# Patient Record
Sex: Female | Born: 1935 | Race: White | Hispanic: No | Marital: Married | Smoking: Never smoker
Health system: Southern US, Community
[De-identification: ages and names within clinical notes are randomized; demographics above are authoritative.]

## PROBLEM LIST (undated history)

## (undated) DIAGNOSIS — H269 Unspecified cataract: Secondary | ICD-10-CM

## (undated) DIAGNOSIS — F32A Depression, unspecified: Secondary | ICD-10-CM

## (undated) DIAGNOSIS — F40240 Claustrophobia: Secondary | ICD-10-CM

## (undated) DIAGNOSIS — M1990 Unspecified osteoarthritis, unspecified site: Secondary | ICD-10-CM

## (undated) DIAGNOSIS — I10 Essential (primary) hypertension: Secondary | ICD-10-CM

## (undated) DIAGNOSIS — E785 Hyperlipidemia, unspecified: Secondary | ICD-10-CM

## (undated) DIAGNOSIS — R112 Nausea with vomiting, unspecified: Secondary | ICD-10-CM

## (undated) DIAGNOSIS — F329 Major depressive disorder, single episode, unspecified: Secondary | ICD-10-CM

## (undated) DIAGNOSIS — H409 Unspecified glaucoma: Secondary | ICD-10-CM

## (undated) DIAGNOSIS — S72009A Fracture of unspecified part of neck of unspecified femur, initial encounter for closed fracture: Secondary | ICD-10-CM

## (undated) DIAGNOSIS — Z9889 Other specified postprocedural states: Secondary | ICD-10-CM

## (undated) DIAGNOSIS — H9190 Unspecified hearing loss, unspecified ear: Secondary | ICD-10-CM

## (undated) DIAGNOSIS — C9000 Multiple myeloma not having achieved remission: Secondary | ICD-10-CM

## (undated) HISTORY — PX: HIP SURGERY: SHX245

## (undated) HISTORY — DX: Fracture of unspecified part of neck of unspecified femur, initial encounter for closed fracture: S72.009A

## (undated) HISTORY — DX: Unspecified cataract: H26.9

## (undated) HISTORY — DX: Multiple myeloma not having achieved remission: C90.00

---

## 1997-01-23 HISTORY — PX: ROTATOR CUFF REPAIR: SHX139

## 2004-04-28 ENCOUNTER — Ambulatory Visit: Payer: Self-pay | Admitting: Internal Medicine

## 2004-08-02 ENCOUNTER — Ambulatory Visit: Payer: Self-pay | Admitting: Unknown Physician Specialty

## 2005-05-01 ENCOUNTER — Ambulatory Visit: Payer: Self-pay | Admitting: Internal Medicine

## 2006-02-07 ENCOUNTER — Ambulatory Visit: Payer: Self-pay | Admitting: Internal Medicine

## 2006-05-15 ENCOUNTER — Ambulatory Visit: Payer: Self-pay | Admitting: Internal Medicine

## 2007-05-16 ENCOUNTER — Ambulatory Visit: Payer: Self-pay | Admitting: Internal Medicine

## 2008-05-18 ENCOUNTER — Ambulatory Visit: Payer: Self-pay | Admitting: Internal Medicine

## 2009-05-19 ENCOUNTER — Ambulatory Visit: Payer: Self-pay | Admitting: Internal Medicine

## 2010-05-25 ENCOUNTER — Ambulatory Visit: Payer: Self-pay | Admitting: Internal Medicine

## 2010-07-04 ENCOUNTER — Ambulatory Visit: Payer: Self-pay | Admitting: Unknown Physician Specialty

## 2012-07-23 DIAGNOSIS — Z8679 Personal history of other diseases of the circulatory system: Secondary | ICD-10-CM | POA: Insufficient documentation

## 2012-08-14 ENCOUNTER — Emergency Department (HOSPITAL_COMMUNITY): Payer: Medicare Other

## 2012-08-14 ENCOUNTER — Encounter (HOSPITAL_COMMUNITY): Payer: Self-pay | Admitting: Radiology

## 2012-08-14 ENCOUNTER — Inpatient Hospital Stay (HOSPITAL_COMMUNITY)
Admission: EM | Admit: 2012-08-14 | Discharge: 2012-08-20 | DRG: 987 | Disposition: A | Discharge status: Rehabilitation Facility | Payer: Medicare Other | Attending: General Surgery | Admitting: General Surgery

## 2012-08-14 DIAGNOSIS — S02109B Fracture of base of skull, unspecified side, initial encounter for open fracture: Secondary | ICD-10-CM

## 2012-08-14 DIAGNOSIS — S069XAA Unspecified intracranial injury with loss of consciousness status unknown, initial encounter: Secondary | ICD-10-CM

## 2012-08-14 DIAGNOSIS — I609 Nontraumatic subarachnoid hemorrhage, unspecified: Secondary | ICD-10-CM | POA: Diagnosis present

## 2012-08-14 DIAGNOSIS — S069X9A Unspecified intracranial injury with loss of consciousness of unspecified duration, initial encounter: Secondary | ICD-10-CM

## 2012-08-14 DIAGNOSIS — W19XXXA Unspecified fall, initial encounter: Secondary | ICD-10-CM

## 2012-08-14 DIAGNOSIS — H409 Unspecified glaucoma: Secondary | ICD-10-CM | POA: Diagnosis present

## 2012-08-14 DIAGNOSIS — IMO0002 Reserved for concepts with insufficient information to code with codable children: Secondary | ICD-10-CM | POA: Diagnosis present

## 2012-08-14 DIAGNOSIS — S0292XA Unspecified fracture of facial bones, initial encounter for closed fracture: Secondary | ICD-10-CM

## 2012-08-14 DIAGNOSIS — J95821 Acute postprocedural respiratory failure: Secondary | ICD-10-CM

## 2012-08-14 DIAGNOSIS — D72829 Elevated white blood cell count, unspecified: Secondary | ICD-10-CM | POA: Diagnosis present

## 2012-08-14 DIAGNOSIS — I619 Nontraumatic intracerebral hemorrhage, unspecified: Secondary | ICD-10-CM | POA: Diagnosis present

## 2012-08-14 DIAGNOSIS — S0180XA Unspecified open wound of other part of head, initial encounter: Secondary | ICD-10-CM

## 2012-08-14 DIAGNOSIS — S0010XA Contusion of unspecified eyelid and periocular area, initial encounter: Secondary | ICD-10-CM | POA: Diagnosis present

## 2012-08-14 DIAGNOSIS — S0280XA Fracture of other specified skull and facial bones, unspecified side, initial encounter for closed fracture: Principal | ICD-10-CM | POA: Diagnosis present

## 2012-08-14 DIAGNOSIS — D62 Acute posthemorrhagic anemia: Secondary | ICD-10-CM | POA: Diagnosis present

## 2012-08-14 DIAGNOSIS — S02109A Fracture of base of skull, unspecified side, initial encounter for closed fracture: Secondary | ICD-10-CM

## 2012-08-14 DIAGNOSIS — S0101XA Laceration without foreign body of scalp, initial encounter: Secondary | ICD-10-CM

## 2012-08-14 DIAGNOSIS — R259 Unspecified abnormal involuntary movements: Secondary | ICD-10-CM | POA: Diagnosis present

## 2012-08-14 DIAGNOSIS — E872 Acidosis: Secondary | ICD-10-CM

## 2012-08-14 DIAGNOSIS — R296 Repeated falls: Secondary | ICD-10-CM | POA: Diagnosis present

## 2012-08-14 DIAGNOSIS — S066X9A Traumatic subarachnoid hemorrhage with loss of consciousness of unspecified duration, initial encounter: Secondary | ICD-10-CM

## 2012-08-14 DIAGNOSIS — J9600 Acute respiratory failure, unspecified whether with hypoxia or hypercapnia: Secondary | ICD-10-CM | POA: Diagnosis not present

## 2012-08-14 DIAGNOSIS — S0292XB Unspecified fracture of facial bones, initial encounter for open fracture: Secondary | ICD-10-CM

## 2012-08-14 DIAGNOSIS — S066XAA Traumatic subarachnoid hemorrhage with loss of consciousness status unknown, initial encounter: Secondary | ICD-10-CM

## 2012-08-14 HISTORY — DX: Hyperlipidemia, unspecified: E78.5

## 2012-08-14 HISTORY — DX: Unspecified glaucoma: H40.9

## 2012-08-14 HISTORY — DX: Depression, unspecified: F32.A

## 2012-08-14 HISTORY — DX: Major depressive disorder, single episode, unspecified: F32.9

## 2012-08-14 HISTORY — DX: Other specified postprocedural states: Z98.890

## 2012-08-14 HISTORY — DX: Nausea with vomiting, unspecified: R11.2

## 2012-08-14 HISTORY — DX: Unspecified osteoarthritis, unspecified site: M19.90

## 2012-08-14 HISTORY — DX: Unspecified hearing loss, unspecified ear: H91.90

## 2012-08-14 HISTORY — DX: Claustrophobia: F40.240

## 2012-08-14 LAB — CBC WITH DIFFERENTIAL/PLATELET
Basophils Relative: 0 % (ref 0–1)
Eosinophils Absolute: 0.1 10*3/uL (ref 0.0–0.7)
Hemoglobin: 11.9 g/dL — ABNORMAL LOW (ref 12.0–15.0)
MCHC: 34.8 g/dL (ref 30.0–36.0)
Monocytes Relative: 7 % (ref 3–12)
Neutro Abs: 18.2 10*3/uL — ABNORMAL HIGH (ref 1.7–7.7)
Neutrophils Relative %: 83 % — ABNORMAL HIGH (ref 43–77)
Platelets: 281 10*3/uL (ref 150–400)
RBC: 3.72 MIL/uL — ABNORMAL LOW (ref 3.87–5.11)

## 2012-08-14 LAB — COMPREHENSIVE METABOLIC PANEL
ALT: 14 U/L (ref 0–35)
AST: 26 U/L (ref 0–37)
Albumin: 3.2 g/dL — ABNORMAL LOW (ref 3.5–5.2)
Alkaline Phosphatase: 45 U/L (ref 39–117)
BUN: 19 mg/dL (ref 6–23)
Chloride: 96 mEq/L (ref 96–112)
Potassium: 3.6 mEq/L (ref 3.5–5.1)
Sodium: 135 mEq/L (ref 135–145)
Total Bilirubin: 0.8 mg/dL (ref 0.3–1.2)
Total Protein: 7.9 g/dL (ref 6.0–8.3)

## 2012-08-14 LAB — POCT I-STAT 3, ART BLOOD GAS (G3+)
Acid-Base Excess: 3 mmol/L — ABNORMAL HIGH (ref 0.0–2.0)
Acid-base deficit: 1 mmol/L (ref 0.0–2.0)
Bicarbonate: 25.5 mEq/L — ABNORMAL HIGH (ref 20.0–24.0)
Bicarbonate: 23.9 mEq/L (ref 20.0–24.0)
TCO2: 26 mmol/L (ref 0–100)
TCO2: 25 mmol/L (ref 0–100)
pH, Arterial: 7.382 (ref 7.350–7.450)
pO2, Arterial: 461 mmHg — ABNORMAL HIGH (ref 80.0–100.0)
pO2, Arterial: 137 mmHg — ABNORMAL HIGH (ref 80.0–100.0)

## 2012-08-14 LAB — POCT I-STAT, CHEM 8
Calcium, Ion: 1.12 mmol/L — ABNORMAL LOW (ref 1.13–1.30)
Glucose, Bld: 145 mg/dL — ABNORMAL HIGH (ref 70–99)
HCT: 36 % (ref 36.0–46.0)
Hemoglobin: 12.2 g/dL (ref 12.0–15.0)

## 2012-08-14 LAB — PROTIME-INR: Prothrombin Time: 14.9 seconds (ref 11.6–15.2)

## 2012-08-14 MED ORDER — LIDOCAINE HCL (CARDIAC) 20 MG/ML IV SOLN
INTRAVENOUS | Status: AC
  Filled 2012-08-14: qty 5

## 2012-08-14 MED ORDER — PANTOPRAZOLE SODIUM 40 MG IV SOLR
40.0000 mg | Freq: Every day | INTRAVENOUS | Status: DC
  Administered 2012-08-14 – 2012-08-16 (×3): 40 mg via INTRAVENOUS
  Filled 2012-08-14 (×5): qty 40

## 2012-08-14 MED ORDER — CHLORHEXIDINE GLUCONATE 0.12 % MT SOLN
15.0000 mL | Freq: Two times a day (BID) | OROMUCOSAL | Status: DC
  Administered 2012-08-15 – 2012-08-16 (×4): 15 mL via OROMUCOSAL
  Filled 2012-08-14 (×3): qty 15

## 2012-08-14 MED ORDER — FENTANYL BOLUS VIA INFUSION
25.0000 ug | Freq: Four times a day (QID) | INTRAVENOUS | Status: DC | PRN
  Filled 2012-08-14: qty 100

## 2012-08-14 MED ORDER — PROPOFOL 10 MG/ML IV EMUL
5.0000 ug/kg/min | INTRAVENOUS | Status: DC
  Administered 2012-08-14: 10 ug/kg/min via INTRAVENOUS

## 2012-08-14 MED ORDER — ONDANSETRON HCL 4 MG PO TABS: 4.0000 mg | ORAL_TABLET | Freq: Four times a day (QID) | ORAL | Status: DC | PRN

## 2012-08-14 MED ORDER — SUCCINYLCHOLINE CHLORIDE 20 MG/ML IJ SOLN
100.0000 mg | Freq: Once | INTRAMUSCULAR | Status: AC
  Administered 2012-08-14: 100 mg via INTRAVENOUS

## 2012-08-14 MED ORDER — SUCCINYLCHOLINE CHLORIDE 20 MG/ML IJ SOLN
INTRAMUSCULAR | Status: AC
  Filled 2012-08-14: qty 1

## 2012-08-14 MED ORDER — LORAZEPAM 2 MG/ML IJ SOLN
0.5000 mg | Freq: Once | INTRAMUSCULAR | Status: AC
  Administered 2012-08-14: 0.5 mg via INTRAVENOUS

## 2012-08-14 MED ORDER — FENTANYL CITRATE 0.05 MG/ML IJ SOLN
50.0000 ug | Freq: Once | INTRAMUSCULAR | Status: AC
  Administered 2012-08-14: 50 ug via INTRAVENOUS

## 2012-08-14 MED ORDER — CEFAZOLIN SODIUM 1-5 GM-% IV SOLN
1.0000 g | Freq: Three times a day (TID) | INTRAVENOUS | Status: DC
  Administered 2012-08-15 – 2012-08-17 (×7): 1 g via INTRAVENOUS
  Filled 2012-08-14 (×9): qty 50

## 2012-08-14 MED ORDER — TETANUS-DIPHTH-ACELL PERTUSSIS 5-2.5-18.5 LF-MCG/0.5 IM SUSP
INTRAMUSCULAR | Status: AC
  Administered 2012-08-14: 0.5 mL via INTRAMUSCULAR
  Filled 2012-08-14: qty 0.5

## 2012-08-14 MED ORDER — PROPOFOL 10 MG/ML IV EMUL: 5.0000 ug/kg/min | INTRAVENOUS | Status: DC

## 2012-08-14 MED ORDER — CHLORHEXIDINE GLUCONATE 0.12 % MT SOLN: 15.0000 mL | Freq: Two times a day (BID) | OROMUCOSAL | Status: DC

## 2012-08-14 MED ORDER — TETANUS-DIPHTHERIA TOXOIDS TD 5-2 LFU IM INJ: 0.5000 mL | INJECTION | Freq: Once | INTRAMUSCULAR | Status: DC

## 2012-08-14 MED ORDER — TETANUS-DIPHTH-ACELL PERTUSSIS 5-2.5-18.5 LF-MCG/0.5 IM SUSP: 0.5000 mL | Freq: Once | INTRAMUSCULAR | Status: AC

## 2012-08-14 MED ORDER — POTASSIUM CHLORIDE IN NACL 20-0.9 MEQ/L-% IV SOLN
INTRAVENOUS | Status: DC
  Administered 2012-08-14 – 2012-08-18 (×8): via INTRAVENOUS
  Filled 2012-08-14 (×9): qty 1000

## 2012-08-14 MED ORDER — IOHEXOL 350 MG/ML SOLN
50.0000 mL | Freq: Once | INTRAVENOUS | Status: AC | PRN
  Administered 2012-08-14: 50 mL via INTRAVENOUS

## 2012-08-14 MED ORDER — ROCURONIUM BROMIDE 50 MG/5ML IV SOLN
INTRAVENOUS | Status: AC
  Filled 2012-08-14: qty 2

## 2012-08-14 MED ORDER — ETOMIDATE 2 MG/ML IV SOLN
INTRAVENOUS | Status: AC
  Filled 2012-08-14: qty 20

## 2012-08-14 MED ORDER — BIOTENE DRY MOUTH MT LIQD
15.0000 mL | Freq: Four times a day (QID) | OROMUCOSAL | Status: DC
  Administered 2012-08-15 – 2012-08-20 (×18): 15 mL via OROMUCOSAL

## 2012-08-14 MED ORDER — PROPOFOL 10 MG/ML IV EMUL
INTRAVENOUS | Status: AC
  Filled 2012-08-14: qty 100

## 2012-08-14 MED ORDER — PROPOFOL 10 MG/ML IV EMUL
5.0000 ug/kg/min | INTRAVENOUS | Status: DC
  Administered 2012-08-15: 10 ug/kg/min via INTRAVENOUS
  Administered 2012-08-15: 35 ug/kg/min via INTRAVENOUS
  Administered 2012-08-16: 20 ug/kg/min via INTRAVENOUS
  Filled 2012-08-14 (×3): qty 100

## 2012-08-14 MED ORDER — FENTANYL CITRATE 0.05 MG/ML IJ SOLN
INTRAMUSCULAR | Status: AC
  Filled 2012-08-14: qty 2

## 2012-08-14 MED ORDER — ONDANSETRON HCL 4 MG/2ML IJ SOLN: 4.0000 mg | Freq: Four times a day (QID) | INTRAMUSCULAR | Status: DC | PRN

## 2012-08-14 MED ORDER — PANTOPRAZOLE SODIUM 40 MG PO TBEC
40.0000 mg | DELAYED_RELEASE_TABLET | Freq: Every day | ORAL | Status: DC
  Administered 2012-08-17: 40 mg via ORAL
  Filled 2012-08-14: qty 1

## 2012-08-14 MED ORDER — CEFAZOLIN SODIUM 1 G IJ SOLR: 1.0000 g | Freq: Once | INTRAMUSCULAR | Status: DC

## 2012-08-14 MED ORDER — ETOMIDATE 2 MG/ML IV SOLN
20.0000 mg | Freq: Once | INTRAVENOUS | Status: AC
  Administered 2012-08-14: 20 mg via INTRAVENOUS

## 2012-08-14 MED ORDER — LORAZEPAM 2 MG/ML IJ SOLN: 0.5000 mg | Freq: Once | INTRAMUSCULAR | Status: DC

## 2012-08-14 MED ORDER — LORAZEPAM 2 MG/ML IJ SOLN
INTRAMUSCULAR | Status: AC
  Filled 2012-08-14: qty 1

## 2012-08-14 MED ORDER — SODIUM CHLORIDE 0.9 % IV SOLN
25.0000 ug/h | INTRAVENOUS | Status: DC
  Administered 2012-08-14: 100 ug/h via INTRAVENOUS
  Filled 2012-08-14 (×2): qty 50

## 2012-08-14 MED ORDER — FENTANYL CITRATE 0.05 MG/ML IJ SOLN
INTRAMUSCULAR | Status: AC
  Administered 2012-08-14: 50 ug
  Filled 2012-08-14: qty 2

## 2012-08-14 MED ORDER — CEFAZOLIN SODIUM 1-5 GM-% IV SOLN
1.0000 g | Freq: Once | INTRAVENOUS | Status: AC
  Administered 2012-08-14: 1 g via INTRAVENOUS
  Filled 2012-08-14: qty 50

## 2012-08-14 MED ORDER — SODIUM CHLORIDE 0.9 % IV SOLN
1000.0000 mg | Freq: Once | INTRAVENOUS | Status: AC
  Administered 2012-08-14: 1000 mg via INTRAVENOUS
  Filled 2012-08-14: qty 10

## 2012-08-14 NOTE — Progress Notes (Signed)
Vent setting changes made following ABG. Tidal volume 480, rate 12, per Lindie Spruce, MD.

## 2012-08-14 NOTE — ED Notes (Addendum)
Patient transported to CT 
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
## 2012-08-14 NOTE — ED Notes (Signed)
Dr Wynetta Emery here to eval pt

## 2012-08-14 NOTE — Progress Notes (Signed)
Patient transported on vent to CT and returned to [facility] without incident.
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
## 2012-08-14 NOTE — ED Notes (Signed)
Family updated on pt's status by Dr. Lindie Spruce
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
## 2012-08-14 NOTE — H&P (Addendum)
History   Theresa Lynch is an 79 y.o. female.   Chief Complaint:  Chief Complaint  Patient presents with  . Fall  . Laceration    Fall This is a new problem. The current episode started today. The problem occurs rarely. The problem has been unchanged.  Laceration Head Injury Location:  Frontal and R parietal Mechanism of injury: fall   Pain details:    Quality:  Unable to specify   Severity:  Unable to specify   Timing:  Unable to specify   Progression:  Unable to specify Chronicity:  New   Patient found down outside of her home, GCS 9-10,   Brought in as non-trauma activation, but should have been activated on arrival.  Patient eventually intubated after CT demonstrated intracerebral hemorrhage.  She was found down in a raven.  Unknown what may have happened.    No past medical history on file.  No past surgical history on file.  No family history on file. Social History:  has no tobacco, alcohol, and drug history on file.  Allergies  Not on File  Home Medications   (Not in a hospital admission)  Trauma Course  Patient seen by trauma surgeon after intubation with GCS of 6-7T.  Moves all fours.  No appropriate.  Bleeding from right frontoparietal laceration and nasal laceration.  Scheduled for CTA of her head because of concerns for aneurysmal bleed.  The trauma surgeon repaired the laceration around her right eye, complex and jagged.  Two layer repair.  Was able to palpate outer table skull fracture through the laceration.   Results for orders placed during the hospital encounter of 08/14/12 (from the past 48 hour(s))  CBC WITH DIFFERENTIAL     Status: Abnormal   Collection Time    08/14/12  7:09 PM      Result Value Range   WBC 22.0 (*) 4.0 - 10.5 K/uL   RBC 3.72 (*) 3.87 - 5.11 MIL/uL   Hemoglobin 11.9 (*) 12.0 - 15.0 g/dL   HCT 40.9 (*) 81.1 - 91.4 %   MCV 91.9  78.0 - 100.0 fL   MCH 32.0  26.0 - 34.0 pg   MCHC 34.8  30.0 - 36.0 g/dL   RDW 78.2  95.6 - 21.3 %    Platelets 281  150 - 400 K/uL   Neutrophils Relative % 83 (*) 43 - 77 %   Neutro Abs 18.2 (*) 1.7 - 7.7 K/uL   Lymphocytes Relative 10 (*) 12 - 46 %   Lymphs Abs 2.1  0.7 - 4.0 K/uL   Monocytes Relative 7  3 - 12 %   Monocytes Absolute 1.6 (*) 0.1 - 1.0 K/uL   Eosinophils Relative 0  0 - 5 %   Eosinophils Absolute 0.1  0.0 - 0.7 K/uL   Basophils Relative 0  0 - 1 %   Basophils Absolute 0.1  0.0 - 0.1 K/uL  COMPREHENSIVE METABOLIC PANEL     Status: Abnormal   Collection Time    08/14/12  7:09 PM      Result Value Range   Sodium 135  135 - 145 mEq/L   Potassium 3.6  3.5 - 5.1 mEq/L   Chloride 96  96 - 112 mEq/L   CO2 19  19 - 32 mEq/L   Glucose, Bld 140 (*) 70 - 99 mg/dL   BUN 19  6 - 23 mg/dL   Creatinine, Ser 0.86  0.50 - 1.10 mg/dL   Calcium 9.6  8.4 - 10.5 mg/dL   Total Protein 7.9  6.0 - 8.3 g/dL   Albumin 3.2 (*) 3.5 - 5.2 g/dL   AST 26  0 - 37 U/L   ALT 14  0 - 35 U/L   Alkaline Phosphatase 45  39 - 117 U/L   Total Bilirubin 0.8  0.3 - 1.2 mg/dL   GFR calc non Af Amer 58 (*) >90 mL/min   GFR calc Af Amer 67 (*) >90 mL/min   Comment:            The eGFR has been calculated     using the CKD EPI equation.     This calculation has not been     validated in all clinical     situations.     eGFR's persistently     <90 mL/min signify     possible Chronic Kidney Disease.  PROTIME-INR     Status: None   Collection Time    08/14/12  7:09 PM      Result Value Range   Prothrombin Time 14.9  11.6 - 15.2 seconds   INR 1.20  0.00 - 1.49   Ct Head Wo Contrast  08/14/2012   *RADIOLOGY REPORT*  Clinical Data:  Fall with laceration.  The patient has been unresponsive.  CT HEAD WITHOUT CONTRAST CT MAXILLOFACIAL WITHOUT CONTRAST CT CERVICAL SPINE WITHOUT CONTRAST  Technique:  Multidetector CT imaging of the head, cervical spine, and maxillofacial structures were performed using the standard protocol without intravenous contrast. Multiplanar CT image reconstructions of the cervical  spine and maxillofacial structures were also generated.  Comparison:   None  CT HEAD  Findings: Extensive subarachnoid hemorrhage is present within the suprasellar cistern and within the right sylvian fissure.  There is blood along the right side the falx anteriorly and along the left tentorium.  A focal area of parenchymal hemorrhage is evident in the left temporal parietal lobe with subarachnoid extension.  This area likely represents a counter coup injury associated with the front right periorbital hematoma and laceration.  A minimally-displaced fracture is suspected along the physis right skull base adjacent to the foraminal sac is.  Please see the report below for additional facial fractures.  IMPRESSION:  1.  Extensive subarachnoid hemorrhage, predominately within the suprasellar cistern and right sylvian fissure.  This may be post- traumatic as the patient's fractures are on the right.  Aneurysmal hemorrhage is not excluded. 2.  The skull base fracture along the right foramen also.  This crosses the right carotid artery. 3.  Right temporal parietal parenchymal hemorrhage represents a 2.5 cm counter coup injury.  There is adjacent subarachnoid hemorrhage.  CT MAXILLOFACIAL  Findings:  A right orbital floor fracture is displaced with extensive extraconal gas about the right orbit.  There is no entrapment of the musculature.  A nondisplaced fracture is present in the posterior wall of the right maxillary sinus.  Fluid levels in the sphenoid sinus measures somewhat high density and likely correspond to occult fractures.  There is a fracture along the right side the clivus which potentially crosses the foramen lacerum.  The mastoid air cells are clear.  Mild circumferential mucosal thickening is present in the left maxillary sinus.  The anterior ethmoid air cells and frontal sinuses demonstrate mucosal thickening.  There is a fluid level in the right frontal sinus.  The posterior wall fractures suspected with a  tiny locule of pneumocephalus.  A large right periorbital hematoma and laceration is present. Radiopaque densities  are evident within the laceration.  The globe is intact.  Extensive subarachnoid hemorrhage is again noted.  IMPRESSION:  1.  Skull base fractures on the right extending through the right side the clivus and foramen lacerum.  Occult fracture is suspected through the right sphenoid sinus. 2.  CTA of the head and neck would be useful for evaluation of carotid artery injury or aneurysm. 3.  Right orbital floor fracture without entrapment of the musculature. 4.  Posterior wall right maxillary sinus fracture. 5.  Nondisplaced fracture through the posterior right frontal sinus with a tiny locule of pneumocephalus. 6.  Large right periorbital hematoma and laceration.  Radiopaque debris is present within the laceration.  CT CERVICAL SPINE  Findings:   Despite imaging twice, the cervical spine images are significantly degraded by patient motion.  Slight degenerative anterolisthesis is present at C3-4.  There is significant loss of disc height at C4-5 and C5-6.  The cervical spine is imaged through T3-4.  No acute fracture or traumatic subluxation is evident. Multilevel facet degenerative changes are worse on the left.  The lung apices demonstrate mild biapical pleural parenchymal scarring.  IMPRESSION:  1.  Moderate spondylosis of the cervical spine without definite fracture. 2.  The images are degraded by patient motion.  Critical Value/emergent results were called by telephone at the time of interpretation on 08/14/2012 at 07:10 p.m. to Dr. Jeraldine Loots, who verbally acknowledged these results.   Original Report Authenticated By: Marin Roberts, M.D.   Ct Cervical Spine Wo Contrast  08/14/2012   *RADIOLOGY REPORT*  Clinical Data:  Fall with laceration.  The patient has been unresponsive.  CT HEAD WITHOUT CONTRAST CT MAXILLOFACIAL WITHOUT CONTRAST CT CERVICAL SPINE WITHOUT CONTRAST  Technique:   Multidetector CT imaging of the head, cervical spine, and maxillofacial structures were performed using the standard protocol without intravenous contrast. Multiplanar CT image reconstructions of the cervical spine and maxillofacial structures were also generated.  Comparison:   None  CT HEAD  Findings: Extensive subarachnoid hemorrhage is present within the suprasellar cistern and within the right sylvian fissure.  There is blood along the right side the falx anteriorly and along the left tentorium.  A focal area of parenchymal hemorrhage is evident in the left temporal parietal lobe with subarachnoid extension.  This area likely represents a counter coup injury associated with the front right periorbital hematoma and laceration.  A minimally-displaced fracture is suspected along the physis right skull base adjacent to the foraminal sac is.  Please see the report below for additional facial fractures.  IMPRESSION:  1.  Extensive subarachnoid hemorrhage, predominately within the suprasellar cistern and right sylvian fissure.  This may be post- traumatic as the patient's fractures are on the right.  Aneurysmal hemorrhage is not excluded. 2.  The skull base fracture along the right foramen also.  This crosses the right carotid artery. 3.  Right temporal parietal parenchymal hemorrhage represents a 2.5 cm counter coup injury.  There is adjacent subarachnoid hemorrhage.  CT MAXILLOFACIAL  Findings:  A right orbital floor fracture is displaced with extensive extraconal gas about the right orbit.  There is no entrapment of the musculature.  A nondisplaced fracture is present in the posterior wall of the right maxillary sinus.  Fluid levels in the sphenoid sinus measures somewhat high density and likely correspond to occult fractures.  There is a fracture along the right side the clivus which potentially crosses the foramen lacerum.  The mastoid air cells are clear.  Mild circumferential mucosal  thickening is present in  the left maxillary sinus.  The anterior ethmoid air cells and frontal sinuses demonstrate mucosal thickening.  There is a fluid level in the right frontal sinus.  The posterior wall fractures suspected with a tiny locule of pneumocephalus.  A large right periorbital hematoma and laceration is present. Radiopaque densities are evident within the laceration.  The globe is intact.  Extensive subarachnoid hemorrhage is again noted.  IMPRESSION:  1.  Skull base fractures on the right extending through the right side the clivus and foramen lacerum.  Occult fracture is suspected through the right sphenoid sinus. 2.  CTA of the head and neck would be useful for evaluation of carotid artery injury or aneurysm. 3.  Right orbital floor fracture without entrapment of the musculature. 4.  Posterior wall right maxillary sinus fracture. 5.  Nondisplaced fracture through the posterior right frontal sinus with a tiny locule of pneumocephalus. 6.  Large right periorbital hematoma and laceration.  Radiopaque debris is present within the laceration.  CT CERVICAL SPINE  Findings:   Despite imaging twice, the cervical spine images are significantly degraded by patient motion.  Slight degenerative anterolisthesis is present at C3-4.  There is significant loss of disc height at C4-5 and C5-6.  The cervical spine is imaged through T3-4.  No acute fracture or traumatic subluxation is evident. Multilevel facet degenerative changes are worse on the left.  The lung apices demonstrate mild biapical pleural parenchymal scarring.  IMPRESSION:  1.  Moderate spondylosis of the cervical spine without definite fracture. 2.  The images are degraded by patient motion.  Critical Value/emergent results were called by telephone at the time of interpretation on 08/14/2012 at 07:10 p.m. to Dr. Jeraldine Loots, who verbally acknowledged these results.   Original Report Authenticated By: Marin Roberts, M.D.   Dg Pelvis Portable  08/14/2012   *RADIOLOGY  REPORT*  Clinical Data: Status post fall  PORTABLE PELVIS  Comparison: None.  Findings: There is no acute fracture or dislocation.  The soft tissues are normal.  IMPRESSION: No acute fracture or dislocation.   Original Report Authenticated By: Sherian Rein, M.D.   Dg Chest Portable 1 View  08/14/2012   *RADIOLOGY REPORT*  Clinical Data: Status post fall with endotracheal tube placement  PORTABLE CHEST - 1 VIEW  Comparison: None.  Findings: Endotracheal tube is seen in good position.  There is no pneumothorax.  There is no focal infiltrate, pulmonary edema, or pleural effusion.  Mediastinal contour and cardiac silhouette are normal.  There is no pneumothorax.  No acute abnormality is identified the visualized bones.  IMPRESSION: Endotracheal tube in good position.  There is no pneumothorax.  The lungs are clear.   Original Report Authenticated By: Sherian Rein, M.D.   Ct Maxillofacial Wo Cm  08/14/2012   *RADIOLOGY REPORT*  Clinical Data:  Fall with laceration.  The patient has been unresponsive.  CT HEAD WITHOUT CONTRAST CT MAXILLOFACIAL WITHOUT CONTRAST CT CERVICAL SPINE WITHOUT CONTRAST  Technique:  Multidetector CT imaging of the head, cervical spine, and maxillofacial structures were performed using the standard protocol without intravenous contrast. Multiplanar CT image reconstructions of the cervical spine and maxillofacial structures were also generated.  Comparison:   None  CT HEAD  Findings: Extensive subarachnoid hemorrhage is present within the suprasellar cistern and within the right sylvian fissure.  There is blood along the right side the falx anteriorly and along the left tentorium.  A focal area of parenchymal hemorrhage is evident in the left temporal parietal  lobe with subarachnoid extension.  This area likely represents a counter coup injury associated with the front right periorbital hematoma and laceration.  A minimally-displaced fracture is suspected along the physis right skull base  adjacent to the foraminal sac is.  Please see the report below for additional facial fractures.  IMPRESSION:  1.  Extensive subarachnoid hemorrhage, predominately within the suprasellar cistern and right sylvian fissure.  This may be post- traumatic as the patient's fractures are on the right.  Aneurysmal hemorrhage is not excluded. 2.  The skull base fracture along the right foramen also.  This crosses the right carotid artery. 3.  Right temporal parietal parenchymal hemorrhage represents a 2.5 cm counter coup injury.  There is adjacent subarachnoid hemorrhage.  CT MAXILLOFACIAL  Findings:  A right orbital floor fracture is displaced with extensive extraconal gas about the right orbit.  There is no entrapment of the musculature.  A nondisplaced fracture is present in the posterior wall of the right maxillary sinus.  Fluid levels in the sphenoid sinus measures somewhat high density and likely correspond to occult fractures.  There is a fracture along the right side the clivus which potentially crosses the foramen lacerum.  The mastoid air cells are clear.  Mild circumferential mucosal thickening is present in the left maxillary sinus.  The anterior ethmoid air cells and frontal sinuses demonstrate mucosal thickening.  There is a fluid level in the right frontal sinus.  The posterior wall fractures suspected with a tiny locule of pneumocephalus.  A large right periorbital hematoma and laceration is present. Radiopaque densities are evident within the laceration.  The globe is intact.  Extensive subarachnoid hemorrhage is again noted.  IMPRESSION:  1.  Skull base fractures on the right extending through the right side the clivus and foramen lacerum.  Occult fracture is suspected through the right sphenoid sinus. 2.  CTA of the head and neck would be useful for evaluation of carotid artery injury or aneurysm. 3.  Right orbital floor fracture without entrapment of the musculature. 4.  Posterior wall right maxillary  sinus fracture. 5.  Nondisplaced fracture through the posterior right frontal sinus with a tiny locule of pneumocephalus. 6.  Large right periorbital hematoma and laceration.  Radiopaque debris is present within the laceration.  CT CERVICAL SPINE  Findings:   Despite imaging twice, the cervical spine images are significantly degraded by patient motion.  Slight degenerative anterolisthesis is present at C3-4.  There is significant loss of disc height at C4-5 and C5-6.  The cervical spine is imaged through T3-4.  No acute fracture or traumatic subluxation is evident. Multilevel facet degenerative changes are worse on the left.  The lung apices demonstrate mild biapical pleural parenchymal scarring.  IMPRESSION:  1.  Moderate spondylosis of the cervical spine without definite fracture. 2.  The images are degraded by patient motion.  Critical Value/emergent results were called by telephone at the time of interpretation on 08/14/2012 at 07:10 p.m. to Dr. Jeraldine Loots, who verbally acknowledged these results.   Original Report Authenticated By: Marin Roberts, M.D.    Review of Systems  Unable to perform ROS: intubated    Blood pressure 156/75, pulse 78, resp. rate 16, height 5\' 5"  (1.651 m), weight 70.001 kg (154 lb 5.2 oz), SpO2 100.00%. Physical Exam  Constitutional: She appears well-developed and well-nourished.  HENT:  Head: Head is with raccoon's eyes, with abrasion, with contusion and with laceration.    Eyes: Conjunctivae and EOM are normal. Pupils are equal, round, and  reactive to light.  Neck: Normal range of motion.  Cardiovascular: Normal rate, regular rhythm and normal heart sounds.   Respiratory: Effort normal and breath sounds normal.  GI: Soft. Bowel sounds are normal. She exhibits no distension. There is no tenderness (none apparent). There is no rebound and no guarding.  Musculoskeletal: Normal range of motion.  Neurological: GCS eye subscore is 1. GCS verbal subscore is 1. GCS motor  subscore is 4.  Reflex Scores:      Patellar reflexes are 4+ on the right side and 4+ on the left side.    Assessment/Plan Apparent ground level fall for unknown reasons. CTA being done to rule out aneurysmal bleeding. This appears to be all trauma related intracerebral bleeding, but this patient should have been Level I activation on arrival or after intubation.  She was never activated.  She is hemodynamically stable. Right maxillary sinus fracture Right orbital fracture.  Will admit to NICU Adjust ventilator according to her ABGs Repair her laceration (done).  If okay with neurosurgery, will wean to extubate in the AM if able to do so.  Does not appear as thought the patient has elevated intracerebral pressures.  By report the neurosurgeon has seen the patient.  Maxillo-facial trauma has been contacted and will see the patient in the AM  Cherylynn Ridges 08/14/2012, 8:25 PM  65 minutes of critical care time was spent with this patient managing her ventilator, sedation, and discussing with the family and the neurosurgeon.   Procedures

## 2012-08-14 NOTE — Progress Notes (Signed)
Patient transported on vent to room 3102 without incident.

## 2012-08-15 ENCOUNTER — Inpatient Hospital Stay (HOSPITAL_COMMUNITY): Payer: Medicare Other

## 2012-08-15 DIAGNOSIS — I619 Nontraumatic intracerebral hemorrhage, unspecified: Secondary | ICD-10-CM | POA: Diagnosis present

## 2012-08-15 DIAGNOSIS — S0292XA Unspecified fracture of facial bones, initial encounter for closed fracture: Secondary | ICD-10-CM

## 2012-08-15 DIAGNOSIS — S066X9A Traumatic subarachnoid hemorrhage with loss of consciousness of unspecified duration, initial encounter: Secondary | ICD-10-CM

## 2012-08-15 DIAGNOSIS — I609 Nontraumatic subarachnoid hemorrhage, unspecified: Secondary | ICD-10-CM | POA: Diagnosis present

## 2012-08-15 LAB — CBC WITH DIFFERENTIAL/PLATELET
Basophils Absolute: 0 10*3/uL (ref 0.0–0.1)
Eosinophils Relative: 1 % (ref 0–5)
HCT: 26.8 % — ABNORMAL LOW (ref 36.0–46.0)
Hemoglobin: 9 g/dL — ABNORMAL LOW (ref 12.0–15.0)
Lymphocytes Relative: 10 % — ABNORMAL LOW (ref 12–46)
MCV: 93.4 fL (ref 78.0–100.0)
Monocytes Absolute: 1.2 10*3/uL — ABNORMAL HIGH (ref 0.1–1.0)
Monocytes Relative: 9 % (ref 3–12)
Neutro Abs: 9.8 10*3/uL — ABNORMAL HIGH (ref 1.7–7.7)
RDW: 14.1 % (ref 11.5–15.5)
WBC: 12.4 10*3/uL — ABNORMAL HIGH (ref 4.0–10.5)

## 2012-08-15 LAB — MRSA PCR SCREENING: MRSA by PCR: NEGATIVE

## 2012-08-15 LAB — CBC
HCT: 28.8 % — ABNORMAL LOW (ref 36.0–46.0)
Hemoglobin: 9.6 g/dL — ABNORMAL LOW (ref 12.0–15.0)
MCH: 31.4 pg (ref 26.0–34.0)
MCHC: 33.3 g/dL (ref 30.0–36.0)
MCV: 94.1 fL (ref 78.0–100.0)
RBC: 3.06 MIL/uL — ABNORMAL LOW (ref 3.87–5.11)

## 2012-08-15 LAB — BASIC METABOLIC PANEL
BUN: 18 mg/dL (ref 6–23)
CO2: 28 mEq/L (ref 19–32)
Calcium: 9.1 mg/dL (ref 8.4–10.5)
GFR calc non Af Amer: 54 mL/min — ABNORMAL LOW (ref >90)
Glucose, Bld: 115 mg/dL — ABNORMAL HIGH (ref 70–99)

## 2012-08-15 NOTE — Progress Notes (Signed)
Subjective: Patient reports Intubated unable to communicate  Objective: Vital signs in last 24 hours: Temp:  [97.1 F (36.2 C)-99.5 F (37.5 C)] 98.8 F (37.1 C) (07/24 0700) Pulse Rate:  [61-125] 74 (07/24 0700) Resp:  [12-30] 18 (07/24 0700) BP: (75-182)/(33-136) 139/45 mmHg (07/24 0700) SpO2:  [83 %-100 %] 100 % (07/24 0700) FiO2 (%):  [40 %-100 %] 40 % (07/24 0700) Weight:  [64.9 kg (143 lb 1.3 oz)-75 kg (165 lb 5.5 oz)] 64.9 kg (143 lb 1.3 oz) (07/23 2245)  Intake/Output from previous day: 07/23 0701 - 07/24 0700 In: 961.7 [I.V.:911.7; IV Piggyback:50] Out: 485 [Urine:485] Intake/Output this shift:    Patient is awake opens her eyes to voice she moves all extremities well symmetrically and purposefully however she does not follow commands  Lab Results:  Recent Labs  08/14/12 1909 08/14/12 1918  WBC 22.0*  --   HGB 11.9* 12.2  HCT 34.2* 36.0  PLT 281  --    BMET  Recent Labs  08/14/12 1909 08/14/12 1918 08/15/12 0545  NA 135 139 138  K 3.6 3.5 3.6  CL 96 103 101  CO2 19  --  28  GLUCOSE 140* 145* 115*  BUN 19 19 18   CREATININE 0.91 1.10 0.96  CALCIUM 9.6  --  9.1    Studies/Results: Ct Angio Head W/cm &/or Wo Cm  08/14/2012   *RADIOLOGY REPORT*  Clinical Data:  Fall.  Subarachnoid hemorrhage.  Intraparenchymal hemorrhage.  Assess for aneurysmal cause.  CT ANGIOGRAPHY HEAD AND NECK  Technique:  Multidetector CT imaging of the head and neck was performed using the standard protocol during bolus administration of intravenous contrast.  Multiplanar CT image reconstructions including MIPs were obtained to evaluate the vascular anatomy. Carotid stenosis measurements (when applicable) are obtained utilizing NASCET criteria, using the distal internal carotid diameter as the denominator.  Contrast: 50mL OMNIPAQUE IOHEXOL 350 MG/ML SOLN  Comparison:  Head CT same day  CTA NECK  Findings:  There is mild dependent pulmonary atelectasis.  There is scarring at the lung  apices.  The branching pattern of the brachiocephalic vessels from the arch is normal.  No origin stenosis.  Both common carotid arteries are widely patent to the bifurcation regions.  On the right, there is calcific plaque at the bifurcation but there is no stenosis of the ICA.  On the left, there is calcific plaque at the bifurcation but again no measurable stenosis of the ICA compared to the more distal cervical ICA diameter.  Both vertebral artery origins are widely patent.  Both vertebral arteries are widely patent through the cervical region with the right being slightly larger than the left.   Review of the MIP images confirms the above findings.  IMPRESSION: Some atherosclerosis at the carotid bifurcations bilaterally but no measurable stenosis on either side.  CTA HEAD  Findings:  Both internal carotid arteries are widely patent through the siphon regions.  There is mild atherosclerotic irregularity but no stenosis.  The anterior and middle cerebral vessels are patent without proximal stenosis, aneurysm or vascular malformation.  Both vertebral arteries are patent to the basilar.  No basilar stenosis.  Posterior circulation branch vessels appear intact.  As seen at parenchymal imaging, there are skull base fractures, subarachnoid hemorrhage and indent parenchymal hematoma in the left temporo-parietal junction region.  The hematoma is no larger, again measuring 3 cm in maximal diameter.   Review of the MIP images confirms the above findings.  IMPRESSION: No evidence of intracranial aneurysm  or vascular malformation.  Mild atherosclerotic irregularity without significant stenosis or major vessel occlusion.   Original Report Authenticated By: Paulina Fusi, M.D.   Ct Head Wo Contrast  08/14/2012   *RADIOLOGY REPORT*  Clinical Data:  Fall with laceration.  The patient has been unresponsive.  CT HEAD WITHOUT CONTRAST CT MAXILLOFACIAL WITHOUT CONTRAST CT CERVICAL SPINE WITHOUT CONTRAST  Technique:  Multidetector  CT imaging of the head, cervical spine, and maxillofacial structures were performed using the standard protocol without intravenous contrast. Multiplanar CT image reconstructions of the cervical spine and maxillofacial structures were also generated.  Comparison:   None  CT HEAD  Findings: Extensive subarachnoid hemorrhage is present within the suprasellar cistern and within the right sylvian fissure.  There is blood along the right side the falx anteriorly and along the left tentorium.  A focal area of parenchymal hemorrhage is evident in the left temporal parietal lobe with subarachnoid extension.  This area likely represents a counter coup injury associated with the front right periorbital hematoma and laceration.  A minimally-displaced fracture is suspected along the physis right skull base adjacent to the foraminal sac is.  Please see the report below for additional facial fractures.  IMPRESSION:  1.  Extensive subarachnoid hemorrhage, predominately within the suprasellar cistern and right sylvian fissure.  This may be post- traumatic as the patient's fractures are on the right.  Aneurysmal hemorrhage is not excluded. 2.  The skull base fracture along the right foramen also.  This crosses the right carotid artery. 3.  Right temporal parietal parenchymal hemorrhage represents a 2.5 cm counter coup injury.  There is adjacent subarachnoid hemorrhage.  CT MAXILLOFACIAL  Findings:  A right orbital floor fracture is displaced with extensive extraconal gas about the right orbit.  There is no entrapment of the musculature.  A nondisplaced fracture is present in the posterior wall of the right maxillary sinus.  Fluid levels in the sphenoid sinus measures somewhat high density and likely correspond to occult fractures.  There is a fracture along the right side the clivus which potentially crosses the foramen lacerum.  The mastoid air cells are clear.  Mild circumferential mucosal thickening is present in the left  maxillary sinus.  The anterior ethmoid air cells and frontal sinuses demonstrate mucosal thickening.  There is a fluid level in the right frontal sinus.  The posterior wall fractures suspected with a tiny locule of pneumocephalus.  A large right periorbital hematoma and laceration is present. Radiopaque densities are evident within the laceration.  The globe is intact.  Extensive subarachnoid hemorrhage is again noted.  IMPRESSION:  1.  Skull base fractures on the right extending through the right side the clivus and foramen lacerum.  Occult fracture is suspected through the right sphenoid sinus. 2.  CTA of the head and neck would be useful for evaluation of carotid artery injury or aneurysm. 3.  Right orbital floor fracture without entrapment of the musculature. 4.  Posterior wall right maxillary sinus fracture. 5.  Nondisplaced fracture through the posterior right frontal sinus with a tiny locule of pneumocephalus. 6.  Large right periorbital hematoma and laceration.  Radiopaque debris is present within the laceration.  CT CERVICAL SPINE  Findings:   Despite imaging twice, the cervical spine images are significantly degraded by patient motion.  Slight degenerative anterolisthesis is present at C3-4.  There is significant loss of disc height at C4-5 and C5-6.  The cervical spine is imaged through T3-4.  No acute fracture or traumatic subluxation is  evident. Multilevel facet degenerative changes are worse on the left.  The lung apices demonstrate mild biapical pleural parenchymal scarring.  IMPRESSION:  1.  Moderate spondylosis of the cervical spine without definite fracture. 2.  The images are degraded by patient motion.  Critical Value/emergent results were called by telephone at the time of interpretation on 08/14/2012 at 07:10 p.m. to Dr. Jeraldine Loots, who verbally acknowledged these results.   Original Report Authenticated By: Marin Roberts, M.D.   Ct Angio Neck W/cm &/or Wo/cm  08/14/2012   *RADIOLOGY  REPORT*  Clinical Data:  Fall.  Subarachnoid hemorrhage.  Intraparenchymal hemorrhage.  Assess for aneurysmal cause.  CT ANGIOGRAPHY HEAD AND NECK  Technique:  Multidetector CT imaging of the head and neck was performed using the standard protocol during bolus administration of intravenous contrast.  Multiplanar CT image reconstructions including MIPs were obtained to evaluate the vascular anatomy. Carotid stenosis measurements (when applicable) are obtained utilizing NASCET criteria, using the distal internal carotid diameter as the denominator.  Contrast: 50mL OMNIPAQUE IOHEXOL 350 MG/ML SOLN  Comparison:  Head CT same day  CTA NECK  Findings:  There is mild dependent pulmonary atelectasis.  There is scarring at the lung apices.  The branching pattern of the brachiocephalic vessels from the arch is normal.  No origin stenosis.  Both common carotid arteries are widely patent to the bifurcation regions.  On the right, there is calcific plaque at the bifurcation but there is no stenosis of the ICA.  On the left, there is calcific plaque at the bifurcation but again no measurable stenosis of the ICA compared to the more distal cervical ICA diameter.  Both vertebral artery origins are widely patent.  Both vertebral arteries are widely patent through the cervical region with the right being slightly larger than the left.   Review of the MIP images confirms the above findings.  IMPRESSION: Some atherosclerosis at the carotid bifurcations bilaterally but no measurable stenosis on either side.  CTA HEAD  Findings:  Both internal carotid arteries are widely patent through the siphon regions.  There is mild atherosclerotic irregularity but no stenosis.  The anterior and middle cerebral vessels are patent without proximal stenosis, aneurysm or vascular malformation.  Both vertebral arteries are patent to the basilar.  No basilar stenosis.  Posterior circulation branch vessels appear intact.  As seen at parenchymal imaging,  there are skull base fractures, subarachnoid hemorrhage and indent parenchymal hematoma in the left temporo-parietal junction region.  The hematoma is no larger, again measuring 3 cm in maximal diameter.   Review of the MIP images confirms the above findings.  IMPRESSION: No evidence of intracranial aneurysm or vascular malformation.  Mild atherosclerotic irregularity without significant stenosis or major vessel occlusion.   Original Report Authenticated By: Paulina Fusi, M.D.   Ct Cervical Spine Wo Contrast  08/14/2012   *RADIOLOGY REPORT*  Clinical Data:  Fall with laceration.  The patient has been unresponsive.  CT HEAD WITHOUT CONTRAST CT MAXILLOFACIAL WITHOUT CONTRAST CT CERVICAL SPINE WITHOUT CONTRAST  Technique:  Multidetector CT imaging of the head, cervical spine, and maxillofacial structures were performed using the standard protocol without intravenous contrast. Multiplanar CT image reconstructions of the cervical spine and maxillofacial structures were also generated.  Comparison:   None  CT HEAD  Findings: Extensive subarachnoid hemorrhage is present within the suprasellar cistern and within the right sylvian fissure.  There is blood along the right side the falx anteriorly and along the left tentorium.  A focal area of parenchymal  hemorrhage is evident in the left temporal parietal lobe with subarachnoid extension.  This area likely represents a counter coup injury associated with the front right periorbital hematoma and laceration.  A minimally-displaced fracture is suspected along the physis right skull base adjacent to the foraminal sac is.  Please see the report below for additional facial fractures.  IMPRESSION:  1.  Extensive subarachnoid hemorrhage, predominately within the suprasellar cistern and right sylvian fissure.  This may be post- traumatic as the patient's fractures are on the right.  Aneurysmal hemorrhage is not excluded. 2.  The skull base fracture along the right foramen also.   This crosses the right carotid artery. 3.  Right temporal parietal parenchymal hemorrhage represents a 2.5 cm counter coup injury.  There is adjacent subarachnoid hemorrhage.  CT MAXILLOFACIAL  Findings:  A right orbital floor fracture is displaced with extensive extraconal gas about the right orbit.  There is no entrapment of the musculature.  A nondisplaced fracture is present in the posterior wall of the right maxillary sinus.  Fluid levels in the sphenoid sinus measures somewhat high density and likely correspond to occult fractures.  There is a fracture along the right side the clivus which potentially crosses the foramen lacerum.  The mastoid air cells are clear.  Mild circumferential mucosal thickening is present in the left maxillary sinus.  The anterior ethmoid air cells and frontal sinuses demonstrate mucosal thickening.  There is a fluid level in the right frontal sinus.  The posterior wall fractures suspected with a tiny locule of pneumocephalus.  A large right periorbital hematoma and laceration is present. Radiopaque densities are evident within the laceration.  The globe is intact.  Extensive subarachnoid hemorrhage is again noted.  IMPRESSION:  1.  Skull base fractures on the right extending through the right side the clivus and foramen lacerum.  Occult fracture is suspected through the right sphenoid sinus. 2.  CTA of the head and neck would be useful for evaluation of carotid artery injury or aneurysm. 3.  Right orbital floor fracture without entrapment of the musculature. 4.  Posterior wall right maxillary sinus fracture. 5.  Nondisplaced fracture through the posterior right frontal sinus with a tiny locule of pneumocephalus. 6.  Large right periorbital hematoma and laceration.  Radiopaque debris is present within the laceration.  CT CERVICAL SPINE  Findings:   Despite imaging twice, the cervical spine images are significantly degraded by patient motion.  Slight degenerative anterolisthesis is  present at C3-4.  There is significant loss of disc height at C4-5 and C5-6.  The cervical spine is imaged through T3-4.  No acute fracture or traumatic subluxation is evident. Multilevel facet degenerative changes are worse on the left.  The lung apices demonstrate mild biapical pleural parenchymal scarring.  IMPRESSION:  1.  Moderate spondylosis of the cervical spine without definite fracture. 2.  The images are degraded by patient motion.  Critical Value/emergent results were called by telephone at the time of interpretation on 08/14/2012 at 07:10 p.m. to Dr. Jeraldine Loots, who verbally acknowledged these results.   Original Report Authenticated By: Marin Roberts, M.D.   Dg Pelvis Portable  08/14/2012   *RADIOLOGY REPORT*  Clinical Data: Status post fall  PORTABLE PELVIS  Comparison: None.  Findings: There is no acute fracture or dislocation.  The soft tissues are normal.  IMPRESSION: No acute fracture or dislocation.   Original Report Authenticated By: Sherian Rein, M.D.   Dg Chest Portable 1 View  08/14/2012   *RADIOLOGY REPORT*  Clinical Data: Status post fall with endotracheal tube placement  PORTABLE CHEST - 1 VIEW  Comparison: None.  Findings: Endotracheal tube is seen in good position.  There is no pneumothorax.  There is no focal infiltrate, pulmonary edema, or pleural effusion.  Mediastinal contour and cardiac silhouette are normal.  There is no pneumothorax.  No acute abnormality is identified the visualized bones.  IMPRESSION: Endotracheal tube in good position.  There is no pneumothorax.  The lungs are clear.   Original Report Authenticated By: Sherian Rein, M.D.   Ct Maxillofacial Wo Cm  08/14/2012   *RADIOLOGY REPORT*  Clinical Data:  Fall with laceration.  The patient has been unresponsive.  CT HEAD WITHOUT CONTRAST CT MAXILLOFACIAL WITHOUT CONTRAST CT CERVICAL SPINE WITHOUT CONTRAST  Technique:  Multidetector CT imaging of the head, cervical spine, and maxillofacial structures were  performed using the standard protocol without intravenous contrast. Multiplanar CT image reconstructions of the cervical spine and maxillofacial structures were also generated.  Comparison:   None  CT HEAD  Findings: Extensive subarachnoid hemorrhage is present within the suprasellar cistern and within the right sylvian fissure.  There is blood along the right side the falx anteriorly and along the left tentorium.  A focal area of parenchymal hemorrhage is evident in the left temporal parietal lobe with subarachnoid extension.  This area likely represents a counter coup injury associated with the front right periorbital hematoma and laceration.  A minimally-displaced fracture is suspected along the physis right skull base adjacent to the foraminal sac is.  Please see the report below for additional facial fractures.  IMPRESSION:  1.  Extensive subarachnoid hemorrhage, predominately within the suprasellar cistern and right sylvian fissure.  This may be post- traumatic as the patient's fractures are on the right.  Aneurysmal hemorrhage is not excluded. 2.  The skull base fracture along the right foramen also.  This crosses the right carotid artery. 3.  Right temporal parietal parenchymal hemorrhage represents a 2.5 cm counter coup injury.  There is adjacent subarachnoid hemorrhage.  CT MAXILLOFACIAL  Findings:  A right orbital floor fracture is displaced with extensive extraconal gas about the right orbit.  There is no entrapment of the musculature.  A nondisplaced fracture is present in the posterior wall of the right maxillary sinus.  Fluid levels in the sphenoid sinus measures somewhat high density and likely correspond to occult fractures.  There is a fracture along the right side the clivus which potentially crosses the foramen lacerum.  The mastoid air cells are clear.  Mild circumferential mucosal thickening is present in the left maxillary sinus.  The anterior ethmoid air cells and frontal sinuses demonstrate  mucosal thickening.  There is a fluid level in the right frontal sinus.  The posterior wall fractures suspected with a tiny locule of pneumocephalus.  A large right periorbital hematoma and laceration is present. Radiopaque densities are evident within the laceration.  The globe is intact.  Extensive subarachnoid hemorrhage is again noted.  IMPRESSION:  1.  Skull base fractures on the right extending through the right side the clivus and foramen lacerum.  Occult fracture is suspected through the right sphenoid sinus. 2.  CTA of the head and neck would be useful for evaluation of carotid artery injury or aneurysm. 3.  Right orbital floor fracture without entrapment of the musculature. 4.  Posterior wall right maxillary sinus fracture. 5.  Nondisplaced fracture through the posterior right frontal sinus with a tiny locule of pneumocephalus. 6.  Large right periorbital  hematoma and laceration.  Radiopaque debris is present within the laceration.  CT CERVICAL SPINE  Findings:   Despite imaging twice, the cervical spine images are significantly degraded by patient motion.  Slight degenerative anterolisthesis is present at C3-4.  There is significant loss of disc height at C4-5 and C5-6.  The cervical spine is imaged through T3-4.  No acute fracture or traumatic subluxation is evident. Multilevel facet degenerative changes are worse on the left.  The lung apices demonstrate mild biapical pleural parenchymal scarring.  IMPRESSION:  1.  Moderate spondylosis of the cervical spine without definite fracture. 2.  The images are degraded by patient motion.  Critical Value/emergent results were called by telephone at the time of interpretation on 08/14/2012 at 07:10 p.m. to Dr. Jeraldine Loots, who verbally acknowledged these results.   Original Report Authenticated By: Marin Roberts, M.D.    Assessment/Plan: Post injury day 1 traumatic subarachnoid hemorrhage appears to be becoming more responsive recommend continued wean off  the ventilator continue to watch neurologic exam. CTA was negative this appears to be primarily traumatic subarachnoid hemorrhage with contrecoup contusion recommend followup CT tomorrow  LOS: 1 day     Cecelia Graciano P 08/15/2012, 7:18 AM

## 2012-08-15 NOTE — Progress Notes (Signed)
UR completed 
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
## 2012-08-15 NOTE — Progress Notes (Signed)
Casimiro Needle PA notified that pt has maintained urine output of 25ml/hr averaged today. No new orders received. Will continue to monitor.

## 2012-08-16 ENCOUNTER — Inpatient Hospital Stay (HOSPITAL_COMMUNITY): Payer: Medicare Other

## 2012-08-16 ENCOUNTER — Encounter: Payer: Self-pay | Admitting: Oral and Maxillofacial Surgery

## 2012-08-16 DIAGNOSIS — E876 Hypokalemia: Secondary | ICD-10-CM

## 2012-08-16 DIAGNOSIS — D62 Acute posthemorrhagic anemia: Secondary | ICD-10-CM

## 2012-08-16 LAB — BASIC METABOLIC PANEL
BUN: 16 mg/dL (ref 6–23)
Calcium: 8.6 mg/dL (ref 8.4–10.5)
Creatinine, Ser: 0.78 mg/dL (ref 0.50–1.10)
GFR calc Af Amer: 88 mL/min — ABNORMAL LOW (ref >90)
GFR calc non Af Amer: 76 mL/min — ABNORMAL LOW (ref >90)
Glucose, Bld: 107 mg/dL — ABNORMAL HIGH (ref 70–99)

## 2012-08-16 LAB — POCT I-STAT 3, ART BLOOD GAS (G3+)
Bicarbonate: 21.7 meq/L (ref 20.0–24.0)
TCO2: 23 mmol/L (ref 0–100)
pH, Arterial: 7.29 — ABNORMAL LOW (ref 7.350–7.450)
pO2, Arterial: 133 mmHg — ABNORMAL HIGH (ref 80.0–100.0)

## 2012-08-16 LAB — CBC
HCT: 25 % — ABNORMAL LOW (ref 36.0–46.0)
Hemoglobin: 8.3 g/dL — ABNORMAL LOW (ref 12.0–15.0)
MCH: 31.3 pg (ref 26.0–34.0)
MCHC: 33.2 g/dL (ref 30.0–36.0)
MCV: 94.3 fL (ref 78.0–100.0)
RDW: 14.3 % (ref 11.5–15.5)

## 2012-08-16 LAB — GLUCOSE, CAPILLARY

## 2012-08-16 MED ORDER — SODIUM CHLORIDE 0.9 % IV SOLN
500.0000 mL | Freq: Once | INTRAVENOUS | Status: AC
  Administered 2012-08-16: 500 mL via INTRAVENOUS

## 2012-08-16 MED ORDER — MIDAZOLAM HCL 2 MG/2ML IJ SOLN: 2.0000 mg | INTRAMUSCULAR | Status: DC | PRN

## 2012-08-16 MED ORDER — FENTANYL CITRATE 0.05 MG/ML IJ SOLN: 25.0000 ug | INTRAMUSCULAR | Status: DC | PRN

## 2012-08-16 MED ORDER — POTASSIUM CHLORIDE 20 MEQ/15ML (10%) PO LIQD
20.0000 meq | Freq: Two times a day (BID) | ORAL | Status: DC
  Administered 2012-08-16 – 2012-08-17 (×3): 20 meq
  Filled 2012-08-16: qty 30
  Filled 2012-08-16 (×4): qty 15

## 2012-08-16 MED ORDER — DEXMEDETOMIDINE HCL IN NACL 200 MCG/50ML IV SOLN
0.2000 ug/kg/h | INTRAVENOUS | Status: DC
  Administered 2012-08-16: 0.2 ug/kg/h via INTRAVENOUS
  Administered 2012-08-16 (×3): 0.8 ug/kg/h via INTRAVENOUS
  Administered 2012-08-17 (×2): 1.2 ug/kg/h via INTRAVENOUS
  Filled 2012-08-16 (×7): qty 50

## 2012-08-16 NOTE — Progress Notes (Signed)
Subjective: Patient reports Remains intubated  Objective: Vital signs in last 24 hours: Temp:  [98.1 F (36.7 C)-99.5 F (37.5 C)] 99.5 F (37.5 C) (07/25 0600) Pulse Rate:  [64-91] 64 (07/25 0600) Resp:  [12-21] 12 (07/25 0600) BP: (91-151)/(40-58) 107/46 mmHg (07/25 0600) SpO2:  [99 %-100 %] 99 % (07/25 0600) FiO2 (%):  [40 %] 40 % (07/25 0600)  Intake/Output from previous day: 07/24 0701 - 07/25 0700 In: 2022.1 [I.V.:1922.1; IV Piggyback:100] Out: 655 [Urine:505; Emesis/NG output:150] Intake/Output this shift:    Pupils are equal and reactive moves while he purposefully with stimulation remains on sedation  Lab Results:  Recent Labs  08/15/12 1205 08/16/12 0335  WBC 12.4* 9.3  HGB 9.0* 8.3*  HCT 26.8* 25.0*  PLT 200 168   BMET  Recent Labs  08/15/12 0545 08/16/12 0335  NA 138 138  K 3.6 3.1*  CL 101 105  CO2 28 26  GLUCOSE 115* 107*  BUN 18 16  CREATININE 0.96 0.78  CALCIUM 9.1 8.6    Studies/Results: Ct Angio Head W/cm &/or Wo Cm  08/14/2012   *RADIOLOGY REPORT*  Clinical Data:  Fall.  Subarachnoid hemorrhage.  Intraparenchymal hemorrhage.  Assess for aneurysmal cause.  CT ANGIOGRAPHY HEAD AND NECK  Technique:  Multidetector CT imaging of the head and neck was performed using the standard protocol during bolus administration of intravenous contrast.  Multiplanar CT image reconstructions including MIPs were obtained to evaluate the vascular anatomy. Carotid stenosis measurements (when applicable) are obtained utilizing NASCET criteria, using the distal internal carotid diameter as the denominator.  Contrast: 50mL OMNIPAQUE IOHEXOL 350 MG/ML SOLN  Comparison:  Head CT same day  CTA NECK  Findings:  There is mild dependent pulmonary atelectasis.  There is scarring at the lung apices.  The branching pattern of the brachiocephalic vessels from the arch is normal.  No origin stenosis.  Both common carotid arteries are widely patent to the bifurcation regions.  On  the right, there is calcific plaque at the bifurcation but there is no stenosis of the ICA.  On the left, there is calcific plaque at the bifurcation but again no measurable stenosis of the ICA compared to the more distal cervical ICA diameter.  Both vertebral artery origins are widely patent.  Both vertebral arteries are widely patent through the cervical region with the right being slightly larger than the left.   Review of the MIP images confirms the above findings.  IMPRESSION: Some atherosclerosis at the carotid bifurcations bilaterally but no measurable stenosis on either side.  CTA HEAD  Findings:  Both internal carotid arteries are widely patent through the siphon regions.  There is mild atherosclerotic irregularity but no stenosis.  The anterior and middle cerebral vessels are patent without proximal stenosis, aneurysm or vascular malformation.  Both vertebral arteries are patent to the basilar.  No basilar stenosis.  Posterior circulation branch vessels appear intact.  As seen at parenchymal imaging, there are skull base fractures, subarachnoid hemorrhage and indent parenchymal hematoma in the left temporo-parietal junction region.  The hematoma is no larger, again measuring 3 cm in maximal diameter.   Review of the MIP images confirms the above findings.  IMPRESSION: No evidence of intracranial aneurysm or vascular malformation.  Mild atherosclerotic irregularity without significant stenosis or major vessel occlusion.   Original Report Authenticated By: Paulina Fusi, M.D.   Ct Head Wo Contrast  08/15/2012   *RADIOLOGY REPORT*  Clinical Data: Fall.  Followup intracranial hemorrhage.  No new neurological changes.  CT HEAD WITHOUT CONTRAST  Technique:  Contiguous axial images were obtained from the base of the skull through the vertex without contrast.  Comparison: 08/14/2012.  Findings: Left posterior temporal/periatrial hematoma has increased in size now measuring 3 x 2.3 cm versus prior 2.6 x 2 cm.   Mild amount of surrounding vasogenic edema and local mass effect upon the atrium or left lateral ventricle.  Increase in size of medial left occipital lobe hematoma now measuring 1.3 x 0.9 cm versus prior 0.4 x 0.5 cm.  Increase in amount of subarachnoid blood versus shifting of subarachnoid blood now seen more superiorly in the posterior frontal - parietal region.  Prominent cast like subarachnoid blood/subdural blood anterior interhemispheric fissure with subarachnoid blood extending along the course of the right middle cerebral artery similar to the prior exam.  Tiny amount of dependent interventricular blood now noted.  Skull fractures.  Paranasal sinus opacification.  Hematoma subcutaneous region and right periorbital region and contains radiopaque material.  Small amount of gas within the right orbit has shifted position. Orbital wall injury suspected.  IMPRESSION:  Left posterior temporal/periatrial hematoma has increased in size now measuring 3 x 2.3 cm versus prior 2.6 x 2 cm.  Mild amount of surrounding vasogenic edema and local mass effect upon the atrium or left lateral ventricle.  Increase in size of medial left occipital lobe hematoma now measuring 1.3 x 0.9 cm versus prior 0.4 x 0.5 cm.  Increase in amount of subarachnoid blood versus shifting of subarachnoid blood now seen more superiorly in the posterior frontal - parietal region.  Prominent cast like subarachnoid blood/subdural blood anterior interhemispheric fissure with subarachnoid blood extending along the course of the right middle cerebral artery similar to the prior exam.  Tiny amount of dependent interventricular blood now noted.  This has been made a PRA call report utilizing dashboard call feature.   Original Report Authenticated By: Lacy Duverney, M.D.   Ct Head Wo Contrast  08/14/2012   *RADIOLOGY REPORT*  Clinical Data:  Fall with laceration.  The patient has been unresponsive.  CT HEAD WITHOUT CONTRAST CT MAXILLOFACIAL WITHOUT  CONTRAST CT CERVICAL SPINE WITHOUT CONTRAST  Technique:  Multidetector CT imaging of the head, cervical spine, and maxillofacial structures were performed using the standard protocol without intravenous contrast. Multiplanar CT image reconstructions of the cervical spine and maxillofacial structures were also generated.  Comparison:   None  CT HEAD  Findings: Extensive subarachnoid hemorrhage is present within the suprasellar cistern and within the right sylvian fissure.  There is blood along the right side the falx anteriorly and along the left tentorium.  A focal area of parenchymal hemorrhage is evident in the left temporal parietal lobe with subarachnoid extension.  This area likely represents a counter coup injury associated with the front right periorbital hematoma and laceration.  A minimally-displaced fracture is suspected along the physis right skull base adjacent to the foraminal sac is.  Please see the report below for additional facial fractures.  IMPRESSION:  1.  Extensive subarachnoid hemorrhage, predominately within the suprasellar cistern and right sylvian fissure.  This may be post- traumatic as the patient's fractures are on the right.  Aneurysmal hemorrhage is not excluded. 2.  The skull base fracture along the right foramen also.  This crosses the right carotid artery. 3.  Right temporal parietal parenchymal hemorrhage represents a 2.5 cm counter coup injury.  There is adjacent subarachnoid hemorrhage.  CT MAXILLOFACIAL  Findings:  A right orbital floor fracture is displaced with  extensive extraconal gas about the right orbit.  There is no entrapment of the musculature.  A nondisplaced fracture is present in the posterior wall of the right maxillary sinus.  Fluid levels in the sphenoid sinus measures somewhat high density and likely correspond to occult fractures.  There is a fracture along the right side the clivus which potentially crosses the foramen lacerum.  The mastoid air cells are clear.   Mild circumferential mucosal thickening is present in the left maxillary sinus.  The anterior ethmoid air cells and frontal sinuses demonstrate mucosal thickening.  There is a fluid level in the right frontal sinus.  The posterior wall fractures suspected with a tiny locule of pneumocephalus.  A large right periorbital hematoma and laceration is present. Radiopaque densities are evident within the laceration.  The globe is intact.  Extensive subarachnoid hemorrhage is again noted.  IMPRESSION:  1.  Skull base fractures on the right extending through the right side the clivus and foramen lacerum.  Occult fracture is suspected through the right sphenoid sinus. 2.  CTA of the head and neck would be useful for evaluation of carotid artery injury or aneurysm. 3.  Right orbital floor fracture without entrapment of the musculature. 4.  Posterior wall right maxillary sinus fracture. 5.  Nondisplaced fracture through the posterior right frontal sinus with a tiny locule of pneumocephalus. 6.  Large right periorbital hematoma and laceration.  Radiopaque debris is present within the laceration.  CT CERVICAL SPINE  Findings:   Despite imaging twice, the cervical spine images are significantly degraded by patient motion.  Slight degenerative anterolisthesis is present at C3-4.  There is significant loss of disc height at C4-5 and C5-6.  The cervical spine is imaged through T3-4.  No acute fracture or traumatic subluxation is evident. Multilevel facet degenerative changes are worse on the left.  The lung apices demonstrate mild biapical pleural parenchymal scarring.  IMPRESSION:  1.  Moderate spondylosis of the cervical spine without definite fracture. 2.  The images are degraded by patient motion.  Critical Value/emergent results were called by telephone at the time of interpretation on 08/14/2012 at 07:10 p.m. to Dr. Jeraldine Loots, who verbally acknowledged these results.   Original Report Authenticated By: Marin Roberts, M.D.    Ct Angio Neck W/cm &/or Wo/cm  08/14/2012   *RADIOLOGY REPORT*  Clinical Data:  Fall.  Subarachnoid hemorrhage.  Intraparenchymal hemorrhage.  Assess for aneurysmal cause.  CT ANGIOGRAPHY HEAD AND NECK  Technique:  Multidetector CT imaging of the head and neck was performed using the standard protocol during bolus administration of intravenous contrast.  Multiplanar CT image reconstructions including MIPs were obtained to evaluate the vascular anatomy. Carotid stenosis measurements (when applicable) are obtained utilizing NASCET criteria, using the distal internal carotid diameter as the denominator.  Contrast: 50mL OMNIPAQUE IOHEXOL 350 MG/ML SOLN  Comparison:  Head CT same day  CTA NECK  Findings:  There is mild dependent pulmonary atelectasis.  There is scarring at the lung apices.  The branching pattern of the brachiocephalic vessels from the arch is normal.  No origin stenosis.  Both common carotid arteries are widely patent to the bifurcation regions.  On the right, there is calcific plaque at the bifurcation but there is no stenosis of the ICA.  On the left, there is calcific plaque at the bifurcation but again no measurable stenosis of the ICA compared to the more distal cervical ICA diameter.  Both vertebral artery origins are widely patent.  Both vertebral arteries are  widely patent through the cervical region with the right being slightly larger than the left.   Review of the MIP images confirms the above findings.  IMPRESSION: Some atherosclerosis at the carotid bifurcations bilaterally but no measurable stenosis on either side.  CTA HEAD  Findings:  Both internal carotid arteries are widely patent through the siphon regions.  There is mild atherosclerotic irregularity but no stenosis.  The anterior and middle cerebral vessels are patent without proximal stenosis, aneurysm or vascular malformation.  Both vertebral arteries are patent to the basilar.  No basilar stenosis.  Posterior circulation  branch vessels appear intact.  As seen at parenchymal imaging, there are skull base fractures, subarachnoid hemorrhage and indent parenchymal hematoma in the left temporo-parietal junction region.  The hematoma is no larger, again measuring 3 cm in maximal diameter.   Review of the MIP images confirms the above findings.  IMPRESSION: No evidence of intracranial aneurysm or vascular malformation.  Mild atherosclerotic irregularity without significant stenosis or major vessel occlusion.   Original Report Authenticated By: Paulina Fusi, M.D.   Ct Cervical Spine Wo Contrast  08/14/2012   *RADIOLOGY REPORT*  Clinical Data:  Fall with laceration.  The patient has been unresponsive.  CT HEAD WITHOUT CONTRAST CT MAXILLOFACIAL WITHOUT CONTRAST CT CERVICAL SPINE WITHOUT CONTRAST  Technique:  Multidetector CT imaging of the head, cervical spine, and maxillofacial structures were performed using the standard protocol without intravenous contrast. Multiplanar CT image reconstructions of the cervical spine and maxillofacial structures were also generated.  Comparison:   None  CT HEAD  Findings: Extensive subarachnoid hemorrhage is present within the suprasellar cistern and within the right sylvian fissure.  There is blood along the right side the falx anteriorly and along the left tentorium.  A focal area of parenchymal hemorrhage is evident in the left temporal parietal lobe with subarachnoid extension.  This area likely represents a counter coup injury associated with the front right periorbital hematoma and laceration.  A minimally-displaced fracture is suspected along the physis right skull base adjacent to the foraminal sac is.  Please see the report below for additional facial fractures.  IMPRESSION:  1.  Extensive subarachnoid hemorrhage, predominately within the suprasellar cistern and right sylvian fissure.  This may be post- traumatic as the patient's fractures are on the right.  Aneurysmal hemorrhage is not excluded.  2.  The skull base fracture along the right foramen also.  This crosses the right carotid artery. 3.  Right temporal parietal parenchymal hemorrhage represents a 2.5 cm counter coup injury.  There is adjacent subarachnoid hemorrhage.  CT MAXILLOFACIAL  Findings:  A right orbital floor fracture is displaced with extensive extraconal gas about the right orbit.  There is no entrapment of the musculature.  A nondisplaced fracture is present in the posterior wall of the right maxillary sinus.  Fluid levels in the sphenoid sinus measures somewhat high density and likely correspond to occult fractures.  There is a fracture along the right side the clivus which potentially crosses the foramen lacerum.  The mastoid air cells are clear.  Mild circumferential mucosal thickening is present in the left maxillary sinus.  The anterior ethmoid air cells and frontal sinuses demonstrate mucosal thickening.  There is a fluid level in the right frontal sinus.  The posterior wall fractures suspected with a tiny locule of pneumocephalus.  A large right periorbital hematoma and laceration is present. Radiopaque densities are evident within the laceration.  The globe is intact.  Extensive subarachnoid hemorrhage is again  noted.  IMPRESSION:  1.  Skull base fractures on the right extending through the right side the clivus and foramen lacerum.  Occult fracture is suspected through the right sphenoid sinus. 2.  CTA of the head and neck would be useful for evaluation of carotid artery injury or aneurysm. 3.  Right orbital floor fracture without entrapment of the musculature. 4.  Posterior wall right maxillary sinus fracture. 5.  Nondisplaced fracture through the posterior right frontal sinus with a tiny locule of pneumocephalus. 6.  Large right periorbital hematoma and laceration.  Radiopaque debris is present within the laceration.  CT CERVICAL SPINE  Findings:   Despite imaging twice, the cervical spine images are significantly degraded by  patient motion.  Slight degenerative anterolisthesis is present at C3-4.  There is significant loss of disc height at C4-5 and C5-6.  The cervical spine is imaged through T3-4.  No acute fracture or traumatic subluxation is evident. Multilevel facet degenerative changes are worse on the left.  The lung apices demonstrate mild biapical pleural parenchymal scarring.  IMPRESSION:  1.  Moderate spondylosis of the cervical spine without definite fracture. 2.  The images are degraded by patient motion.  Critical Value/emergent results were called by telephone at the time of interpretation on 08/14/2012 at 07:10 p.m. to Dr. Jeraldine Loots, who verbally acknowledged these results.   Original Report Authenticated By: Marin Roberts, M.D.   Dg Pelvis Portable  08/14/2012   *RADIOLOGY REPORT*  Clinical Data: Status post fall  PORTABLE PELVIS  Comparison: None.  Findings: There is no acute fracture or dislocation.  The soft tissues are normal.  IMPRESSION: No acute fracture or dislocation.   Original Report Authenticated By: Sherian Rein, M.D.   Dg Chest Port 1 View  08/16/2012   *RADIOLOGY REPORT*  Clinical Data: Check endotracheal tube.  PORTABLE CHEST - 1 VIEW  Comparison: 08/15/2012  Findings: Endotracheal and enteric tubes appear unchanged in position.  Normal heart size and pulmonary vascularity.  Shallow inspiration.  There is developing infiltration or atelectasis in the left lung base.  Possible small left pleural effusion.  IMPRESSION: Appliances appear stable in location.  Developing infiltration or atelectasis in the left lung base.   Original Report Authenticated By: Burman Nieves, M.D.   Dg Chest Port 1 View  08/15/2012   *RADIOLOGY REPORT*  Clinical Data: Respiratory difficulty  PORTABLE CHEST - 1 VIEW  Comparison: 08/14/2012  Findings: Cardiac shadow is stable.  Endotracheal tube and nasogastric catheter are now seen. The proximal side hole of the nasogastric catheter lies within the distal stomach.   The lungs are clear bilaterally.  IMPRESSION: No acute abnormality noted.   Original Report Authenticated By: Alcide Clever, M.D.   Dg Chest Portable 1 View  08/14/2012   *RADIOLOGY REPORT*  Clinical Data: Status post fall with endotracheal tube placement  PORTABLE CHEST - 1 VIEW  Comparison: None.  Findings: Endotracheal tube is seen in good position.  There is no pneumothorax.  There is no focal infiltrate, pulmonary edema, or pleural effusion.  Mediastinal contour and cardiac silhouette are normal.  There is no pneumothorax.  No acute abnormality is identified the visualized bones.  IMPRESSION: Endotracheal tube in good position.  There is no pneumothorax.  The lungs are clear.   Original Report Authenticated By: Sherian Rein, M.D.   Ct Maxillofacial Wo Cm  08/14/2012   *RADIOLOGY REPORT*  Clinical Data:  Fall with laceration.  The patient has been unresponsive.  CT HEAD WITHOUT CONTRAST CT MAXILLOFACIAL WITHOUT CONTRAST  CT CERVICAL SPINE WITHOUT CONTRAST  Technique:  Multidetector CT imaging of the head, cervical spine, and maxillofacial structures were performed using the standard protocol without intravenous contrast. Multiplanar CT image reconstructions of the cervical spine and maxillofacial structures were also generated.  Comparison:   None  CT HEAD  Findings: Extensive subarachnoid hemorrhage is present within the suprasellar cistern and within the right sylvian fissure.  There is blood along the right side the falx anteriorly and along the left tentorium.  A focal area of parenchymal hemorrhage is evident in the left temporal parietal lobe with subarachnoid extension.  This area likely represents a counter coup injury associated with the front right periorbital hematoma and laceration.  A minimally-displaced fracture is suspected along the physis right skull base adjacent to the foraminal sac is.  Please see the report below for additional facial fractures.  IMPRESSION:  1.  Extensive subarachnoid  hemorrhage, predominately within the suprasellar cistern and right sylvian fissure.  This may be post- traumatic as the patient's fractures are on the right.  Aneurysmal hemorrhage is not excluded. 2.  The skull base fracture along the right foramen also.  This crosses the right carotid artery. 3.  Right temporal parietal parenchymal hemorrhage represents a 2.5 cm counter coup injury.  There is adjacent subarachnoid hemorrhage.  CT MAXILLOFACIAL  Findings:  A right orbital floor fracture is displaced with extensive extraconal gas about the right orbit.  There is no entrapment of the musculature.  A nondisplaced fracture is present in the posterior wall of the right maxillary sinus.  Fluid levels in the sphenoid sinus measures somewhat high density and likely correspond to occult fractures.  There is a fracture along the right side the clivus which potentially crosses the foramen lacerum.  The mastoid air cells are clear.  Mild circumferential mucosal thickening is present in the left maxillary sinus.  The anterior ethmoid air cells and frontal sinuses demonstrate mucosal thickening.  There is a fluid level in the right frontal sinus.  The posterior wall fractures suspected with a tiny locule of pneumocephalus.  A large right periorbital hematoma and laceration is present. Radiopaque densities are evident within the laceration.  The globe is intact.  Extensive subarachnoid hemorrhage is again noted.  IMPRESSION:  1.  Skull base fractures on the right extending through the right side the clivus and foramen lacerum.  Occult fracture is suspected through the right sphenoid sinus. 2.  CTA of the head and neck would be useful for evaluation of carotid artery injury or aneurysm. 3.  Right orbital floor fracture without entrapment of the musculature. 4.  Posterior wall right maxillary sinus fracture. 5.  Nondisplaced fracture through the posterior right frontal sinus with a tiny locule of pneumocephalus. 6.  Large right  periorbital hematoma and laceration.  Radiopaque debris is present within the laceration.  CT CERVICAL SPINE  Findings:   Despite imaging twice, the cervical spine images are significantly degraded by patient motion.  Slight degenerative anterolisthesis is present at C3-4.  There is significant loss of disc height at C4-5 and C5-6.  The cervical spine is imaged through T3-4.  No acute fracture or traumatic subluxation is evident. Multilevel facet degenerative changes are worse on the left.  The lung apices demonstrate mild biapical pleural parenchymal scarring.  IMPRESSION:  1.  Moderate spondylosis of the cervical spine without definite fracture. 2.  The images are degraded by patient motion.  Critical Value/emergent results were called by telephone at the time of interpretation on  08/14/2012 at 07:10 p.m. to Dr. Jeraldine Loots, who verbally acknowledged these results.   Original Report Authenticated By: Marin Roberts, M.D.    Assessment/Plan: Neurologically stable will not follow commands however has wildly purposeful movements. Continue weaning the sedation with the idea towards extubation per trauma repeat head CT in the morning.  LOS: 2 days     Stephanieann Popescu P 08/16/2012, 7:08 AM

## 2012-08-16 NOTE — Progress Notes (Signed)
Follow up - Trauma and Critical Care  Patient Details:    Theresa Lynch is an 79 y.o. female.  Lines/tubes : Airway 7.5 mm (Active)  Secured at (cm) 23 cm 08/16/2012  3:13 AM  Measured From Lips 08/16/2012  3:13 AM  Secured Location Left 08/16/2012  3:13 AM  Secured By [employer] 08/16/2012  3:13 AM  Tube Holder Repositioned Yes 08/16/2012  3:13 AM  Cuff Pressure (cm H2O) 26 cm H2O 08/16/2012  3:13 AM  Site Condition Dry 08/16/2012  3:13 AM     NG/OG Tube Orogastric 16 Fr. Center mouth (Active)  Placement Verification Auscultation 08/15/2012  8:00 PM  Site Assessment Clean;Dry;Intact 08/15/2012  8:00 PM  Status Suction-low intermittent 08/15/2012  8:00 PM  Drainage Appearance None 08/15/2012  8:00 AM  Output (mL) 150 mL 08/15/2012  6:00 PM     Urethral Catheter Temperature probe 14 Fr. (Active)  Indication for Insertion or Continuance of Catheter Unstable critical patients (first 24-48 hours) 08/15/2012  8:00 PM  Site Assessment Clean;Intact 08/15/2012  8:00 PM  Collection Container Standard drainage bag 08/15/2012  8:00 PM  Securement Method Leg strap 08/15/2012  8:00 PM  Urinary Catheter Interventions Unclamped 08/15/2012  8:00 PM  Output (mL) 30 mL 08/15/2012  6:00 AM    Microbiology/Sepsis markers: Results for orders placed during the hospital encounter of 08/14/12  MRSA PCR SCREENING     Status: None   Collection Time    08/15/12  4:31 AM      Result Value Range Status   MRSA by PCR NEGATIVE  NEGATIVE Final   Comment:            The GeneXpert MRSA Assay (FDA     approved for NASAL specimens     only), is one component of a     comprehensive MRSA colonization     surveillance program. It is not     intended to diagnose MRSA     infection nor to guide or     monitor treatment for     MRSA infections.    Anti-infectives:  Anti-infectives   Start     Dose/Rate Route Frequency Ordered Stop   08/15/12 0600  ceFAZolin (ANCEF) IVPB 1 g/50 mL premix     1 g 100 mL/hr  over 30 Minutes Intravenous 3 times per day 08/14/12 2054     08/14/12 2015  ceFAZolin (ANCEF) IVPB 1 g/50 mL premix     1 g 100 mL/hr over 30 Minutes Intravenous  Once 08/14/12 2008 08/14/12 2122   08/14/12 2000  ceFAZolin (ANCEF) injection 1 g  Status:  Discontinued     1 g Intramuscular  Once 08/14/12 1955 08/14/12 2007      Best Practice/Protocols:  VTE Prophylaxis: Mechanical GI Prophylaxis: Proton Pump Inhibitor Continous Sedation  Consults:      Events:  Subjective:    Overnight Issues: Patient does not follow commands.  Agitated on the ventilator.  Not able to wean and extubate yesterday.  Objective:  Vital signs for last 24 hours: Temp:  [98.1 F (36.7 C)-99.5 F (37.5 C)] 99.1 F (37.3 C) (07/25 0700) Pulse Rate:  [64-91] 66 (07/25 0700) Resp:  [12-21] 15 (07/25 0700) BP: (91-151)/(40-58) 107/44 mmHg (07/25 0700) SpO2:  [99 %-100 %] 100 % (07/25 0700) FiO2 (%):  [40 %] 40 % (07/25 0700)  Hemodynamic parameters for last 24 hours:    Intake/Output from previous day: 07/24 0701 - 07/25 0700 In: 2022.1 [I.V.:1922.1; IV  Piggyback:100] Out: 655 [Urine:505; Emesis/NG output:150]  Intake/Output this shift:    Vent settings for last 24 hours: Vent Mode:  [-] PRVC FiO2 (%):  [40 %] 40 % Set Rate:  [12 bmp] 12 bmp Vt Set:  [480 mL] 480 mL PEEP:  [5 cmH20] 5 cmH20 Pressure Support:  [10 cmH20] 10 cmH20 Plateau Pressure:  [12 cmH20-23 cmH20] 15 cmH20  Physical Exam:  General: no respiratory distress and facial swelling has improved. Neuro: nonfocal exam and RASS -1 Resp: clear to auscultation bilaterally CVS: regular rate and rhythm, S1, S2 normal, no murmur, click, rub or gallop GI: soft, nontender, BS WNL, no r/g and Not getting tube feedings yet. Extremities: no edema, no erythema, pulses WNL and SCDs in place.  Results for orders placed during the hospital encounter of 08/14/12 (from the past 24 hour(s))  CBC WITH DIFFERENTIAL     Status: Abnormal    Collection Time    08/15/12 12:05 PM      Result Value Range   WBC 12.4 (*) 4.0 - 10.5 K/uL   RBC 2.87 (*) 3.87 - 5.11 MIL/uL   Hemoglobin 9.0 (*) 12.0 - 15.0 g/dL   HCT 16.1 (*) 09.6 - 04.5 %   MCV 93.4  78.0 - 100.0 fL   MCH 31.4  26.0 - 34.0 pg   MCHC 33.6  30.0 - 36.0 g/dL   RDW 40.9  81.1 - 91.4 %   Platelets 200  150 - 400 K/uL   Neutrophils Relative % 79 (*) 43 - 77 %   Neutro Abs 9.8 (*) 1.7 - 7.7 K/uL   Lymphocytes Relative 10 (*) 12 - 46 %   Lymphs Abs 1.2  0.7 - 4.0 K/uL   Monocytes Relative 9  3 - 12 %   Monocytes Absolute 1.2 (*) 0.1 - 1.0 K/uL   Eosinophils Relative 1  0 - 5 %   Eosinophils Absolute 0.2  0.0 - 0.7 K/uL   Basophils Relative 0  0 - 1 %   Basophils Absolute 0.0  0.0 - 0.1 K/uL  CBC     Status: Abnormal   Collection Time    08/16/12  3:35 AM      Result Value Range   WBC 9.3  4.0 - 10.5 K/uL   RBC 2.65 (*) 3.87 - 5.11 MIL/uL   Hemoglobin 8.3 (*) 12.0 - 15.0 g/dL   HCT 78.2 (*) 95.6 - 21.3 %   MCV 94.3  78.0 - 100.0 fL   MCH 31.3  26.0 - 34.0 pg   MCHC 33.2  30.0 - 36.0 g/dL   RDW 08.6  57.8 - 46.9 %   Platelets 168  150 - 400 K/uL  BASIC METABOLIC PANEL     Status: Abnormal   Collection Time    08/16/12  3:35 AM      Result Value Range   Sodium 138  135 - 145 mEq/L   Potassium 3.1 (*) 3.5 - 5.1 mEq/L   Chloride 105  96 - 112 mEq/L   CO2 26  19 - 32 mEq/L   Glucose, Bld 107 (*) 70 - 99 mg/dL   BUN 16  6 - 23 mg/dL   Creatinine, Ser 6.29  0.50 - 1.10 mg/dL   Calcium 8.6  8.4 - 52.8 mg/dL   GFR calc non Af Amer 76 (*) >90 mL/min   GFR calc Af Amer 88 (*) >90 mL/min     Assessment/Plan:   NEURO  Altered Mental Status:  agitation, delirium, sedation and goes between oversedation and agitation, hard to wean   Plan: Attempt to wean again today, change some of her sedation orders.  PULM  No specific issues   Plan: CPM  CARDIO  No specific issues   Plan: CPM  RENAL  Oliguria (probably hypovolemia)   Plan: Bolus as needed.  GI  No  specific issues   Plan: Consider tube feedings if not extubated soon.  ID  No known infectious problems currently   Plan: CPM  HEME  Anemia acute blood loss anemia)   Plan: No blood needed currently  ENDO Hypokalemia, moderate   Plan: Replace KCl  Global Issues  Patient is difficult to wean from the sedation and the ventilator because of her head injury.  Will try to switch to Precedex and stop propofol since she had a moment of apnea with propofol yesterday.  Try to extubate today.    LOS: 2 days   Additional comments:I reviewed the patient's new clinical lab test results. cbc/bmet and I reviewed the patients new imaging test results. cxr  Critical Care Total Time*: 30 Minutes  Gaynell Eggleton O 08/16/2012  *Care during the described time interval was provided by me and/or other providers on the critical care team.  I have reviewed this patient's available data, including medical history, events of note, physical examination and test results as part of my evaluation.

## 2012-08-16 NOTE — Progress Notes (Signed)
At 11:30, pt became very agitated, exiting bed. Took 3 RNs to get her back in. Precedex gtt restarted, will need to continue restraints for her safety.Discussed with family.

## 2012-08-16 NOTE — Progress Notes (Signed)
Maxillofacial Consult Note  Requested by Trauma Team to eval this 79 yo female involved in a fall 08-14-12.  She reportedly struck the right side of her face/head and suffered a right orbital floor fracture and right maxillary wall fracture along with a basilar skull fracture and assoc subarachnoid hemorrhage.  She is currently intubated with significant periorbital swelling and ecchymosis.  A right facial laceration was repaired by Dr. Lindie Spruce.  A maxillofacial CT scan was reviewed and reveals a minimally displaced right orbital floor fracture with no EOM entrapment or herniation of fat into the right max sinus.  There is signif air within the orbit c/w the fracture.  There is a minimally displaced right post max sinus wall fracture as well and the right max sinus is opacified (blood-filled) again c/w the trauma.  A/P:  With minimally displaced orbital floor fracture and max wall fracture no surgical intervention is necessary.

## 2012-08-17 ENCOUNTER — Inpatient Hospital Stay (HOSPITAL_COMMUNITY): Payer: Medicare Other

## 2012-08-17 DIAGNOSIS — IMO0002 Reserved for concepts with insufficient information to code with codable children: Secondary | ICD-10-CM

## 2012-08-17 LAB — CBC WITH DIFFERENTIAL/PLATELET
Eosinophils Relative: 0 % (ref 0–5)
HCT: 27.2 % — ABNORMAL LOW (ref 36.0–46.0)
Hemoglobin: 9.2 g/dL — ABNORMAL LOW (ref 12.0–15.0)
Lymphocytes Relative: 5 % — ABNORMAL LOW (ref 12–46)
Lymphs Abs: 0.7 10*3/uL (ref 0.7–4.0)
MCV: 93.8 fL (ref 78.0–100.0)
Monocytes Absolute: 0.6 10*3/uL (ref 0.1–1.0)
Monocytes Relative: 5 % (ref 3–12)
Neutro Abs: 11.9 10*3/uL — ABNORMAL HIGH (ref 1.7–7.7)
WBC: 13.2 10*3/uL — ABNORMAL HIGH (ref 4.0–10.5)

## 2012-08-17 LAB — BASIC METABOLIC PANEL
BUN: 18 mg/dL (ref 6–23)
CO2: 22 mEq/L (ref 19–32)
Chloride: 107 mEq/L (ref 96–112)
Creatinine, Ser: 0.69 mg/dL (ref 0.50–1.10)
Glucose, Bld: 127 mg/dL — ABNORMAL HIGH (ref 70–99)

## 2012-08-17 LAB — TRIGLYCERIDES: Triglycerides: 138 mg/dL (ref <150)

## 2012-08-17 MED ORDER — BACITRACIN ZINC 500 UNIT/GM EX OINT
TOPICAL_OINTMENT | Freq: Two times a day (BID) | CUTANEOUS | Status: DC
  Administered 2012-08-17 – 2012-08-18 (×3): 31.5556 via TOPICAL
  Administered 2012-08-18 – 2012-08-19 (×2): via TOPICAL
  Administered 2012-08-19 – 2012-08-20 (×2): 31.5556 via TOPICAL
  Filled 2012-08-17 (×2): qty 28.35

## 2012-08-17 NOTE — Progress Notes (Signed)
I have seen and examined the patient and agree with the assessment and plans. Follow cbc and xray  Abdifatah Colquhoun A. Magnus Ivan  MD, FACS

## 2012-08-17 NOTE — Progress Notes (Signed)
Patient ID: Theresa Lynch, female   DOB: 1930/01/05, 79 y.o.   MRN: [id number]   LOS: 3 days   Subjective: No c/o. Disoriented, possibly partially aphasic.   Objective: Vital signs in last 24 hours: Temp:  [97.4 F (36.3 C)-99.1 F (37.3 C)] 98.1 F (36.7 C) (07/26 0414) Pulse Rate:  [64-102] 84 (07/26 0600) Resp:  [12-24] 24 (07/26 0500) BP: (100-184)/(44-152) 179/88 mmHg (07/26 0600) SpO2:  [94 %-100 %] 100 % (07/26 0600) FiO2 (%):  [40 %] 40 % (07/25 0925)    Laboratory  CBC  Recent Labs  08/16/12 0335 08/17/12 0530  WBC 9.3 13.2*  HGB 8.3* 9.2*  HCT 25.0* 27.2*  PLT 168 226   BMET  Recent Labs  08/16/12 0335 08/17/12 0530  NA 138 141  K 3.1* 4.1  CL 105 107  CO2 26 22  GLUCOSE 107* 127*  BUN 16 18  CREATININE 0.78 0.69  CALCIUM 8.6 9.2    Radiology Results HCT: Pending (Could not obtain secondary to agitation this morning)   Physical Exam General appearance: alert and no distress Resp: clear to auscultation bilaterally and congested cough Cardio: regular rate and rhythm GI: normal findings: bowel sounds normal and soft, non-tender Pulses: 2+ and symmetric Neuro: E4V4M6=14, PERRL, Ox1 (got first and middle, couldn't get last name)   Assessment/Plan: Fall and found down  TBI/SAH/R temporal ICC/basilar skull FX - TBI team R orbit FX, R max sinus FX, frontal sinus FX - Non-operative Facial lacs -- Local care ABL anemia - Stable, improved ID -- Mild leukocytosis but afebrile, watch for PNA with cough, obtain CXR FEN - Swallow eval. Will d/c prophylactic abx, obtain flex/ex to clear c-spine VTE -- SCD's Dispo - Continue ICU with pending HCT but can possibly go to SDU if HCT stable and NS agrees.    Freeman Caldron, PA-C Pager: [phone] General Trauma PA Pager: [phone]   08/17/2012

## 2012-08-17 NOTE — Progress Notes (Signed)
Subjective: Patient reports Patient is extubated alert follows some commands but is rambunctious. Does not like cervical collar  Objective: Vital signs in last 24 hours: Temp:  [97.4 F (36.3 C)-99 F (37.2 C)] 98.4 F (36.9 C) (07/26 0800) Pulse Rate:  [65-102] 82 (07/26 0800) Resp:  [12-24] 19 (07/26 0800) BP: (100-179)/(44-108) 167/80 mmHg (07/26 0800) SpO2:  [94 %-100 %] 99 % (07/26 0800)  Intake/Output from previous day: 07/25 0701 - 07/26 0700 In: 2500 [I.V.:2450; IV Piggyback:50] Out: 150 [Urine:150] Intake/Output this shift: Total I/O In: 100 [I.V.:100] Out: 100 [Urine:100]  Moves all 4 extremities well. Oriented x1  Lab Results:  Recent Labs  08/16/12 0335 08/17/12 0530  WBC 9.3 13.2*  HGB 8.3* 9.2*  HCT 25.0* 27.2*  PLT 168 226   BMET  Recent Labs  08/16/12 0335 08/17/12 0530  NA 138 141  K 3.1* 4.1  CL 105 107  CO2 26 22  GLUCOSE 107* 127*  BUN 16 18  CREATININE 0.78 0.69  CALCIUM 8.6 9.2    Studies/Results: Ct Head Wo Contrast  08/15/2012   *RADIOLOGY REPORT*  Clinical Data: Fall.  Followup intracranial hemorrhage.  No new neurological changes.  CT HEAD WITHOUT CONTRAST  Technique:  Contiguous axial images were obtained from the base of the skull through the vertex without contrast.  Comparison: 08/14/2012.  Findings: Left posterior temporal/periatrial hematoma has increased in size now measuring 3 x 2.3 cm versus prior 2.6 x 2 cm.  Mild amount of surrounding vasogenic edema and local mass effect upon the atrium or left lateral ventricle.  Increase in size of medial left occipital lobe hematoma now measuring 1.3 x 0.9 cm versus prior 0.4 x 0.5 cm.  Increase in amount of subarachnoid blood versus shifting of subarachnoid blood now seen more superiorly in the posterior frontal - parietal region.  Prominent cast like subarachnoid blood/subdural blood anterior interhemispheric fissure with subarachnoid blood extending along the course of the right middle  cerebral artery similar to the prior exam.  Tiny amount of dependent interventricular blood now noted.  Skull fractures.  Paranasal sinus opacification.  Hematoma subcutaneous region and right periorbital region and contains radiopaque material.  Small amount of gas within the right orbit has shifted position. Orbital wall injury suspected.  IMPRESSION:  Left posterior temporal/periatrial hematoma has increased in size now measuring 3 x 2.3 cm versus prior 2.6 x 2 cm.  Mild amount of surrounding vasogenic edema and local mass effect upon the atrium or left lateral ventricle.  Increase in size of medial left occipital lobe hematoma now measuring 1.3 x 0.9 cm versus prior 0.4 x 0.5 cm.  Increase in amount of subarachnoid blood versus shifting of subarachnoid blood now seen more superiorly in the posterior frontal - parietal region.  Prominent cast like subarachnoid blood/subdural blood anterior interhemispheric fissure with subarachnoid blood extending along the course of the right middle cerebral artery similar to the prior exam.  Tiny amount of dependent interventricular blood now noted.  This has been made a PRA call report utilizing dashboard call feature.   Original Report Authenticated By: Lacy Duverney, M.D.   Dg Chest Port 1 View  08/16/2012   *RADIOLOGY REPORT*  Clinical Data: Check endotracheal tube.  PORTABLE CHEST - 1 VIEW  Comparison: 08/15/2012  Findings: Endotracheal and enteric tubes appear unchanged in position.  Normal heart size and pulmonary vascularity.  Shallow inspiration.  There is developing infiltration or atelectasis in the left lung base.  Possible small left pleural effusion.  IMPRESSION: Appliances appear stable in location.  Developing infiltration or atelectasis in the left lung base.   Original Report Authenticated By: Burman Nieves, M.D.    Assessment/Plan: Repeat CT to be done a bit later today after we obtain flexion-extension films of neck. His neck is stable she can come  out of the collar and perhaps tolerate the CT scan better.  LOS: 3 days  Clinically improving now that she is extubated.   Case Vassell J 08/17/2012, 10:07 AM

## 2012-08-17 NOTE — Progress Notes (Signed)
SLP Note  Patient Details Name: RUCHAMA KUBICEK MRN: [id number] DOB: [birth year]     BSE/SLE completed. Recommend puree diet with thin liquids, small pills in puree, large pills crushed if able. Pt with receptive and expressive language deficits.  ST to follow for diet tolerance and language tx.  Full report to follow.  Celia B. Murvin Natal [facility], CCC-SLP [phone]   Leigh Aurora 08/17/2012, 10:15 AM

## 2012-08-17 NOTE — Progress Notes (Signed)
Weekend Clinical Social Worker (CSW) unable to complete SBIRT or assess for SNF placement as pt remains disoriented. Weekday CSW to reasses.  Theresia Bough, MSW, Theresia Majors [phone]

## 2012-08-17 NOTE — Evaluation (Signed)
Clinical/Bedside Swallow Evaluation Patient Details  Name: Theresa Lynch MRN: [id number] Date of Birth: May 14, 1929  Today's Date: 08/17/2012 Time: 0930-1000 SLP Time Calculation (min): 30 min  Past Medical History:  Past Medical History  Diagnosis Date  . Glaucoma   . Depression   . Hyperlipidemia   . Arthritis   . PONV (postoperative nausea and vomiting)    Past Surgical History:  Past Surgical History  Procedure Laterality Date  . Rotator cuff repair  19929   HPI:  79 year old female admitted 08/16/12 following a fall/ facial lac. CT revealed extensive SAH in the suprasellar cistern and within the right sylvian fissure, as well as Left temporoparietal hemotoma.   Assessment / Plan / Recommendation Clinical Impression  Pt oral motor strength and function appear within normal limits. No overt s/s aspiration observed during po trials.  Pt does tend to be impulsive with liquids, so caution should be used with liquids.  Solids not tested due to impulsivity and decreased ability to follow directions consistently.  Will begin puree diet with thin liquids, crush meds in puree.  ST to follow for diet tolerance and appropriateness to advance diet.    Aspiration Risk  Mild    Diet Recommendation Dysphagia 1 (Puree);Thin liquid   Liquid Administration via: Straw Medication Administration: Crushed with puree Supervision: Staff feed patient Compensations: Slow rate;Small sips/bites Postural Changes and/or Swallow Maneuvers: Upright 30-60 min after meal;Seated upright 90 degrees    Other  Recommendations Oral Care Recommendations: Oral care before and after PO;Staff/trained caregiver to provide oral care   Follow Up Recommendations  Inpatient Rehab    Frequency and Duration min 2x/week  2 weeks   Pertinent Vitals/Pain No pain reported.    SLP Swallow Goals Patient will consume recommended diet without observed clinical signs of aspiration with: Supervision/safety;Moderate  assistance Swallow Study Goal #1 - Progress: Progressing toward goal Patient will utilize recommended strategies during swallow to increase swallowing safety with: Supervision/safety;Moderate assistance Swallow Study Goal #2 - Progress: Progressing toward goal   Swallow Study Prior Functional Status  Cognitive/Linguistic Baseline: Within functional limits Type of Home: House  Lives With: Spouse Available Help at Discharge: Family;Friend(s) Education: college graduate - [employer] Vocation: Retired    General Date of Onset: 08/16/12 HPI: 79 year old female admitted 08/16/12 following a fall/ facial lac. CT revealed extensive SAH in the suprasellar cistern and within the right sylvian fissure, as well as Left temporoparietal hemotoma. Type of Study: Bedside swallow evaluation Diet Prior to this Study: NPO Temperature Spikes Noted: No Respiratory Status: Room air History of Recent Intubation: Yes Length of Intubations (days): 3 days Date extubated: 08/16/12 Behavior/Cognition: Alert;Cooperative;Pleasant mood;Doesn't follow directions;Hard of hearing;Distractible;Impulsive;Confused Oral Cavity - Dentition: Adequate natural dentition Self-Feeding Abilities: Total assist Patient Positioning: Upright in bed Baseline Vocal Quality: Clear Volitional Cough: Weak Volitional Swallow: Able to elicit    Oral/Motor/Sensory Function Overall Oral Motor/Sensory Function: Appears within functional limits for tasks assessed   Ice Chips Ice chips: Within functional limits Presentation: Spoon   Thin Liquid Thin Liquid: Within functional limits Presentation: Straw    Nectar Thick Nectar Thick Liquid: Not tested   Honey Thick Honey Thick Liquid: Not tested   Puree Puree: Within functional limits Presentation: Spoon   Solid   GO    Solid: Not tested      Deangela Randleman B. Murvin Natal [facility], CCC-SLP [phone]  Leigh Aurora 08/17/2012,1:27 PM

## 2012-08-17 NOTE — Evaluation (Signed)
Speech Language Pathology Evaluation Patient Details Name: Theresa Lynch MRN: [id number] DOB: 1930-01-10 Today's Date: 08/17/2012 Time: 0900-1000 SLP Time Calculation (min): 60 min  Problem List:  Patient Active Problem List   Diagnosis Date Noted  . Intracerebral hemorrhage 08/15/2012  . Subarachnoid hemorrhage 08/15/2012  . Closed basilar skull fracture with subarachnoid hemorrhage 08/15/2012  . Closed fracture of facial bones 08/15/2012   Past Medical History:  Past Medical History  Diagnosis Date  . Glaucoma   . Depression   . Hyperlipidemia   . Arthritis   . PONV (postoperative nausea and vomiting)    Past Surgical History:  Past Surgical History  Procedure Laterality Date  . Rotator cuff repair  19971   HPI:  79 year old female admitted 08/16/12 following a fall/ facial lac. CT revealed extensive SAH in the suprasellar cistern and within the right sylvian fissure, as well as Left temporoparietal hemotoma.   Assessment / Plan / Recommendation Clinical Impression  Pt exhibits intelligible speech (no dysarthria or apparent apraxia).  Pt demonstrates receptive and expressive language deficits,   She follows simple commands most of the time, and benefits from visual cues.  Yes/no responses are not consistently accurate, and are therefore unreliable.  Current level of verbalization is limited to single words. She is unable to name objects consistently. Pt is able to repeat multisyllabic words given repetition and additional time.  Unable to assess cognition at this point, given impairments in receptive and expressive language.  ST to gollow.    SLP Assessment  Patient needs continued Speech Lanaguage Pathology Services    Follow Up Recommendations  Inpatient Rehab    Frequency and Duration min 2x/week  2 weeks   Pertinent Vitals/Pain No pain indicated   SLP Goals  SLP Goals Potential to Achieve Goals: Good Potential Considerations: Ability to learn/carryover  information;Cooperation/participation level;Previous level of function;Family/community support Progress/Goals/Alternative treatment plan discussed with pt/caregiver and they: Agree SLP Goal #1: Pt will answer simple yes/no questions with 100% accuracy, given mod cues. SLP Goal #1 - Progress: Progressing toward goal SLP Goal #2: Pt will follow 2 step verbal directions with 80% accuracy given mod cues SLP Goal #2 - Progress: Progressing toward goal SLP Goal #3: Pt will complete automatic sequences with 80% accuracy, given mod cues SLP Goal #3 - Progress: Progressing toward goal SLP Goal #4: Pt will complete simple naming tasks with 80% accuracy, given mod cues. SLP Goal #4 - Progress: Progressing toward goal  SLP Evaluation Prior Functioning  Cognitive/Linguistic Baseline: Within functional limits Type of Home: House  Lives With: Spouse Available Help at Discharge: Family;Friend(s) Education: college graduate - [employer] Vocation: Retired   IT consultant  Overall Cognitive Status: Difficult to assess (due to language impairment.) Orientation Level: Oriented to person    Comprehension  Auditory Comprehension Overall Auditory Comprehension: Impaired Yes/No Questions: Impaired Basic Biographical Questions: 76-100% accurate Basic Immediate Environment Questions: 25-49% accurate Commands: Impaired One Step Basic Commands: 50-74% accurate Two Step Basic Commands: 25-49% accurate Interfering Components: Anxiety EffectiveTechniques: Repetition;Visual/Gestural cues;Extra processing time Visual Recognition/Discrimination Discrimination: Not tested Reading Comprehension Reading Status: Not tested    Expression Expression Primary Mode of Expression: Verbal Verbal Expression Overall Verbal Expression: Impaired Initiation: Impaired Automatic Speech: Counting Level of Generative/Spontaneous Verbalization: Word;Phrase Repetition: Impaired Level of Impairment: Word level Naming:  Impairment Responsive: 0-25% accurate Confrontation: Impaired Convergent: 0-24% accurate Divergent: 0-24% accurate Verbal Errors: Not aware of errors Pragmatics: Unable to assess Non-Verbal Means of Communication: Not applicable Written Expression Dominant Hand: Right Written  Expression: Not tested   Oral / Motor Oral Motor/Sensory Function Overall Oral Motor/Sensory Function: Appears within functional limits for tasks assessed Motor Speech Overall Motor Speech: Appears within functional limits for tasks assessed   GO    Marshun Duva B. Murvin Natal [facility], CCC-SLP [phone]  Leigh Aurora 08/17/2012, 12:20 PM

## 2012-08-18 DIAGNOSIS — D62 Acute posthemorrhagic anemia: Secondary | ICD-10-CM | POA: Diagnosis not present

## 2012-08-18 DIAGNOSIS — J9600 Acute respiratory failure, unspecified whether with hypoxia or hypercapnia: Secondary | ICD-10-CM | POA: Diagnosis present

## 2012-08-18 DIAGNOSIS — W19XXXA Unspecified fall, initial encounter: Secondary | ICD-10-CM

## 2012-08-18 LAB — CBC
Hemoglobin: 10.3 g/dL — ABNORMAL LOW (ref 12.0–15.0)
Platelets: 333 10*3/uL (ref 150–400)
RBC: 3.23 MIL/uL — ABNORMAL LOW (ref 3.87–5.11)
WBC: 15.4 10*3/uL — ABNORMAL HIGH (ref 4.0–10.5)

## 2012-08-18 LAB — URINE MICROSCOPIC-ADD ON

## 2012-08-18 LAB — URINALYSIS, ROUTINE W REFLEX MICROSCOPIC
Leukocytes, UA: NEGATIVE
Nitrite: NEGATIVE
Specific Gravity, Urine: 1.012 (ref 1.005–1.030)
pH: 6 (ref 5.0–8.0)

## 2012-08-18 MED ORDER — CYANOCOBALAMIN 250 MCG PO TABS
500.0000 ug | ORAL_TABLET | Freq: Every day | ORAL | Status: DC
  Administered 2012-08-18 – 2012-08-20 (×3): 500 ug via ORAL
  Filled 2012-08-18 (×3): qty 2

## 2012-08-18 MED ORDER — HYDROCHLOROTHIAZIDE 25 MG PO TABS
25.0000 mg | ORAL_TABLET | Freq: Every day | ORAL | Status: DC
  Administered 2012-08-18 – 2012-08-20 (×3): 25 mg via ORAL
  Filled 2012-08-18 (×3): qty 1

## 2012-08-18 MED ORDER — PAROXETINE HCL 20 MG PO TABS
40.0000 mg | ORAL_TABLET | Freq: Every day | ORAL | Status: DC
  Administered 2012-08-18 – 2012-08-19 (×2): 40 mg via ORAL
  Filled 2012-08-18 (×3): qty 2

## 2012-08-18 MED ORDER — ATORVASTATIN CALCIUM 10 MG PO TABS
10.0000 mg | ORAL_TABLET | Freq: Every day | ORAL | Status: DC
  Administered 2012-08-18 – 2012-08-20 (×3): 10 mg via ORAL
  Filled 2012-08-18 (×3): qty 1

## 2012-08-18 MED ORDER — ENSURE PUDDING PO PUDG
1.0000 | Freq: Three times a day (TID) | ORAL | Status: DC
  Administered 2012-08-18 – 2012-08-20 (×5): 1 via ORAL

## 2012-08-18 MED ORDER — TRAZODONE HCL 50 MG PO TABS
50.0000 mg | ORAL_TABLET | Freq: Every day | ORAL | Status: DC
  Administered 2012-08-18 – 2012-08-19 (×2): 50 mg via ORAL
  Filled 2012-08-18 (×3): qty 1

## 2012-08-18 MED ORDER — PAROXETINE HCL 20 MG PO TABS
40.0000 mg | ORAL_TABLET | Freq: Once | ORAL | Status: AC
  Administered 2012-08-18: 40 mg via ORAL
  Filled 2012-08-18: qty 2

## 2012-08-18 MED ORDER — TIMOLOL MALEATE 0.5 % OP SOLN
1.0000 [drp] | Freq: Every day | OPHTHALMIC | Status: DC
  Administered 2012-08-18 – 2012-08-19 (×2): 1 [drp] via OPHTHALMIC
  Filled 2012-08-18: qty 5

## 2012-08-18 NOTE — Progress Notes (Signed)
Doing better Step-down  Wilmon Arms. Corliss Skains, MD, [facility]  General/ Trauma Surgery  08/18/2012 12:48 PM

## 2012-08-18 NOTE — Evaluation (Signed)
Physical Therapy Evaluation Patient Details Name: Theresa Lynch MRN: [id number] DOB: 20-Aug-1929 Today's Date: 08/18/2012 Time: 1050-1120 PT Time Calculation (min): 30 min  PT Assessment / Plan / Recommendation History of Present Illness  Pt adm s/p unwitnessed fall (went to get her mail; found by passerby in the ditch beside road; apparently hit her head on concrete pipe in ditch--all per daughter). Pt with Lt temporal-parietal hematoma, medial Lt occipital hematoma, basilar skull fx, Rt orbital and maxillary fxs. Pt was intubated with extubation 7/26. GCS 9-10 on admission.  Clinical Impression  Pt s/p head trauma due to a fall with above injuries as noted. Pt currently a [facility] level V (confused, inappropriate, non-agitated). Pt following basic commands >50% of time (with verbal and visual cues). Per family, she was in good health PTA and did not have balance issues PTA. Feel pt will make an excellent inpt rehab candidate. Family in agreement to pursue post-acute therapy prior to home.    PT Assessment  Patient needs continued PT services    Follow Up Recommendations  CIR;Supervision/Assistance - 24 hour    Does the patient have the potential to tolerate intense rehabilitation      Barriers to Discharge        Equipment Recommendations  Other (comment) (TBA)    Recommendations for Other Services Rehab consult;OT consult   Frequency Min 4X/week    Precautions / Restrictions Precautions Precautions: Fall Precaution Comments: daughter reports no h/o imbalance PTA--unclear why she fell   Pertinent Vitals/Pain Denied pain throughout session      Mobility  Bed Mobility Bed Mobility: Not assessed Transfers Transfers: Sit to Stand;Stand to Sit Sit to Stand: 4: Min assist;With upper extremity assist Stand to Sit: 4: Min guard;With upper extremity assist Details for Transfer Assistance: x 2; pt scooted to front edge of seat without cues; slow rise to stand--no LOB;  uses bil UE support without cues with controllled descent Ambulation/Gait Ambulation/Gait Assistance: 4: Min assist Ambulation Distance (Feet): 100 Feet Assistive device: 1 person hand held assist Ambulation/Gait Assistance Details: initially very slow gait; able to incr speed as instructed; slightly unsteady Gait Pattern: Step-through pattern;Decreased stride length Gait velocity: decr    Exercises     PT Diagnosis: Difficulty walking  PT Problem List: Decreased balance;Decreased mobility;Decreased cognition;Decreased knowledge of use of DME;Pain PT Treatment Interventions: DME instruction;Gait training;Stair training;Functional mobility training;Therapeutic activities;Balance training;Neuromuscular re-education;Cognitive remediation;Patient/family education     PT Goals(Current goals can be found in the care plan section) Acute Rehab PT Goals Patient Stated Goal: uanble  PT Goal Formulation: Patient unable to participate in goal setting Time For Goal Achievement: 08/25/12 Potential to Achieve Goals: Good  Visit Information  Last PT Received On: 08/18/12 Assistance Needed: +1 (if take RW) History of Present Illness: Pt adm s/p unwitnessed fall (went to get her mail; found by passerby in the ditch beside road; apparently hit her head on concrete pipe in ditch--all per daughter). Pt with Lt temporal-parietal hematoma, medial Lt occipital hematoma, basilar skull fx, Rt orbital and maxillary fxs. Pt was intubated with extubation 7/26. GCS 9-10 on admission.       Prior Functioning  Home Living Family/patient expects to be discharged to:: Private residence Living Arrangements: Spouse/significant other;Children Available Help at Discharge: Family;Friend(s);Available 24 hours/day (daughter recently retired--reports they can get help as need) Type of Home: House Home Access: Stairs to enter [employer] of Steps: 1 Entrance Stairs-Rails: None Home Layout: One level Home  Equipment: Walker - 2 wheels  Additional Comments: RW can be borrowed from a family member Prior Function Level of Independence: Independent Communication Communication: Expressive difficulties;Receptive difficulties Dominant Hand: Right    Cognition  Cognition Arousal/Alertness: Awake/alert Behavior During Therapy: WFL for tasks assessed/performed Overall Cognitive Status: Difficult to assess Area of Impairment: Following commands;Orientation Orientation Level: Place;Time Following Commands: Follows one step commands inconsistently General Comments: Knew she'd been in an "accident" and with cues could state she was walking; knew she was in the hospital, yet thought it was [name]; demonstrated good safety awareness with cautious rise to standing, slow gait velocity (able to incr with cues) Difficult to assess due to: Impaired communication    Extremity/Trunk Assessment Upper Extremity Assessment Upper Extremity Assessment: Defer to OT evaluation Lower Extremity Assessment Lower Extremity Assessment: Overall WFL for tasks assessed (some difficulty with MMTdue to aphasia) Cervical / Trunk Assessment Cervical / Trunk Assessment: Normal   Balance Balance Balance Assessed: Yes Static Sitting Balance Static Sitting - Balance Support: No upper extremity supported;Feet supported Static Sitting - Level of Assistance: 5: Stand by assistance Static Standing Balance Static Standing - Balance Support: Right upper extremity supported Static Standing - Level of Assistance: 4: Min assist Tandem Stance - Right Leg: 15 (very light HHA on Rt) [place]berg - Eyes Opened: 30 [place] - Eyes Closed: 15 (slight > normal ant-post sway) High Level Balance High Level Balance Activites: Direction changes;Turns;Sudden stops High Level Balance Comments: all completed on command with no LOB  End of Session PT - End of Session Equipment Utilized During Treatment: Gait belt Activity Tolerance: Patient tolerated  treatment well Patient left: in chair;with call bell/phone within reach;with family/visitor present Nurse Communication: Mobility status  GP     Theresa Lynch 08/18/2012, 12:03 PM Pager [phone]

## 2012-08-18 NOTE — Progress Notes (Signed)
Subjective: Patient reports more alert still with word finding difficulties  Objective: Vital signs in last 24 hours: Temp:  [97.5 F (36.4 C)-99.1 F (37.3 C)] 98.9 F (37.2 C) (07/27 0800) Pulse Rate:  [78-111] 88 (07/27 0600) Resp:  [16-27] 16 (07/27 0600) BP: (128-188)/(56-139) 157/77 mmHg (07/27 0600) SpO2:  [97 %-100 %] 100 % (07/27 0600)  Intake/Output from previous day: 07/26 0701 - 07/27 0700 In: 1405.8 [I.V.:1405.8] Out: 2600 [Urine:2600] Intake/Output this shift: Total I/O In: 50 [I.V.:50] Out: 125 [Urine:125]  Pupils 3 mm equal and reactive extraocular movements full motor function good in both upper extremities and lower extremity  Lab Results:  Recent Labs  08/17/12 0530 08/18/12 0700  WBC 13.2* 15.4*  HGB 9.2* 10.3*  HCT 27.2* 29.5*  PLT 226 333   BMET  Recent Labs  08/16/12 0335 08/17/12 0530  NA 138 141  K 3.1* 4.1  CL 105 107  CO2 26 22  GLUCOSE 107* 127*  BUN 16 18  CREATININE 0.78 0.69  CALCIUM 8.6 9.2    Studies/Results: Dg Chest 2 View  08/17/2012   *RADIOLOGY REPORT*  Clinical Data: Cough status post fall.  CHEST - 2 VIEW  Comparison: 08/16/2012 and 08/15/2012.  Findings: Interval extubation with removal of the nasogastric tube. There are worsening left greater than right basilar air space opacities with associated small bilateral pleural effusions.  The pulmonary vasculature appears somewhat indistinct.  Heart size and mediastinal contours are stable.  There is no evidence of pneumothorax.  The subacromial space of both shoulders is narrowed consistent with chronic rotator cuff tears.  Postsurgical changes are present within the right humeral head. There are degenerative changes in the upper lumbar spine.  No acute osseous findings are seen.  IMPRESSION: Worsening left greater than right lower lobe air space disease and pleural effusions worrisome for pneumonia.  Possible mild superimposed edema.   Original Report Authenticated By: Carey Bullocks, M.D.   Ct Head Wo Contrast  08/17/2012   **ADDENDUM** CREATED: 08/17/2012 14:56:31  Additional Conclusions: 3. Extensive paranasal sinus disease redemonstrated with multiple air fluid levels, compatible with sinusitis. 4.  New left mastoid effusion.  **END ADDENDUM** SIGNED BY: Florencia Reasons, M.D.  08/17/2012   *RADIOLOGY REPORT*  Clinical Data: Follow-up evaluation for subarachnoid hemorrhage.  CT HEAD WITHOUT CONTRAST  Technique:  Contiguous axial images were obtained from the base of the skull through the vertex without contrast.  Comparison: 08/15/2012.  Findings: In the left temporoparietal region there is again a 3.0 x 2.2 cm high attenuation region compatible with intraparenchymal hemorrhage.  A small focus of hemorrhage in the medial aspect of the left occipital lobe is similar in size measuring 8 x 13 mm. Around both of these areas there is a some low attenuation, compatible with adjacent edema.  This results in some mild local mass effect, however, there is no frank midline shift at this time. Basal cisterns remain patent.  There is a small amount of extra- axial blood adjacent this overlying the posterior left cerebral cortex, however, this is less apparent in the prior study, most compatible with resolving subarachnoid hemorrhage.  The amount of subarachnoid hemorrhage overlying the right frontotemporal region also appears decreased compared to the prior study.  No new sites of intraparenchymal or extra-axial blood are noted.  No hydrocephalous. A left mastoid effusion is new compared to the prior study.  Right mastoid is well pneumatized.  There is multi focal mucosal thickening throughout the paranasal sinuses bilaterally, there is  a large air-fluid level within the right maxillary sinus.  IMPRESSION: 1.  Slight decrease in volume of subarachnoid hemorrhage, as above. 2.  Intraparenchymal hemorrhages in the left temporoparietal region and medial left occipital lobe appear similar to the  prior study, surrounded by some mild edema in both locations.   Original Report Authenticated By: Trudie Reed, M.D.   Dg Cerv Spine Flex&ext Only  08/17/2012   *RADIOLOGY REPORT*  Clinical Data: History of fall.  CERVICAL SPINE - FLEXION AND EXTENSION VIEWS ONLY  Comparison: No priors.  Findings: Lateral, flexion lateral and extension lateral views of the cervical spine demonstrate no definite acute displaced fractures.  No pathologic motion on the flexion or extension lateral views.  Mild reversal of normal cervical lordosis centered at the level of C4-C5, favored to be chronic and related to multilevel degenerative disc disease and cervical spondylosis. Alignment is otherwise anatomic.  Multilevel degenerative disc disease is noted, most severe at C3-C4, C4-C5, C5-C6 and C6-C7. Multilevel facet arthropathy is also noted.  IMPRESSION: 1.  No acute abnormality of the cervical spine. 2.  Multilevel degenerative disc disease and cervical spondylosis, as above.   Original Report Authenticated By: Trudie Reed, M.D.    Assessment/Plan: CT scan of head shows stable intracerebral contusion and left posterior parietal region. X-rays of the neck showed diffuse spondylosis otherwise stable  LOS: 4 days  patient's condition continues to improve slowly no need for neck collar. Patient may be mobilized. Only rehabilitation or SNF   Orion Mole J 08/18/2012, 10:05 AM

## 2012-08-18 NOTE — Progress Notes (Signed)
Patient ID: Theresa Lynch, female   DOB: [birth year], 79 y.o.   MRN: [id number]   LOS: 4 days   Subjective: Doing much better. Denies pain. Wants home meds for her tremors.   Objective: Vital signs in last 24 hours: Temp:  [97.5 F (36.4 C)-99.1 F (37.3 C)] 99 F (37.2 C) (07/27 0444) Pulse Rate:  [78-111] 88 (07/27 0600) Resp:  [16-27] 16 (07/27 0600) BP: (128-188)/(56-139) 157/77 mmHg (07/27 0600) SpO2:  [96 %-100 %] 100 % (07/27 0600)    Laboratory  CBC -- Pending   Physical Exam General appearance: alert and no distress Resp: clear to auscultation bilaterally Cardio: regular rate and rhythm GI: normal findings: bowel sounds normal and soft, non-tender Neuro: Ox3, A&A, aphasia much improved   Assessment/Plan: Fall and found down  TBI/SAH/R temporal ICC/basilar skull FX - TBI team  R orbit FX, R max sinus FX, frontal sinus FX - Non-operative  Facial lacs -- Local care  ABL anemia - Stable, improved  ID -- Cough improved, WBC elevated. Check UA. FEN - Home meds VTE -- SCD's  Dispo - Transfer to SDU, TBI team, likely CIR next week.    Freeman Caldron, PA-C Pager: [phone] General Trauma PA Pager: [phone]   08/18/2012

## 2012-08-18 NOTE — Progress Notes (Addendum)
Speech Language Pathology Dysphagia Treatment Patient Details Name: SHAKEYLA GIEBLER MRN: [id number] DOB: 07/10/1929 Today's Date: 08/18/2012 Time: 8469-6295 SLP Time Calculation (min): 13 min  Assessment / Plan / Recommendation Clinical Impression  Pt seen for skilled dysphagia treatment to determine tolerance of po intake and readiness for dietary advancement.  Pt with approx 25% intake of breakfast per RN and she consumed her mashed potatoes for lung.  Daughter present reports pt was able to self feed, which slp encouraged for neural input.    Observed pt self feeding graham cracker with good tolerance (minimally slow mastication) and no oral stasis.  Per family, pt eats slowly at home.  She continues with impulsivity with movement *pt sat upright in chair abruptly and would benefit from full supervision to maximize airway protection with po intake.    Pt's daughter reports improved language/communication, pt admits to ongoing difficulties.  Pt is also HOH.     Diet Recommendation  Initiate / Change Diet: Dysphagia 3 (mechanical soft);Thin liquid (ground meats)    SLP Plan Continue with current plan of care   Pertinent Vitals/Pain Afebrile, decreased   Swallowing Goals  SLP Swallowing Goals Patient will consume recommended diet without observed clinical signs of aspiration with: Supervision/safety;Moderate assistance Swallow Study Goal #1 - Progress: Progressing toward goal Patient will utilize recommended strategies during swallow to increase swallowing safety with: Supervision/safety;Moderate assistance Swallow Study Goal #2 - Progress: Progressing toward goal  General Temperature Spikes Noted: No Respiratory Status: Room air Behavior/Cognition: Alert;Cooperative;Pleasant mood;Hard of hearing;Requires cueing Oral Cavity - Dentition: Adequate natural dentition Patient Positioning: Upright in bed  Oral Cavity - Oral Hygiene Does patient have any of the following "at risk" factors?:  None of the above   Dysphagia Treatment Treatment focused on: Skilled observation of diet tolerance;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

## 2012-08-19 DIAGNOSIS — W19XXXA Unspecified fall, initial encounter: Secondary | ICD-10-CM

## 2012-08-19 DIAGNOSIS — S069X9A Unspecified intracranial injury with loss of consciousness of unspecified duration, initial encounter: Secondary | ICD-10-CM

## 2012-08-19 NOTE — Progress Notes (Signed)
Pt txing to 4N 31, report called to RN, VSS, family aware of new room number

## 2012-08-19 NOTE — Progress Notes (Signed)
Rehab Admissions Coordinator Note:  Patient was screened by Clois Dupes for appropriateness for an Inpatient Acute Rehab Consult.  At this time, we are recommending Inpatient Rehab consult. I will order.  Clois Dupes 08/19/2012, 8:13 AM  I can be reached at [phone].

## 2012-08-19 NOTE — Progress Notes (Addendum)
Physical Therapy Treatment Patient Details Name: Theresa Lynch MRN: [id number] DOB: February 21, 1929 Today's Date: 08/19/2012 Time: 1531-1600 PT Time Calculation (min): 29 min  PT Assessment / Plan / Recommendation  History of Present Illness Pt adm s/p unwitnessed fall (went to get her mail; found by passerby in the ditch beside road; apparently hit her head on concrete pipe in ditch--all per daughter). Pt with Lt temporal-parietal hematoma, medial Lt occipital hematoma, basilar skull fx, Rt orbital and maxillary fxs. Pt was intubated with extubation 7/26. GCS 9-10 on admission.   Clinical Impression Pt more restless, easily frustrated, and nearly agitated during session this p.m. Per family and RN, pt had a lot more stimulation today and likely contributed to her decline in performance today compared to 7/27. Pt currently a [place] Level IV-V (confused, agitated to confused, inappropriate). Will benefit from inpt rehab stay.   PT Comments     Follow Up Recommendations  CIR;Supervision/Assistance - 24 hour     Does the patient have the potential to tolerate intense rehabilitation     Barriers to Discharge        Equipment Recommendations  Other (comment) (TBA)    Recommendations for Other Services    Frequency Min 4X/week   Progress towards PT Goals Progress towards PT goals: Not progressing toward goals - comment (pt more fatigued; less focused; easily frustrated)  Plan Current plan remains appropriate    Precautions / Restrictions Precautions Precautions: Fall Precaution Comments: daughter reports no h/o imbalance PTA--unclear why she fell   Pertinent Vitals/Pain HR 90-100s; no signs of pain    Mobility  Bed Mobility Bed Mobility: Supine to Sit;Sitting - Scoot to [place] of Bed;Sit to Supine Supine to Sit: 5: Supervision;4: Min guard;HOB flat;HOB elevated Sitting - Scoot to [place] of Bed: 4: Min guard Sit to Supine: 5: Supervision;HOB flat Details for Bed Mobility Assistance: pt  coming to EOB on arrival (daughter present and trying to stop her--bed alarming) HOB elevated with rail; on return to bed, pt not attending to PT and need to sit and remove belt. Once lying down, asked her to sit up so I could remove it and she required incr time and effort. Transfers Transfers: Sit to Stand;Stand to Sit Sit to Stand: 4: Min guard;With upper extremity assist;From bed;From toilet Stand to Sit: 4: Min assist;With upper extremity assist;To bed;To toilet Details for Transfer Assistance: x2 with poor safety Ambulation/Gait Ambulation/Gait Assistance: 3: Mod assist Ambulation Distance (Feet): 150 Feet Assistive device: Rolling walker Ambulation/Gait Assistance Details: Pt not attending to position with RW (herself far too far behind walker despite instructions; also running into doorframe on Rt and Lt); Unsteady with turns Gait Pattern: Step-through pattern;Decreased stride length Gait velocity: decr    Exercises     PT Diagnosis:    PT Problem List:   PT Treatment Interventions:     PT Goals (current goals can now be found in the care plan section)    Visit Information  Last PT Received On: 08/19/12 Assistance Needed: +1 History of Present Illness: Pt adm s/p unwitnessed fall (went to get her mail; found by passerby in the ditch beside road; apparently hit her head on concrete pipe in ditch--all per daughter). Pt with Lt temporal-parietal hematoma, medial Lt occipital hematoma, basilar skull fx, Rt orbital and maxillary fxs. Pt was intubated with extubation 7/26. GCS 9-10 on admission.    Subjective Data  Subjective: [name]...hmhm (shaking head)   Cognition  Cognition Arousal/Alertness: Awake/alert Behavior During Therapy: Restless;Impulsive (climbing  out of bed despite alarm; wants bathroom) Overall Cognitive Status: Impaired/Different from baseline Area of Impairment: Following commands;Orientation;Attention;Memory;Safety/judgement;Awareness;Problem  solving Orientation Level: Place;Time;Situation (see Subjective--saying [name], yet ?word-finding) Current Attention Level: Sustained (up to 20 seconds with pericare) Memory: Decreased short-term memory Following Commands: Follows one step commands inconsistently Safety/Judgement: Decreased awareness of safety;Decreased awareness of deficits Awareness: Intellectual Problem Solving: Requires verbal cues;Requires tactile cues General Comments: RW got stuck on door frame on Lt x1 and Rt x1, each time pt unable to see why she could not move forward and became easily frustrated (towards agitation). ?visually could not see (despite appearing to look downward as instructed) vs decr attention. Pt was able to problem-solve which way she should walk down the hall to find room 10 (passed room 8, then 7 and she was able to reason she should turn around to find 10).  Difficult to assess due to: Impaired Engineer, structural Standing - Balance Support: Bilateral upper extremity supported Static Standing - Level of Assistance: 4: Min assist  End of Session PT - End of Session Equipment Utilized During Treatment: Gait belt Activity Tolerance: Patient limited by fatigue;Treatment limited secondary to agitation Patient left: in bed;with call bell/phone within reach;with bed alarm set;with family/visitor present Nurse Communication: Mobility status;Other (comment) (slight agitation; likely needs to rest)   GP     Tocara Mennen 08/19/2012, 4:22 PM Pager [phone]

## 2012-08-19 NOTE — Progress Notes (Signed)
Occupational Therapy Evaluation Patient Details Name: Theresa Lynch MRN: [id number] DOB: 1929-12-30 Today's Date: 08/19/2012 Time: 6295-2841 OT Time Calculation (min): 39 min  OT Assessment / Plan / Recommendation History of present illness Pt adm s/p unwitnessed fall (went to get her mail; found by passerby in the ditch beside road; apparently hit her head on concrete pipe in ditch--all per daughter). Pt with Lt temporal-parietal hematoma, medial Lt occipital hematoma, basilar skull fx, Rt orbital and maxillary fxs. Pt was intubated with extubation 7/26. GCS 9-10 on admission.   Clinical Impression   PTA, pt independent with all ADL and mobility. Pt presents with significant cognitive, visual and balance deficits, significantly impacting her ADL and mobility status. Pt is excellent CIR candidate. Very supportive family who can provide 24/7 care after D/C.Pt will benefit from skilled OT services to facilitate D/C to next venue due to below deficits.    OT Assessment  Patient needs continued OT Services    Follow Up Recommendations  CIR    Barriers to Discharge      Equipment Recommendations  Tub/shower bench;3 in 1 bedside comode    Recommendations for Other Services Rehab consult  Frequency  Min 3X/week    Precautions / Restrictions Precautions Precautions: Fall Precaution Comments: daughter reports no h/o imbalance PTA--unclear why she fell Restrictions Weight Bearing Restrictions: No   Pertinent Vitals/Pain no apparent distress     ADL  Eating/Feeding: Supervision/safety;Set up Where Assessed - Eating/Feeding: Edge of bed Grooming: Moderate assistance Where Assessed - Grooming: Supported standing Upper Body Bathing: Moderate assistance Where Assessed - Upper Body Bathing: Unsupported sitting Lower Body Bathing: Moderate assistance Where Assessed - Lower Body Bathing: Supported sit to stand Upper Body Dressing: Moderate assistance Where Assessed - Upper Body Dressing:  Unsupported sitting Lower Body Dressing: Moderate assistance Where Assessed - Lower Body Dressing: Supported sit to Pharmacist, hospital: Moderate assistance Toilet Transfer Method: Sit to stand;Other (comment) (ambulating) Toilet Transfer Equipment: Bedside commode Toileting - Clothing Manipulation and Hygiene: Moderate assistance Where Assessed - Toileting Clothing Manipulation and Hygiene: Sit to stand from 3-in-1 or toilet Equipment Used: Rolling walker;Gait belt Transfers/Ambulation Related to ADLs: MOd A at times due to balance deficits ADL Comments: functional decline.    OT Diagnosis: Generalized weakness;Cognitive deficits;Disturbance of vision;Acute pain;Altered mental status  OT Problem List: Decreased strength;Decreased activity tolerance;Impaired balance (sitting and/or standing);Impaired vision/perception;Decreased coordination;Decreased cognition;Decreased safety awareness;Decreased knowledge of use of DME or AE;Decreased knowledge of precautions;Cardiopulmonary status limiting activity OT Treatment Interventions: Self-care/ADL training;Therapeutic exercise;DME and/or AE instruction;Therapeutic activities;Cognitive remediation/compensation;Visual/perceptual remediation/compensation;Patient/family education;Balance training   OT Goals(Current goals can be found in the care plan section) Acute Rehab OT Goals Patient Stated Goal: to go home OT Goal Formulation: With patient/family Time For Goal Achievement: 09/02/12 Potential to Achieve Goals: Good  Visit Information  Last OT Received On: 08/19/12 Assistance Needed: +1 (if take RW) History of Present Illness: Pt adm s/p unwitnessed fall (went to get her mail; found by passerby in the ditch beside road; apparently hit her head on concrete pipe in ditch--all per daughter). Pt with Lt temporal-parietal hematoma, medial Lt occipital hematoma, basilar skull fx, Rt orbital and maxillary fxs. Pt was intubated with extubation 7/26. GCS  9-10 on admission.       Prior Functioning     Home Living Family/patient expects to be discharged to:: Private residence Living Arrangements: Spouse/significant other;Children Available Help at Discharge: Family;Friend(s);Available 24 hours/day (daughter recently retired--reports they can get help as need) Type of Home: House Home Access: Stairs  to enter Entrance Stairs-Number of Steps: 1 Entrance Stairs-Rails: None Home Layout: One level Home Equipment: Walker - 2 wheels Additional Comments: RW can be borrowed from a family member  Lives With: Spouse Prior Function Level of Independence: Independent Communication Communication: Expressive difficulties;Receptive difficulties Dominant Hand: Right         Vision/Perception Vision - History Baseline Vision: Wears glasses only for reading Visual History: Glaucoma Patient Visual Report: Blurring of vision Vision - Assessment Eye Alignment: Impaired (comment) Vision Assessment: Vision tested Ocular Range of Motion: Within Functional Limits Alignment/Gaze Preference: Within Defined Limits Tracking/Visual Pursuits: Decreased smoothness of horizontal tracking;Requires cues, head turns, or add eye shifts to track;Impaired - to be further tested in functional context Saccades: Decreased speed of saccadic movement;Impaired - to be further tested in functional context;Additional head turns occurred during testing;Additional eye shifts occurred during testing Convergence: Impaired - to be further tested in functional context.  Visual Fields: Impaired - to be further tested in functional context Diplopia Assessment: Other (comment) (will further assess. no c/o diplopia) Depth Perception: decreased accuracy when reaching Additional Comments: difficulty with abduction of L eye. will further assess Perception Perception: Impaired Praxis Praxis: Impaired Praxis Impairment Details: Initiation;Perseveration;Motor planning Praxis-Other  Comments: delay in processing. perseveration noted   Cognition  Cognition Arousal/Alertness: Awake/alert Behavior During Therapy: Flat affect Overall Cognitive Status: Impaired/Different from baseline Area of Impairment: Following commands;Orientation;Attention;Memory;Safety/judgement;Awareness;Problem solving Orientation Level: Place;Time;Situation Current Attention Level: Sustained Memory: Decreased recall of precautions;Decreased short-term memory Following Commands: Follows one step commands with increased time Safety/Judgement: Decreased awareness of safety;Decreased awareness of deficits Awareness: Intellectual Problem Solving: Slow processing;Decreased initiation;Difficulty sequencing;Requires verbal cues General Comments: general confusion.    Extremity/Trunk Assessment Upper Extremity Assessment Upper Extremity Assessment: Generalized weakness Lower Extremity Assessment Lower Extremity Assessment: Defer to PT evaluation Cervical / Trunk Assessment Cervical / Trunk Assessment: Normal     Mobility Bed Mobility Bed Mobility: Not assessed Transfers Sit to Stand: 4: Min assist;With upper extremity assist Stand to Sit: With upper extremity assist;4: Min assist Details for Transfer Assistance: unsafe mobility     Exercise     Balance Balance Balance Assessed: Yes Static Sitting Balance Static Sitting - Balance Support: No upper extremity supported;Feet supported Static Sitting - Level of Assistance: 5: Stand by assistance Static Standing Balance Static Standing - Balance Support: Right upper extremity supported;During functional activity Static Standing - Level of Assistance: 4: Min assist Tandem Stance - Right Leg:  (very light HHA on Rt) [place] - Eyes Closed:  (slight > normal ant-post sway) Dynamic Standing Balance Dynamic Standing - Balance Support: No upper extremity supported;During functional activity Dynamic Standing - Level of Assistance: 3: Mod  assist Dynamic Standing - Balance Activities: Other (comment) (washing hands) Dynamic Standing - Comments: posterior lean. Pt unaware of deficit   End of Session OT - End of Session Equipment Utilized During Treatment: Gait belt;Rolling walker Activity Tolerance: Patient tolerated treatment well Patient left: in bed;with call bell/phone within reach;with bed alarm set;with family/visitor present Nurse Communication: Mobility status  GO     Manal Kreutzer,HILLARY 08/19/2012, 1:27 PM [facility], OTR/L  [phone] 08/19/2012

## 2012-08-19 NOTE — Progress Notes (Signed)
Patient ID: Theresa Lynch, female   DOB: 08-Nov-1929, 79 y.o.   MRN: [id number]   LOS: 5 days   Subjective: No c/o, denies pain. RN reports pt still having some confusion. Still with urinary frequency but getting up to the BR with RN ~q2h.   Objective: Vital signs in last 24 hours: Temp:  [98.9 F (37.2 C)-100.4 F (38 C)] 99.2 F (37.3 C) (07/28 0710) Pulse Rate:  [75-90] 75 (07/28 0300) Resp:  [12-21] 12 (07/28 0710) BP: (135-190)/(68-122) 170/83 mmHg (07/28 0710) SpO2:  [98 %-100 %] 98 % (07/28 0300)    Physical Exam General appearance: alert and no distress Resp: clear to auscultation bilaterally Cardio: regular rate and rhythm GI: normal findings: bowel sounds normal and soft, non-tender   Assessment/Plan: Fall and found down  TBI/SAH/R temporal ICC/basilar skull FX - TBI team  R orbit FX, R max sinus FX, frontal sinus FX - Non-operative  Facial lacs -- Local care  ABL anemia - Stable FEN - Home meds  VTE -- SCD's  Dispo - Transfer to floor, CIR when bed available    Freeman Caldron, PA-C Pager: [phone] General Trauma PA Pager: [phone]   08/19/2012

## 2012-08-19 NOTE — Consult Note (Signed)
Physical Medicine and Rehabilitation Consult  Reason for Consult:  TBI with SAH, IPH, skull/facial fractures, aphasia, balance deficits.  Referring Physician:  Trauma MD.    HPI: Theresa Lynch is a 79 y.o. female with history of glaucoma, OA, who was found down in a ditch by her mailbox by a passerby on 08/15/12. She had apparently hit her head on concrete pipe in ditch and nonverbal with large hematoma and lacerations to left side of head. GCS 9-10 at admission. Work up with multiple right facial fractures and CT head revealed  extensive subarachnoid hemorrhage, predominately within the suprasellar cistern and right sylvian fissure, skull base fracture along right foramen and left 2.5 cm temporo-parietal IPH   (counter coup). CTA head/neck negative for dissection or aneurysms. Patient with decrease in LOC and intubated past admission. She was evaluated by Dr. Wynetta Emery and serial CCT recommended. Dr Sheral Apley felt that facial fractures minimally displaced and no surgical intervention needed. Patient extubated without difficulty but has had agitation and confusion. ST evaluation done and revealed patient to have expressive and receptive language deficits and D1 diet with thin liquids recommended due to impulsivity. PT evaluation done ane patient with unsteady gait. MD, PT, ST recommending CIR.    Review of Systems  HENT: Negative for hearing loss and neck pain.   Eyes: Negative for blurred vision and double vision.  Respiratory: Negative for cough.   Cardiovascular: Negative for chest pain and palpitations.  Gastrointestinal: Negative for heartburn, nausea and vomiting.  Genitourinary: Negative for urgency and frequency.  Musculoskeletal: Negative for myalgias.  Neurological: Negative for headaches.   Past Medical History  Diagnosis Date  . Glaucoma   . Depression   . Hyperlipidemia   . Arthritis   . PONV (postoperative nausea and vomiting)    Past Surgical History  Procedure Laterality Date   . Rotator cuff repair  1999   History reviewed. No pertinent family history.  Social History:  Married. 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 husband in good health. She reports that she has never smoked. She does not have any smokeless tobacco history on file. She reports that she drinks about 4.2 ounces of alcohol per week. She reports that she does not use illicit drugs.   Allergies: No Known Allergies  Medications Prior to Admission  Medication Sig Dispense Refill  . atorvastatin (LIPITOR) 10 MG tablet Take 10 mg by mouth daily.      . hydrochlorothiazide (HYDRODIURIL) 25 MG tablet Take 25 mg by mouth daily.      Marland Kitchen PARoxetine (PAXIL) 20 MG tablet Take 40 mg by mouth at bedtime.      . timolol (TIMOPTIC) 0.5 % ophthalmic solution Place 1 drop into both eyes at bedtime.      . traZODone (DESYREL) 50 MG tablet Take 50 mg by mouth at bedtime.      . vitamin B-12 (CYANOCOBALAMIN) 500 MCG tablet Take 500 mcg by mouth daily.        Home: Home Living Family/patient expects to be discharged to:: Private residence Living Arrangements: Spouse/significant other;Children Available Help at Discharge: Family;Friend(s);Available 24 hours/day (daughter recently retired--reports they can get help as need) Type of Home: House Home Access: Stairs to enter [employer] of Steps: 1 Entrance Stairs-Rails: None Home Layout: One level Home Equipment: Walker - 2 wheels Additional Comments: RW can be borrowed from a family member  Lives With: Spouse  Functional History: Prior Function Vocation: Retired Functional Status:  Mobility:  Bed Mobility Bed Mobility: Not assessed Transfers Transfers: Sit to Stand;Stand to Sit Sit to Stand: 4: Min assist;With upper extremity assist Stand to Sit: 4: Min guard;With upper extremity assist Ambulation/Gait Ambulation/Gait Assistance: 4: Min assist Ambulation Distance (Feet): 100 Feet Assistive device: 1 person  hand held assist Ambulation/Gait Assistance Details: initially very slow gait; able to incr speed as instructed; slightly unsteady Gait Pattern: Step-through pattern;Decreased stride length Gait velocity: decr    ADL:    Cognition: Cognition Overall Cognitive Status: Difficult to assess Orientation Level: Oriented to person Cognition Arousal/Alertness: Awake/alert Behavior During Therapy: WFL for tasks assessed/performed Overall Cognitive Status: Difficult to assess Area of Impairment: Following commands;Orientation Orientation Level: Place;Time Following Commands: Follows one step commands inconsistently General Comments: Knew she'd been in an "accident" and with cues could state she was walking; knew she was in the hospital, yet thought it was [name]; demonstrated good safety awareness with cautious rise to standing, slow gait velocity (able to incr with cues) Difficult to assess due to: Impaired communication  Blood pressure 170/83, pulse 75, temperature 99.2 F (37.3 C), temperature source Oral, resp. rate 12, height 5\' 1"  (1.549 m), weight 64.9 kg (143 lb 1.3 oz), SpO2 98.00%.   Physical Exam  Nursing note and vitals reviewed. Constitutional: She appears well-developed and well-nourished.  Right facial ecchymosis with racoon eyes and battle sign. Nasal bridge with abrasions with ecchymosis. Right forehead laceration with sutures in place.   HENT:  Head: Normocephalic.  Eyes: Pupils are equal, round, and reactive to light. Right eye exhibits discharge.  Neck: Normal range of motion. Neck supple.  Cardiovascular: Normal rate.   Pulmonary/Chest: Effort normal and breath sounds normal.  Abdominal: Soft. Bowel sounds are normal. She exhibits no distension. There is no tenderness.  Neurological: She is alert.  Very alert. Speech clear but slow with delayed processing.  Able to answer basic questions--mild expressive deficits. "--[place]"  Amnesia of leading to  fall and hospitalization. Has receptive language and processing deficits. Delayed and often  Unable to follow 2 step commands.  perseveration with motor and verbal apraxia noted.  BLE with ataxic movements.   Skin: Skin is warm and dry.    No results found for this or any previous visit (from the past 24 hour(s)). Dg Chest 2 View  08/17/2012   *RADIOLOGY REPORT*  Clinical Data: Cough status post fall.  CHEST - 2 VIEW  Comparison: 08/16/2012 and 08/15/2012.  Findings: Interval extubation with removal of the nasogastric tube. There are worsening left greater than right basilar air space opacities with associated small bilateral pleural effusions.  The pulmonary vasculature appears somewhat indistinct.  Heart size and mediastinal contours are stable.  There is no evidence of pneumothorax.  The subacromial space of both shoulders is narrowed consistent with chronic rotator cuff tears.  Postsurgical changes are present within the right humeral head. There are degenerative changes in the upper lumbar spine.  No acute osseous findings are seen.  IMPRESSION: Worsening left greater than right lower lobe air space disease and pleural effusions worrisome for pneumonia.  Possible mild superimposed edema.   Original Report Authenticated By: Carey Bullocks, M.D.   Ct Head Wo Contrast  08/17/2012   **ADDENDUM** CREATED: 08/17/2012 14:56:31  Additional Conclusions: 3. Extensive paranasal sinus disease redemonstrated with multiple air fluid levels, compatible with sinusitis. 4.  New left mastoid effusion.  **END ADDENDUM** SIGNED BY: Florencia Reasons, M.D.  08/17/2012   *RADIOLOGY REPORT*  Clinical Data: Follow-up evaluation for subarachnoid hemorrhage.  CT HEAD WITHOUT CONTRAST  Technique:  Contiguous axial images were obtained from the base of the skull through the vertex without contrast.  Comparison: 08/15/2012.  Findings: In the left temporoparietal region there is again a 3.0 x 2.2 cm high attenuation region  compatible with intraparenchymal hemorrhage.  A small focus of hemorrhage in the medial aspect of the left occipital lobe is similar in size measuring 8 x 13 mm. Around both of these areas there is a some low attenuation, compatible with adjacent edema.  This results in some mild local mass effect, however, there is no frank midline shift at this time. Basal cisterns remain patent.  There is a small amount of extra- axial blood adjacent this overlying the posterior left cerebral cortex, however, this is less apparent in the prior study, most compatible with resolving subarachnoid hemorrhage.  The amount of subarachnoid hemorrhage overlying the right frontotemporal region also appears decreased compared to the prior study.  No new sites of intraparenchymal or extra-axial blood are noted.  No hydrocephalous. A left mastoid effusion is new compared to the prior study.  Right mastoid is well pneumatized.  There is multi focal mucosal thickening throughout the paranasal sinuses bilaterally, there is a large air-fluid level within the right maxillary sinus.  IMPRESSION: 1.  Slight decrease in volume of subarachnoid hemorrhage, as above. 2.  Intraparenchymal hemorrhages in the left temporoparietal region and medial left occipital lobe appear similar to the prior study, surrounded by some mild edema in both locations.   Original Report Authenticated By: Trudie Reed, M.D.   Dg Cerv Spine Flex&ext Only  08/17/2012   *RADIOLOGY REPORT*  Clinical Data: History of fall.  CERVICAL SPINE - FLEXION AND EXTENSION VIEWS ONLY  Comparison: No priors.  Findings: Lateral, flexion lateral and extension lateral views of the cervical spine demonstrate no definite acute displaced fractures.  No pathologic motion on the flexion or extension lateral views.  Mild reversal of normal cervical lordosis centered at the level of C4-C5, favored to be chronic and related to multilevel degenerative disc disease and cervical spondylosis.  Alignment is otherwise anatomic.  Multilevel degenerative disc disease is noted, most severe at C3-C4, C4-C5, C5-C6 and C6-C7. Multilevel facet arthropathy is also noted.  IMPRESSION: 1.  No acute abnormality of the cervical spine. 2.  Multilevel degenerative disc disease and cervical spondylosis, as above.   Original Report Authenticated By: Trudie Reed, M.D.    Assessment/Plan: Diagnosis: TBI after fall 1. Does the need for close, 24 hr/day medical supervision in concert with the patient's rehab needs make it unreasonable for this patient to be served in a less intensive setting? Yes 2. Co-Morbidities requiring supervision/potential complications: facial/skull fx'es 3. Due to bladder management, bowel management, safety, skin/wound care, disease management, medication administration, pain management and patient education, does the patient require 24 hr/day rehab nursing? Yes 4. Does the patient require coordinated care of a physician, rehab nurse, PT (1-2 hrs/day, 5 days/week), OT (1-2 hrs/day, 5 days/week) and SLP (1-2 hrs/day, 5 days/week) to address physical and functional deficits in the context of the above medical diagnosis(es)? Yes Addressing deficits in the following areas: balance, endurance, locomotion, strength, transferring, bowel/bladder control, bathing, dressing, feeding, grooming, toileting, cognition, speech, language and psychosocial support 5. Can the patient actively participate in an intensive therapy program of at least 3 hrs of therapy per day at least 5 days per week? Yes 6. The potential for patient to make measurable gains while on inpatient rehab is excellent 7. Anticipated functional  outcomes upon discharge from inpatient rehab are supervision to mod I with PT, supervision to mod I with OT, supervision with SLP. 8. Estimated rehab length of stay to reach the above functional goals is: 7-10 days 9. Does the patient have adequate social supports to accommodate these  discharge functional goals? Yes 10. Anticipated D/C setting: Home 11. Anticipated post D/C treatments: Outpt therapy 12. Overall Rehab/Functional Prognosis: excellent  RECOMMENDATIONS: This patient's condition is appropriate for continued rehabilitative care in the following setting: CIR Patient has agreed to participate in recommended program. Yes Note that insurance prior authorization may be required for reimbursement for recommended care.  Comment: Rehab RN to follow up.   Ranelle Oyster, MD, [place]     08/19/2012

## 2012-08-19 NOTE — Progress Notes (Signed)
I have begun insurance approval to admit pt to inpt rehab. I will follow up with pt and family in the morning. [phone]

## 2012-08-19 NOTE — Progress Notes (Signed)
Patient so much better.  Oriented and appropriate.  Hopefully she will be able to go to Rehab.  Moderately hypertensive today.  Only BP pill at home was HCTZ.  Would be okay for the patient to go to the floor/4N.  This patient has been seen and I agree with the findings and treatment plan.  Marta Lamas. Gae Bon, MD, FACS [phone] (pager) [phone] (direct pager) Trauma Surgeon

## 2012-08-19 NOTE — Progress Notes (Signed)
Speech Language Pathology Dysphagia Treatment Patient Details Name: BLU MCGLAUN MRN: [id number] DOB: 05/20/1929 Today's Date: 08/19/2012 Time: 2130-8657 SLP Time Calculation (min): 16 min  Assessment / Plan / Recommendation Clinical Impression  Treatment included observation and reiteration of swallow strategies and clinical rational with spouse and daughter present.  Prolonged mastication and transit with cracker.  She required moderate verbal cues for smaller sips with straw.  Temp has been elevation past several days and  unchanged lung sounds per RN documentation.  Cough exhibited 3-5 minutes after eating.  Not ready to advance to regular texture, continue thin liquids.  Will continue to follow pt. for dysphagia and cognitive therapy.     Diet Recommendation  Continue with Current Diet: Dysphagia 3 (mechanical soft);Thin liquid    SLP Plan Continue with current plan of care   Pertinent Vitals/Pain dysphagia   Swallowing Goals  SLP Swallowing Goals Patient will consume recommended diet without observed clinical signs of aspiration with: Supervision/safety;Moderate assistance Swallow Study Goal #1 - Progress: Progressing toward goal Patient will utilize recommended strategies during swallow to increase swallowing safety with: Supervision/safety;Moderate assistance Swallow Study Goal #2 - Progress: Progressing toward goal  General Temperature Spikes Noted: Yes Respiratory Status: Room air Behavior/Cognition: Alert;Cooperative;Pleasant mood;Hard of hearing;Requires cueing Oral Cavity - Dentition: Adequate natural dentition Patient Positioning: Upright in bed  Oral Cavity - Oral Hygiene Does patient have any of the following "at risk" factors?: Other - dysphagia Brush patient's teeth BID with toothbrush (using toothpaste with fluoride): Yes Patient is AT RISK - Oral Care Protocol followed (see row info): Yes   Dysphagia Treatment Treatment focused on: Skilled observation of diet  tolerance Treatment Methods/Modalities: Skilled observation Patient observed directly with PO's: Yes Type of PO's observed: Regular;Thin liquids Feeding: Able to feed self Liquids provided via: Straw Oral Phase Signs & Symptoms: Prolonged mastication;Prolonged bolus formation;Prolonged oral phase Pharyngeal Phase Signs & Symptoms: Delayed cough Type of cueing: Verbal Amount of cueing: Moderate        Breck Coons [employer] [phone]  08/19/2012

## 2012-08-20 ENCOUNTER — Inpatient Hospital Stay (HOSPITAL_COMMUNITY)
Admission: RE | Admit: 2012-08-20 | Discharge: 2012-08-21 | DRG: 945 | Disposition: A | Discharge status: Other Acute Inpatient Hospital | Payer: Medicare Other | Source: Intra-hospital | Attending: Physical Medicine & Rehabilitation | Admitting: Physical Medicine & Rehabilitation

## 2012-08-20 ENCOUNTER — Encounter (HOSPITAL_COMMUNITY): Payer: Self-pay | Admitting: Physical Medicine and Rehabilitation

## 2012-08-20 DIAGNOSIS — H9190 Unspecified hearing loss, unspecified ear: Secondary | ICD-10-CM | POA: Diagnosis present

## 2012-08-20 DIAGNOSIS — S02109A Fracture of base of skull, unspecified side, initial encounter for closed fracture: Secondary | ICD-10-CM | POA: Diagnosis present

## 2012-08-20 DIAGNOSIS — E876 Hypokalemia: Secondary | ICD-10-CM | POA: Diagnosis present

## 2012-08-20 DIAGNOSIS — R55 Syncope and collapse: Secondary | ICD-10-CM | POA: Diagnosis present

## 2012-08-20 DIAGNOSIS — Z5189 Encounter for other specified aftercare: Secondary | ICD-10-CM | POA: Diagnosis present

## 2012-08-20 DIAGNOSIS — W19XXXA Unspecified fall, initial encounter: Secondary | ICD-10-CM

## 2012-08-20 DIAGNOSIS — I609 Nontraumatic subarachnoid hemorrhage, unspecified: Secondary | ICD-10-CM | POA: Diagnosis present

## 2012-08-20 DIAGNOSIS — S066XAA Fracture of base of skull, unspecified side, initial encounter for closed fracture: Secondary | ICD-10-CM

## 2012-08-20 DIAGNOSIS — H409 Unspecified glaucoma: Secondary | ICD-10-CM | POA: Diagnosis present

## 2012-08-20 DIAGNOSIS — E871 Hypo-osmolality and hyponatremia: Secondary | ICD-10-CM | POA: Diagnosis present

## 2012-08-20 DIAGNOSIS — R269 Unspecified abnormalities of gait and mobility: Secondary | ICD-10-CM | POA: Diagnosis present

## 2012-08-20 DIAGNOSIS — I10 Essential (primary) hypertension: Secondary | ICD-10-CM | POA: Diagnosis present

## 2012-08-20 DIAGNOSIS — S0280XA Fracture of other specified skull and facial bones, unspecified side, initial encounter for closed fracture: Secondary | ICD-10-CM | POA: Diagnosis present

## 2012-08-20 DIAGNOSIS — S069XAA Unspecified intracranial injury with loss of consciousness status unknown, initial encounter: Secondary | ICD-10-CM

## 2012-08-20 DIAGNOSIS — F411 Generalized anxiety disorder: Secondary | ICD-10-CM | POA: Diagnosis present

## 2012-08-20 DIAGNOSIS — S069X9A Unspecified intracranial injury with loss of consciousness of unspecified duration, initial encounter: Secondary | ICD-10-CM

## 2012-08-20 DIAGNOSIS — D62 Acute posthemorrhagic anemia: Secondary | ICD-10-CM | POA: Diagnosis present

## 2012-08-20 DIAGNOSIS — S0292XA Unspecified fracture of facial bones, initial encounter for closed fracture: Secondary | ICD-10-CM

## 2012-08-20 DIAGNOSIS — F329 Major depressive disorder, single episode, unspecified: Secondary | ICD-10-CM | POA: Diagnosis present

## 2012-08-20 DIAGNOSIS — M129 Arthropathy, unspecified: Secondary | ICD-10-CM | POA: Diagnosis present

## 2012-08-20 DIAGNOSIS — E785 Hyperlipidemia, unspecified: Secondary | ICD-10-CM | POA: Diagnosis present

## 2012-08-20 DIAGNOSIS — R079 Chest pain, unspecified: Secondary | ICD-10-CM | POA: Diagnosis present

## 2012-08-20 DIAGNOSIS — F3289 Other specified depressive episodes: Secondary | ICD-10-CM | POA: Diagnosis present

## 2012-08-20 DIAGNOSIS — W010XXA Fall on same level from slipping, tripping and stumbling without subsequent striking against object, initial encounter: Secondary | ICD-10-CM | POA: Diagnosis present

## 2012-08-20 LAB — URINALYSIS, ROUTINE W REFLEX MICROSCOPIC
Ketones, ur: NEGATIVE mg/dL
Leukocytes, UA: NEGATIVE
Protein, ur: NEGATIVE mg/dL
Urobilinogen, UA: 1 mg/dL (ref 0.0–1.0)

## 2012-08-20 MED ORDER — PAROXETINE HCL 20 MG PO TABS
40.0000 mg | ORAL_TABLET | Freq: Every day | ORAL | Status: DC
  Administered 2012-08-20: 40 mg via ORAL
  Filled 2012-08-20 (×2): qty 2

## 2012-08-20 MED ORDER — TIMOLOL MALEATE 0.5 % OP SOLN
1.0000 [drp] | Freq: Every day | OPHTHALMIC | Status: DC
  Administered 2012-08-20: 1 [drp] via OPHTHALMIC
  Filled 2012-08-20: qty 5

## 2012-08-20 MED ORDER — PROCHLORPERAZINE 25 MG RE SUPP
12.5000 mg | Freq: Four times a day (QID) | RECTAL | Status: DC | PRN
  Filled 2012-08-20: qty 1

## 2012-08-20 MED ORDER — POLYETHYLENE GLYCOL 3350 17 G PO PACK
17.0000 g | PACK | Freq: Every day | ORAL | Status: DC | PRN
  Filled 2012-08-20: qty 1

## 2012-08-20 MED ORDER — ONDANSETRON HCL 4 MG/2ML IJ SOLN: 4.0000 mg | Freq: Four times a day (QID) | INTRAMUSCULAR | Status: DC | PRN

## 2012-08-20 MED ORDER — GUAIFENESIN-DM 100-10 MG/5ML PO SYRP: 5.0000 mL | ORAL_SOLUTION | Freq: Four times a day (QID) | ORAL | Status: DC | PRN

## 2012-08-20 MED ORDER — PROCHLORPERAZINE EDISYLATE 5 MG/ML IJ SOLN
5.0000 mg | Freq: Four times a day (QID) | INTRAMUSCULAR | Status: DC | PRN
  Filled 2012-08-20: qty 2

## 2012-08-20 MED ORDER — HYDROCODONE-ACETAMINOPHEN 5-325 MG PO TABS: 1.0000 | ORAL_TABLET | ORAL | Status: DC | PRN

## 2012-08-20 MED ORDER — BIOTENE DRY MOUTH MT LIQD: 15.0000 mL | Freq: Four times a day (QID) | OROMUCOSAL | Status: DC

## 2012-08-20 MED ORDER — ENSURE PUDDING PO PUDG
1.0000 | Freq: Three times a day (TID) | ORAL | Status: DC
  Administered 2012-08-20: 1 via ORAL

## 2012-08-20 MED ORDER — ATORVASTATIN CALCIUM 10 MG PO TABS
10.0000 mg | ORAL_TABLET | Freq: Every day | ORAL | Status: DC
  Administered 2012-08-21: 10 mg via ORAL
  Filled 2012-08-20 (×2): qty 1

## 2012-08-20 MED ORDER — ACETAMINOPHEN 325 MG PO TABS
325.0000 mg | ORAL_TABLET | ORAL | Status: DC | PRN
  Administered 2012-08-21: 650 mg via ORAL
  Filled 2012-08-20: qty 2

## 2012-08-20 MED ORDER — FLEET ENEMA 7-19 GM/118ML RE ENEM: 1.0000 | ENEMA | Freq: Once | RECTAL | Status: AC | PRN

## 2012-08-20 MED ORDER — TRAZODONE HCL 50 MG PO TABS
50.0000 mg | ORAL_TABLET | Freq: Every day | ORAL | Status: DC
  Administered 2012-08-20: 50 mg via ORAL
  Filled 2012-08-20: qty 1

## 2012-08-20 MED ORDER — HYDROCHLOROTHIAZIDE 25 MG PO TABS
25.0000 mg | ORAL_TABLET | Freq: Every day | ORAL | Status: DC
  Administered 2012-08-21: 25 mg via ORAL
  Filled 2012-08-20 (×2): qty 1

## 2012-08-20 MED ORDER — ALUM & MAG HYDROXIDE-SIMETH 200-200-20 MG/5ML PO SUSP: 30.0000 mL | ORAL | Status: DC | PRN

## 2012-08-20 MED ORDER — PROCHLORPERAZINE MALEATE 5 MG PO TABS
5.0000 mg | ORAL_TABLET | Freq: Four times a day (QID) | ORAL | Status: DC | PRN
  Filled 2012-08-20: qty 2

## 2012-08-20 MED ORDER — BACITRACIN ZINC 500 UNIT/GM EX OINT
TOPICAL_OINTMENT | Freq: Two times a day (BID) | CUTANEOUS | Status: DC
  Administered 2012-08-20: 31.5556 via TOPICAL
  Filled 2012-08-20 (×2): qty 28.35

## 2012-08-20 MED ORDER — ONDANSETRON HCL 4 MG PO TABS: 4.0000 mg | ORAL_TABLET | Freq: Four times a day (QID) | ORAL | Status: DC | PRN

## 2012-08-20 MED ORDER — BISACODYL 10 MG RE SUPP: 10.0000 mg | Freq: Every day | RECTAL | Status: DC | PRN

## 2012-08-20 MED ORDER — CYANOCOBALAMIN 500 MCG PO TABS
500.0000 ug | ORAL_TABLET | Freq: Every day | ORAL | Status: DC
  Administered 2012-08-21: 500 ug via ORAL
  Filled 2012-08-20 (×2): qty 1

## 2012-08-20 NOTE — Progress Notes (Signed)
Physical Therapy Treatment Patient Details Name: BRENDALEE MATTHIES MRN: [id number] DOB: Jun 07, 1929 Today's Date: 08/20/2012 Time: 4098-1191 PT Time Calculation (min): 32 min  PT Assessment / Plan / Recommendation  History of Present Illness Pt adm s/p unwitnessed fall (went to get her mail; found by passerby in the ditch beside road; apparently hit her head on concrete pipe in ditch--all per daughter). Pt with Lt temporal-parietal hematoma, medial Lt occipital hematoma, basilar skull fx, Rt orbital and maxillary fxs. Pt was intubated with extubation 7/26. GCS 9-10 on admission.   PT Comments   Progressing nicely today. Patient calm during our session and inconsistently following commands needing very simple and direct cues for attention to task. Behavior most consistent with [place] level VI. Patient remains confused however very appropriate during our session. Able to admit that she was confused today.   Follow Up Recommendations  CIR;Supervision/Assistance - 24 hour                 Frequency Min 4X/week   Progress towards PT Goals Progress towards PT goals: Progressing toward goals  Plan Current plan remains appropriate    Precautions / Restrictions Precautions Precautions: Fall Precaution Comments: daughter reports no h/o imbalance PTA--unclear why she fell Restrictions Weight Bearing Restrictions: No   Pertinent Vitals/Pain Denies pain    Mobility  Bed Mobility Bed Mobility: Supine to Sit;Sit to Supine Supine to Sit: 5: Supervision Sit to Supine: 5: Supervision Details for Bed Mobility Assistance: cues for efficiency Transfers Transfers: Sit to Stand;Stand to Sit Sit to Stand: 4: Min guard;With upper extremity assist;From bed Stand to Sit: 4: Min guard;With upper extremity assist;To bed Details for Transfer Assistance: hand over hand cueing for safe technique and sequencing, attentional cues for safety; initially with moderate posterior bias needing assist to steady and  cueing for anterior weight shift Ambulation/Gait Ambulation/Gait Assistance: 4: Min assist Ambulation Distance (Feet): 150 Feet Assistive device: Rolling walker Ambulation/Gait Assistance Details: cues for tall posture and safety with RW, increased lateral sway when distracted requiring minA for stability, following very simple 1 step command directional cues to navigate hallway Gait Pattern: Step-through pattern;Trunk flexed General Gait Details: downward gaze      PT Goals (current goals can now be found in the care plan section)    Visit Information  Last PT Received On: 08/20/12 Assistance Needed: +1 History of Present Illness: Pt adm s/p unwitnessed fall (went to get her mail; found by passerby in the ditch beside road; apparently hit her head on concrete pipe in ditch--all per daughter). Pt with Lt temporal-parietal hematoma, medial Lt occipital hematoma, basilar skull fx, Rt orbital and maxillary fxs. Pt was intubated with extubation 7/26. GCS 9-10 on admission.    Subjective Data      Cognition  Cognition Arousal/Alertness: Awake/alert Behavior During Therapy: WFL for tasks assessed/performed Overall Cognitive Status: Impaired/Different from baseline Area of Impairment: Following commands;Orientation;Attention;Memory;Safety/judgement;Awareness;Problem solving Orientation Level: Time;Situation (difficulty describing situation) Current Attention Level: Sustained;Selective Memory: Decreased short-term memory Following Commands: Follows one step commands inconsistently Safety/Judgement: Decreased awareness of safety;Decreased awareness of deficits Awareness: Intellectual Problem Solving: Requires verbal cues;Requires tactile cues;Difficulty sequencing General Comments: follows very simple direct one step commands, needs cueing for attention prior to giving command, at times needs hand over hand cueing    Balance  Dynamic Standing Balance Dynamic Standing - Balance Support:  No upper extremity supported Dynamic Standing - Level of Assistance: 4: Min assist Dynamic Standing - Comments: stood at sink for functional activities for approx 5  minutes with increased postural sway however able to reach over head and shift weight laterally and anterior/posteriorly, mingaurd-minA for stability, cues for attention to task and task completion; mod tactile assist for anterior weight shifting with patients hands on my shoulders, pt very resistant to anterior weight shift pushing posteriorly so engaged her in functional activity with a support surface in front of her and her balance improved  End of Session PT - End of Session Equipment Utilized During Treatment: Gait belt Activity Tolerance: Patient tolerated treatment well Patient left: in bed;with call bell/phone within reach;with family/visitor present;with bed alarm set Nurse Communication: Mobility status   GP     Ludger Nutting 08/20/2012, 3:36 PM

## 2012-08-20 NOTE — PMR Pre-admission (Signed)
PMR Admission Coordinator Pre-Admission Assessment  Patient: Theresa Lynch is an 79 y.o., female MRN: [id number] DOB: Apr 26, 1929 Height: 5\' 1"  (154.9 cm) Weight: 64.9 kg (143 lb 1.3 oz)              Insurance Information HMO: yes    PPO:      PCP:      IPA:      80/20:      OTHER: medicare replacement PRIMARY: [employer] Medicare      Policy#: [id number]      Subscriber: pt CM Name: Oretha Milch      Phone#: [phone]     Fax#: [phone] on site CM  Bertram [place] [phone] Pre-Cert#: [id number]      Employer: retired Benefits:  Phone #: [phone]     Name: 7/29 Eff. 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 female with history of glaucoma, OA, who was found down in a ditch by her mailbox by a passerby on 08/15/12. She had apparently hit her head on concrete pipe in ditch and nonverbal with large hematoma and lacerations to left side of head. GCS 9-10 at admission. Work up with multiple right facial fractures and CT head revealed extensive subarachnoid hemorrhage, predominately within the suprasellar cistern and right sylvian fissure, skull base fracture along right foramen and left 2.5 cm temporo-parietal IPH (counter coup). CTA head/neck negative for dissection or aneurysms. Patient with decrease in LOC and intubated past admission. She was evaluated by Dr. Wynetta Emery and  serial CCT recommended. Dr Sheral Apley felt that facial fractures minimally displaced and no surgical intervention needed. Patient extubated without difficulty but has had agitation and confusion which is now resolved.  Past Medical History  Past Medical History  Diagnosis Date  . Glaucoma   . Depression   . Hyperlipidemia   . Arthritis   . PONV (postoperative nausea and vomiting)     Family History  family history is not on file.  Prior Rehab/Hospitalizations: none  Current Medications  Current facility-administered medications:antiseptic oral rinse (BIOTENE) solution 15 mL, 15 mL, Mouth Rinse, QID, Cherylynn Ridges, MD, 15 mL at 08/20/12 0000;  atorvastatin (LIPITOR) tablet 10 mg, 10 mg, Oral, Daily, Freeman Caldron, PA-C, 10 mg at 08/20/12 1048;  bacitracin ointment, , Topical, BID, Freeman Caldron, PA-C, [phone] application at 08/20/12 1049 feeding supplement (ENSURE) pudding 1 Container, 1 Container, Oral, TID BM, Freeman Caldron, PA-C, 1 Container at 08/20/12 1049;  fentaNYL (SUBLIMAZE) injection 25-50 mcg, 25-50 mcg, Intravenous, Q2H PRN, Cherylynn Ridges, MD;  hydrochlorothiazide (HYDRODIURIL) tablet 25 mg, 25 mg, Oral, Daily, Freeman Caldron, PA-C, 25 mg  at 08/20/12 1048;  ondansetron (ZOFRAN) injection 4 mg, 4 mg, Intravenous, Q6H PRN, Cherylynn Ridges, MD ondansetron [facility]) tablet 4 mg, 4 mg, Oral, Q6H PRN, Cherylynn Ridges, MD;  PARoxetine (PAXIL) tablet 40 mg, 40 mg, Oral, QHS, Freeman Caldron, PA-C, 40 mg at 08/19/12 2149;  timolol (TIMOPTIC) 0.5 % ophthalmic solution 1 drop, 1 drop, Both Eyes, QHS, Freeman Caldron, PA-C, 1 drop at 08/19/12 2149;  traZODone (DESYREL) tablet 50 mg, 50 mg, Oral, QHS, Freeman Caldron, PA-C, 50 mg at 08/19/12 2149 vitamin B-12 (CYANOCOBALAMIN) tablet 500 mcg, 500 mcg, Oral, Daily, Freeman Caldron, PA-C, 500 mcg at 08/20/12 1048  Patients Current Diet: Dysphagia 3 diet with thin liquids  Precautions /  Restrictions Precautions Precautions: Fall Precaution Comments: daughter reports no h/o imbalance PTA--unclear why she fell Restrictions Weight Bearing Restrictions: No   Prior Activity Level Community (5-7x/wk): independent and active  Journalist, newspaper / Equipment Home Assistive Devices/Equipment: Eyeglasses Home Equipment: Environmental consultant - 2 wheels  Prior Functional Level Prior Function Level of Independence: Independent  Current Functional Level Cognition  Overall Cognitive Status: Impaired/Different from baseline Difficult to assess due to: Impaired communication Current Attention Level: Sustained (up to 20 seconds with pericare) Orientation Level: Oriented to person Following Commands: Follows one step commands inconsistently Safety/Judgement: Decreased awareness of safety;Decreased awareness of deficits General Comments: RW got stuck on door frame on Lt x1 and Rt x1, each time pt unable to see why she could not move forward and became easily frustrated (towards agitation). ?visually could not see (despite appearing to look downward as instructed) vs decr attention     Extremity Assessment (includes Sensation/Coordination)  Upper Extremity Assessment: Generalized weakness  Lower Extremity Assessment: Defer to PT evaluation    ADLs  Eating/Feeding: Supervision/safety;Set up Where Assessed - Eating/Feeding: Edge of bed Grooming: Moderate assistance Where Assessed - Grooming: Supported standing Upper Body Bathing: Moderate assistance Where Assessed - Upper Body Bathing: Unsupported sitting Lower Body Bathing: Moderate assistance Where Assessed - Lower Body Bathing: Supported sit to stand Upper Body Dressing: Moderate assistance Where Assessed - Upper Body Dressing: Unsupported sitting Lower Body Dressing: Moderate assistance Where Assessed - Lower Body Dressing: Supported sit to Pharmacist, hospital: Moderate assistance Toilet Transfer Method: Sit to stand;Other  (comment) (ambulating) Toilet Transfer Equipment: Bedside commode Toileting - Clothing Manipulation and Hygiene: Moderate assistance Where Assessed - Toileting Clothing Manipulation and Hygiene: Sit to stand from 3-in-1 or toilet Equipment Used: Rolling walker;Gait belt Transfers/Ambulation Related to ADLs: MOd A at times due to balance deficits ADL Comments: functional decline.    Mobility  Bed Mobility: Supine to Sit;Sitting - Scoot to Edge of Bed;Sit to Supine Supine to Sit: 5: Supervision;4: Min guard;HOB flat;HOB elevated Sitting - Scoot to Edge of Bed: 4: Min guard Sit to Supine: 5: Supervision;HOB flat    Transfers  Transfers: Sit to Stand;Stand to Sit Sit to Stand: 4: Min guard;With upper extremity assist;From bed;From toilet Stand to Sit: 4: Min assist;With upper extremity assist;To bed;To toilet    Ambulation / Gait / Stairs / Wheelchair Mobility  Ambulation/Gait Ambulation/Gait Assistance: 3: Mod assist Ambulation Distance (Feet): 150 Feet Assistive device: Rolling walker Ambulation/Gait Assistance Details: Pt not attending to position with RW (herself far too far behind walker despite instructions; also running into doorframe on Rt and Lt); Unsteady with turns Gait Pattern: Step-through pattern;Decreased stride length Gait velocity: decr    Posture / Balance Static Sitting Balance Static Sitting - Balance Support: No upper extremity supported;Feet  supported Static Sitting - Level of Assistance: 5: Stand by assistance Static Standing Balance Static Standing - Balance Support: Bilateral upper extremity supported Static Standing - Level of Assistance: 4: Min assist Tandem Stance - Right Leg:  (very light HHA on Rt) [place] - Eyes Opened: 30 [place] - Eyes Closed:  (slight > normal ant-post sway) Dynamic Standing Balance Dynamic Standing - Balance Support: No upper extremity supported;During functional activity Dynamic Standing - Level of Assistance: 3: Mod  assist Dynamic Standing - Balance Activities: Other (comment) (washing hands) Dynamic Standing - Comments: posterior lean. Pt unaware of deficit High Level Balance High Level Balance Activites: Direction changes;Turns;Sudden stops High Level Balance Comments: all completed on command with no LOB    Special needs/care consideration Skin facial bruising and lacerations Bowel mgmt: incontinence Bladder mgmt: continent    Previous Home Environment Living Arrangements: Spouse/significant other;Children  Lives With: Spouse Available Help at Discharge: Family;Friend(s);Available 24 hours/day (daughter recently retired--reports they can get help as need) Type of Home: House Home Layout: One level Home Access: Stairs to enter Entrance Stairs-Rails: None Entrance Stairs-Number of Steps: 1 Bathroom Shower/Tub: Engineer, manufacturing systems: Handicapped height Bathroom Accessibility: Yes How Accessible: Accessible via walker Home Care Services: No Additional Comments: RW can be borrowed from a family member  Discharge Living Setting Plans for Discharge Living Setting: Patient's home;Lives with (comment) (spouse) Type of Home at Discharge: House Discharge Home Layout: One level Discharge Home Access: Stairs to enter Entrance Stairs-Rails: None Entrance Stairs-Number of Steps: 1 step Discharge Bathroom Shower/Tub: Tub/shower unit Discharge Bathroom Toilet: Handicapped height Discharge Bathroom Accessibility: Yes How Accessible: Accessible via walker Does the patient have any problems obtaining your medications?: No  Social/Family/Support Systems Patient Roles: Spouse;Parent Contact Information: Ethelle Lyon, spouse Anticipated Caregiver: spouse and daughter Anticipated Caregiver's Contact Information: see above Ability/Limitations of Caregiver: no limitations Caregiver Availability: 24/7 Discharge Plan Discussed with Primary Caregiver: Yes Is Caregiver In Agreement with Plan?:  Yes Does Caregiver/Family have Issues with Lodging/Transportation while Pt is in Rehab?: No Pt's spouse has early memory issues. Pt provided for him pta. There is a trip planned to [place] in October.   Goals/Additional Needs Patient/Family Goal for Rehab: Supervision to Mod I with PT, OT, and SLP Expected length of stay: ELOS 7 to 10 days Pt/Family Agrees to Admission and willing to participate: Yes Program Orientation Provided & Reviewed with Pt/Caregiver Including Roles  & Responsibilities: Yes   Decrease burden of Care through IP rehab admission: n/a  Possible need for SNF placement upon discharge:not anticipated   Patient Condition: This patient's condition remains as documented in the consult dated 08/19/12, in which the Rehabilitation Physician determined and documented that the patient's condition is appropriate for intensive rehabilitative care in an inpatient rehabilitation facility. Will admit to inpatient rehab today.  Preadmission Screen Completed By:  Clois Dupes, 08/20/2012 12:33 PM ______________________________________________________________________   Discussed status with Dr. Riley Kill on 08/20/12 at  1449 and received telephone approval for admission today.  Admission Coordinator:  Clois Dupes, time 1610 Date 08/20/12.
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
## 2012-08-20 NOTE — Procedures (Addendum)
Procedure Note  Preop Dx:  Complex deep right periorbital laceration into the periosteum  Postoperative diagnosis: The same  Procedure: Multiple layer repair of complex right periorbital laceration 4 cm long the  Description of procedure: The patient had a closed head injury and a complex deep right periorbital laceration down to the scar where she had a small heart table skull fracture. The patient already been intubated and sedated because of a closed head injury. A clean the area with saline and Betadine. It was noted that the patient had a small fracture of the outer table of the frontal skull in that area. Once it was adequately cleaned a 2 layer complex repair was performed after debriding the macerated edges of the wound. The the deep layer down to the periosteum was reapproximated using interrupted 5-0 Vicryl sutures. We subsequently placed interrupted 5-0 Prolene sutures on the skin to reapproximate it. Bleeding was controlled using electrocautery and also using suture. A sterile dressing was applied.  Theresa Lynch. Gae Bon, MD, FACS [phone] Trauma Surgeon

## 2012-08-20 NOTE — Progress Notes (Signed)
I await insurance approval to admit pt to inpt rehab today. [phone] 

## 2012-08-20 NOTE — Progress Notes (Signed)
Insurance has approved inpt rehab admit today. I have notified Trauma service. [phone]
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
## 2012-08-20 NOTE — Progress Notes (Signed)
LOS: 6 days   Subjective: Pt feels pretty good, no complaints.  Wants to know if she's been getting her Paxil medication.  Up ambulating with PT/OT, going to rehab hopefully once insurance approves her.  Only eating approximately 1/3 of all her food thus far.  Her family is helping by bringing her some outside food, but her appetite is low.  Having BM's and urinating normally.  Objective: Vital signs in last 24 hours: Temp:  [97.8 F (36.6 C)-99.6 F (37.6 C)] 99.6 F (37.6 C) (07/29 0551) Pulse Rate:  [65-77] 65 (07/29 0551) Resp:  [15-20] 15 (07/29 0551) BP: (142-178)/(64-89) 173/64 mmHg (07/29 0551) SpO2:  [94 %-100 %] 94 % (07/29 0551) Last BM Date: 08/19/12  Lab Results:  CBC  Recent Labs  08/18/12 0700  WBC 15.4*  HGB 10.3*  HCT 29.5*  PLT 333   BMET No results found for this basename: NA, K, CL, CO2, GLUCOSE, BUN, CREATININE, CALCIUM,  in the last 72 hours  Imaging: No results found.   PE: General appearance: alert, cooperative and no distress Head: Normocephalic, without obvious abnormality, ecchymosis right>left side of face Eyes: EOM's intact Resp: clear to auscultation bilaterally Cardio: regular rate and rhythm, S1, S2 normal, no murmur, click, rub or gallop GI: soft, non-tender; bowel sounds normal; no masses,  no organomegaly Extremities: extremities normal, atraumatic, no cyanosis or edema Pulses: 2+ and symmetric   Assessment/Plan: Fall and found down  TBI/SAH/R temporal ICC/basilar skull FX - TBI team  R orbit FX, R max sinus FX, frontal sinus FX - Non-operative  Facial lacs -- Local care, d/c sutures today ABL anemia - Stable  FEN - Home meds  VTE -- SCD's  Dispo - Transfer to floor, CIR when bed available and insurance approval    Candiss Norse Pager: [phone] General Trauma PA Pager: [phone]   08/20/2012

## 2012-08-21 ENCOUNTER — Inpatient Hospital Stay (HOSPITAL_COMMUNITY): Payer: Medicare Other | Admitting: Occupational Therapy

## 2012-08-21 ENCOUNTER — Inpatient Hospital Stay (HOSPITAL_COMMUNITY): Payer: Medicare Other | Admitting: Physical Therapy

## 2012-08-21 ENCOUNTER — Inpatient Hospital Stay (HOSPITAL_COMMUNITY): Payer: Medicare Other

## 2012-08-21 ENCOUNTER — Observation Stay (HOSPITAL_BASED_OUTPATIENT_CLINIC_OR_DEPARTMENT_OTHER)
Admission: AD | Admit: 2012-08-21 | Discharge: 2012-08-23 | Disposition: A | Discharge status: Rehabilitation Facility | Payer: Medicare Other | Source: Ambulatory Visit | Attending: Internal Medicine | Admitting: Internal Medicine

## 2012-08-21 ENCOUNTER — Encounter (HOSPITAL_COMMUNITY): Payer: Self-pay | Admitting: *Deleted

## 2012-08-21 ENCOUNTER — Inpatient Hospital Stay (HOSPITAL_COMMUNITY): Payer: Medicare Other | Admitting: Speech Pathology

## 2012-08-21 DIAGNOSIS — E871 Hypo-osmolality and hyponatremia: Secondary | ICD-10-CM | POA: Diagnosis present

## 2012-08-21 DIAGNOSIS — R079 Chest pain, unspecified: Secondary | ICD-10-CM | POA: Diagnosis present

## 2012-08-21 DIAGNOSIS — I10 Essential (primary) hypertension: Secondary | ICD-10-CM | POA: Insufficient documentation

## 2012-08-21 DIAGNOSIS — Z79899 Other long term (current) drug therapy: Secondary | ICD-10-CM | POA: Insufficient documentation

## 2012-08-21 DIAGNOSIS — F411 Generalized anxiety disorder: Secondary | ICD-10-CM | POA: Insufficient documentation

## 2012-08-21 DIAGNOSIS — Z87820 Personal history of traumatic brain injury: Secondary | ICD-10-CM | POA: Insufficient documentation

## 2012-08-21 DIAGNOSIS — S069XAA Unspecified intracranial injury with loss of consciousness status unknown, initial encounter: Secondary | ICD-10-CM

## 2012-08-21 DIAGNOSIS — E876 Hypokalemia: Secondary | ICD-10-CM | POA: Insufficient documentation

## 2012-08-21 DIAGNOSIS — IMO0001 Reserved for inherently not codable concepts without codable children: Secondary | ICD-10-CM | POA: Insufficient documentation

## 2012-08-21 DIAGNOSIS — E785 Hyperlipidemia, unspecified: Secondary | ICD-10-CM | POA: Insufficient documentation

## 2012-08-21 DIAGNOSIS — F3289 Other specified depressive episodes: Secondary | ICD-10-CM | POA: Insufficient documentation

## 2012-08-21 DIAGNOSIS — J9600 Acute respiratory failure, unspecified whether with hypoxia or hypercapnia: Secondary | ICD-10-CM

## 2012-08-21 DIAGNOSIS — R55 Syncope and collapse: Secondary | ICD-10-CM

## 2012-08-21 DIAGNOSIS — I609 Nontraumatic subarachnoid hemorrhage, unspecified: Secondary | ICD-10-CM

## 2012-08-21 DIAGNOSIS — F40298 Other specified phobia: Secondary | ICD-10-CM | POA: Insufficient documentation

## 2012-08-21 DIAGNOSIS — F329 Major depressive disorder, single episode, unspecified: Secondary | ICD-10-CM | POA: Insufficient documentation

## 2012-08-21 DIAGNOSIS — S069X9A Unspecified intracranial injury with loss of consciousness of unspecified duration, initial encounter: Secondary | ICD-10-CM

## 2012-08-21 DIAGNOSIS — R0789 Other chest pain: Secondary | ICD-10-CM | POA: Insufficient documentation

## 2012-08-21 DIAGNOSIS — D62 Acute posthemorrhagic anemia: Secondary | ICD-10-CM

## 2012-08-21 LAB — BASIC METABOLIC PANEL
BUN: 16 mg/dL (ref 6–23)
CO2: 28 mEq/L (ref 19–32)
CO2: 29 mEq/L (ref 19–32)
Calcium: 8.4 mg/dL (ref 8.4–10.5)
Chloride: 92 mEq/L — ABNORMAL LOW (ref 96–112)
Creatinine, Ser: 0.72 mg/dL (ref 0.50–1.10)
GFR calc Af Amer: >90 mL/min (ref >90)
GFR calc non Af Amer: 67 mL/min — ABNORMAL LOW (ref >90)
Glucose, Bld: 128 mg/dL — ABNORMAL HIGH (ref 70–99)
Glucose, Bld: 115 mg/dL — ABNORMAL HIGH (ref 70–99)
Potassium: 2 mEq/L — CL (ref 3.5–5.1)
Potassium: 2.7 mEq/L — CL (ref 3.5–5.1)
Sodium: 129 mEq/L — ABNORMAL LOW (ref 135–145)

## 2012-08-21 LAB — CBC WITH DIFFERENTIAL/PLATELET
Basophils Relative: 0 % (ref 0–1)
HCT: 32 % — ABNORMAL LOW (ref 36.0–46.0)
Hemoglobin: 11.5 g/dL — ABNORMAL LOW (ref 12.0–15.0)
Lymphocytes Relative: 18 % (ref 12–46)
Lymphs Abs: 2.4 10*3/uL (ref 0.7–4.0)
MCHC: 35.9 g/dL (ref 30.0–36.0)
Monocytes Absolute: 1.7 10*3/uL — ABNORMAL HIGH (ref 0.1–1.0)
Monocytes Relative: 13 % — ABNORMAL HIGH (ref 3–12)
Neutro Abs: 9 10*3/uL — ABNORMAL HIGH (ref 1.7–7.7)
Neutrophils Relative %: 69 % (ref 43–77)
RBC: 3.59 MIL/uL — ABNORMAL LOW (ref 3.87–5.11)

## 2012-08-21 LAB — MAGNESIUM: Magnesium: 2 mg/dL (ref 1.5–2.5)

## 2012-08-21 LAB — COMPREHENSIVE METABOLIC PANEL
Alkaline Phosphatase: 43 U/L (ref 39–117)
BUN: 15 mg/dL (ref 6–23)
CO2: 28 mEq/L (ref 19–32)
Chloride: 89 mEq/L — ABNORMAL LOW (ref 96–112)
Creatinine, Ser: 0.75 mg/dL (ref 0.50–1.10)
GFR calc Af Amer: 90 mL/min — ABNORMAL LOW (ref >90)
GFR calc non Af Amer: 77 mL/min — ABNORMAL LOW (ref >90)
Glucose, Bld: 113 mg/dL — ABNORMAL HIGH (ref 70–99)
Total Bilirubin: 0.9 mg/dL (ref 0.3–1.2)

## 2012-08-21 LAB — URINE CULTURE

## 2012-08-21 LAB — GLUCOSE, CAPILLARY: Glucose-Capillary: 134 mg/dL — ABNORMAL HIGH (ref 70–99)

## 2012-08-21 MED ORDER — POLYETHYLENE GLYCOL 3350 17 G PO PACK: 17.0000 g | PACK | Freq: Every day | ORAL | Status: DC | PRN

## 2012-08-21 MED ORDER — POTASSIUM CHLORIDE CRYS ER 20 MEQ PO TBCR: 40.0000 meq | EXTENDED_RELEASE_TABLET | Freq: Once | ORAL | Status: DC

## 2012-08-21 MED ORDER — HYDROCODONE-ACETAMINOPHEN 5-325 MG PO TABS: 1.0000 | ORAL_TABLET | ORAL | Status: DC | PRN

## 2012-08-21 MED ORDER — TIMOLOL MALEATE 0.5 % OP SOLN
1.0000 [drp] | Freq: Every day | OPHTHALMIC | Status: DC
  Administered 2012-08-22: 1 [drp] via OPHTHALMIC
  Filled 2012-08-21 (×2): qty 5

## 2012-08-21 MED ORDER — ENSURE PUDDING PO PUDG: 1.0000 | Freq: Three times a day (TID) | ORAL | Status: DC

## 2012-08-21 MED ORDER — POTASSIUM CHLORIDE CRYS ER 20 MEQ PO TBCR
40.0000 meq | EXTENDED_RELEASE_TABLET | Freq: Two times a day (BID) | ORAL | Status: AC
  Administered 2012-08-21 (×2): 40 meq via ORAL

## 2012-08-21 MED ORDER — ATORVASTATIN CALCIUM 10 MG PO TABS
10.0000 mg | ORAL_TABLET | Freq: Every day | ORAL | Status: DC
  Administered 2012-08-22 – 2012-08-23 (×2): 10 mg via ORAL
  Filled 2012-08-21 (×2): qty 1

## 2012-08-21 MED ORDER — ONDANSETRON HCL 4 MG PO TABS: 4.0000 mg | ORAL_TABLET | Freq: Four times a day (QID) | ORAL | Status: DC | PRN

## 2012-08-21 MED ORDER — ASPIRIN EC 325 MG PO TBEC: 325.0000 mg | DELAYED_RELEASE_TABLET | Freq: Every day | ORAL | Status: DC

## 2012-08-21 MED ORDER — HYDRALAZINE HCL 20 MG/ML IJ SOLN: 5.0000 mg | Freq: Four times a day (QID) | INTRAMUSCULAR | Status: DC | PRN

## 2012-08-21 MED ORDER — POTASSIUM CHLORIDE 10 MEQ/100ML IV SOLN: 10.0000 meq | INTRAVENOUS | Status: DC

## 2012-08-21 MED ORDER — ASPIRIN EC 81 MG PO TBEC
81.0000 mg | DELAYED_RELEASE_TABLET | Freq: Every day | ORAL | Status: DC
  Administered 2012-08-22 – 2012-08-23 (×2): 81 mg via ORAL
  Filled 2012-08-21 (×2): qty 1

## 2012-08-21 MED ORDER — POTASSIUM CHLORIDE 10 MEQ/100ML IV SOLN
10.0000 meq | INTRAVENOUS | Status: AC
  Administered 2012-08-21 (×2): 10 meq via INTRAVENOUS
  Filled 2012-08-21 (×2): qty 100

## 2012-08-21 MED ORDER — ONDANSETRON HCL 4 MG/2ML IJ SOLN: 4.0000 mg | Freq: Four times a day (QID) | INTRAMUSCULAR | Status: DC | PRN

## 2012-08-21 MED ORDER — POTASSIUM CHLORIDE 10 MEQ/100ML IV SOLN
10.0000 meq | INTRAVENOUS | Status: AC
  Filled 2012-08-21 (×2): qty 100

## 2012-08-21 MED ORDER — ASPIRIN EC 81 MG PO TBEC: 81.0000 mg | DELAYED_RELEASE_TABLET | Freq: Every day | ORAL | Status: DC

## 2012-08-21 MED ORDER — ACETAMINOPHEN 650 MG RE SUPP: 650.0000 mg | Freq: Four times a day (QID) | RECTAL | Status: DC | PRN

## 2012-08-21 MED ORDER — POTASSIUM CHLORIDE CRYS ER 20 MEQ PO TBCR
EXTENDED_RELEASE_TABLET | ORAL | Status: AC
  Filled 2012-08-21: qty 2

## 2012-08-21 MED ORDER — ACETAMINOPHEN 325 MG PO TABS
650.0000 mg | ORAL_TABLET | Freq: Four times a day (QID) | ORAL | Status: DC | PRN
  Administered 2012-08-22: 650 mg via ORAL
  Filled 2012-08-21: qty 2

## 2012-08-21 MED ORDER — POLYETHYLENE GLYCOL 3350 17 G PO PACK
17.0000 g | PACK | Freq: Every day | ORAL | Status: DC | PRN
  Filled 2012-08-21: qty 1

## 2012-08-21 MED ORDER — ALUM & MAG HYDROXIDE-SIMETH 200-200-20 MG/5ML PO SUSP: 30.0000 mL | Freq: Four times a day (QID) | ORAL | Status: DC | PRN

## 2012-08-21 MED ORDER — SODIUM CHLORIDE 0.9 % IJ SOLN
10.0000 mL | INTRAMUSCULAR | Status: DC | PRN
  Administered 2012-08-22: 10 mL

## 2012-08-21 MED ORDER — ENSURE PUDDING PO PUDG
1.0000 | Freq: Three times a day (TID) | ORAL | Status: DC
  Administered 2012-08-21 – 2012-08-23 (×2): 1 via ORAL

## 2012-08-21 MED ORDER — POTASSIUM CHLORIDE 10 MEQ/100ML IV SOLN
10.0000 meq | INTRAVENOUS | Status: DC
  Administered 2012-08-21: 10 meq via INTRAVENOUS
  Filled 2012-08-21 (×4): qty 100

## 2012-08-21 MED ORDER — SODIUM CHLORIDE 0.9 % IV SOLN
INTRAVENOUS | Status: DC
  Administered 2012-08-21: 18:00:00 via INTRAVENOUS
  Administered 2012-08-22: 75 mL/h via INTRAVENOUS
  Administered 2012-08-23: 01:00:00 via INTRAVENOUS

## 2012-08-21 MED ORDER — BACITRACIN ZINC 500 UNIT/GM EX OINT
TOPICAL_OINTMENT | Freq: Two times a day (BID) | CUTANEOUS | Status: DC
  Administered 2012-08-21 – 2012-08-23 (×4): via TOPICAL
  Filled 2012-08-21: qty 28.35

## 2012-08-21 MED ORDER — BACITRACIN ZINC 500 UNIT/GM EX OINT: TOPICAL_OINTMENT | Freq: Two times a day (BID) | CUTANEOUS | Status: DC

## 2012-08-21 MED ORDER — POTASSIUM CHLORIDE 10 MEQ/100ML IV SOLN
10.0000 meq | INTRAVENOUS | Status: DC
  Administered 2012-08-21: 10 meq via INTRAVENOUS
  Filled 2012-08-21 (×3): qty 100

## 2012-08-21 MED ORDER — CYANOCOBALAMIN 500 MCG PO TABS
500.0000 ug | ORAL_TABLET | Freq: Every day | ORAL | Status: DC
  Administered 2012-08-22 – 2012-08-23 (×2): 500 ug via ORAL
  Filled 2012-08-21 (×2): qty 1

## 2012-08-21 MED ORDER — POTASSIUM CHLORIDE CRYS ER 20 MEQ PO TBCR
40.0000 meq | EXTENDED_RELEASE_TABLET | Freq: Two times a day (BID) | ORAL | Status: DC
  Filled 2012-08-21 (×2): qty 2

## 2012-08-21 MED ORDER — PAROXETINE HCL 20 MG PO TABS
40.0000 mg | ORAL_TABLET | Freq: Every day | ORAL | Status: DC
  Administered 2012-08-21 – 2012-08-22 (×2): 40 mg via ORAL
  Filled 2012-08-21 (×3): qty 2

## 2012-08-21 MED ORDER — TRAZODONE HCL 50 MG PO TABS: 50.0000 mg | ORAL_TABLET | Freq: Every day | ORAL | Status: DC
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
## 2012-08-21 NOTE — Discharge Summary (Signed)
This patient has been seen and I agree with the findings and treatment plan.  Ameir Faria O. Shyheem Whitham, III, MD, FACS [phone] (pager) [phone] (direct pager) Trauma Surgeon  

## 2012-08-21 NOTE — Progress Notes (Signed)
I will follow pt's progress since readmission to acute from inpatient rehabilitation. [phone]
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
## 2012-08-21 NOTE — Progress Notes (Signed)
Doing well.  Okay for Rehab  This patient has been seen and I agree with the findings and treatment plan.  Marta Lamas. Gae Bon, MD, FACS [phone] (pager) [phone] (direct pager) Trauma Surgeon

## 2012-08-21 NOTE — Progress Notes (Signed)
Peripherally Inserted Central Catheter/Midline Placement  The IV Nurse has discussed with the patient and/or persons authorized to consent for the patient, the purpose of this procedure and the potential benefits and risks involved with this procedure.  The benefits include less needle sticks, lab draws from the catheter and patient may be discharged home with the catheter.  Risks include, but not limited to, infection, bleeding, blood clot (thrombus formation), and puncture of an artery; nerve damage and irregular heat beat.  Alternatives to this procedure were also discussed.  PICC/Midline Placement Documentation  PICC / Midline Single Lumen 08/21/12 PICC Right Basilic (Active)  Dressing Change Due 08/28/12 08/21/2012  4:00 PM       Stacie Glaze Horton 08/21/2012, 4:45 PM

## 2012-08-21 NOTE — Progress Notes (Signed)
CALLED BY PT'S NURSE TO ASSESS RIGHT PIV FOR POSSIBLE INFILTRATION, PT HAS A 20G CATHETER TO RIGHT AC WITH FLUIDS INFUSING WITHOUT DIFFICULTY AT 20ML/HR, SITE WNL'S, THERE IS A SMALL HARD RAISED AREA JUST ABOVE INSERTION SITE, LEFT ARM HAS THE SAME HARD RAISED AREA IN THE SAME PLACE, RN STATES PT WAS C/O PAIN TO SITE WITH POTASSIUM RUNNING, PT NOW DENIES ANY PAIN, AREA MARKED FOR IDENTIFICATION, SPOKE WITH STAFF RN TO CALL IF THERE ANY CHANGES

## 2012-08-21 NOTE — Progress Notes (Signed)
Patient information reviewed and entered into eRehab system by Tora Duck, RN, CRRN, PPS Coordinator.  Information including medical coding and functional independence measure will be reviewed and updated; noted that patient has discharged back to acute care this a.m.Theresa Lynch   Per nursing patient was given "Data Collection Information Summary for Patients in Inpatient Rehabilitation Facilities with attached "Privacy Act Statement-Health Care Records" upon admission.

## 2012-08-21 NOTE — Progress Notes (Signed)
Patient with syncopal episode with PT. Lab called to state panic value K+<2.0. Runs of K started while awaiting stat recheck. Pale and complaints of chest pain once IV initiated but pointing to right chest and also with complaints of abdominal pain. May need transfer to telemetry is patient unable to tolerated runs of K+ or shows signs of being unstable. Family in room and updated.

## 2012-08-21 NOTE — Progress Notes (Signed)
Subjective/Complaints: Slept fairly well. Slow to awaken this am.  A 12 point review of systems has been performed and if not noted above is otherwise negative.   Objective: Vital Signs: Blood pressure 110/71, pulse 75, temperature 99.9 F (37.7 C), temperature source Oral, resp. rate 19, height 5\' 6"  (1.676 m), weight 67.5 kg (148 lb 13 oz), SpO2 94.00%. No results found.  Recent Labs  08/21/12 0550  WBC 13.2*  HGB 11.5*  HCT 32.0*  PLT 392    Recent Labs  08/21/12 0550  NA 130*  K PENDING  CL 89*  GLUCOSE 113*  BUN 15  CREATININE 0.75  CALCIUM 8.3*   CBG (last 3)  No results found for this basename: GLUCAP,  in the last 72 hours  Wt Readings from Last 3 Encounters:  08/20/12 67.5 kg (148 lb 13 oz)  08/14/12 64.9 kg (143 lb 1.3 oz)     CBG (last 3)  No results found for this basename: GLUCAP,  in the last 72 hours  Wt Readings from Last 3 Encounters:  08/20/12 67.5 kg (148 lb 13 oz)  08/14/12 64.9 kg (143 lb 1.3 oz)    Physical Exam:  Constitutional: She appears well-developed and well-nourished.  Right facial ecchymosis with racoon eyes and battle sign/ bandage over right eye. Nasal bridge with abrasions with ecchymosis. Right forehead laceration with sutures in place.  HENT:  Head: Normocephalic.  Eyes: Pupils are equal, round, and reactive to light. Right eye exhibits discharge.  Neck: Normal range of motion. Neck supple.  Cardiovascular: Normal rate.  Pulmonary/Chest: Effort normal and breath sounds normal.  Abdominal: Soft. Bowel sounds are normal. She exhibits no distension. There is no tenderness.  Neurological: She is alert.  Very alert. Speech clear but slow with delayed processing. Mild expressive deficits.   Has receptive language and processing deficits as well.. Delayed and often Unable to follow 2 step commands. perseveration with motor (especially with LUE) and verbal apraxia noted. BLE with ataxic movements.  Skin: Skin is warm and dry.  Large bruise right lateral breast area   Assessment/Plan: 1. 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 Physiatrist is providing close team supervision and 24 hour management of active medical problems listed below. Physiatrist and rehab team continue to assess barriers to discharge/monitor patient progress toward functional and medical goals. FIM:                   Comprehension Comprehension Mode: Auditory Comprehension: 5-Follows basic conversation/direction: With no assist  Expression Expression Mode: Verbal Expression: 5-Expresses basic needs/ideas: With no assist           Medical Problem List and Plan:  1. DVT Prophylaxis/Anticoagulation: Mechanical: Sequential compression devices, below knee Bilateral lower extremities  2. Pain Management: tylenol prn for headaches.  3. H/o depression/anxiety/claustophobia: Continue paxil. Provide ego support. Has a supportive family. Will monitor mood and have LCSW follow for evaluation.  4. Neuropsych: This patient is not capable of making decisions on her own behalf.  5. Leucocytosis: Sinusitis noted on Xrays. UA negative, ucx pending. Treat if patient symptomatic.  6. ABLA: recheck in am.  Trending back up  7. Dizziness with head turns: ?vestibular component.  7. HTN: will monitor with bid checks. Continue HCTZ 9. Hyponatremia: central vs diuretic cause---recheck tomorrow  LOS (Days) 1 A FACE TO FACE EVALUATION WAS PERFORMED  Aerionna Moravek T 08/21/2012  8:21 AM

## 2012-08-21 NOTE — Significant Event (Signed)
CRITICAL VALUE ALERT  Critical value received:  Potassium 2.7  Date of notification:  08/21/12  Time of notification:  1855  Critical value read back:yes  Nurse who received alert:  Enriqueta Shutter  MD notified (1st page):  Ghimire  Time of first page:  1902  MD notified (2nd page):  Time of second page:  Responding MD:  Jerral Ralph  Time MD responded:  1903  Patient to receive 3 more runs of potassium tonight and have 40 potassium tonight and will check the labs again tomorrow.  Rubye Oaks

## 2012-08-21 NOTE — Progress Notes (Signed)
Phlebotomy technician attempted to draw afternoon labs and was unsuccessful after 3 attempts.

## 2012-08-21 NOTE — H&P (Deleted)
[facility] History and Physical  Theresa Lynch ZOX:[id number] DOB: 10/12/1929 DOA: 08/20/2012  Referring physician: Delle Reining, NP PCP: Theresa Lynch  Specialists:   Chief Complaint: Chest pain, pale, diaphoretic  HPI: Theresa Lynch is a 79 y.o. female with a history of HLD, OA, Glaucoma, depression and claustrophobia who was admitted to [facility] on 7/23 after being found down next to her mailbox by a neighbor.  In the ED she was found to have multiple right facial fractures and an extensive subarachnoid hemorrhage.  Her GCS was 9-10, and due to decreased LOC she was intubated.  Neuro surgery recommended conservative management.  At the time of extubation she was confused and found to have expressive aphasia with an unsteady gait.  She was admitted to [facility] on 08/20/12.  This morning (7/30) at approximately 8:30 am the patient was working with PT and went to the bathroom.  She apparently had a presyncopal episode, became pale, diaphoretic, and per her daughter, was lying in bed grimacing holding her fist to her chest.  Stat labs revealed a K of less that 2.0, sodium of 129,  and a WBC of 13.2.  EKG showed a junctional rhythm with poor R wave progression.  The patient appeared to normalize but then had a second episode of chest pain and diaphoresis at approximately 9:15 am.  The patient (who is a very poor historian) does not report chest pain, but rather tells me she has "[place]" pain in her right forearm. Per her Husband, Theresa Lynch, and daughter, she has never been seen by a cardiologist or had a history of cardiac problems.  Theresa Lynch was on HCTZ and potassium supplements at home.    We will readmit  Theresa Lynch to the acute care portion of the hospital in order rule out ACS, replete her electrolytes, and check a 2D echo.  Her PCP is Dr. Marisue Lynch at the [facility].  Theresa Lynch family founded the [facility].   Review of Systems: The patient  denies anorexia, fever, weight loss, vision loss, abdominal pain, changes in bowel habits, dysuria, vomiting.   Past Medical History  Diagnosis Date  . Glaucoma   . Depression     with anxiety/h/o agoraphobia  . Hyperlipidemia   . Arthritis   . PONV (postoperative nausea and vomiting)   . Claustrophobia   . HOH (hard of hearing)    Past Surgical History  Procedure Laterality Date  . Rotator cuff repair  1999    bilateral   Social History:  reports that she has never smoked. She does not have any smokeless tobacco history on file. She reports that she drinks about 4.2 ounces of alcohol per week. She reports that she does not use illicit drugs.  She lives at home with her husband Theresa Lynch.   Allergies  Allergen Reactions  . Codeine     nausea    No family history on file. Unable to collect from Ms. Prajapati  Prior to Admission medications   Medication Sig Start Date End Date Taking? Authorizing Provider  atorvastatin (LIPITOR) 10 MG tablet Take 10 mg by mouth daily.   Yes Historical Provider, MD  PARoxetine (PAXIL) 20 MG tablet Take 40 mg by mouth at bedtime.   Yes Historical Provider, MD  timolol (TIMOPTIC) 0.5 % ophthalmic solution Place 1 drop into both eyes at bedtime.   Yes Historical Provider, MD  traZODone (DESYREL) 50 MG tablet Take 50 mg by mouth at bedtime.  Yes Historical Provider, MD  vitamin B-12 (CYANOCOBALAMIN) 500 MCG tablet Take 500 mcg by mouth daily.   Yes Historical Provider, MD  bacitracin ointment Apply topically 2 (two) times daily. 08/21/12   Jacquelynn Cree, PA-C  feeding supplement (ENSURE) PUDG Take 1 Container by mouth 3 (three) times daily between meals. 08/21/12   Jacquelynn Cree, PA-C  HYDROcodone-acetaminophen (NORCO/VICODIN) 5-325 MG per tablet Take 1-2 tablets by mouth every 4 (four) hours as needed. 08/21/12   Evlyn Kanner Love, PA-C  polyethylene glycol (MIRALAX / GLYCOLAX) packet Take 17 g by mouth daily as needed. 08/21/12   Evlyn Kanner Love, PA-C  potassium  chloride 10 MEQ/100ML Inject 100 mLs (10 mEq total) into the vein every 1 hour x 4 doses. 08/21/12   Jacquelynn Cree, PA-C   Physical Exam: Filed Vitals:   08/21/12 0818 08/21/12 0825 08/21/12 0836 08/21/12 0845  BP: 110/71 114/69 120/99 120/80  Pulse:  91 71   Temp:      TempSrc:      Resp:   16   Height:      Weight:      SpO2:   96%      General:  Thin elderly female with multiple facial bruises, Awake, slow to respond, difficulty finding words.    Eyes: Pupils equal and round  ENT: No exudates or erythema  Neck: supple, no lymphadenopathy  Cardiovascular: rrr, no m/r/g, no lower ext edema, no jvd  Respiratory: cta no w/c/r  Abdomen: thin, soft, nt, nd, +BS, no masses  Skin: multiple bruises on Face, under right breast, back and hands  Musculoskeletal: able to move all 4 extremities   Psychiatric: slow to respond, but cooperative  Labs on Admission:  Basic Metabolic Panel:  Recent Labs Lab 08/15/12 0545 08/16/12 0335 08/17/12 0530 08/21/12 0550 08/21/12 0915  NA 138 138 141 130* 129*  K 3.6 3.1* 4.1 QUESTIONABLE RESULTS, RECOMMEND RECOLLECT TO VERIFY 2.0*  CL 101 105 107 89* 88*  CO2 28 26 22 28 28   GLUCOSE 115* 107* 127* 113* 128*  BUN 18 16 18 15 16   CREATININE 0.96 0.78 0.69 0.75 0.80  CALCIUM 9.1 8.6 9.2 8.3* 8.4   Liver Function Tests:  Recent Labs Lab 08/14/12 1909 08/21/12 0550  AST 26 15  ALT 14 14  ALKPHOS 45 43  BILITOT 0.8 0.9  PROT 7.9 6.7  ALBUMIN 3.2* 2.5*   CBC:  Recent Labs Lab 08/14/12 1909  08/15/12 1205 08/16/12 0335 08/17/12 0530 08/18/12 0700 08/21/12 0550  WBC 22.0*  < > 12.4* 9.3 13.2* 15.4* 13.2*  NEUTROABS 18.2*  --  9.8*  --  11.9*  --  9.0*  HGB 11.9*  < > 9.0* 8.3* 9.2* 10.3* 11.5*  HCT 34.2*  < > 26.8* 25.0* 27.2* 29.5* 32.0*  MCV 91.9  < > 93.4 94.3 93.8 91.3 89.1  PLT 281  < > 200 168 226 333 392  < > = values in this interval not displayed.   CBG:  Recent Labs Lab 08/16/12 2203 08/21/12 0834   GLUCAP 130* 134*    Radiological Exams on Admission: No results found.  EKG: Independently reviewed. Junctional rhythm  Assessment/Plan Principal Problem:   Chest pain Active Problems:   Subarachnoid hemorrhage   Closed basilar skull fracture with subarachnoid hemorrhage   Closed fracture of facial bones   TBI (traumatic brain injury)   Hypokalemia   Hyponatremia   Syncope   Presyncope with Chest Pain Will cycle troponin to  rule out ACS Seriel EKG Check Tsh Check 2D echo Check Orthostatic vital signs Hold aspirin for now due to [facility].  Hyponatremia / Hyponatremia Likely from poor intake and HTCZ without K supplementation in the hospital Replete K orally and IV, serial bmet IVF  TBI Seen by Neurosurgery will asked them to briefly review her current case (Can she have antiplatelets / DVT prophylaxis ?) Being cared for in CIR.  Will hopefully return to CIR after acute stay Will consult PT / OT  HLD Continue Statin  Anxiety/ Depression / Claustrophobia Continue Paxil  HTN Hold HCTZ for now PRN hydralazine  DVT Prophylaxis:  SCDs (will avoid pharmaceutical prophylaxis for now due to recent [facility])   Code Status: Full code - Per Daughter, her mother has a living will but she is uncertain of what the directive is.  Needs further investigation. Family Communication: Husband and daughter in the room Disposition Plan: inpatient.   Time spent: 60 min.  Conley Canal [facility] Pager [phone]  If 7PM-7AM, please contact night-coverage [email or web address] Password [facility] 08/21/2012, 11:53 AM

## 2012-08-22 DIAGNOSIS — D62 Acute posthemorrhagic anemia: Secondary | ICD-10-CM

## 2012-08-22 DIAGNOSIS — J9600 Acute respiratory failure, unspecified whether with hypoxia or hypercapnia: Secondary | ICD-10-CM

## 2012-08-22 LAB — BASIC METABOLIC PANEL
BUN: 17 mg/dL (ref 6–23)
CO2: 26 mEq/L (ref 19–32)
CO2: 27 mEq/L (ref 19–32)
Chloride: 96 mEq/L (ref 96–112)
Chloride: 98 mEq/L (ref 96–112)
Glucose, Bld: 111 mg/dL — ABNORMAL HIGH (ref 70–99)
Glucose, Bld: 141 mg/dL — ABNORMAL HIGH (ref 70–99)
Potassium: 3.1 mEq/L — ABNORMAL LOW (ref 3.5–5.1)
Potassium: 3.4 mEq/L — ABNORMAL LOW (ref 3.5–5.1)
Sodium: 132 mEq/L — ABNORMAL LOW (ref 135–145)
Sodium: 133 mEq/L — ABNORMAL LOW (ref 135–145)

## 2012-08-22 LAB — CBC
Hemoglobin: 10.3 g/dL — ABNORMAL LOW (ref 12.0–15.0)
MCH: 31.6 pg (ref 26.0–34.0)
RBC: 3.26 MIL/uL — ABNORMAL LOW (ref 3.87–5.11)
WBC: 14 10*3/uL — ABNORMAL HIGH (ref 4.0–10.5)

## 2012-08-22 LAB — TSH: TSH: 2.369 u[IU]/mL (ref 0.350–4.500)

## 2012-08-22 MED ORDER — METOPROLOL TARTRATE 12.5 MG HALF TABLET
12.5000 mg | ORAL_TABLET | Freq: Two times a day (BID) | ORAL | Status: DC
  Administered 2012-08-22 – 2012-08-23 (×3): 12.5 mg via ORAL
  Filled 2012-08-22 (×4): qty 1

## 2012-08-22 MED ORDER — POTASSIUM CHLORIDE CRYS ER 20 MEQ PO TBCR
40.0000 meq | EXTENDED_RELEASE_TABLET | Freq: Once | ORAL | Status: AC
  Administered 2012-08-22: 40 meq via ORAL
  Filled 2012-08-22: qty 1

## 2012-08-22 NOTE — Progress Notes (Signed)
Please reorder PT, SLP,  and OT when appropriate to assist with approval with [employer] to return to inpt rehab when appropriate. [phone]

## 2012-08-22 NOTE — Evaluation (Addendum)
Occupational Therapy Evaluation Patient Details Name: Theresa Lynch MRN: [id number] DOB: [birth year] Today's Date: 08/22/2012 Time: 4696-2952 OT Time Calculation (min): 20 min  OT Assessment / Plan / Recommendation History of present illness 79 year old female admitted 08/21/12 from CIR, following 08/16/12 admission following a fall/ facial lac. CT revealed extensive SAH in the suprasellar cistern and within the right sylvian fissure, as well as Left temporoparietal hemotoma.    Clinical Impression   Pt admitted with above. Will continue to follow acutely in order to address below problem list. Recommend CIR for d/c planning to further progress rehab and to maximize independence and safety before eventual return home.    OT Assessment  Patient needs continued OT Services    Follow Up Recommendations  CIR    Barriers to Discharge      Equipment Recommendations  Tub/shower bench;3 in 1 bedside comode    Recommendations for Other Services Rehab consult  Frequency  Min 2X/week    Precautions / Restrictions Precautions Precautions: Fall   Pertinent Vitals/Pain See vitals    ADL  Lower Body Dressing: Performed;+1 Total assistance Where Assessed - Lower Body Dressing: Supported sitting Toilet Transfer: Performed;+2 Total assistance Toilet Transfer: Patient Percentage: 60% Toilet Transfer Method: Stand pivot Acupuncturist: Materials engineer and Hygiene: Performed;+1 Total assistance Where Assessed - Engineer, mining and Hygiene: Standing Equipment Used: Gait belt Transfers/Ambulation Related to ADLs: +2 assist for balance and for sequencing for SPT bed<>3n1<>bed (very apraxic).    OT Diagnosis: Generalized weakness;Cognitive deficits;Disturbance of vision;Acute pain;Altered mental status  OT Problem List: Decreased strength;Decreased activity tolerance;Impaired balance (sitting and/or standing);Impaired  vision/perception;Decreased cognition;Decreased safety awareness;Decreased knowledge of use of DME or AE;Decreased knowledge of precautions OT Treatment Interventions: Self-care/ADL training;Therapeutic exercise;DME and/or AE instruction;Therapeutic activities;Cognitive remediation/compensation;Visual/perceptual remediation/compensation;Patient/family education;Balance training   OT Goals(Current goals can be found in the care plan section) Acute Rehab OT Goals Patient Stated Goal: did not state OT Goal Formulation: With patient/family Time For Goal Achievement: 09/05/12 Potential to Achieve Goals: Good  Visit Information  Last OT Received On: 08/22/12 Assistance Needed: +1 PT/OT Co-Evaluation/Treatment: Yes History of Present Illness: 79 year old female admitted 08/21/12 from CIR, following 08/16/12 admission following a fall/ facial lac. CT revealed extensive SAH in the suprasellar cistern and within the right sylvian fissure, as well as Left temporoparietal hemotoma.        Prior Functioning     Home Living Family/patient expects to be discharged to:: Inpatient rehab Living Arrangements: Spouse/significant other;Children Available Help at Discharge: Family;Friend(s);Available 24 hours/day Type of Home: House Home Access: Stairs to enter [employer] of Steps: 1 Entrance Stairs-Rails: None Home Layout: One level Home Equipment: Walker - 2 wheels Additional Comments: Pt and family hoping for return to CIR.  Lives With: Spouse Prior Function Level of Independence: Independent Dominant Hand: Right         Vision/Perception Vision - History Baseline Vision: Wears glasses only for reading Visual History: Glaucoma Patient Visual Report: Blurring of vision Vision - Assessment Vision Assessment: Vision impaired - to be further tested in functional context Praxis Praxis: Impaired Praxis Impairment Details: Ideation;Perseveration;Motor planning Praxis-Other  Comments: requires increased time and manual cueing for sequencing and initiation of tasks.   Cognition  Cognition Arousal/Alertness: Awake/alert Behavior During Therapy: Flat affect Overall Cognitive Status: Impaired/Different from baseline Area of Impairment: Attention;Following commands;Problem solving Orientation Level: Place;Time;Disoriented to Current Attention Level: Sustained Following Commands: Follows one step commands inconsistently;Follows one step commands with increased time  Problem Solving: Slow processing;Decreased initiation;Difficulty sequencing;Requires verbal cues;Requires tactile cues General Comments: Requires simple and direct one step commands.   Rancho Levels of Cognitive Functioning Ranch[place] Amigos Scales of Cognitive Functioning: Confused/appropriate    Extremity/Trunk Assessment       Mobility Bed Mobility Bed Mobility: Supine to Sit;Sitting - Scoot to Edge of Bed;Sit to Supine Supine to Sit: 3: Mod assist Sitting - Scoot to Edge of Bed: 3: Mod assist Sit to Supine: 4: Min assist Details for Bed Mobility Assistance: Verbal and tactile cueing for initation and sequencing of task.  Transfers Transfers: Stand to Sit;Sit to Stand Sit to Stand: 3: Mod assist;From bed;With upper extremity assist;From chair/3-in-1 Stand to Sit: 3: Mod assist;To bed;To chair/3-in-1;With armrests;With upper extremity assist Details for Transfer Assistance: Assist for cueing and intiation of task.  Pt with incr time to perform stand<>sit likely due to motor planning deficit.     Exercise     Balance Balance Balance Assessed: Yes Static Sitting Balance Static Sitting - Balance Support: No upper extremity supported;Feet supported Static Sitting - Level of Assistance: 4: Min assist   End of Session OT - End of Session Equipment Utilized During Treatment: Gait belt Activity Tolerance: Patient limited by fatigue Patient left: in bed;with call bell/phone within reach;with bed  alarm set Nurse Communication: Mobility status  GO Functional Assessment Tool Used: clinical judgement Functional Limitation: Self care Self Care Current Status (G9562): At least 60 percent but less than 80 percent impaired, limited or restricted Self Care Goal Status (Z3086): At least 20 percent but less than 40 percent impaired, limited or restricted  08/22/2012 Cipriano Mile OTR/L Pager [phone] Office [phone]  Cipriano Mile 08/22/2012, 4:20 PM

## 2012-08-22 NOTE — Evaluation (Signed)
Clinical/Bedside Swallow Evaluation Patient Details  Name: Theresa Lynch MRN: [id number] Date of Birth: 12/06/1929  Today's Date: 08/22/2012 Time: 1510-1530 SLP Time Calculation (min): 20 min  Past Medical History:  Past Medical History  Diagnosis Date  . Glaucoma   . Depression     with anxiety/h/o agoraphobia  . Hyperlipidemia   . Arthritis   . PONV (postoperative nausea and vomiting)   . Claustrophobia   . HOH (hard of hearing)    Past Surgical History:  Past Surgical History  Procedure Laterality Date  . Rotator cuff repair  1999    bilateral   HPI:  79 year old female admitted 08/21/12 from CIR, following 08/16/12 admission following a fall/ facial lac. CT revealed extensive SAH in the suprasellar cistern and within the right sylvian fissure, as well as Left temporoparietal hemotoma.   Assessment / Plan / Recommendation Clinical Impression  Pt has shown improvement in swallow function and safety since initial BSE.  Pt oral motor function appears WFL, and no overt s/s aspiration observed or reported.  RN reports pt tolerating diet well.  Will continue current diet (dys 3/thin) due to energy conservation, and will follow for diet tolerance and appropriateness to advance.    Aspiration Risk  Mild    Diet Recommendation Dysphagia 3 (Mechanical Soft);Thin liquid   Liquid Administration via: Straw;Cup Medication Administration: Whole meds with puree Supervision: Full supervision/cueing for compensatory strategies Compensations: Slow rate;Small sips/bites;Check for pocketing Postural Changes and/or Swallow Maneuvers: Seated upright 90 degrees;Upright 30-60 min after meal    Other  Recommendations Oral Care Recommendations: Oral care BID   Follow Up Recommendations  Inpatient Rehab    Frequency and Duration min 2x/week  2 weeks   Pertinent Vitals/Pain No pain reported.    SLP Swallow Goals Patient will consume recommended diet without observed clinical signs of  aspiration with: Supervision/safety;Moderate assistance Swallow Study Goal #1 - Progress: Progressing toward goal Patient will utilize recommended strategies during swallow to increase swallowing safety with: Supervision/safety;Moderate assistance Swallow Study Goal #2 - Progress: Progressing toward goal   Swallow Study Prior Functional Status  Cognitive/Linguistic Baseline: Within functional limits Type of Home: House  Lives With: Spouse Available Help at Discharge: Family;Friend(s);Available 24 hours/day Education: college graduate - [employer] Vocation: Retired    General Date of Onset: 08/16/12 HPI: 79 year old female admitted 08/21/12 from CIR, following 08/16/12 admission following a fall/ facial lac. CT revealed extensive SAH in the suprasellar cistern and within the right sylvian fissure, as well as Left temporoparietal hemotoma. Type of Study: Bedside swallow evaluation Previous Swallow Assessment: BSE 08/17/12 Diet Prior to this Study: Dysphagia 3 (soft);Thin liquids Temperature Spikes Noted: No Respiratory Status: Room air History of Recent Intubation: Yes Length of Intubations (days): 3 days Date extubated: 08/16/12 Behavior/Cognition: Alert;Cooperative;Pleasant mood;Hard of hearing;Requires cueing;Distractible Oral Cavity - Dentition: Adequate natural dentition Self-Feeding Abilities: Able to feed self Patient Positioning: Upright in bed Baseline Vocal Quality: Clear Volitional Cough: Weak Volitional Swallow: Able to elicit    Oral/Motor/Sensory Function Overall Oral Motor/Sensory Function: Appears within functional limits for tasks assessed   Ice Chips Ice chips: Not tested   Thin Liquid Thin Liquid: Within functional limits Presentation: Straw    Nectar Thick Nectar Thick Liquid: Not tested   Honey Thick Honey Thick Liquid: Not tested   Puree Puree: Within functional limits Presentation: Spoon;Self Fed   Solid    Functional Assessment Tool Used: clinical judgment  during bedside evaluation Functional Limitations: Swallowing Swallow Current Status [phone]): At  least 1 percent but less than 20 percent impaired, limited or restricted Swallow Goal Status (Z6109): At least 1 percent but less than 20 percent impaired, limited or restricted Attention Current Status (U0454): At least 80 percent but less than 100 percent impaired, limited or restricted Attention Goal Status (U9811): At least 20 percent but less than 40 percent impaired, limited or restricted  Solid: Within functional limits Presentation: Self Fed       Contessa Preuss B. Murvin Natal [facility], CCC-SLP [phone]  Leigh Aurora 08/22/2012,3:53 PM

## 2012-08-22 NOTE — Evaluation (Signed)
Speech Language Pathology Evaluation Patient Details Name: Theresa Lynch MRN: [id number] DOB: 11/10/1929 Today's Date: 08/22/2012 Time: 1445-1510 SLP Time Calculation (min): 25 min  Problem List:  Patient Active Problem List   Diagnosis Date Noted  . TBI (traumatic brain injury) 08/21/2012  . Hypokalemia 08/21/2012  . Hyponatremia 08/21/2012  . Syncope 08/21/2012  . Chest pain 08/21/2012  . Fall 08/18/2012  . Acute respiratory failure 08/18/2012  . Acute blood loss anemia 08/18/2012  . Intracerebral hemorrhage 08/15/2012  . Subarachnoid hemorrhage 08/15/2012  . Closed basilar skull fracture with subarachnoid hemorrhage 08/15/2012  . Closed fracture of facial bones 08/15/2012   Past Medical History:  Past Medical History  Diagnosis Date  . Glaucoma   . Depression     with anxiety/h/o agoraphobia  . Hyperlipidemia   . Arthritis   . PONV (postoperative nausea and vomiting)   . Claustrophobia   . HOH (hard of hearing)    Past Surgical History:  Past Surgical History  Procedure Laterality Date  . Rotator cuff repair  1999    bilateral   HPI:  79 year old female admitted 08/21/12 from CIR, following 08/16/12 admission following a fall/ facial lac. CT revealed extensive SAH in the suprasellar cistern and within the right sylvian fissure, as well as Left temporoparietal hemotoma.   Assessment / Plan / Recommendation Clinical Impression  Ptis now able to answer simple yes/no questions accurately, and follows most one-step directions.  She has difficulty with simple naming tasks, and is noted to be perseverative in her responses.  Administration of the MoCA was attempted.  Pt unable to complete any of the visuospatial/executive tasks, and named only 1 of 3 animals.  She exhibited recall of 4/5 unrelated words immediately, but was unable to recall them 5 minutes later, even given cues.  She repeated 5 digits forward, but was unable to repeat digits backwards, and could not figure  serial 7 subtraction.  She had difficulty repeating a sentence, and was unable to verbalize how 2 common items were alike (banana and orange, fruits) .  She was correct for the day of the week, [place], and reason for hospitalization, but could not name the city, or the month and year.  She was accurate for her birthdate, but perseverated when asked about anniversary date.  Overall MoCA score is 5/30 (n=>25/30).  Recommend continuing with speech therapy for cognitive/linguistic skills.    SLP Assessment  Patient needs continued Speech Lanaguage Pathology Services    Follow Up Recommendations  Inpatient Rehab    Frequency and Duration min 2x/week  2 weeks   Pertinent Vitals/Pain No pain reported   SLP Goals  SLP Goals Potential to Achieve Goals: Good Potential Considerations: Ability to learn/carryover information;Cooperation/participation level;Previous level of function;Family/community support Progress/Goals/Alternative treatment plan discussed with pt/caregiver and they: Agree SLP Goal #1: Pt will answer simple yes/no questions with 100% accuracy, given mod cues. SLP Goal #1 - Progress: Progressing toward goal SLP Goal #2: Pt will follow 2 step verbal directions with 80% accuracy given mod cues SLP Goal #2 - Progress: Progressing toward goal SLP Goal #3: Pt will complete automatic sequences with 80% accuracy, given mod cues SLP Goal #3 - Progress: Progressing toward goal SLP Goal #4: Pt will complete simple naming tasks with 80% accuracy, given mod cues. SLP Goal #4 - Progress: Progressing toward goal SLP Goal #5: Pt will answer simple questions with 80% accuracy, with minimal perseveration. SLP Goal #5 - Progress: Progressing toward goal  SLP Evaluation  Prior Functioning  Cognitive/Linguistic Baseline: Within functional limits Type of Home: House  Lives With: Spouse Available Help at Discharge: Family;Friend(s);Available 24 hours/day Education: college graduate -  [employer] Vocation: Retired   IT consultant  Overall Cognitive Status: Impaired/Different from baseline Arousal/Alertness: Awake/alert Orientation Level: Oriented X4;Disoriented to place;Disoriented to time;Oriented to situation Attention: Sustained Sustained Attention: Impaired Sustained Attention Impairment: Verbal basic;Functional basic Memory: Impaired Memory Impairment: Decreased short term memory;Decreased recall of new information;Storage deficit;Retrieval deficit Decreased Short Term Memory: Verbal basic;Functional basic Awareness: Impaired Problem Solving: Impaired Problem Solving Impairment: Verbal basic;Functional basic Behaviors: Perseveration Safety/Judgment: Impaired Rancho Mirant Scales of Cognitive Functioning: Confused/appropriate    Comprehension  Auditory Comprehension Overall Auditory Comprehension: Impaired Yes/No Questions: Impaired Basic Biographical Questions: 76-100% accurate Basic Immediate Environment Questions: 50-74% accurate Commands: Impaired One Step Basic Commands: 75-100% accurate Two Step Basic Commands: 50-74% accurate EffectiveTechniques: Repetition;Visual/Gestural cues;Extra processing time Visual Recognition/Discrimination Discrimination: Not tested Reading Comprehension Reading Status: Not tested    Expression Expression Primary Mode of Expression: Verbal Verbal Expression Overall Verbal Expression: Impaired Initiation: Impaired Level of Generative/Spontaneous Verbalization: Word;Phrase Repetition: Impaired Level of Impairment: Word level Naming: Impairment Responsive: 26-50% accurate Convergent: 0-24% accurate Divergent: 0-24% accurate Verbal Errors: Perseveration;Language of confusion;Not aware of errors Pragmatics: Impairment Impairments: Abnormal affect;Eye contact;Monotone Non-Verbal Means of Communication: Not applicable Written Expression Dominant Hand: Right Written Expression: Not tested   Oral / Motor Oral  Motor/Sensory Function Overall Oral Motor/Sensory Function: Appears within functional limits for tasks assessed Motor Speech Overall Motor Speech: Appears within functional limits for tasks assessed   GO Functional Assessment Tool Used: MoCA, clinical judgment Functional Limitations: Attention Attention Current Status (U9811): At least 80 percent but less than 100 percent impaired, limited or restricted Attention Goal Status (B1478): At least 20 percent but less than 40 percent impaired, limited or restricted   Celia B. Murvin [facility], CCC-SLP [phone]  [facility] 08/22/2012, 3:45 PM

## 2012-08-22 NOTE — Care Management Note (Signed)
    Page 1 of 1   08/22/2012     12:24:19 PM   CARE MANAGEMENT NOTE 08/22/2012  Patient:  Theresa Lynch, Theresa Lynch   Account Number:  [id number]  Date Initiated:  08/22/2012  Documentation initiated by:  GRAVES-BIGELOW,Sandar Krinke  Subjective/Objective Assessment:   Pt admitted with cp from CIR. Low potassium- repleated IV. Plan to return back to CIR when medically stable.     Action/Plan:   No further needs from CM at this time.   Anticipated DC Date:  08/23/2012   Anticipated DC Plan:  IP REHAB FACILITY      DC Planning Services  CM consult      PAC Choice  IP REHAB   Choice offered to / List presented to:             Status of service:  Completed, signed off Medicare Important Message given?   (If response is "NO", the following Medicare IM given date fields will be blank) Date Medicare IM given:   Date Additional Medicare IM given:    Discharge Disposition:  IP REHAB FACILITY  Per UR Regulation:  Reviewed for med. necessity/level of care/duration of stay  If discussed at Long Length of Stay Meetings, dates discussed:    Comments:

## 2012-08-22 NOTE — Progress Notes (Signed)
Echo Lab  2D Echocardiogram completed.  Theresa Lynch, RDCS 08/22/2012 11:11 AM

## 2012-08-22 NOTE — Progress Notes (Signed)
[facility] notified that Pt has converted from Junctional to NSR via text page strip is in the chart. Will continue to monitor. Ilean Skill LPN

## 2012-08-22 NOTE — Progress Notes (Signed)
Patient ID: Theresa Lynch  female  WUJ:[id number]    DOB: 1929/07/27    DOA: 08/21/2012  PCP: Marisue Ivan  Assessment/Plan: Principal Problem:    Chest pain - Chest pain resolved, cardiac enzymes negative, 2-D echo showed EF of 70-75%, no regional wall motion abnormalities, grade 3 diastolic dysfunction  Active Problems:   Hypokalemia: Likely secondary to HCTZ without any potassium supplementation - Replaced, will likely need daily supplementation    Hyponatremia - Improving    Syncope - Likely secondary to hypokalemia, 2-D echo showed no regional wall motion abnormalities, grade 3 diastolic dysfunction - UA negative for UTI - Chest x-ray yesterday showed no focal consolidation  DVT Prophylaxis:  Code Status:  Disposition: Have ordered PT, OT, and slp EVAL, will dc to in-patient rehab in AM     Subjective: Patient seen and examined, denies any chest pain, nausea vomiting, comfortable  Objective: Weight change:   Intake/Output Summary (Last 24 hours) at 08/22/12 1442 Last data filed at 08/22/12 0700  Gross per 24 hour  Intake 943.75 ml  Output      0 ml  Net 943.75 ml   Blood pressure 156/84, pulse 82, temperature 98.2 F (36.8 C), temperature source Oral, resp. rate 18, SpO2 94.00%.  Physical Exam: General: Alert and awake,  not in any acute distress, multiple facial bruises. CVS: S1-S2 clear, no murmur rubs or gallops Chest: clear to auscultation bilaterally, no wheezing, rales or rhonchi Abdomen: soft nontender, nondistended, normal bowel sounds  Extremities: no cyanosis, clubbing or edema noted bilaterally Neuro: Cranial nerves II-XII intact, no focal neurological deficits  Lab Results: Basic Metabolic Panel:  Recent Labs Lab 08/21/12 1802 08/22/12 0015 08/22/12 0521  NA 132* 132* 133*  K 2.7* 3.4* 3.1*  CL 92* 96 98  CO2 29 27 26   GLUCOSE 115* 141* 111*  BUN 16 17 15   CREATININE 0.72 0.73 0.72  CALCIUM 8.3* 8.2* 8.0*  MG 2.0  --   --     Liver Function Tests:  Recent Labs Lab 08/21/12 0550  AST 15  ALT 14  ALKPHOS 43  BILITOT 0.9  PROT 6.7  ALBUMIN 2.5*   No results found for this basename: LIPASE, AMYLASE,  in the last 168 hours No results found for this basename: AMMONIA,  in the last 168 hours CBC:  Recent Labs Lab 08/21/12 0550 08/22/12 0521  WBC 13.2* 14.0*  NEUTROABS 9.0*  --   HGB 11.5* 10.3*  HCT 32.0* 29.4*  MCV 89.1 90.2  PLT 392 419*   Cardiac Enzymes:  Recent Labs Lab 08/21/12 1802 08/22/12 0015  TROPONINI <0.30 <0.30   BNP: No components found with this basename: POCBNP,  CBG:  Recent Labs Lab 08/16/12 2203 08/21/12 0834  GLUCAP 130* 134*     Micro Results: Recent Results (from the past 240 hour(s))  MRSA PCR SCREENING     Status: None   Collection Time    08/15/12  4:31 AM      Result Value Range Status   MRSA by PCR NEGATIVE  NEGATIVE Final   Comment:            The GeneXpert MRSA Assay (FDA     approved for NASAL specimens     only), is one component of a     comprehensive MRSA colonization     surveillance program. It is not     intended to diagnose MRSA     infection nor to guide or     monitor treatment  for     MRSA infections.  URINE CULTURE     Status: None   Collection Time    08/20/12 11:05 PM      Result Value Range Status   Specimen Description URINE, CLEAN CATCH   Final   Special Requests NONE   Final   Culture  Setup Time 08/21/2012 00:39   Final   Colony Count 20,OOO COLONIES/ML   Final   Culture     Final   Value: Multiple bacterial morphotypes present, none predominant. Suggest appropriate recollection if clinically indicated.   Report Status 08/21/2012 FINAL   Final    Studies/Results: Ct Angio Head W/cm &/or Wo Cm  08/14/2012   *RADIOLOGY REPORT*  Clinical Data:  Fall.  Subarachnoid hemorrhage.  Intraparenchymal hemorrhage.  Assess for aneurysmal cause.  CT ANGIOGRAPHY HEAD AND NECK  Technique:  Multidetector CT imaging of the head and  neck was performed using the standard protocol during bolus administration of intravenous contrast.  Multiplanar CT image reconstructions including MIPs were obtained to evaluate the vascular anatomy. Carotid stenosis measurements (when applicable) are obtained utilizing NASCET criteria, using the distal internal carotid diameter as the denominator.  Contrast: 50mL OMNIPAQUE IOHEXOL 350 MG/ML SOLN  Comparison:  Head CT same day  CTA NECK  Findings:  There is mild dependent pulmonary atelectasis.  There is scarring at the lung apices.  The branching pattern of the brachiocephalic vessels from the arch is normal.  No origin stenosis.  Both common carotid arteries are widely patent to the bifurcation regions.  On the right, there is calcific plaque at the bifurcation but there is no stenosis of the ICA.  On the left, there is calcific plaque at the bifurcation but again no measurable stenosis of the ICA compared to the more distal cervical ICA diameter.  Both vertebral artery origins are widely patent.  Both vertebral arteries are widely patent through the cervical region with the right being slightly larger than the left.   Review of the MIP images confirms the above findings.  IMPRESSION: Some atherosclerosis at the carotid bifurcations bilaterally but no measurable stenosis on either side.  CTA HEAD  Findings:  Both internal carotid arteries are widely patent through the siphon regions.  There is mild atherosclerotic irregularity but no stenosis.  The anterior and middle cerebral vessels are patent without proximal stenosis, aneurysm or vascular malformation.  Both vertebral arteries are patent to the basilar.  No basilar stenosis.  Posterior circulation branch vessels appear intact.  As seen at parenchymal imaging, there are skull base fractures, subarachnoid hemorrhage and indent parenchymal hematoma in the left temporo-parietal junction region.  The hematoma is no larger, again measuring 3 cm in maximal diameter.    Review of the MIP images confirms the above findings.  IMPRESSION: No evidence of intracranial aneurysm or vascular malformation.  Mild atherosclerotic irregularity without significant stenosis or major vessel occlusion.   Original Report Authenticated By: Paulina Fusi, M.D.   Dg Chest 2 View  08/17/2012   *RADIOLOGY REPORT*  Clinical Data: Cough status post fall.  CHEST - 2 VIEW  Comparison: 08/16/2012 and 08/15/2012.  Findings: Interval extubation with removal of the nasogastric tube. There are worsening left greater than right basilar air space opacities with associated small bilateral pleural effusions.  The pulmonary vasculature appears somewhat indistinct.  Heart size and mediastinal contours are stable.  There is no evidence of pneumothorax.  The subacromial space of both shoulders is narrowed consistent with chronic rotator cuff tears.  Postsurgical changes are present within the right humeral head. There are degenerative changes in the upper lumbar spine.  No acute osseous findings are seen.  IMPRESSION: Worsening left greater than right lower lobe air space disease and pleural effusions worrisome for pneumonia.  Possible mild superimposed edema.   Original Report Authenticated By: Carey Bullocks, M.D.   Ct Head Wo Contrast  08/21/2012   *RADIOLOGY REPORT*  Clinical Data: Head trauma with the previous subarachnoid hemorrhage.  Follow-up.  CT HEAD WITHOUT CONTRAST  Technique:  Contiguous axial images were obtained from the base of the skull through the vertex without contrast.  Comparison: 08/17/2012  Findings: Hemorrhagic contusions at the right temporal tip, left occipital lobe, and left temporo-parietal junction region are again seen, without evidence of increased bleeding.  Right temporal tip contusion measures 12 mm.  Left occipital contusion measures 9 mm. Left temporo-parietal contusion measures 2.7 cm.  Mild surrounding vasogenic edema without significant mass effect or shift. Subarachnoid  hemorrhage previously seen is no longer visible.  No subdural hematoma.  Motion prohibits accurate evaluation of the previously seen facial fractures, but no change is suspected.  IMPRESSION: No new or increased intracranial hemorrhage.  Hemorrhagic contusions of the right temporal tip, left occipital lobe and left temporal parietal junction are not changed.  Resolution of previously seen subarachnoid blood.   Original Report Authenticated By: Paulina Fusi, M.D.   Ct Head Wo Contrast  08/17/2012   **ADDENDUM** CREATED: 08/17/2012 14:56:31  Additional Conclusions: 3. Extensive paranasal sinus disease redemonstrated with multiple air fluid levels, compatible with sinusitis. 4.  New left mastoid effusion.  **END ADDENDUM** SIGNED BY: Florencia Reasons, M.D.  08/17/2012   *RADIOLOGY REPORT*  Clinical Data: Follow-up evaluation for subarachnoid hemorrhage.  CT HEAD WITHOUT CONTRAST  Technique:  Contiguous axial images were obtained from the base of the skull through the vertex without contrast.  Comparison: 08/15/2012.  Findings: In the left temporoparietal region there is again a 3.0 x 2.2 cm high attenuation region compatible with intraparenchymal hemorrhage.  A small focus of hemorrhage in the medial aspect of the left occipital lobe is similar in size measuring 8 x 13 mm. Around both of these areas there is a some low attenuation, compatible with adjacent edema.  This results in some mild local mass effect, however, there is no frank midline shift at this time. Basal cisterns remain patent.  There is a small amount of extra- axial blood adjacent this overlying the posterior left cerebral cortex, however, this is less apparent in the prior study, most compatible with resolving subarachnoid hemorrhage.  The amount of subarachnoid hemorrhage overlying the right frontotemporal region also appears decreased compared to the prior study.  No new sites of intraparenchymal or extra-axial blood are noted.  No  hydrocephalous. A left mastoid effusion is new compared to the prior study.  Right mastoid is well pneumatized.  There is multi focal mucosal thickening throughout the paranasal sinuses bilaterally, there is a large air-fluid level within the right maxillary sinus.  IMPRESSION: 1.  Slight decrease in volume of subarachnoid hemorrhage, as above. 2.  Intraparenchymal hemorrhages in the left temporoparietal region and medial left occipital lobe appear similar to the prior study, surrounded by some mild edema in both locations.   Original Report Authenticated By: Trudie Reed, M.D.   Ct Head Wo Contrast  08/15/2012   *RADIOLOGY REPORT*  Clinical Data: Fall.  Followup intracranial hemorrhage.  No new neurological changes.  CT HEAD WITHOUT CONTRAST  Technique:  Contiguous axial  images were obtained from the base of the skull through the vertex without contrast.  Comparison: 08/14/2012.  Findings: Left posterior temporal/periatrial hematoma has increased in size now measuring 3 x 2.3 cm versus prior 2.6 x 2 cm.  Mild amount of surrounding vasogenic edema and local mass effect upon the atrium or left lateral ventricle.  Increase in size of medial left occipital lobe hematoma now measuring 1.3 x 0.9 cm versus prior 0.4 x 0.5 cm.  Increase in amount of subarachnoid blood versus shifting of subarachnoid blood now seen more superiorly in the posterior frontal - parietal region.  Prominent cast like subarachnoid blood/subdural blood anterior interhemispheric fissure with subarachnoid blood extending along the course of the right middle cerebral artery similar to the prior exam.  Tiny amount of dependent interventricular blood now noted.  Skull fractures.  Paranasal sinus opacification.  Hematoma subcutaneous region and right periorbital region and contains radiopaque material.  Small amount of gas within the right orbit has shifted position. Orbital wall injury suspected.  IMPRESSION:  Left posterior temporal/periatrial  hematoma has increased in size now measuring 3 x 2.3 cm versus prior 2.6 x 2 cm.  Mild amount of surrounding vasogenic edema and local mass effect upon the atrium or left lateral ventricle.  Increase in size of medial left occipital lobe hematoma now measuring 1.3 x 0.9 cm versus prior 0.4 x 0.5 cm.  Increase in amount of subarachnoid blood versus shifting of subarachnoid blood now seen more superiorly in the posterior frontal - parietal region.  Prominent cast like subarachnoid blood/subdural blood anterior interhemispheric fissure with subarachnoid blood extending along the course of the right middle cerebral artery similar to the prior exam.  Tiny amount of dependent interventricular blood now noted.  This has been made a PRA call report utilizing dashboard call feature.   Original Report Authenticated By: Lacy Duverney, M.D.   Ct Head Wo Contrast  08/14/2012   *RADIOLOGY REPORT*  Clinical Data:  Fall with laceration.  The patient has been unresponsive.  CT HEAD WITHOUT CONTRAST CT MAXILLOFACIAL WITHOUT CONTRAST CT CERVICAL SPINE WITHOUT CONTRAST  Technique:  Multidetector CT imaging of the head, cervical spine, and maxillofacial structures were performed using the standard protocol without intravenous contrast. Multiplanar CT image reconstructions of the cervical spine and maxillofacial structures were also generated.  Comparison:   None  CT HEAD  Findings: Extensive subarachnoid hemorrhage is present within the suprasellar cistern and within the right sylvian fissure.  There is blood along the right side the falx anteriorly and along the left tentorium.  A focal area of parenchymal hemorrhage is evident in the left temporal parietal lobe with subarachnoid extension.  This area likely represents a counter coup injury associated with the front right periorbital hematoma and laceration.  A minimally-displaced fracture is suspected along the physis right skull base adjacent to the foraminal sac is.  Please see  the report below for additional facial fractures.  IMPRESSION:  1.  Extensive subarachnoid hemorrhage, predominately within the suprasellar cistern and right sylvian fissure.  This may be post- traumatic as the patient's fractures are on the right.  Aneurysmal hemorrhage is not excluded. 2.  The skull base fracture along the right foramen also.  This crosses the right carotid artery. 3.  Right temporal parietal parenchymal hemorrhage represents a 2.5 cm counter coup injury.  There is adjacent subarachnoid hemorrhage.  CT MAXILLOFACIAL  Findings:  A right orbital floor fracture is displaced with extensive extraconal gas about the right orbit.  There  is no entrapment of the musculature.  A nondisplaced fracture is present in the posterior wall of the right maxillary sinus.  Fluid levels in the sphenoid sinus measures somewhat high density and likely correspond to occult fractures.  There is a fracture along the right side the clivus which potentially crosses the foramen lacerum.  The mastoid air cells are clear.  Mild circumferential mucosal thickening is present in the left maxillary sinus.  The anterior ethmoid air cells and frontal sinuses demonstrate mucosal thickening.  There is a fluid level in the right frontal sinus.  The posterior wall fractures suspected with a tiny locule of pneumocephalus.  A large right periorbital hematoma and laceration is present. Radiopaque densities are evident within the laceration.  The globe is intact.  Extensive subarachnoid hemorrhage is again noted.  IMPRESSION:  1.  Skull base fractures on the right extending through the right side the clivus and foramen lacerum.  Occult fracture is suspected through the right sphenoid sinus. 2.  CTA of the head and neck would be useful for evaluation of carotid artery injury or aneurysm. 3.  Right orbital floor fracture without entrapment of the musculature. 4.  Posterior wall right maxillary sinus fracture. 5.  Nondisplaced fracture through  the posterior right frontal sinus with a tiny locule of pneumocephalus. 6.  Large right periorbital hematoma and laceration.  Radiopaque debris is present within the laceration.  CT CERVICAL SPINE  Findings:   Despite imaging twice, the cervical spine images are significantly degraded by patient motion.  Slight degenerative anterolisthesis is present at C3-4.  There is significant loss of disc height at C4-5 and C5-6.  The cervical spine is imaged through T3-4.  No acute fracture or traumatic subluxation is evident. Multilevel facet degenerative changes are worse on the left.  The lung apices demonstrate mild biapical pleural parenchymal scarring.  IMPRESSION:  1.  Moderate spondylosis of the cervical spine without definite fracture. 2.  The images are degraded by patient motion.  Critical Value/emergent results were called by telephone at the time of interpretation on 08/14/2012 at 07:10 p.m. to Dr. Jeraldine Loots, who verbally acknowledged these results.   Original Report Authenticated By: Marin Roberts, M.D.   Ct Angio Neck W/cm &/or Wo/cm  08/14/2012   *RADIOLOGY REPORT*  Clinical Data:  Fall.  Subarachnoid hemorrhage.  Intraparenchymal hemorrhage.  Assess for aneurysmal cause.  CT ANGIOGRAPHY HEAD AND NECK  Technique:  Multidetector CT imaging of the head and neck was performed using the standard protocol during bolus administration of intravenous contrast.  Multiplanar CT image reconstructions including MIPs were obtained to evaluate the vascular anatomy. Carotid stenosis measurements (when applicable) are obtained utilizing NASCET criteria, using the distal internal carotid diameter as the denominator.  Contrast: 50mL OMNIPAQUE IOHEXOL 350 MG/ML SOLN  Comparison:  Head CT same day  CTA NECK  Findings:  There is mild dependent pulmonary atelectasis.  There is scarring at the lung apices.  The branching pattern of the brachiocephalic vessels from the arch is normal.  No origin stenosis.  Both common carotid  arteries are widely patent to the bifurcation regions.  On the right, there is calcific plaque at the bifurcation but there is no stenosis of the ICA.  On the left, there is calcific plaque at the bifurcation but again no measurable stenosis of the ICA compared to the more distal cervical ICA diameter.  Both vertebral artery origins are widely patent.  Both vertebral arteries are widely patent through the cervical region with the right  being slightly larger than the left.   Review of the MIP images confirms the above findings.  IMPRESSION: Some atherosclerosis at the carotid bifurcations bilaterally but no measurable stenosis on either side.  CTA HEAD  Findings:  Both internal carotid arteries are widely patent through the siphon regions.  There is mild atherosclerotic irregularity but no stenosis.  The anterior and middle cerebral vessels are patent without proximal stenosis, aneurysm or vascular malformation.  Both vertebral arteries are patent to the basilar.  No basilar stenosis.  Posterior circulation branch vessels appear intact.  As seen at parenchymal imaging, there are skull base fractures, subarachnoid hemorrhage and indent parenchymal hematoma in the left temporo-parietal junction region.  The hematoma is no larger, again measuring 3 cm in maximal diameter.   Review of the MIP images confirms the above findings.  IMPRESSION: No evidence of intracranial aneurysm or vascular malformation.  Mild atherosclerotic irregularity without significant stenosis or major vessel occlusion.   Original Report Authenticated By: Paulina Fusi, M.D.   Ct Cervical Spine Wo Contrast  08/14/2012   *RADIOLOGY REPORT*  Clinical Data:  Fall with laceration.  The patient has been unresponsive.  CT HEAD WITHOUT CONTRAST CT MAXILLOFACIAL WITHOUT CONTRAST CT CERVICAL SPINE WITHOUT CONTRAST  Technique:  Multidetector CT imaging of the head, cervical spine, and maxillofacial structures were performed using the standard protocol  without intravenous contrast. Multiplanar CT image reconstructions of the cervical spine and maxillofacial structures were also generated.  Comparison:   None  CT HEAD  Findings: Extensive subarachnoid hemorrhage is present within the suprasellar cistern and within the right sylvian fissure.  There is blood along the right side the falx anteriorly and along the left tentorium.  A focal area of parenchymal hemorrhage is evident in the left temporal parietal lobe with subarachnoid extension.  This area likely represents a counter coup injury associated with the front right periorbital hematoma and laceration.  A minimally-displaced fracture is suspected along the physis right skull base adjacent to the foraminal sac is.  Please see the report below for additional facial fractures.  IMPRESSION:  1.  Extensive subarachnoid hemorrhage, predominately within the suprasellar cistern and right sylvian fissure.  This may be post- traumatic as the patient's fractures are on the right.  Aneurysmal hemorrhage is not excluded. 2.  The skull base fracture along the right foramen also.  This crosses the right carotid artery. 3.  Right temporal parietal parenchymal hemorrhage represents a 2.5 cm counter coup injury.  There is adjacent subarachnoid hemorrhage.  CT MAXILLOFACIAL  Findings:  A right orbital floor fracture is displaced with extensive extraconal gas about the right orbit.  There is no entrapment of the musculature.  A nondisplaced fracture is present in the posterior wall of the right maxillary sinus.  Fluid levels in the sphenoid sinus measures somewhat high density and likely correspond to occult fractures.  There is a fracture along the right side the clivus which potentially crosses the foramen lacerum.  The mastoid air cells are clear.  Mild circumferential mucosal thickening is present in the left maxillary sinus.  The anterior ethmoid air cells and frontal sinuses demonstrate mucosal thickening.  There is a fluid  level in the right frontal sinus.  The posterior wall fractures suspected with a tiny locule of pneumocephalus.  A large right periorbital hematoma and laceration is present. Radiopaque densities are evident within the laceration.  The globe is intact.  Extensive subarachnoid hemorrhage is again noted.  IMPRESSION:  1.  Skull base fractures  on the right extending through the right side the clivus and foramen lacerum.  Occult fracture is suspected through the right sphenoid sinus. 2.  CTA of the head and neck would be useful for evaluation of carotid artery injury or aneurysm. 3.  Right orbital floor fracture without entrapment of the musculature. 4.  Posterior wall right maxillary sinus fracture. 5.  Nondisplaced fracture through the posterior right frontal sinus with a tiny locule of pneumocephalus. 6.  Large right periorbital hematoma and laceration.  Radiopaque debris is present within the laceration.  CT CERVICAL SPINE  Findings:   Despite imaging twice, the cervical spine images are significantly degraded by patient motion.  Slight degenerative anterolisthesis is present at C3-4.  There is significant loss of disc height at C4-5 and C5-6.  The cervical spine is imaged through T3-4.  No acute fracture or traumatic subluxation is evident. Multilevel facet degenerative changes are worse on the left.  The lung apices demonstrate mild biapical pleural parenchymal scarring.  IMPRESSION:  1.  Moderate spondylosis of the cervical spine without definite fracture. 2.  The images are degraded by patient motion.  Critical Value/emergent results were called by telephone at the time of interpretation on 08/14/2012 at 07:10 p.m. to Dr. Jeraldine Loots, who verbally acknowledged these results.   Original Report Authenticated By: Marin Roberts, M.D.   Dg Pelvis Portable  08/14/2012   *RADIOLOGY REPORT*  Clinical Data: Status post fall  PORTABLE PELVIS  Comparison: None.  Findings: There is no acute fracture or dislocation.   The soft tissues are normal.  IMPRESSION: No acute fracture or dislocation.   Original Report Authenticated By: Sherian Rein, M.D.   Dg Chest Port 1 View  08/21/2012   *RADIOLOGY REPORT*  Clinical Data: Status post PICC line placement for IV access.  PORTABLE CHEST - 1 VIEW  Comparison: 08/17/2012  Findings: Right upper extremity PICC line is in place.  Catheter tip projects at the level of the mid SVC.  Lungs show bibasilar atelectasis with potential component of small bilateral pleural effusions.  No overt pulmonary edema or focal airspace consolidation is seen.  Heart size is normal.  IMPRESSION: PICC line tip lies in the mid SVC.   Original Report Authenticated By: Irish Lack, M.D.   Dg Chest Port 1 View  08/16/2012   *RADIOLOGY REPORT*  Clinical Data: Check endotracheal tube.  PORTABLE CHEST - 1 VIEW  Comparison: 08/15/2012  Findings: Endotracheal and enteric tubes appear unchanged in position.  Normal heart size and pulmonary vascularity.  Shallow inspiration.  There is developing infiltration or atelectasis in the left lung base.  Possible small left pleural effusion.  IMPRESSION: Appliances appear stable in location.  Developing infiltration or atelectasis in the left lung base.   Original Report Authenticated By: Burman Nieves, M.D.   Dg Chest Port 1 View  08/15/2012   *RADIOLOGY REPORT*  Clinical Data: Respiratory difficulty  PORTABLE CHEST - 1 VIEW  Comparison: 08/14/2012  Findings: Cardiac shadow is stable.  Endotracheal tube and nasogastric catheter are now seen. The proximal side hole of the nasogastric catheter lies within the distal stomach.  The lungs are clear bilaterally.  IMPRESSION: No acute abnormality noted.   Original Report Authenticated By: Alcide Clever, M.D.   Dg Chest Portable 1 View  08/14/2012   *RADIOLOGY REPORT*  Clinical Data: Status post fall with endotracheal tube placement  PORTABLE CHEST - 1 VIEW  Comparison: None.  Findings: Endotracheal tube is seen in good  position.  There is no pneumothorax.  There is no focal infiltrate, pulmonary edema, or pleural effusion.  Mediastinal contour and cardiac silhouette are normal.  There is no pneumothorax.  No acute abnormality is identified the visualized bones.  IMPRESSION: Endotracheal tube in good position.  There is no pneumothorax.  The lungs are clear.   Original Report Authenticated By: Sherian Rein, M.D.   Dg Cerv Spine Flex&ext Only  08/17/2012   *RADIOLOGY REPORT*  Clinical Data: History of fall.  CERVICAL SPINE - FLEXION AND EXTENSION VIEWS ONLY  Comparison: No priors.  Findings: Lateral, flexion lateral and extension lateral views of the cervical spine demonstrate no definite acute displaced fractures.  No pathologic motion on the flexion or extension lateral views.  Mild reversal of normal cervical lordosis centered at the level of C4-C5, favored to be chronic and related to multilevel degenerative disc disease and cervical spondylosis. Alignment is otherwise anatomic.  Multilevel degenerative disc disease is noted, most severe at C3-C4, C4-C5, C5-C6 and C6-C7. Multilevel facet arthropathy is also noted.  IMPRESSION: 1.  No acute abnormality of the cervical spine. 2.  Multilevel degenerative disc disease and cervical spondylosis, as above.   Original Report Authenticated By: Trudie Reed, M.D.   Ct Maxillofacial Wo Cm  08/14/2012   *RADIOLOGY REPORT*  Clinical Data:  Fall with laceration.  The patient has been unresponsive.  CT HEAD WITHOUT CONTRAST CT MAXILLOFACIAL WITHOUT CONTRAST CT CERVICAL SPINE WITHOUT CONTRAST  Technique:  Multidetector CT imaging of the head, cervical spine, and maxillofacial structures were performed using the standard protocol without intravenous contrast. Multiplanar CT image reconstructions of the cervical spine and maxillofacial structures were also generated.  Comparison:   None  CT HEAD  Findings: Extensive subarachnoid hemorrhage is present within the suprasellar cistern and  within the right sylvian fissure.  There is blood along the right side the falx anteriorly and along the left tentorium.  A focal area of parenchymal hemorrhage is evident in the left temporal parietal lobe with subarachnoid extension.  This area likely represents a counter coup injury associated with the front right periorbital hematoma and laceration.  A minimally-displaced fracture is suspected along the physis right skull base adjacent to the foraminal sac is.  Please see the report below for additional facial fractures.  IMPRESSION:  1.  Extensive subarachnoid hemorrhage, predominately within the suprasellar cistern and right sylvian fissure.  This may be post- traumatic as the patient's fractures are on the right.  Aneurysmal hemorrhage is not excluded. 2.  The skull base fracture along the right foramen also.  This crosses the right carotid artery. 3.  Right temporal parietal parenchymal hemorrhage represents a 2.5 cm counter coup injury.  There is adjacent subarachnoid hemorrhage.  CT MAXILLOFACIAL  Findings:  A right orbital floor fracture is displaced with extensive extraconal gas about the right orbit.  There is no entrapment of the musculature.  A nondisplaced fracture is present in the posterior wall of the right maxillary sinus.  Fluid levels in the sphenoid sinus measures somewhat high density and likely correspond to occult fractures.  There is a fracture along the right side the clivus which potentially crosses the foramen lacerum.  The mastoid air cells are clear.  Mild circumferential mucosal thickening is present in the left maxillary sinus.  The anterior ethmoid air cells and frontal sinuses demonstrate mucosal thickening.  There is a fluid level in the right frontal sinus.  The posterior wall fractures suspected with a tiny locule of pneumocephalus.  A large right periorbital hematoma and laceration  is present. Radiopaque densities are evident within the laceration.  The globe is intact.   Extensive subarachnoid hemorrhage is again noted.  IMPRESSION:  1.  Skull base fractures on the right extending through the right side the clivus and foramen lacerum.  Occult fracture is suspected through the right sphenoid sinus. 2.  CTA of the head and neck would be useful for evaluation of carotid artery injury or aneurysm. 3.  Right orbital floor fracture without entrapment of the musculature. 4.  Posterior wall right maxillary sinus fracture. 5.  Nondisplaced fracture through the posterior right frontal sinus with a tiny locule of pneumocephalus. 6.  Large right periorbital hematoma and laceration.  Radiopaque debris is present within the laceration.  CT CERVICAL SPINE  Findings:   Despite imaging twice, the cervical spine images are significantly degraded by patient motion.  Slight degenerative anterolisthesis is present at C3-4.  There is significant loss of disc height at C4-5 and C5-6.  The cervical spine is imaged through T3-4.  No acute fracture or traumatic subluxation is evident. Multilevel facet degenerative changes are worse on the left.  The lung apices demonstrate mild biapical pleural parenchymal scarring.  IMPRESSION:  1.  Moderate spondylosis of the cervical spine without definite fracture. 2.  The images are degraded by patient motion.  Critical Value/emergent results were called by telephone at the time of interpretation on 08/14/2012 at 07:10 p.m. to Dr. Jeraldine Loots, who verbally acknowledged these results.   Original Report Authenticated By: Marin Roberts, M.D.    Medications: Scheduled Meds: . aspirin EC  81 mg Oral Daily  . atorvastatin  10 mg Oral Daily  . bacitracin   Topical BID  . cyanocobalamin  500 mcg Oral Daily  . feeding supplement  1 Container Oral TID BM  . metoprolol tartrate  12.5 mg Oral BID  . PARoxetine  40 mg Oral QHS  . timolol  1 drop Both Eyes QHS      LOS: 1 day   RAI,RIPUDEEP M.D. [facility] 08/22/2012, 2:42 PM Pager: [phone]  If  7PM-7AM, please contact night-coverage [email or web address] Password [email or web address]

## 2012-08-22 NOTE — Evaluation (Signed)
Physical Therapy Evaluation Patient Details Name: Theresa Lynch MRN: [id number] DOB: [birth year] Today's Date: 08/22/2012 Time: 4098-1191 PT Time Calculation (min): 20 min  PT Assessment / Plan / Recommendation History of Present Illness  79 year old female admitted 08/21/12 from CIR due to chest pain and low K, following 08/16/12 admission following a fall/ facial lac. CT revealed extensive SAH in the suprasellar cistern and within the right sylvian fissure, as well as Left temporoparietal hemotoma.  Clinical Impression  Pt admitted with above. Pt currently with functional limitations due to the deficits listed below (see PT Problem List). ** Pt will benefit from skilled PT to increase their independence and safety with mobility to allow discharge to the venue listed below.       PT Assessment  Patient needs continued PT services    Follow Up Recommendations  CIR    Does the patient have the potential to tolerate intense rehabilitation      Barriers to Discharge        Equipment Recommendations  Other (comment) (TBA)    Recommendations for Other Services     Frequency Min 4X/week    Precautions / Restrictions Precautions Precautions: Fall Precaution Comments: daughter reports no h/o imbalance PTA--unclear why she fell   Pertinent Vitals/Pain No signs of pain. VSS      Mobility  Bed Mobility Bed Mobility: Supine to Sit;Sitting - Scoot to Edge of Bed;Sit to Supine Supine to Sit: 3: Mod assist Sitting - Scoot to Edge of Bed: 3: Mod assist Sit to Supine: 4: Min assist Details for Bed Mobility Assistance: Verbal and tactile cueing for initation and sequencing of task.  Transfers Sit to Stand: 3: Mod assist;From bed;With upper extremity assist;From chair/3-in-1 Stand to Sit: 3: Mod assist;To bed;To chair/3-in-1;With armrests;With upper extremity assist Details for Transfer Assistance: Assist for cueing and intiation of task.  Pt with incr time to perform stand<>sit likely due  to motor planning deficit.    Exercises     PT Diagnosis: Difficulty walking;Generalized weakness;Altered mental status  PT Problem List: Decreased activity tolerance;Decreased balance;Decreased mobility;Decreased cognition;Decreased knowledge of use of DME;Decreased safety awareness;Decreased knowledge of precautions PT Treatment Interventions: DME instruction;Gait training;Functional mobility training;Therapeutic activities;Therapeutic exercise;Patient/family education;Cognitive remediation;Balance training     PT Goals(Current goals can be found in the care plan section) Acute Rehab PT Goals Patient Stated Goal: did not state PT Goal Formulation: Patient unable to participate in goal setting Time For Goal Achievement: 08/29/12 Potential to Achieve Goals: Good  Visit Information  Last PT Received On: 08/22/12 Assistance Needed: +2 (for amb) History of Present Illness: 79 year old female admitted 08/21/12 from CIR due to chest pain and low K, following 08/16/12 admission following a fall/ facial lac. CT revealed extensive SAH in the suprasellar cistern and within the right sylvian fissure, as well as Left temporoparietal hemotoma.       Prior Functioning  Home Living Family/patient expects to be discharged to:: Inpatient rehab Living Arrangements: Spouse/significant other;Children Available Help at Discharge: Family;Friend(s);Available 24 hours/day Type of Home: House Home Access: Stairs to enter [employer] of Steps: 1 Entrance Stairs-Rails: None Home Layout: One level Home Equipment: Walker - 2 wheels Additional Comments: Pt and family hoping for return to CIR.  Lives With: Spouse Prior Function Level of Independence: Independent Communication Communication: Expressive difficulties;Receptive difficulties Dominant Hand: Right    Cognition  Cognition Arousal/Alertness: Awake/alert Behavior During Therapy: Flat affect Overall Cognitive Status: Impaired/Different  from baseline Area of Impairment: Attention;Following commands;Problem solving Orientation  Level: Place;Time;Disoriented to Current Attention Level: Sustained Following Commands: Follows one step commands inconsistently;Follows one step commands with increased time Problem Solving: Slow processing;Decreased initiation;Difficulty sequencing;Requires verbal cues;Requires tactile cues General Comments: Requires simple and direct one step commands.   Rancho Levels of Cognitive Functioning [place]igos Scales of Cognitive Functioning: Confused/appropriate    Extremity/Trunk Assessment Lower Extremity Assessment Lower Extremity Assessment: Generalized weakness   Balance Balance Balance Assessed: Yes Static Sitting Balance Static Sitting - Balance Support: No upper extremity supported;Feet supported Static Sitting - Level of Assistance: 4: Min assist  End of Session PT - End of Session Equipment Utilized During Treatment: Gait belt Activity Tolerance: Patient tolerated treatment well Patient left: in bed;with call bell/phone within reach;with bed alarm set Nurse Communication: Mobility status  GP     [facility] 08/22/2012, 4:34 PM  [facility] PT [phone]

## 2012-08-23 ENCOUNTER — Inpatient Hospital Stay (HOSPITAL_COMMUNITY)
Admission: RE | Admit: 2012-08-23 | Discharge: 2012-09-05 | DRG: 945 | Disposition: A | Discharge status: Home / Self Care | Payer: Medicare Other | Source: Intra-hospital | Attending: Physical Medicine & Rehabilitation | Admitting: Physical Medicine & Rehabilitation

## 2012-08-23 DIAGNOSIS — S0280XA Fracture of other specified skull and facial bones, unspecified side, initial encounter for closed fracture: Secondary | ICD-10-CM

## 2012-08-23 DIAGNOSIS — Z5189 Encounter for other specified aftercare: Secondary | ICD-10-CM | POA: Diagnosis present

## 2012-08-23 DIAGNOSIS — J329 Chronic sinusitis, unspecified: Secondary | ICD-10-CM

## 2012-08-23 DIAGNOSIS — M129 Arthropathy, unspecified: Secondary | ICD-10-CM

## 2012-08-23 DIAGNOSIS — F411 Generalized anxiety disorder: Secondary | ICD-10-CM | POA: Diagnosis not present

## 2012-08-23 DIAGNOSIS — E871 Hypo-osmolality and hyponatremia: Secondary | ICD-10-CM

## 2012-08-23 DIAGNOSIS — I609 Nontraumatic subarachnoid hemorrhage, unspecified: Secondary | ICD-10-CM

## 2012-08-23 DIAGNOSIS — D72829 Elevated white blood cell count, unspecified: Secondary | ICD-10-CM | POA: Diagnosis present

## 2012-08-23 DIAGNOSIS — E785 Hyperlipidemia, unspecified: Secondary | ICD-10-CM

## 2012-08-23 DIAGNOSIS — I10 Essential (primary) hypertension: Secondary | ICD-10-CM

## 2012-08-23 DIAGNOSIS — E876 Hypokalemia: Secondary | ICD-10-CM | POA: Diagnosis present

## 2012-08-23 DIAGNOSIS — J9600 Acute respiratory failure, unspecified whether with hypoxia or hypercapnia: Secondary | ICD-10-CM

## 2012-08-23 DIAGNOSIS — R079 Chest pain, unspecified: Secondary | ICD-10-CM

## 2012-08-23 DIAGNOSIS — I619 Nontraumatic intracerebral hemorrhage, unspecified: Secondary | ICD-10-CM | POA: Diagnosis present

## 2012-08-23 DIAGNOSIS — F329 Major depressive disorder, single episode, unspecified: Secondary | ICD-10-CM | POA: Diagnosis not present

## 2012-08-23 DIAGNOSIS — W1809XA Striking against other object with subsequent fall, initial encounter: Secondary | ICD-10-CM | POA: Diagnosis not present

## 2012-08-23 DIAGNOSIS — D62 Acute posthemorrhagic anemia: Secondary | ICD-10-CM | POA: Diagnosis present

## 2012-08-23 DIAGNOSIS — Z79899 Other long term (current) drug therapy: Secondary | ICD-10-CM

## 2012-08-23 DIAGNOSIS — S069XAA Unspecified intracranial injury with loss of consciousness status unknown, initial encounter: Secondary | ICD-10-CM

## 2012-08-23 DIAGNOSIS — S06309A Unspecified focal traumatic brain injury with loss of consciousness of unspecified duration, initial encounter: Secondary | ICD-10-CM | POA: Diagnosis not present

## 2012-08-23 DIAGNOSIS — R55 Syncope and collapse: Secondary | ICD-10-CM

## 2012-08-23 DIAGNOSIS — G4700 Insomnia, unspecified: Secondary | ICD-10-CM

## 2012-08-23 DIAGNOSIS — F3289 Other specified depressive episodes: Secondary | ICD-10-CM

## 2012-08-23 DIAGNOSIS — S069X9A Unspecified intracranial injury with loss of consciousness of unspecified duration, initial encounter: Secondary | ICD-10-CM

## 2012-08-23 DIAGNOSIS — S065XAA Traumatic subdural hemorrhage with loss of consciousness status unknown, initial encounter: Secondary | ICD-10-CM

## 2012-08-23 DIAGNOSIS — H409 Unspecified glaucoma: Secondary | ICD-10-CM | POA: Diagnosis not present

## 2012-08-23 DIAGNOSIS — S02109S Fracture of base of skull, unspecified side, sequela: Secondary | ICD-10-CM

## 2012-08-23 DIAGNOSIS — S02109A Fracture of base of skull, unspecified side, initial encounter for closed fracture: Secondary | ICD-10-CM

## 2012-08-23 LAB — BASIC METABOLIC PANEL
Chloride: 100 mEq/L (ref 96–112)
GFR calc Af Amer: >90 mL/min (ref >90)
GFR calc non Af Amer: 78 mL/min — ABNORMAL LOW (ref >90)
Potassium: 3.4 mEq/L — ABNORMAL LOW (ref 3.5–5.1)

## 2012-08-23 MED ORDER — GUAIFENESIN-DM 100-10 MG/5ML PO SYRP: 5.0000 mL | ORAL_SOLUTION | Freq: Four times a day (QID) | ORAL | Status: DC | PRN

## 2012-08-23 MED ORDER — ALUM & MAG HYDROXIDE-SIMETH 200-200-20 MG/5ML PO SUSP: 30.0000 mL | Freq: Four times a day (QID) | ORAL | Status: DC | PRN

## 2012-08-23 MED ORDER — BISACODYL 10 MG RE SUPP: 10.0000 mg | Freq: Every day | RECTAL | Status: DC | PRN

## 2012-08-23 MED ORDER — METOPROLOL TARTRATE 25 MG PO TABS
25.0000 mg | ORAL_TABLET | Freq: Two times a day (BID) | ORAL | Status: DC
  Filled 2012-08-23: qty 1

## 2012-08-23 MED ORDER — POLYETHYLENE GLYCOL 3350 17 G PO PACK
17.0000 g | PACK | Freq: Every day | ORAL | Status: DC | PRN
  Filled 2012-08-23: qty 1

## 2012-08-23 MED ORDER — ONDANSETRON HCL 4 MG PO TABS
4.0000 mg | ORAL_TABLET | Freq: Four times a day (QID) | ORAL | Status: DC | PRN
  Administered 2012-08-27 – 2012-08-28 (×2): 4 mg via ORAL
  Filled 2012-08-23 (×2): qty 1

## 2012-08-23 MED ORDER — SODIUM CHLORIDE 0.9 % IJ SOLN
10.0000 mL | INTRAMUSCULAR | Status: DC | PRN
  Administered 2012-08-23: 10 mL

## 2012-08-23 MED ORDER — POTASSIUM CHLORIDE ER 10 MEQ PO TBCR
20.0000 meq | EXTENDED_RELEASE_TABLET | Freq: Two times a day (BID) | ORAL | Status: DC
  Administered 2012-08-23 – 2012-09-05 (×26): 20 meq via ORAL
  Filled 2012-08-23 (×28): qty 2

## 2012-08-23 MED ORDER — ATORVASTATIN CALCIUM 10 MG PO TABS
10.0000 mg | ORAL_TABLET | Freq: Every day | ORAL | Status: DC
  Administered 2012-08-24 – 2012-09-05 (×13): 10 mg via ORAL
  Filled 2012-08-23 (×15): qty 1

## 2012-08-23 MED ORDER — POTASSIUM CHLORIDE ER 10 MEQ PO TBCR
10.0000 meq | EXTENDED_RELEASE_TABLET | Freq: Two times a day (BID) | ORAL | Status: DC
  Administered 2012-08-23: 10 meq via ORAL
  Filled 2012-08-23 (×2): qty 1

## 2012-08-23 MED ORDER — ACETAMINOPHEN 325 MG PO TABS
325.0000 mg | ORAL_TABLET | ORAL | Status: DC | PRN
  Administered 2012-08-26: 650 mg via ORAL
  Filled 2012-08-23 (×3): qty 2

## 2012-08-23 MED ORDER — ALUM & MAG HYDROXIDE-SIMETH 200-200-20 MG/5ML PO SUSP: 30.0000 mL | ORAL | Status: DC | PRN

## 2012-08-23 MED ORDER — ASPIRIN EC 81 MG PO TBEC
81.0000 mg | DELAYED_RELEASE_TABLET | Freq: Every day | ORAL | Status: DC
  Administered 2012-08-24 – 2012-09-05 (×13): 81 mg via ORAL
  Filled 2012-08-23 (×15): qty 1

## 2012-08-23 MED ORDER — CYANOCOBALAMIN 500 MCG PO TABS
500.0000 ug | ORAL_TABLET | Freq: Every day | ORAL | Status: DC
  Administered 2012-08-24 – 2012-09-05 (×13): 500 ug via ORAL
  Filled 2012-08-23 (×15): qty 1

## 2012-08-23 MED ORDER — ONDANSETRON HCL 4 MG/2ML IJ SOLN: 4.0000 mg | Freq: Four times a day (QID) | INTRAMUSCULAR | Status: DC | PRN

## 2012-08-23 MED ORDER — DIPHENHYDRAMINE HCL 12.5 MG/5ML PO ELIX: 12.5000 mg | ORAL_SOLUTION | Freq: Four times a day (QID) | ORAL | Status: DC | PRN

## 2012-08-23 MED ORDER — TRAZODONE HCL 50 MG PO TABS: 25.0000 mg | ORAL_TABLET | Freq: Every evening | ORAL | Status: DC | PRN

## 2012-08-23 MED ORDER — PAROXETINE HCL 20 MG PO TABS
40.0000 mg | ORAL_TABLET | Freq: Every day | ORAL | Status: DC
  Administered 2012-08-23 – 2012-08-28 (×6): 40 mg via ORAL
  Filled 2012-08-23 (×7): qty 2

## 2012-08-23 MED ORDER — FLEET ENEMA 7-19 GM/118ML RE ENEM: 1.0000 | ENEMA | Freq: Once | RECTAL | Status: AC | PRN

## 2012-08-23 MED ORDER — ENSURE PUDDING PO PUDG
1.0000 | Freq: Three times a day (TID) | ORAL | Status: DC
  Administered 2012-08-23 – 2012-09-04 (×18): 1 via ORAL

## 2012-08-23 MED ORDER — METOPROLOL TARTRATE 25 MG PO TABS
25.0000 mg | ORAL_TABLET | Freq: Two times a day (BID) | ORAL | Status: DC
  Administered 2012-08-23 – 2012-09-05 (×25): 25 mg via ORAL
  Filled 2012-08-23 (×29): qty 1

## 2012-08-23 MED ORDER — TIMOLOL MALEATE 0.5 % OP SOLN
1.0000 [drp] | Freq: Every day | OPHTHALMIC | Status: DC
  Administered 2012-08-23 – 2012-09-04 (×13): 1 [drp] via OPHTHALMIC
  Filled 2012-08-23: qty 5

## 2012-08-23 NOTE — Discharge Summary (Signed)
Physician Discharge Summary  Patient ID: Theresa Lynch MRN: [id number] DOB/AGE: 09-22-1929 79 y.o.  Admit date: 08/21/2012 Discharge date: 08/23/2012  Primary Care Physician:  Marisue Ivan  Discharge Diagnoses:    . Chest pain- resolved . Hypokalemia . Hyponatremia- resolved . Syncope Subarachnoid hemorrhage Closed fracture of facial bones  Hyperlipidemia Depression  Consults:  None   Recommendations for Outpatient Follow-up:  1. Please avoid diuretics as it can cause syncope/dizziness. 2-D echo showed hyperdynamic systolic function with EF of 70-75%, dynamic obstruction at rest with mid cavity obliteration. Placed on metoprolol 25 mg BID for resting heart rate of 60's. Patient should not be on hydrochlorothiazide which she was taking outpatient.   2. medications to be continued at a final discharge Metoprolol 25 mg BID   Allergies:   Allergies  Allergen Reactions  . Codeine     nausea     Discharge Medications:   Medication List    ASK your doctor about these medications       atorvastatin 10 MG tablet  Commonly known as:  LIPITOR  Take 10 mg by mouth daily.     bacitracin ointment  Apply topically 2 (two) times daily.     feeding supplement Pudg  Take 1 Container by mouth 3 (three) times daily between meals.     HYDROcodone-acetaminophen 5-325 MG per tablet  Commonly known as:  NORCO/VICODIN  Take 1-2 tablets by mouth every 4 (four) hours as needed.     PARoxetine 20 MG tablet  Commonly known as:  PAXIL  Take 40 mg by mouth at bedtime.     polyethylene glycol packet  Commonly known as:  MIRALAX / GLYCOLAX  Take 17 g by mouth daily as needed.     potassium chloride 10 MEQ/100ML  Inject 100 mLs (10 mEq total) into the vein every 1 hour x 4 doses.     potassium chloride 10 MEQ tablet  Commonly known as:  K-DUR  Take 10 mEq by mouth 2 (two) times daily.     timolol 0.5 % ophthalmic solution  Commonly known as:  TIMOPTIC  Place 1 drop into both  eyes at bedtime.     traZODone 50 MG tablet  Commonly known as:  DESYREL  Take 50 mg by mouth at bedtime.     vitamin B-12 500 MCG tablet  Commonly known as:  CYANOCOBALAMIN  Take 500 mcg by mouth daily.         Brief H and P: For complete details please refer to admission H and P, but in briefSara R Lynch is a 79 y.o. female with a history of HLD, OA, Glaucoma, depression and claustrophobia who was admitted to [facility] on 7/23 after being found down next to her mailbox by a neighbor. In the ED she was found to have multiple right facial fractures and an extensive subarachnoid hemorrhage. Her GCS was 9-10, and due to decreased LOC she was intubated. Neuro surgery recommended conservative management. At the time of extubation she was confused and found to have expressive aphasia with an unsteady gait. She was admitted to [facility] on 08/20/12. This morning (7/30) at approximately 8:30 am the patient was working with PT and went to the bathroom. She apparently had a presyncopal episode, became pale, diaphoretic, and per her daughter, was lying in bed grimacing holding her fist to her chest. Stat labs revealed a K of less that 2.0, sodium of 129, and a WBC of 13.2. EKG showed a junctional rhythm  with poor R wave progression. The patient appeared to normalize but then had a second episode of chest pain and diaphoresis at approximately 9:15 am. The patient (who is a very poor historian) does not report chest pain, but rather tells me she has "[place]" pain in her right forearm. Per her Husband, [name], and daughter, she has never been seen by a cardiologist or had a history of cardiac problems. [name] was on HCTZ and potassium supplements at home.    Hospital Course:   Chest pain : Atypical resolved, cardiac enzymes remained negative, 2-D echo showed EF of 70-75%, no regional wall motion abnormalities, grade 3 diastolic dysfunction.  2-D echo showed hyperdynamic systolic  function with EF of 16-10%, dynamic obstruction at rest with mid cavity obliteration.I discussed the ECHO findings with Dr Elease Hashimoto (cardiology). Placed on metoprolol 25 mg BID for resting heart rate of 60's. Patient should not be on hydrochlorothiazide which she was taking outpatient. Avoid diuretics or dehydration.  Hypokalemia: Likely secondary to HCTZ without any potassium supplementation. This was replaced to daily and recommended to continue replacement for another 3 days   Hyponatremia- resolved   Syncope - Likely secondary to hypokalemia, 2-D echo showed no regional wall motion abnormalities, grade 3 diastolic dysfunction.  UA negative for UTI. Chest x-ray showed no focal consolidation.    Day of Discharge BP 133/85  Pulse 73  Temp(Src) 99.5 F (37.5 C) (Oral)  Resp 16  SpO2 93%  Physical Exam:  General: Alert and awake, not in any acute distress, multiple facial bruises.  CVS: S1-S2 clear, Chest: CTAB Abdomen: soft NT, ND, NBS  Extremities: no c/c/e bilaterally  Neuro: Cranial nerves II-XII intact, no focal neurological deficits   The results of significant diagnostics from this hospitalization (including imaging, microbiology, ancillary and laboratory) are listed below for reference.    LAB RESULTS: Basic Metabolic Panel:  Recent Labs Lab 08/21/12 1802  08/22/12 0521 08/23/12 0538  NA 132*  < > 133* 135  K 2.7*  < > 3.1* 3.4*  CL 92*  < > 98 100  CO2 29  < > 26 25  GLUCOSE 115*  < > 111* 107*  BUN 16  < > 15 13  CREATININE 0.72  < > 0.72 0.72  CALCIUM 8.3*  < > 8.0* 8.1*  MG 2.0  --   --   --   < > = values in this interval not displayed. Liver Function Tests:  Recent Labs Lab 08/21/12 0550  AST 15  ALT 14  ALKPHOS 43  BILITOT 0.9  PROT 6.7  ALBUMIN 2.5*   No results found for this basename: LIPASE, AMYLASE,  in the last 168 hours No results found for this basename: AMMONIA,  in the last 168 hours CBC:  Recent Labs Lab 08/21/12 0550  08/22/12 0521  WBC 13.2* 14.0*  NEUTROABS 9.0*  --   HGB 11.5* 10.3*  HCT 32.0* 29.4*  MCV 89.1 90.2  PLT 392 419*   Cardiac Enzymes:  Recent Labs Lab 08/21/12 1802 08/22/12 0015  TROPONINI <0.30 <0.30   BNP: No components found with this basename: POCBNP,  CBG:  Recent Labs Lab 08/16/12 2203 08/21/12 0834  GLUCAP 130* 134*    Significant Diagnostic Studies:  Ct Head Wo Contrast  08/21/2012   *RADIOLOGY REPORT*  Clinical Data: Head trauma with the previous subarachnoid hemorrhage.  Follow-up.  CT HEAD WITHOUT CONTRAST  Technique:  Contiguous axial images were obtained from the base of the skull through the  vertex without contrast.  Comparison: 08/17/2012  Findings: Hemorrhagic contusions at the right temporal tip, left occipital lobe, and left temporo-parietal junction region are again seen, without evidence of increased bleeding.  Right temporal tip contusion measures 12 mm.  Left occipital contusion measures 9 mm. Left temporo-parietal contusion measures 2.7 cm.  Mild surrounding vasogenic edema without significant mass effect or shift. Subarachnoid hemorrhage previously seen is no longer visible.  No subdural hematoma.  Motion prohibits accurate evaluation of the previously seen facial fractures, but no change is suspected.  IMPRESSION: No new or increased intracranial hemorrhage.  Hemorrhagic contusions of the right temporal tip, left occipital lobe and left temporal parietal junction are not changed.  Resolution of previously seen subarachnoid blood.   Original Report Authenticated By: Paulina Fusi, M.D.   Dg Chest Port 1 View  08/21/2012   *RADIOLOGY REPORT*  Clinical Data: Status post PICC line placement for IV access.  PORTABLE CHEST - 1 VIEW  Comparison: 08/17/2012  Findings: Right upper extremity PICC line is in place.  Catheter tip projects at the level of the mid SVC.  Lungs show bibasilar atelectasis with potential component of small bilateral pleural effusions.  No overt  pulmonary edema or focal airspace consolidation is seen.  Heart size is normal.  IMPRESSION: PICC line tip lies in the mid SVC.   Original Report Authenticated By: Irish Lack, M.D.    2D ECHO: Study Conclusions  - Left ventricle: The cavity size was normal. Systolic function was hyperdynamic. The estimated ejection fraction was in the range of 70% to 75%. There was dynamic obstruction at rest, with mid-cavity obliteration, a peak velocity of 1.8cm/sec, and a peak gradient of 0mm Hg. Wall motion was normal; there were no regional wall motion abnormalities. Doppler parameters are consistent with a reversible restrictive pattern, indicative of decreased left ventricular diastolic compliance and/or increased left atrial pressure (grade 3 diastolic dysfunction). - Mitral valve: Mildly calcified annulus. Mildly calcified leaflets .    Disposition and Follow-up:    DISPOSITION: Inpatient rehabilitation DIET: Heart healthy diet ACTIVITY: As tolerated  TESTS THAT NEED FOLLOW-UP Please check a BMET in 3 days, and stop or decrease potassium supplementation to daily (currently BID)  if K is normal above 3.4  DISCHARGE FOLLOW-UP Follow-up Information   Follow up with LINTHAVONG,KANHKA. Schedule an appointment as soon as possible for a visit in 2 weeks. (after discharge)    Contact information:   [facility] [address] [place] [address] [phone]       Time spent on Discharge: 35 minutes  Signed:   RAI,RIPUDEEP M.D. [facility] 08/23/2012, 1:23 PM Pager: [phone]
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
## 2012-08-23 NOTE — Progress Notes (Signed)
Physical Therapy Treatment Patient Details Name: Theresa Lynch MRN: [id number] DOB: May 22, 1929 Today's Date: 08/23/2012 Time: 1011-1050 PT Time Calculation (min): 39 min  PT Assessment / Plan / Recommendation  History of Present Illness 79 year old female admitted 08/21/12 from CIR due to chest pain and low K, following 08/16/12 admission following a fall/ facial lac. CT revealed extensive SAH in the suprasellar cistern and within the right sylvian fissure, as well as Left temporoparietal hemotoma.   PT Comments   Making good progress today. Pt struggling slightly with motivation however willing to work with min encouragement. Pt mod assist with dynamic balance activities and gait however needs a chair close behind her with ambulation as she becomes very unstable with loss of focus and fatigue, needs to sit quickly.   Follow Up Recommendations  CIR     Does the patient have the potential to tolerate intense rehabilitation   Potentially     Equipment Recommendations  Other (comment) (TBA)    Recommendations for Other Services Rehab consult  Frequency Min 4X/week   Progress towards PT Goals Progress towards PT goals: Progressing toward goals  Plan Current plan remains appropriate    Precautions / Restrictions Precautions Precautions: Fall   Pertinent Vitals/Pain No c/o pain    Mobility  Bed Mobility Bed Mobility: Supine to Sit;Sitting - Scoot to Edge of Bed;Sit to Supine Supine to Sit: 3: Mod assist Sitting - Scoot to Edge of Bed: 3: Mod assist Sit to Supine: 5: Supervision Details for Bed Mobility Assistance: Verbal and tactile cueing for initation and sequencing of task. Pt attempting by self initially but unable to lift chest off bed, cues for rolling to side then pushing to sit. Facilitation of machanics. Pt attempting to return to bed x 3 while sitting on EOB, needed verbal and tactile reminders to stay sitting.  Transfers Transfers: Sit to Stand;Stand to Sit Sit to Stand: 3:  Mod assist;From bed;With upper extremity assist;From chair/3-in-1 Stand to Sit: 3: Mod assist;To bed;To chair/3-in-1;With armrests;With upper extremity assist Details for Transfer Assistance: Assist for anterior weight shift into standing and cues for intiation of and attention to task.  Still with motor planning deficits however appear improved from yesterday Ambulation/Gait Ambulation/Gait Assistance: 3: Mod assist Ambulation Distance (Feet): 75 Feet Assistive device: 1 person hand held assist Ambulation/Gait Assistance Details: Pt finding support from pushing IV pole and HHA from therapist. Attempted to perform 180 degree turn in hall to go back to room but pt had significant difficulty with motor planning, nearly "froze" in place and required seated rest as she became laterally unstable. Pt needs a chair to follow for safety.  Gait Pattern: Step-through pattern;Trunk flexed;Left flexed knee in stance;Lateral trunk lean to right;Decreased stride length;Decreased stance time - left    Exercises General Exercises - Lower Extremity Mini-Sqauts: AROM;Both;10 reps     PT Goals (current goals can now be found in the care plan section) Acute Rehab PT Goals Patient Stated Goal: Go back to bed today, eventually go home Time For Goal Achievement: 08/29/12 Potential to Achieve Goals: Good  Visit Information  Last PT Received On: 08/23/12 Assistance Needed: +2 (for chair to follow) History of Present Illness: 79 year old female admitted 08/21/12 from CIR due to chest pain and low K, following 08/16/12 admission following a fall/ facial lac. CT revealed extensive SAH in the suprasellar cistern and within the right sylvian fissure, as well as Left temporoparietal hemotoma.    Subjective Data  Patient Stated Goal: Go  back to bed today, eventually go home   Cognition  Cognition Arousal/Alertness: Awake/alert Behavior During Therapy: Flat affect Overall Cognitive Status: Impaired/Different from  baseline Area of Impairment: Attention;Following commands;Problem solving;Memory;Safety/judgement;Awareness Orientation Level: Time;Disoriented to Current Attention Level: Sustained Memory: Decreased short-term memory Following Commands: Follows one step commands with increased time;Follows one step commands consistently Safety/Judgement: Decreased awareness of safety Problem Solving: Slow processing;Decreased initiation;Difficulty sequencing;Requires verbal cues;Requires tactile cues General Comments: Requires simple and direct one step commands.   Rancho Levels of Cognitive Functioning Rancho Los Amigos Scales of Cognitive Functioning: Confused/appropriate    Balance  Balance Balance Assessed: Yes Static Sitting Balance Static Sitting - Balance Support: Feet unsupported;Right upper extremity supported Static Sitting - Level of Assistance: 4: Min assist (to prevent return to bed) Dynamic Standing Balance Dynamic Standing - Balance Support: Right upper extremity supported (HHA) Dynamic Standing - Level of Assistance: 3: Mod assist;4: Min assist Dynamic Standing - Balance Activities: Lateral lean/weight shifting;Forward lean/weight shifting;Reaching for objects Dynamic Standing - Comments: Practiced mini squats balancing with center of gravity at various heights; toe taps to anterior target working on single limb stance balance.   End of Session PT - End of Session Equipment Utilized During Treatment: Gait belt Activity Tolerance: Patient tolerated treatment well Patient left: with call bell/phone within reach;in chair;with family/visitor present Nurse Communication: Mobility status (family in room to make sure pt doesn't get out of chair)   GP     Wilhemina Bonito 08/23/2012, 11:28 AM

## 2012-08-23 NOTE — Progress Notes (Signed)
I met with patient at bedside and then contacted her daughter, Edrick Kins, by phone. They prefer inpt rehab admission , not SNF. I have begun insurance approval to admit pt today. I have discussed with Dr. Isidoro Donning, RN, and RN CM. [phone]

## 2012-08-23 NOTE — Plan of Care (Addendum)
Overall Plan of Care [facility]) Patient Details Name: IZUMI MIXON MRN: [id number] DOB: 06-23-1929  Diagnosis:  TBI  Co-morbidities: ABLA, hypokalemia with cardiac sequelae, leukocytosis  Functional Problem List  Patient demonstrates impairments in the following areas: Balance, Bladder, Bowel, Cognition, Endurance, Medication Management, Motor, Pain, Safety, Sensory  and Skin Integrity  Basic ADL's: grooming, bathing, dressing, toileting and cognition Advanced ADL's: none  Transfers:  bed mobility, bed to chair, toilet, tub/shower, car and furniture Locomotion:  ambulation and stairs  Additional Impairments:  Swallowing, Communication  comprehension and expression and Social Cognition   social interaction, problem solving, memory, attention and awareness  Anticipated Outcomes Item Anticipated Outcome  Eating/Swallowing  Supervision  Basic self-care  supervision  Tolieting  Supervision   Bowel/Bladder  Continent bowel/bladder   Transfers  S/Mod-I   Locomotion  S/Mod-I using LRAD gait x 150' S/Mod-I up/down 1 step with LRAD  Communication  Min A  Cognition  Min A  Pain  Pain managed with prn medications  Safety/Judgment  Min A  Other  No new skin breakdown while on rehab   Therapy Plan: PT Intensity: Minimum of 1-2 x/day ,45 to 90 minutes PT Frequency: 5 out of 7 days PT Duration Estimated Length of Stay: 5-7 days OT Intensity: Minimum of 1-2 x/day, 45 to 90 minutes OT Frequency: 5 out of 7 days OT Duration/Estimated Length of Stay: 8-10 days SLP Intensity: Minumum of 1-2 x/day, 30 to 90 minutes SLP Frequency: 5 out of 7 days SLP Duration/Estimated Length of Stay: 2 weeks    Team Interventions: Item RN PT OT SLP SW TR Other  Self Care/Advanced ADL Retraining   X      Neuromuscular Re-Education         Therapeutic Activities  x X x  x   UE/LE Strength Training/ROM  x X   x   UE/LE Coordination Activities  x X   x   Visual/Perceptual Remediation/Compensation   x X x  x   DME/Adaptive Equipment Instruction  x X   x   Therapeutic Exercise  x X   x   Balance/Vestibular Training  x X   x   Patient/Family Education x x X x  x   Cognitive Remediation/Compensation  x X x  x   Functional Mobility Training  x X   x   Ambulation/Gait Training  x       Stair Training  x       Wheelchair Propulsion/Positioning         Health and safety inspector Reintegration      x   Dysphagia/Aspiration Precaution Training    x     Speech/Language Facilitation    x     Bladder Management x        Bowel Management x        Disease Management/Prevention x        Pain Management x        Medication Management x        Skin Care/Wound Management x        Splinting/Orthotics         Discharge Planning   X x x x   Psychosocial Support   X x x x                          Team Discharge Planning: Destination: PT-Home ,OT-  HOME , SLP-Home Projected  Follow-up: PT-Home health PT, OT- HOME HEALTH  , SLP-Outpatient SLP;24 hour supervision/assistance Projected Equipment Needs: PT- , OT-SHOWER SEAT  , SLP-None recommended by SLP Patient/family involved in discharge planning: PT- Patient;Family member/caregiver,  OT-Patient/family or caregiver SLP-Patient;Family member/caregiver  MD ELOS: 7 days Medical Rehab Prognosis:  Excellent Assessment: The patient has been admitted for CIR therapies. The team will be addressing, functional mobility, strength, stamina, balance, safety, adaptive techniques/equipment, self-care, bowel and bladder mgt, patient and caregiver education, NMR, cognition, language, vestibular rx. Goals have been set at supervision to modified independent.    Ranelle Oyster, MD, FAAPMR     See Team Conference Notes for weekly updates to the plan of care

## 2012-08-23 NOTE — Interval H&P Note (Signed)
SHATIMA ZALAR was admitted today to Inpatient Rehabilitation with the diagnosis of TBI .  The patient's history has been reviewed, patient examined, and there is no change in status.  Patient continues to be appropriate for intensive inpatient rehabilitation.  I have reviewed the patient's chart and labs.  Questions were answered to the patient's satisfaction.  SWARTZ,ZACHARY T 08/23/2012, 5:57 PM

## 2012-08-23 NOTE — Progress Notes (Signed)
PT Note - Late Entry for G-codes   05-Sep-2012 1635  PT G-Codes **NOT FOR INPATIENT CLASS**  Functional Assessment Tool Used clinical judgement  Functional Limitation Mobility: Walking and moving around  Mobility: Walking and Moving Around Current Status [phone]) CK  Mobility: Walking and Moving Around Goal Status [phone]) CI   [facility] PT [phone]
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
## 2012-08-23 NOTE — Progress Notes (Signed)
Pt admitted to 4W22 at 1600. Family at bedside. Family and pt reoriented to unit, call bell system, safety plan, and rehab schedule. Rehab brain injury education packet at bedside from previous admission, education given to family, family verbalized understanding. Hedy Camara
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
## 2012-08-23 NOTE — Progress Notes (Signed)
I have insurance approval to admit pt to inpt rehab today and I will arrange. Family is aware and in agreement. [phone]

## 2012-08-24 ENCOUNTER — Inpatient Hospital Stay (HOSPITAL_COMMUNITY): Payer: Medicare Other | Admitting: Speech Pathology

## 2012-08-24 ENCOUNTER — Inpatient Hospital Stay (HOSPITAL_COMMUNITY): Payer: Medicare Other | Admitting: Physical Therapy

## 2012-08-24 ENCOUNTER — Inpatient Hospital Stay (HOSPITAL_COMMUNITY): Payer: Medicare Other | Admitting: *Deleted

## 2012-08-24 DIAGNOSIS — E871 Hypo-osmolality and hyponatremia: Secondary | ICD-10-CM

## 2012-08-24 DIAGNOSIS — S0291XS Unspecified fracture of skull, sequela: Secondary | ICD-10-CM

## 2012-08-24 DIAGNOSIS — R55 Syncope and collapse: Secondary | ICD-10-CM

## 2012-08-24 DIAGNOSIS — J9600 Acute respiratory failure, unspecified whether with hypoxia or hypercapnia: Secondary | ICD-10-CM

## 2012-08-24 NOTE — Progress Notes (Signed)
Theresa Lynch is a 79 y.o. female [birth year] [id number]  Subjective: No new complaints. No new problems. Slept well. Feeling OK.  Objective: Vital signs in last 24 hours: Temp:  [98.1 F (36.7 C)-98.9 F (37.2 C)] 98.1 F (36.7 C) (08/02 1440) Pulse Rate:  [64-102] 64 (08/02 1440) Resp:  [19] 19 (08/02 1440) BP: (128-156)/(79-94) 128/94 mmHg (08/02 1440) SpO2:  [94 %-97 %] 94 % (08/02 1440) FiO2 (%):  [95 %] 95 % (08/01 1651) Weight:  [150 lb 5.7 oz (68.2 kg)] 150 lb 5.7 oz (68.2 kg) (08/01 1651) Weight change:  Last BM Date: 08/24/12  Intake/Output from previous day:   Last cbgs: CBG (last 3)  No results found for this basename: GLUCAP,  in the last 72 hours   Physical Exam General: No apparent distress    HEENT: moist mucosa Lungs: Normal effort. Lungs clear to auscultation, no crackles or wheezes. Cardiovascular: Regular rate and rhythm, no edema Musculoskeletal:  No change from before Neurological: No new neurological deficits Wounds: N/A    Skin: bruised face Alert, cooperative   Lab Results: BMET    Component Value Date/Time   NA 135 08/23/2012 0538   K 3.4* 08/23/2012 0538   CL 100 08/23/2012 0538   CO2 25 08/23/2012 0538   GLUCOSE 107* 08/23/2012 0538   BUN 13 08/23/2012 0538   CREATININE 0.72 08/23/2012 0538   CALCIUM 8.1* 08/23/2012 0538   GFRNONAA 78* 08/23/2012 0538   GFRAA >90 08/23/2012 0538   CBC    Component Value Date/Time   WBC 14.0* 08/22/2012 0521   RBC 3.26* 08/22/2012 0521   HGB 10.3* 08/22/2012 0521   HCT 29.4* 08/22/2012 0521   PLT 419* 08/22/2012 0521   MCV 90.2 08/22/2012 0521   MCH 31.6 08/22/2012 0521   MCHC 35.0 08/22/2012 0521   RDW 13.7 08/22/2012 0521   LYMPHSABS 2.4 08/21/2012 0550   MONOABS 1.7* 08/21/2012 0550   EOSABS 0.0 08/21/2012 0550   BASOSABS 0.0 08/21/2012 0550    Studies/Results: No results found.  Medications: I have reviewed the patient's current medications.  Assessment/Plan:  1. DVT Prophylaxis/Anticoagulation: Mechanical:  Sequential compression devices, below knee Bilateral lower extremities  2. Pain Management: tylenol prn for headaches.  3. H/o depression/anxiety/claustophobia: Continue paxil. Provide ego support. Has a supportive family. Will monitor mood and have LCSW follow for evaluation.  4. Neuropsych: This patient is not capable of making decisions on her own behalf.  5. Leucocytosis: Sinusitis noted on Xrays. Will check UA/UCS. Treat if patient symptomatic.  6. ABLA: recheck in am. Will add iron supplement if lower.  7. Dizziness with head turns: ?vestibular component.  8. HTN: Will monitor with bid checks. Off HCTZ at this time. Continue lopressor bid.  9. Hypokalemia: will continue supplement thorough the weekend and check follow up labs on Monday.  10. Productive cough--- IS, OOB, mucinex  -low grade temp present, consider chest xray/abx if symptoms persist and progress     Length of stay, days: 1  Sonda Primes , MD 08/24/2012, 3:25 PM
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
## 2012-08-24 NOTE — Plan of Care (Signed)
Problem: RH BLADDER ELIMINATION Goal: RH STG MANAGE BLADDER WITH ASSISTANCE STG Manage Bladder With min Assistance  Outcome: Progressing Min to Mod assist to commode.  Problem: RH SAFETY Goal: RH STG ADHERE TO SAFETY PRECAUTIONS W/ASSISTANCE/DEVICE STG Adhere to Safety Precautions With min Assistance/Device.  Outcome: Not Progressing Pt. Needs close supervision, forgetful at times.

## 2012-08-25 ENCOUNTER — Inpatient Hospital Stay (HOSPITAL_COMMUNITY): Payer: Medicare Other | Admitting: Physical Therapy

## 2012-08-25 DIAGNOSIS — I609 Nontraumatic subarachnoid hemorrhage, unspecified: Secondary | ICD-10-CM

## 2012-08-25 DIAGNOSIS — R079 Chest pain, unspecified: Secondary | ICD-10-CM

## 2012-08-25 NOTE — Progress Notes (Signed)
Physical Therapy Session Note  Patient Details  Name: HAASINI PATNAUDE MRN: [id number] Date of Birth: 01-02-1930  Today's Date: 08/25/2012 Time: 1400-1446 Time Calculation (min): 46 min  Short Term Goals: Week 1:     Skilled Therapeutic Interventions/Progress Updates:  Pt was seen bedside in the pm with family at bedside. Pt transferred supine to edge of bed with side rails and supervision with increased time. Pt transferred sit to stand with min A x 1. Pt ambulated to bathroom with min Ax1 and verbal cues. Pt required min Ax1 and verbal cues when utilizing the bathroom. Pt ambulated with rolling walker 150 feet x3 with min Ax1 and verbal cues. Pt ambulated with handhold assist for 150 feet with min Ax1 and verbal cues. Pt transferred w/c to edge of bed with min Ax1 and edge of bed to supine with supervision. Pt presents with decreased safety awareness requiring multiple verbal cues throughout treatment session with inconsistent recall.    Therapy Documentation Precautions:  Precautions Precautions: Fall Precaution Comments: daughter reports no h/o imbalance PTA--unclear why she fell Restrictions Weight Bearing Restrictions: No General:  Pain: No c/o pain  Locomotion : Ambulation Ambulation/Gait Assistance: 4: Min assist   See FIM for current functional status  Therapy/Group: Individual Therapy  Rayford Halsted 08/25/2012, 4:04 PM

## 2012-08-25 NOTE — Progress Notes (Signed)
Theresa Lynch is a 79 y.o. female 1929/11/04 [id number]  Subjective: No new complaints. No new problems. Slept well. Feeling OK.  Objective: Vital signs in last 24 hours: Temp:  [98.1 F (36.7 C)-98.8 F (37.1 C)] 98.3 F (36.8 C) (08/03 0500) Pulse Rate:  [64-78] 65 (08/03 0500) Resp:  [18-19] 18 (08/03 0500) BP: (128-156)/(75-94) 149/89 mmHg (08/03 0500) SpO2:  [94 %-97 %] 97 % (08/03 0500) Weight change:  Last BM Date: 08/24/12  Intake/Output from previous day: 08/02 0701 - 08/03 0700 In: 340 [P.O.:340] Out: -  Last cbgs: CBG (last 3)  No results found for this basename: GLUCAP,  in the last 72 hours   Physical Exam General: No apparent distress    HEENT: moist mucosa Lungs: Normal effort. Lungs clear to auscultation, no crackles or wheezes. Cardiovascular: Regular rate and rhythm, no edema Musculoskeletal:  No change from before Neurological: No new neurological deficits Wounds: N/A    Skin: bruised face Alert, cooperative   Lab Results: BMET    Component Value Date/Time   NA 135 08/23/2012 0538   K 3.4* 08/23/2012 0538   CL 100 08/23/2012 0538   CO2 25 08/23/2012 0538   GLUCOSE 107* 08/23/2012 0538   BUN 13 08/23/2012 0538   CREATININE 0.72 08/23/2012 0538   CALCIUM 8.1* 08/23/2012 0538   GFRNONAA 78* 08/23/2012 0538   GFRAA >90 08/23/2012 0538   CBC    Component Value Date/Time   WBC 14.0* 08/22/2012 0521   RBC 3.26* 08/22/2012 0521   HGB 10.3* 08/22/2012 0521   HCT 29.4* 08/22/2012 0521   PLT 419* 08/22/2012 0521   MCV 90.2 08/22/2012 0521   MCH 31.6 08/22/2012 0521   MCHC 35.0 08/22/2012 0521   RDW 13.7 08/22/2012 0521   LYMPHSABS 2.4 08/21/2012 0550   MONOABS 1.7* 08/21/2012 0550   EOSABS 0.0 08/21/2012 0550   BASOSABS 0.0 08/21/2012 0550    Studies/Results: No results found.  Medications: I have reviewed the patient's current medications.  Assessment/Plan:  1. DVT Prophylaxis/Anticoagulation: Mechanical: Sequential compression devices, below knee Bilateral  lower extremities  2. Pain Management: tylenol prn for headaches.  3. H/o depression/anxiety/claustophobia: Continue paxil. Provide ego support. Has a supportive family. Will monitor mood and have LCSW follow for evaluation.  4. Neuropsych: This patient is not capable of making decisions on her own behalf.  5. Leucocytosis: Sinusitis noted on Xrays. Will check UA/UCS. Treat if patient symptomatic.  6. ABLA: recheck in am. Will add iron supplement if lower.  7. Dizziness with head turns: ?vestibular component.  8. HTN: Will monitor with bid checks. Off HCTZ at this time. Continue lopressor bid.  9. Hypokalemia: will continue supplement thorough the weekend and check follow up labs on Monday.  10. Productive cough--- IS, OOB, mucinex - better      Length of stay, days: 2  Sonda Primes , MD 08/25/2012, 10:36 AM

## 2012-08-26 ENCOUNTER — Encounter (HOSPITAL_COMMUNITY): Payer: Medicare Other

## 2012-08-26 ENCOUNTER — Inpatient Hospital Stay (HOSPITAL_COMMUNITY): Payer: Medicare Other | Admitting: Occupational Therapy

## 2012-08-26 ENCOUNTER — Inpatient Hospital Stay (HOSPITAL_COMMUNITY): Payer: Medicare Other

## 2012-08-26 ENCOUNTER — Inpatient Hospital Stay (HOSPITAL_COMMUNITY): Payer: Medicare Other | Admitting: Physical Therapy

## 2012-08-26 ENCOUNTER — Inpatient Hospital Stay (HOSPITAL_COMMUNITY): Payer: Medicare Other | Admitting: Speech Pathology

## 2012-08-26 LAB — COMPREHENSIVE METABOLIC PANEL
ALT: 12 U/L (ref 0–35)
AST: 12 U/L (ref 0–37)
Albumin: 2.6 g/dL — ABNORMAL LOW (ref 3.5–5.2)
CO2: 24 mEq/L (ref 19–32)
Calcium: 8.5 mg/dL (ref 8.4–10.5)
Chloride: 100 mEq/L (ref 96–112)
GFR calc non Af Amer: 76 mL/min — ABNORMAL LOW (ref >90)
Sodium: 134 mEq/L — ABNORMAL LOW (ref 135–145)
Total Bilirubin: 0.7 mg/dL (ref 0.3–1.2)

## 2012-08-26 LAB — CBC WITH DIFFERENTIAL/PLATELET
Basophils Absolute: 0.1 10*3/uL (ref 0.0–0.1)
Basophils Relative: 0 % (ref 0–1)
Lymphocytes Relative: 21 % (ref 12–46)
MCHC: 34.1 g/dL (ref 30.0–36.0)
Neutro Abs: 9.5 10*3/uL — ABNORMAL HIGH (ref 1.7–7.7)
Platelets: 557 10*3/uL — ABNORMAL HIGH (ref 150–400)
RDW: 14.2 % (ref 11.5–15.5)
WBC: 13.8 10*3/uL — ABNORMAL HIGH (ref 4.0–10.5)

## 2012-08-26 MED ORDER — HYDROCODONE-ACETAMINOPHEN 5-325 MG PO TABS
1.0000 | ORAL_TABLET | ORAL | Status: DC | PRN
  Administered 2012-08-26 – 2012-08-30 (×7): 1 via ORAL
  Filled 2012-08-26 (×7): qty 1

## 2012-08-26 MED ORDER — NYSTATIN 100000 UNIT/ML MT SUSP
5.0000 mL | Freq: Four times a day (QID) | OROMUCOSAL | Status: AC
  Administered 2012-08-26 – 2012-08-31 (×14): 500000 [IU] via ORAL
  Filled 2012-08-26 (×20): qty 5

## 2012-08-26 NOTE — Progress Notes (Signed)
Occupational Therapy Note  Patient Details  Name: CHARLETTE HENNINGS MRN: [id number] Date of Birth: [birth year] Today's Date: 08/26/2012  Time: 11-11:53am (53 min) Pt seen for 1:1 OT session. Upon arrival, pt supine in bed, already bathed and dressed for the day. Pt could not remember who helped her with bathing and dressing this morning. Pt could tell therapist why she was in the hospital. Pt c/o fatigue, no pain. Pt used RW to rehearse transfers onto commode and shower chair, also sofa in ADL apartment with verbal cues for hand placement. Pt ambulated with RW from room down to ADL apartment. Activity tolerance/dynamic standing activity in ADL kitchen with pt reaching into cabinets, although not interested in cooking anything today. Pt took 2 rest breaks, able to stand about at a time. Rehearsed side stepping with RW while in kitchen. Pt too fatigued to walk back to room, therapist pushed most of way, with pt walking with RW about 38ft at the end. Ended session with pt back in bed, bed alarm on, call bell in place.    Latoiya Maradiaga Hessie Diener 08/26/2012, 11:35 AM

## 2012-08-26 NOTE — Progress Notes (Signed)
Occupational Therapy Session Note  Patient Details  Name: Theresa Lynch MRN: [id number] Date of Birth: May 10, 1929  Today's Date: 08/26/2012 Time: 8295-6213 Time Calculation (min): 30 min  Short Term Goals: Week 1:  OT Short Term Goal 1 (Week 1): Pt. will maintain sustained attention during dressing enough to don shirt OT Short Term Goal 2 (Week 1): Pt. will don  shirt with SBA OT Short Term Goal 3 (Week 1): Pt. will dresss LB with SBA OT Short Term Goal 4 (Week 1): Pt will be oriented to time with calendar OT Short Term Goal 5 (Week 1): Pt. will transfer to toilet with SBA  Skilled Therapeutic Interventions/Progress Updates:  Patient resting in bed upon arrival with daughter and husband at her bedside.  Engaged in bed mobility, don shoes sitting EOB, ambulated to perform grooming tasks at sink, w/c><commode.  Focused session on activity tolerance, dynamic balance in sit and stand, safe transfers, and sustained attention.  Patient initially stated that she did not need to toilet however, once patient up for few minutes she initiated need to toilet and had continent episode of B & B.    Therapy Documentation Precautions:  Precautions Precautions: Fall Precaution Comments: daughter reports no h/o imbalance PTA--unclear why she fell Restrictions Weight Bearing Restrictions: No Pain: 3/10 right rib area, medication requested however, informed that patient had just received pain medication. See FIM for current functional status  Therapy/Group: Individual Therapy  Lasondra Hodgkins 08/26/2012, 4:54 PM

## 2012-08-26 NOTE — Progress Notes (Addendum)
Noted drainage on PICC dressing, site red, odor present. PAC aware, IVT notified.   1700; PICC d/c'd per IVT; culture tip sent.

## 2012-08-26 NOTE — Progress Notes (Signed)
Subjective/Complaints: A little restless last night. No other complaints.  A 12 point review of systems has been performed and if not noted above is otherwise negative.   Objective: Vital Signs: Blood pressure 146/92, pulse 68, temperature 98.1 F (36.7 C), temperature source Oral, resp. rate 18, height 5\' 6"  (1.676 m), weight 68.2 kg (150 lb 5.7 oz), SpO2 100.00%. No results found.  Recent Labs  08/26/12 0500  WBC 13.8*  HGB 10.0*  HCT 29.3*  PLT 557*    Recent Labs  08/26/12 0500  NA 134*  K 4.2  CL 100  GLUCOSE 105*  BUN 13  CREATININE 0.78  CALCIUM 8.5   CBG (last 3)  No results found for this basename: GLUCAP,  in the last 72 hours  Wt Readings from Last 3 Encounters:  08/23/12 68.2 kg (150 lb 5.7 oz)  08/20/12 67.5 kg (148 lb 13 oz)  08/14/12 64.9 kg (143 lb 1.3 oz)     CBG (last 3)  No results found for this basename: GLUCAP,  in the last 72 hours  Wt Readings from Last 3 Encounters:  08/23/12 68.2 kg (150 lb 5.7 oz)  08/20/12 67.5 kg (148 lb 13 oz)  08/14/12 64.9 kg (143 lb 1.3 oz)    Physical Exam:  Constitutional: She appears well-developed and well-nourished.  Right facial ecchymosis with racoon eyes and battle sign/ bandage over right eye. Nasal bridge with abrasions with ecchymosis. Right forehead laceration. All areas healing nicely.  HENT: THRUSH on tongue Head: Normocephalic.  Eyes: Pupils are equal, round, and reactive to light. Right eye exhibits discharge.  Neck: Normal range of motion. Neck supple.  Cardiovascular: Normal rate.  Pulmonary/Chest: Effort normal and breath sounds normal.  Abdominal: Soft. Bowel sounds are normal. She exhibits no distension. There is no tenderness.  Neurological: She is alert.  Very alert. Speech clear but slow with delayed processing persistent. Mild expressive deficits.   Has receptive language and processing deficits as well.. Delayed and often Unable to follow 2 step commands. perseveration with motor  (especially with LUE) and verbal apraxia noted. BLE with ataxic movements.  Skin: Skin is warm and dry. Large bruise right lateral breast area   Assessment/Plan: 1. 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 Physiatrist is providing close team supervision and 24 hour management of active medical problems listed below. Physiatrist and rehab team continue to assess barriers to discharge/monitor patient progress toward functional and medical goals. FIM: FIM - Bathing Bathing Steps Patient Completed: Chest;Right Arm;Left Arm;Abdomen;Right upper leg;Left upper leg;Front perineal area Bathing: 3: Mod-Patient completes 5-7 93f 10 parts or 50-74%  FIM - Upper Body Dressing/Undressing Upper body dressing/undressing steps patient completed: Thread/unthread right sleeve of pullover shirt/dresss;Put head through opening of pull over shirt/dress;Pull shirt over trunk Upper body dressing/undressing: 3: Mod-Patient completed 50-74% of tasks (Pt. needed assist to find correct arm hole) FIM - Lower Body Dressing/Undressing Lower body dressing/undressing steps patient completed: Pull pants up/down Lower body dressing/undressing: 2: Max-Patient completed 25-49% of tasks  FIM - Toileting Toileting steps completed by patient: Adjust clothing prior to toileting;Performs perineal hygiene Toileting: 3: Mod-Patient completed 2 of 3 steps  FIM - Diplomatic Services operational officer Devices: Grab bars Toilet Transfers: 3-To toilet/BSC: Mod A (lift or lower assist);3-From toilet/BSC: Mod A (lift or lower assist)  FIM - Bed/Chair Transfer Bed/Chair Transfer Assistive Devices: Bed rails Bed/Chair Transfer: 5:  Supine > Sit: Supervision (verbal cues/safety issues)  FIM - Locomotion: Ambulation Locomotion: Ambulation Assistive Devices: Walker - Rolling;Other  (comment) (hand hold assist) Ambulation/Gait Assistance: 4: Min assist Locomotion: Ambulation: 4: Travels 150 ft or more with minimal assistance (Pt.>75%)  Comprehension Comprehension Mode: Auditory Comprehension: 2-Understands basic 25 - 49% of the time/requires cueing 51 - 75% of the time  Expression Expression Mode: Verbal Expression: 3-Expresses basic 50 - 74% of the time/requires cueing 25 - 50% of the time. Needs to repeat parts of sentences.  Social Interaction Social Interaction: 2-Interacts appropriately 25 - 49% of time - Needs frequent redirection.  Problem Solving Problem Solving: 1-Solves basic less than 25% of the time - needs direction nearly all the time or does not effectively solve problems and may need a restraint for safety  Memory Memory: 2-Recognizes or recalls 25 - 49% of the time/requires cueing 51 - 75% of the time  Medical Problem List and Plan:  1. DVT Prophylaxis/Anticoagulation: Mechanical: Sequential compression devices, below knee Bilateral lower extremities  2. Pain Management: tylenol prn for headaches.  3. H/o depression/anxiety/claustophobia: Continue paxil. Provide ego support. Has a supportive family. Will monitor mood and have LCSW follow for evaluation.  4. Neuropsych: This patient is not capable of making decisions on her own behalf.  5. Leucocytosis: Sinusitis noted on Xrays. UA negative, ucx pending. Treat if patient symptomatic.  6. ABLA: hgb 10. Reactive increase in platetets 7. Dizziness  : ?vestibular component.  7. HTN: will monitor with bid checks. Continue HCTZ 9. Hypokalemia/FEN- continue current rx   LOS (Days) 3 A FACE TO FACE EVALUATION WAS PERFORMED  Adhira Jamil T 08/26/2012 10:19 AM

## 2012-08-26 NOTE — Progress Notes (Signed)
Occupational Therapy Note  Patient Details  Name: Theresa Lynch MRN: [id number] Date of Birth: 1929-07-01 Today's Date: 08/26/2012  Time In:  1300 Time Out:  1405.  No c/o pain.  Individual session.  Treatment focused on bed mobility, bed to wheelchair transfers, functional ambulation with and without a device, vestibular assessment, toilet transfers and hygiene, increasing activity tolerance, selective attention, following two step directions, balance retraining and family education.  Patient's daughter and husband present;  Reviewed impairments, focus of treatment, progress by patient and discharge planning. Patient fatigued but easily redirected to participate in therapy. Patient does display some mild visual vestibular impairment with rapid smooth pursuit and head turns in ambulation.  Appears to be resolving.   Would recommend working on rapid visual pursuits within limits of patient's cognition as well as balance and gait training without the walker focusing on uneven surfaces, head turns, stops and starts, turns and ambulation in busy environments.   Theresa Lynch 08/26/2012, 2:44 PM

## 2012-08-26 NOTE — Progress Notes (Signed)
Physical Therapy Session Note  Patient Details  Name: Theresa Lynch MRN: [id number] Date of Birth: 1929/06/22  Today's Date: 08/26/2012 Time: 5784-6962 Time Calculation (min): 44 min  Short Term Goals: Week 1:   STGs=LTGs  Skilled Therapeutic Interventions/Progress Updates:    1:1. Pt semi-reclined in bed upon entry, stating that she would like to eat breakfast (pt req S during eating). Pt encouraged to sit EOB to eat, pt able to t/f sup>sit EOB w/ (S). After food set-up, pt only proceeded to drink juice on breakfast tray and declined to eat any food, stating "I'm just not hungry." Pt found to be incontinent of urine. Pt able to amb 10'x2 from bed<>bathroom, pt able to manage clothing w/ verbal cues and min Ax1person for balance. Pt req mod-max Ax1person for changing shirt and pants while sitting EOB. Pt reluctant to further participate in therapy session due to fatigue from lack of sleep previous evening, however, agreeable with mod encouragement regarding benefits of therapy. Focus of functional mobility on endurance, environment/body awareness, attending to task as well as safety. Pt able to perform squat pivot t/f bed<>w/c w/ min A for stability. Pt able to propel manual w/c w/ B UE x175' w/ (S)-min A for obstacle negotiation. Pt able to amb 100' w/ RW and min A for stability. Pt demonstrates slow, but steady pace with step through pattern. Pt appears to "zone out" at times and inconsistently req repetition of 1-2 step simple commands.     Therapy Documentation Precautions:  Precautions Precautions: Fall Precaution Comments: daughter reports no h/o imbalance PTA--unclear why she fell Restrictions Weight Bearing Restrictions: No    Pain: Pain Assessment Pain Assessment: No/denies pain See FIM for current functional status  Therapy/Group: Individual Therapy  Denzil Hughes 08/26/2012, 9:17 AM

## 2012-08-26 NOTE — Progress Notes (Signed)
Speech Language Pathology Daily Session Note  Patient Details  Name: Theresa Lynch MRN: [id number] Date of Birth: 08-28-1929  Today's Date: 08/26/2012 Time: 1030-1045 Time Calculation (min): 15 min  Short Term Goals: Week 1: SLP Short Term Goal 1 (Week 1): Pt will demonstrate sustained attention to a fucntional task for 2 minutes with Max A verbal and tactile cues for redirection SLP Short Term Goal 2 (Week 1): Pt will initate functional tasks with Mod A verbal and tactile cues.  SLP Short Term Goal 3 (Week 1): Pt will utilize call bell to express wants/needs with Max A verbal and question cues. SLP Short Term Goal 4 (Week 1): Pt will identify 1 physical and 1 cognitive impairment with Max multimodal cueing SLP Short Term Goal 5 (Week 1): Pt will demonstrate functioanl problem solving for basic and familiar tasks with Mod A verbal cues.  SLP Short Term Goal 6 (Week 1): Pt will consume trials of regular textures and demonstrate efficient mastication without overt s/s of aspiraton   Skilled Therapeutic Interventions: Skilled treatment session focused on addressing cognitive recovery.  SLP facilitated session with Min verbal cues to sustain attention to topic of conversation; Mod cues to utilize external aids to assist with orientation and increased wait time to locate call bell and return demonstration. Session ended 15 minutes early due to patient's refusal to get up as a result of fatigue.    FIM:  Comprehension Comprehension: 4-Understands basic 75 - 89% of the time/requires cueing 10 - 24% of the time Expression Expression Mode: Verbal Expression: 4-Expresses basic 75 - 89% of the time/requires cueing 10 - 24% of the time. Needs helper to occlude trach/needs to repeat words. Social Interaction Social Interaction: 4-Interacts appropriately 75 - 89% of the time - Needs redirection for appropriate language or to initiate interaction. Problem Solving Problem Solving: 3-Solves basic 50 - 74%  of the time/requires cueing 25 - 49% of the time Memory Memory: 3-Recognizes or recalls 50 - 74% of the time/requires cueing 25 - 49% of the time  Pain Pain Assessment Pain Assessment: No/denies pain  Therapy/Group: Individual Therapy  Charlane Ferretti., CCC-SLP [phone]  [name] 08/26/2012, 10:57 AM

## 2012-08-26 NOTE — Progress Notes (Addendum)
Patient information reviewed and updated in eRehab system by Tora Duck, RN, CRRN, PPS Coordinator.  Information including medical coding and functional independence measure will be reviewed and updated through discharge. Patient per CMS criteria is considered an interrupted stay with her original admission date of 08/20/12 still active and her admission assessment period being 7/29, 8/1 and 8/2.  She was on acute services 08/21/12 and 08/22/12. Patient was given "Data Collection Information Summary for Patients in Inpatient Rehabilitation Facilities with attached "Privacy Act Statement-Health Care Records" upon original  Admission 08/20/12.  Billing department has been notified of interrupted stay status on 08/23/12.

## 2012-08-26 NOTE — Progress Notes (Signed)
Physical Therapy Note  Patient Details  Name: DUSTIE BRITTLE MRN: [id number] Date of Birth: 1930-01-14 Today's Date: 08/26/2012  1445-1525 (40 minutes) individual Pain: no complaint of pain Focus of treatment: gait training /endurance; bilateral LE strengthening in standing Treatment: Pt up in wc upon arrival; gait 80 feet RW min assist with minimal scanning side to side; standing x 10 bilateral hip flexion, hip abduction; up /down 4 inch step for quad strengthening; wc mobility -50 feet min assist with mod tactile cues for technique; sit to supine SBA; pt returned to bed with bed alarm activated.    Kindrick Lankford,JIM 08/26/2012, 2:53 PM

## 2012-08-27 ENCOUNTER — Inpatient Hospital Stay (HOSPITAL_COMMUNITY): Payer: Medicare Other | Admitting: Occupational Therapy

## 2012-08-27 ENCOUNTER — Encounter (HOSPITAL_COMMUNITY): Payer: Medicare Other | Admitting: Occupational Therapy

## 2012-08-27 ENCOUNTER — Inpatient Hospital Stay (HOSPITAL_COMMUNITY): Payer: Medicare Other

## 2012-08-27 ENCOUNTER — Inpatient Hospital Stay (HOSPITAL_COMMUNITY): Payer: Medicare Other | Admitting: Speech Pathology

## 2012-08-27 MED ORDER — MECLIZINE HCL 25 MG PO TABS
25.0000 mg | ORAL_TABLET | Freq: Every day | ORAL | Status: DC
  Administered 2012-08-28 – 2012-09-05 (×9): 25 mg via ORAL
  Filled 2012-08-27 (×13): qty 1

## 2012-08-27 NOTE — Progress Notes (Signed)
[facility] Individual Statement of Services  Patient Name:  OLIMPIA TINCH  Date:  08/27/2012  Welcome to the [facility]patient [facility].  Our goal is to provide you with an individualized program based on your diagnosis and situation, designed to meet your specific needs.  With this comprehensive rehabilitation program, you will be expected to participate in at least 3 hours of rehabilitation therapies Monday-Friday, with modified therapy programming on the weekends.  Your rehabilitation program will include the following services:  Physical Therapy (PT), Occupational Therapy (OT), Speech Therapy (ST), 24 hour per day rehabilitation nursing, Therapeutic Recreaction (TR), Neuropsychology, Case Management (Social Worker), Rehabilitation Medicine, Nutrition Services and Pharmacy Services  Weekly team conferences will be held on Tuesdays to discuss your progress.  Your Social Worker will talk with you frequently to get your input and to update you on team discussions.  Team conferences with you and your family in attendance may also be held.  Expected length of stay: 7-10 days  Overall anticipated outcome: supervision  Depending on your progress and recovery, your program may change. Your Social Worker will coordinate services and will keep you informed of any changes. Your Social Worker's name and contact numbers are listed  below.  The following services may also be recommended but are not provided by the Inpatient [facility]:   Driving Evaluations  Home Health Rehabiltiation Services  Outpatient Rehabilitation Services  Vocational Rehabilitation  Skilled Nursing/ Assisted Living Services   Arrangements will be made to provide these services after discharge if needed.  Arrangements include referral to agencies that provide these services.  Your insurance has been verified to be:  [place] Your primary doctor is:  Dr. Burnadette Pop  Pertinent  information will be shared with your doctor and your insurance company.  Social Worker:  [place], [place] [phone] or ([phone]   Information discussed with and copy given to patient by: Amada Jupiter, 08/27/2012, 11:30 AM

## 2012-08-27 NOTE — Progress Notes (Signed)
Speech Language Pathology Daily Session Note  Patient Details  Name: Theresa Lynch MRN: [id number] Date of Birth: 08-07-1929  Today's Date: 08/27/2012 Time: 0915-1000 Time Calculation (min): 45 min  Short Term Goals: Week 1: SLP Short Term Goal 1 (Week 1): Pt will demonstrate sustained attention to a fucntional task for 2 minutes with Max A verbal and tactile cues for redirection SLP Short Term Goal 2 (Week 1): Pt will initate functional tasks with Mod A verbal and tactile cues.  SLP Short Term Goal 3 (Week 1): Pt will utilize call bell to express wants/needs with Max A verbal and question cues. SLP Short Term Goal 4 (Week 1): Pt will identify 1 physical and 1 cognitive impairment with Max multimodal cueing SLP Short Term Goal 5 (Week 1): Pt will demonstrate functioanl problem solving for basic and familiar tasks with Mod A verbal cues.  SLP Short Term Goal 6 (Week 1): Pt will consume trials of regular textures and demonstrate efficient mastication without overt s/s of aspiraton   Skilled Therapeutic Interventions: Treatment focus on cognitive goals. SLP facilitated session by providing Max A verbal, visual and question cues for functional problem solving with sorting and counting money and Mod A verbal and visual cues for 4 step sequencing with picture cards. Pt demonstrated appropriate emergent awareness into difficulty of task. Pt also required Mod-Max A verbal cues for sustained attention to functional tasks. Pt independently requested medication due to nausea.    FIM:  Comprehension Comprehension Mode: Auditory Comprehension: 3-Understands basic 50 - 74% of the time/requires cueing 25 - 50%  of the time Expression Expression Mode: Verbal Expression: 4-Expresses basic 75 - 89% of the time/requires cueing 10 - 24% of the time. Needs helper to occlude trach/needs to repeat words. Social Interaction Social Interaction: 4-Interacts appropriately 75 - 89% of the time - Needs redirection for  appropriate language or to initiate interaction. Problem Solving Problem Solving: 2-Solves basic 25 - 49% of the time - needs direction more than half the time to initiate, plan or complete simple activities Memory Memory: 2-Recognizes or recalls 25 - 49% of the time/requires cueing 51 - 75% of the time  Pain Pain Assessment Pain Assessment: No/denies pain  Therapy/Group: Individual Therapy  Theresa Lynch 08/27/2012, 10:12 AM

## 2012-08-27 NOTE — Progress Notes (Signed)
Subjective/Complaints: No new complaints. Rested well.  A 12 point review of systems has been performed and if not noted above is otherwise negative.   Objective: Vital Signs: Blood pressure 103/68, pulse 85, temperature 97.6 F (36.4 C), temperature source Oral, resp. rate 18, height 5\' 6"  (1.676 m), weight 68.2 kg (150 lb 5.7 oz), SpO2 98.00%. No results found.  Recent Labs  08/26/12 0500  WBC 13.8*  HGB 10.0*  HCT 29.3*  PLT 557*    Recent Labs  08/26/12 0500  NA 134*  K 4.2  CL 100  GLUCOSE 105*  BUN 13  CREATININE 0.78  CALCIUM 8.5   CBG (last 3)  No results found for this basename: GLUCAP,  in the last 72 hours  Wt Readings from Last 3 Encounters:  08/23/12 68.2 kg (150 lb 5.7 oz)  08/20/12 67.5 kg (148 lb 13 oz)  08/14/12 64.9 kg (143 lb 1.3 oz)     CBG (last 3)  No results found for this basename: GLUCAP,  in the last 72 hours  Wt Readings from Last 3 Encounters:  08/23/12 68.2 kg (150 lb 5.7 oz)  08/20/12 67.5 kg (148 lb 13 oz)  08/14/12 64.9 kg (143 lb 1.3 oz)    Physical Exam:  Constitutional: She appears well-developed and well-nourished.  Right facial ecchymosis with racoon eyes and battle sign/ bandage over right eye. Nasal bridge with abrasions with ecchymosis. Right forehead laceration. All areas healing nicely.  HENT: THRUSH on tongue Head: Normocephalic.  Eyes: Pupils are equal, round, and reactive to light. Right eye exhibits discharge.  Neck: Normal range of motion. Neck supple.  Cardiovascular: Normal rate.  Pulmonary/Chest: Effort normal and breath sounds normal.  Abdominal: Soft. Bowel sounds are normal. She exhibits no distension. There is no tenderness.  Neurological: She is alert.  Very alert. Speech clear but slow with delayed processing persistent. Mild expressive deficits as well.   Has receptive language and processing deficits as well.. Delayed and often Unable to follow 2 step commands. perseveration with motor (especially  with LUE) and verbal apraxia noted. BLE with ataxic movements.  Skin: Skin is warm and dry. Large bruise right lateral breast area   Assessment/Plan: 1. 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 Physiatrist is providing close team supervision and 24 hour management of active medical problems listed below. Physiatrist and rehab team continue to assess barriers to discharge/monitor patient progress toward functional and medical goals. FIM: FIM - Bathing Bathing Steps Patient Completed: Chest;Right Arm;Left Arm;Abdomen;Right upper leg;Left upper leg;Front perineal area Bathing: 3: Mod-Patient completes 5-7 77f 10 parts or 50-74%  FIM - Upper Body Dressing/Undressing Upper body dressing/undressing steps patient completed: Thread/unthread right sleeve of pullover shirt/dresss;Put head through opening of pull over shirt/dress;Thread/unthread left sleeve of pullover shirt/dress Upper body dressing/undressing: 3: Mod-Patient completed 50-74% of tasks FIM - Lower Body Dressing/Undressing Lower body dressing/undressing steps patient completed: Pull pants up/down Lower body dressing/undressing: 2: Max-Patient completed 25-49% of tasks  FIM - Toileting Toileting steps completed by patient: Adjust clothing prior to toileting;Performs perineal hygiene;Adjust clothing after toileting Toileting Assistive Devices: Grab bar or rail for support Toileting: 4: Steadying assist  FIM - Diplomatic Services operational officer Devices: Best boy Transfers: 4-To toilet/BSC: Min A (steadying Pt. > 75%);4-From toilet/BSC: Min A (steadying Pt. > 75%)  FIM - Bed/Chair Transfer Bed/Chair Transfer Assistive Devices: Bed rails;HOB elevated Bed/Chair  Transfer: 5: Supine > Sit: Supervision (verbal cues/safety issues);5: Bed > Chair or W/C: Supervision  (verbal cues/safety issues);4: Bed > Chair or W/C: Min A (steadying Pt. > 75%);4: Chair or W/C > Bed: Min A (steadying Pt. > 75%)  FIM - Locomotion: Wheelchair Distance: 175' Locomotion: Wheelchair: 4: Travels 150 ft or more: maneuvers on rugs and over door sillls with minimal assistance (Pt.>75%) FIM - Locomotion: Ambulation Locomotion: Ambulation Assistive Devices: Walker - Rolling;Other (comment) Ambulation/Gait Assistance: 4: Min assist Locomotion: Ambulation: 2: Travels 50 - 149 ft with minimal assistance (Pt.>75%)  Comprehension Comprehension Mode: Auditory Comprehension: 4-Understands basic 75 - 89% of the time/requires cueing 10 - 24% of the time  Expression Expression Mode: Verbal Expression: 4-Expresses basic 75 - 89% of the time/requires cueing 10 - 24% of the time. Needs helper to occlude trach/needs to repeat words.  Social Interaction Social Interaction: 4-Interacts appropriately 75 - 89% of the time - Needs redirection for appropriate language or to initiate interaction.  Problem Solving Problem Solving: 3-Solves basic 50 - 74% of the time/requires cueing 25 - 49% of the time  Memory Memory: 3-Recognizes or recalls 50 - 74% of the time/requires cueing 25 - 49% of the time  Medical Problem List and Plan:  1. DVT Prophylaxis/Anticoagulation: Mechanical: Sequential compression devices, below knee Bilateral lower extremities  2. Pain Management: tylenol prn for headaches.  3. H/o depression/anxiety/claustophobia: Continue paxil. Provide ego support. Has a supportive family. Will monitor mood and have LCSW follow for evaluation.  4. Neuropsych: This patient is not capable of making decisions on her own behalf.  5. Leucocytosis: Sinusitis noted on Xrays. Check urine  -cath tip culture pending.  6. ABLA: hgb 10. Reactive increase in platetets 7. Dizziness  : ?vestibular component.  7. HTN: will monitor with bid checks. Continue HCTZ 9. Hypokalemia/FEN- continue current  rx 1   LOS (Days) 4 A FACE TO FACE EVALUATION WAS PERFORMED  Gal Feldhaus T 08/27/2012 9:38 AM

## 2012-08-27 NOTE — Progress Notes (Signed)
OccupationalTherapy Note  Patient Details  Name: Theresa Lynch MRN: [id number] Date of Birth: 19-Nov-1929 Today's Date: 08/27/2012  Pt missed 60 min of therapy reporting nausea and not feeling well and wanting to sleep. RN assisting pt back to bed. Pt declined to participate in any therapy.    Roney Mans [facility] 08/27/2012, 10:55 AM
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
## 2012-08-27 NOTE — Progress Notes (Signed)
Social Work  Social Work Assessment and Plan  Patient Details  Name: Theresa Lynch MRN: [id number] Date of Birth: September 04, 1929  Today's Date: 08/27/2012  Problem List:  Patient Active Problem List   Diagnosis Date Noted  . Leucocytosis 08/23/2012  . TBI (traumatic brain injury) 08/21/2012  . Hypokalemia 08/21/2012  . Hyponatremia 08/21/2012  . Syncope 08/21/2012  . Chest pain 08/21/2012  . Fall 08/18/2012  . Acute respiratory failure 08/18/2012  . Acute blood loss anemia 08/18/2012  . Intracerebral hemorrhage 08/15/2012  . Subarachnoid hemorrhage 08/15/2012  . Closed basilar skull fracture with subarachnoid hemorrhage 08/15/2012  . Closed fracture of facial bones 08/15/2012   Past Medical History:  Past Medical History  Diagnosis Date  . Glaucoma   . Depression     with anxiety/h/o agoraphobia  . Hyperlipidemia   . Arthritis   . PONV (postoperative nausea and vomiting)   . Claustrophobia   . HOH (hard of hearing)    Past Surgical History:  Past Surgical History  Procedure Laterality Date  . Rotator cuff repair  1999    bilateral   Social History:  reports that she has never smoked. She does not have any smokeless tobacco history on file. She reports that she drinks about 4.2 ounces of alcohol per week. She reports that she does not use illicit drugs.  Family / Support Systems Marital Status: Married How Long?: 62 yrs Patient Roles: Spouse;Parent Spouse/Significant Other: spouse, Theresa Lynch @ [phone] or (C) [phone] Children: daughter, Theresa Lynch @ (C) [phone];  son, Theresa Lynch Lobbyist) and son, Theresa Leriche ([place]) Anticipated Caregiver: spouse and daughter Ability/Limitations of Caregiver: Spouse with memory issues; Darl Pikes retired Engineer, structural Availability: 24/7 Family Dynamics: husband becomes tearful when making statement about his plans to "do whatever she needs from me".  Daughter describes a very close relationship with parents and siblings with all planning to  assist.  Social History Preferred language: English Religion: Unknown Cultural Background: NA Education: HS Read: Yes Write: Yes Employment Status: Retired Date Retired/Disabled/Unemployed: approx 10 yrs ago.  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  Struggles to answer questions and sometimes touches her head as if in pain.  Cognitive deficits interfereing with her ability to provide accurate information, therefore, all info cross-checked with family later.  She does confirm having a h/o depression and claustrophobic.  Denies any s/s of depression at this time- will monitor and also refer to neuropsych for eval. Recent Psychosocial Issues: None per family.  Pt and daughter both note pt's husband with "memory issues". Pyschiatric History: Daughter reports that pt has taken anti-depressants "for years".  Describes pt's noted claustrophobia as "mild". Substance Abuse History: NA  Patient / Family Perceptions, Expectations & Goals Pt/Family understanding of illness & functional limitations: Pt unable to report on her injuries/ reason for hospitallization.  States, "... the only think I know is I had some issue with my health... maybe I hurt my leg..."  Daughter and husband with basic understanding of pt's TBI and current functional and cognitive limitations.  Will need ongoing education and assistance  with problem solving / safety needs upon d/c. Premorbid pt/family roles/activities: Pt managed all financial  matters at home.  Sharing upkeep of home with husband.  Daughter notes, "She was always on the go..." Anticipated changes in roles/activities/participation: Pt will requrie a minimum of 24/7 supervision and will need someone else to manage finances.  It appears she may have been more of the "manager" of home and husband PTA, therefore, role shifts with husband and children becoming caregivers. Pt/family expectations/goals: "I just want her to get all the help she can and come home"  (per husband).  Daughter realistic that supervision is likely best level to be acheived.  Community Resources [employer]: None Premorbid Home Care/DME Agencies: None Transportation available at discharge: yes Resource referrals recommended: Neuropsychology;Support group (specify)  Discharge Planning Living Arrangements: Spouse/significant other Support Systems: Spouse/significant other;Children;Friends/neighbors Type of Residence: Private residence Insurance Resources: Medicare ((*AARP Medicare)) Financial Resources: Social Security Financial Screen Referred: No Living Expenses: Own Money Management: Patient Does the patient have any problems obtaining your medications?: No Home Management: shared between pt and husband Patient/Family Preliminary Plans: Plan for pt to d/c home with husband as primary support.  Daughter is recently retired and lives locally so she plans to spend the first week there 24/7 and then "... as much as they need me" Social Work Anticipated Follow Up Needs: HH/OP;Support Group Expected length of stay: ELOS 7 to 10 days  Clinical Impression Unfortunate, elderly woman here after a fall with TBI.  Significant cognitive impairments and really unable to complete a formal assessment, therefore, family providing information.  Supportive family including her husband (with noted memory issues per family) and a daughter who lives locally and recently retired - very willing to assist as  much as needed.  The daughter does mention possibility of SNF/ ALF d/c, however, hopes to be able to avoid this option and that pt can d/c directly home.  Will benefit from neuropsych intervention and ongoing TBI education.  Raeana Blinn 08/27/2012, 11:26 AM

## 2012-08-27 NOTE — Progress Notes (Signed)
Physical Therapy Session Note  Patient Details  Name: Theresa Lynch MRN: [id number] Date of Birth: [birth year]  Today's Date: 08/27/2012 Time: Treatment Session 1: 4098-1191; Treatment Session 2:  1300-1340 Time Calculation (min): Treatment Session 1: 47 min; Treatment Session 2:  Short Term Goals: Week 1:   LTGs=STGs  Skilled Therapeutic Interventions/Progress Updates:    Treatment Session 1: 1:1. Pt supine in bed upon entry sleeping, easy to wake. Pt slightly resistant to participation at first due to fatigue and want to sleep, pt agreeable after min encouragement regarding benefits of therapy. Therapist asked if pt needed to use the bathroom, but pt initially declined. However, once sitting EOB, pt then stating that she needed to use the bathroom. Pt able to amb 10' bed>bathroom w/ RW and min A. Pt req min-guard to min A for balance while managing clothing both up and down, pt req (S) for pericare. Pt able to amb 8' from bathroom>sink w/ RW and then stand at sink to brush teeth and comb hair w/ close (S). Pt able to propel manual w/c B UE x100' and then 98' w/ (S), pt demonstrates slow pace and req v/c for seq of B UE together. Upon standing prior to amb, pt appeared to be dizzy and req prompting to tell therapist. Pt stated that she gets slightly dizzy upon standing, but that it goes away. Pt educated that prior to amb, stand and wait for dizziness to subside before walking. Unable to obtain BP readings at this time to assess for orthostatic hypotension. Pt able to amb approx 50' in therapy apartment w/ RW and min A, pt also able to demonstrate ability to t/f sup<>sit in a standard bed w/ (S). Pt also demonstrated ability to negotiate up/down 4" curb step x2 w/ RW and min A. Overall good tolerance to tx session w/ focus on safe functional endurance, especially during standing mobility.   Treatment Session 2: 1:1. Pt supine in bed upon entry sleeping, easy to wake. Pt immediately stated that she  needed to use the bathroom. Pt able to amb x10' bed>bathroom w/ RW and min A, pt req min guard for managing clothing up and down. Pt req (S) for pericare. Pt able to amb 8' from bathroom>sink to wash hands w/ RW. Pt attempted to go back to bed, needed mod cues for redirection to participation in therapy. Pt able to amb 150' from room>therapy gym w/ RW and min A. Gait characterized by slight deviation from straight path and inattention to obstacles on L side. Pt performed obstacle course to challenge balance that included stepping over poles, walking around cones, negotiating up/down single 4" step and amb over a compliant surface. Attempted HHA, pt frequently reaching for rail in hall and very impulsive, req mod-max A for balance. Overall, pt req mod v/c + t/c to attend to obstacles and frequent redirection to activities. Pt benefits from step-by-step cues, especially of unfamiliar tasks and req repititon of simple commands approx 50% of time due to decreased ability to attend to tasks. Pt demonstrates increased willingness to participation when tasks are performed in context or functional in nature. Pt very fatigued at end of tx session and stated increased pain in R flank, RN aware.   Therapy Documentation Precautions:  Precautions Precautions: Fall Precaution Comments: daughter reports no h/o imbalance PTA--unclear why she fell Restrictions Weight Bearing Restrictions: No    Pain: Pain Assessment Pain Assessment: No/denies pain in AM; Pt reports have a lot of pain on R flank  in PM, RN made aware.  See FIM for current functional status  Therapy/Group: Individual Therapy  Denzil Hughes 08/27/2012, 1:44 PM

## 2012-08-27 NOTE — Progress Notes (Signed)
Occupational Therapy Session Note  Patient Details  Name: Theresa Lynch MRN: [id number] Date of Birth: Sep 24, 1929  Today's Date: 08/27/2012 Time: 1400-1430 Time Calculation (min): 30 min  Short Term Goals: Week 1:  OT Short Term Goal 1 (Week 1): Pt. will maintain sustained attention during dressing enough to don shirt OT Short Term Goal 2 (Week 1): Pt. will don  shirt with SBA OT Short Term Goal 3 (Week 1): Pt. will dresss LB with SBA OT Short Term Goal 4 (Week 1): Pt will be oriented to time with calendar OT Short Term Goal 5 (Week 1): Pt. will transfer to toilet with SBA  Skilled Therapeutic Interventions/Progress Updates:    1:1 participated in functional ambulation without AD to bathroom with max tactile cues for left inattention to environment. Pt heavily relies on right side to perform tasks. Engaged in toileting, and changing clothes (had been wearing current clothes since yesterday). Pt required max questioning cues to identify and correct mistake in dressing of missing dressing left arm requiring A to correct. Pt also participated in social conversation about her family and interests. Pt with no recall of therapy session earlier in day even with choices.   Therapy Documentation Precautions:  Precautions Precautions: Fall Precaution Comments: daughter reports no h/o imbalance PTA--unclear why she fell Restrictions Weight Bearing Restrictions: No    Pain: No c/o p ain  See FIM for current functional status  Therapy/Group: Individual Therapy  Roney Mans [facility] 08/27/2012, 2:34 PM

## 2012-08-27 NOTE — Progress Notes (Signed)
Speech Language Pathology Daily Session Note  Patient Details  Name: Theresa Lynch MRN: [id number] Date of Birth: 1929/04/26  Today's Date: 08/27/2012 Time: 1115-1200 Time Calculation (min): 45 min  Short Term Goals: Week 1: SLP Short Term Goal 1 (Week 1): Pt will demonstrate sustained attention to a fucntional task for 2 minutes with Max A verbal and tactile cues for redirection SLP Short Term Goal 2 (Week 1): Pt will initate functional tasks with Mod A verbal and tactile cues.  SLP Short Term Goal 3 (Week 1): Pt will utilize call bell to express wants/needs with Max A verbal and question cues. SLP Short Term Goal 4 (Week 1): Pt will identify 1 physical and 1 cognitive impairment with Max multimodal cueing SLP Short Term Goal 5 (Week 1): Pt will demonstrate functioanl problem solving for basic and familiar tasks with Mod A verbal cues.  SLP Short Term Goal 6 (Week 1): Pt will consume trials of regular textures and demonstrate efficient mastication without overt s/s of aspiraton   Skilled Therapeutic Interventions: Skilled treatment focused on cognitive goals. SLP facilitated beginning of session with Total A for safety awareness, as pt was trying to get OOB upon SLP arrival. Pt expressed her need for the bathroom with supervision level question cues. Pt was able to sustain attention to color matching task with Mod-Max cues for redirection to task. Pt required Total cues for intellectual awareness of impairments. Continue plan of care.   FIM:  Comprehension Comprehension Mode: Auditory Comprehension: 3-Understands basic 50 - 74% of the time/requires cueing 25 - 50%  of the time Expression Expression Mode: Verbal Expression: 4-Expresses basic 75 - 89% of the time/requires cueing 10 - 24% of the time. Needs helper to occlude trach/needs to repeat words. Social Interaction Social Interaction: 4-Interacts appropriately 75 - 89% of the time - Needs redirection for appropriate language or to  initiate interaction. Problem Solving Problem Solving: 2-Solves basic 25 - 49% of the time - needs direction more than half the time to initiate, plan or complete simple activities Memory Memory: 7-Complete Independence: No helper  Pain Pain Assessment Pain Assessment: No/denies pain  Therapy/Group: Individual Therapy  Maxcine Ham 08/27/2012, 1:27 PM  Maxcine Ham, M.A. CCC-SLP

## 2012-08-28 ENCOUNTER — Inpatient Hospital Stay (HOSPITAL_COMMUNITY): Payer: Medicare Other | Admitting: *Deleted

## 2012-08-28 ENCOUNTER — Encounter (HOSPITAL_COMMUNITY): Payer: Medicare Other | Admitting: Occupational Therapy

## 2012-08-28 ENCOUNTER — Inpatient Hospital Stay (HOSPITAL_COMMUNITY): Payer: Medicare Other

## 2012-08-28 ENCOUNTER — Inpatient Hospital Stay (HOSPITAL_COMMUNITY): Payer: Medicare Other | Admitting: Occupational Therapy

## 2012-08-28 ENCOUNTER — Encounter (HOSPITAL_COMMUNITY): Payer: Medicare Other

## 2012-08-28 ENCOUNTER — Inpatient Hospital Stay (HOSPITAL_COMMUNITY): Payer: Medicare Other | Admitting: Speech Pathology

## 2012-08-28 DIAGNOSIS — W19XXXA Unspecified fall, initial encounter: Secondary | ICD-10-CM

## 2012-08-28 DIAGNOSIS — S069X9A Unspecified intracranial injury with loss of consciousness of unspecified duration, initial encounter: Secondary | ICD-10-CM

## 2012-08-28 DIAGNOSIS — E876 Hypokalemia: Secondary | ICD-10-CM

## 2012-08-28 MED ORDER — TRAZODONE HCL 50 MG PO TABS
50.0000 mg | ORAL_TABLET | Freq: Every day | ORAL | Status: DC
  Administered 2012-08-28 – 2012-09-04 (×8): 50 mg via ORAL
  Filled 2012-08-28 (×8): qty 1

## 2012-08-28 NOTE — Progress Notes (Signed)
Physical Therapy Session Note  Patient Details  Name: Theresa Lynch MRN: [id number] Date of Birth: 09-04-1929  Today's Date: 08/28/2012 Time: Treatment Session [id number]; Treatment Session 2: 1500-1520 Time Calculation (min): Treatment Session 1:55 min; Treatment Session 2:   Short Term Goals: Week 1:   LTGs=STGs  Skilled Therapeutic Interventions/Progress Updates:  Treatment Session 1: Co-tx first of tx session with Rec Therapy. 1:1 for remainder of tx session. Focus this tx on assessment of dynamic standing balance as well as gait training in community environments. Pt amb 250'x3 w/ HHA and mod Ax1person and second person for w/c follow. Pt's amb challenged in inside and outside community environments over various surfaces and req negotiation of stairs. During amb, pt engaged in conversation by both Physical and Rec Thearpists to challenge pt's ability to divide attention. Pt consistently demonstrated increased speed of amb, improved balance as well as decreased need for physical assist when not distracted, however, when engaged in conversation and/or in busy environment pt demonstrating small, but frequent LOBs, req up to mod A x1person for correction. Gait characterized by narrow BOS, deviation from straight path, but even step lengths. Pt also able to negotiate up/down 6, 6" steps w/ use of single rail as well as HHA/mod Ax1person with reciprocal pattern. Berg Balance Test performed at end of tx session to assess pt's dynamic standing balance. Pt received score of 32/56 and was educated on results. Pt with only verbalization of increased dizziness during tx session upon completion of 360deg turn during Test. After completion of 360deg turn, pt w/ poor attention to remaining balance items and req increased v/c + t/c for following directions.   Treatment Session 2: 1:1. Pt's daughter Darl Pikes) and husband [name]) present in room upon entry, pt semi-reclined in bed talking with family.  Family educated at length regarding functional mobility progress to date, especially current balance deficits. Pt's daughter verbalized understanding and also provided knowledge that pt does not have any previous vestibular issues. Pt stating that she was worried that she was not currently taking all medications she normally does at home, pt's daughter emphasized that all home medications were passed along to the pharmacist and therapist educated pt that RN can further go over medications with pt. RN aware. Pt continues to demonstrate decreased insight regarding deficits as she was not receptive to daughter initiating extra help for her when she goes home. Pt's family also briefly educated regarding pt's swallow/diet precautions and encouraged to follow up with speech therapist as they were stating they wanted to bring her in a hamburger. Family verbalized understanding. Pt adamantly declined participation in therapy after family left due to fatigue.   Therapy Documentation Precautions:  Precautions Precautions: Fall Precaution Comments: daughter reports no h/o imbalance PTA--unclear why she fell Restrictions Weight Bearing Restrictions: No    Pain: Pain Assessment Pain Assessment: No/denies pain     Balance: Balance Balance Assessed: Yes Standardized Balance Assessment Standardized Balance Assessment: Berg Balance Test (32/56) Berg Balance Test Sit to Stand: Able to stand without using hands and stabilize independently Standing Unsupported: Able to stand 2 minutes with supervision Sitting with Back Unsupported but Feet Supported on Floor or Stool: Able to sit safely and securely 2 minutes Stand to Sit: Sits safely with minimal use of hands Transfers: Able to transfer with verbal cueing and /or supervision Standing Unsupported with Eyes Closed: Able to stand 10 seconds with supervision Standing Ubsupported with Feet Together: Able to place feet together independently and stand for 1  minute with supervision From Standing, Reach Forward with Outstretched Arm: Loses balance while trying/requires external support From Standing Position, Pick up Object from Floor: Able to pick up shoe, needs supervision From Standing Position, Turn to Look Behind Over each Shoulder: Looks behind from both sides and weight shifts well Turn 360 Degrees: Needs close supervision or verbal cueing Standing Unsupported, One Foot in Front: Needs help to step but can hold 15 seconds Standing on One Leg: Unable to try or needs assist to prevent fall  See FIM for current functional status  Therapy/Group: Individual Therapy and Co-Treatment  Denzil Hughes 08/28/2012, 9:31 AM

## 2012-08-28 NOTE — Patient Care Conference (Signed)
Inpatient RehabilitationTeam Conference and Plan of Care Update Date: 08/27/2012   Time: 2:45 PM    Patient Name: Theresa Lynch      Medical Record Number: [id number]  Date of Birth: [birth year] Sex: Female         Room/Bed: 4W22C/4W22C-01 Payor Info: Payor: Advertising copywriter MEDICARE / Plan: [employer] MEDICARE COMPLETE / Product Type: *No Product type* /    Admitting Diagnosis: TBI  Admit Date/Time:  08/23/2012  4:19 PM Admission Comments: No comment available   Primary Diagnosis:  TBI (traumatic brain injury) Principal Problem: TBI (traumatic brain injury)  Patient Active Problem List   Diagnosis Date Noted  . Leucocytosis 08/23/2012  . TBI (traumatic brain injury) 08/21/2012  . Hypokalemia 08/21/2012  . Hyponatremia 08/21/2012  . Syncope 08/21/2012  . Chest pain 08/21/2012  . Fall 08/18/2012  . Acute respiratory failure 08/18/2012  . Acute blood loss anemia 08/18/2012  . Intracerebral hemorrhage 08/15/2012  . Subarachnoid hemorrhage 08/15/2012  . Closed basilar skull fracture with subarachnoid hemorrhage 08/15/2012  . Closed fracture of facial bones 08/15/2012    Expected Discharge Date: Expected Discharge Date: 09/05/12  Team Members Present: Physician leading conference: Dr. Faith Rogue Social Worker Present: Amada Jupiter, LCSW Nurse Present: Carlean Purl, RN PT Present: Zerita Boers, PT OT Present: Mackie Pai, Marye Round, OT;Ardis Rowan, COTA SLP Present: Feliberto Gottron, SLP     Current Status/Progress Goal Weekly Team Focus  Medical   TBi with neurocognitive, language deficits, vestibular deficits  stabilize medically for dc  vestibular deficits, pain   Bowel/Bladder   pt continent of B/B.  will remain continent of B/B.  Continent of B/B toilet as needed.   Swallow/Nutrition/ Hydration   Dys. 3 textures with thin liquids, Supervision  Supervision with least restrictive diet  trials of regular textures   ADL's   min  A overall   supervision overall    intellecual awareness, left attention, simple problem solving, attention   Mobility   Min A for squat pivot t/fs, amb w/ RW and w/c propulsion. (S) for bed mob.   (S) for dynamic sit/stand balance, bed mob, bed<>chair and car t/fs, amb x150' w/ RW in home and community environments, stair negotiation.   Attending to tasks, safety, functional endurance especially during standing functional mobility.    Communication             Safety/Cognition/ Behavioral Observations  Mod-Max A  Min A  attention, awareness, problem solving   Pain   pt has vicodin 5/325mg  one po q 4 hrs c/o pain. Headache or left side of rib cage.  less than or equal to 3.  will continue to monitor assess for pain q shift medicate as needed.    Skin   pt with bruising to right side of face and bilateral arms.   remain free of skin breakdown/infection.   will continue to monitor and assess q shift.     Rehab Goals Patient on target to meet rehab goals: Yes *See Care Plan and progress notes for long and short-term goals.  Barriers to Discharge: cognitive language issues, dizziness    Possible Resolutions to Barriers:  NMR, cogniitive remediation,     Discharge Planning/Teaching Needs:  Home with spouse and daughter to provide 24/7 supervision      Team Discussion:  Much better with this readmission to CIR.  Medically stable.  Decreased balance, attention and cognition.  Vestibular issues?  Making good gains with cognition and with supervision goals overall  Revisions  to Treatment Plan:  Recommend neuropsych evaluation.   Continued Need for Acute Rehabilitation Level of Care: The patient requires daily medical management by a physician with specialized training in physical medicine and rehabilitation for the following conditions: Daily direction of a multidisciplinary physical rehabilitation program to ensure safe treatment while eliciting the highest outcome that is of practical value to the patient.: Yes Daily  medical management of patient stability for increased activity during participation in an intensive rehabilitation regime.: Yes Daily analysis of laboratory values and/or radiology reports with any subsequent need for medication adjustment of medical intervention for : Neurological problems;Post surgical problems;Cardiac problems  Bennette Hasty 08/28/2012, 9:41 AM

## 2012-08-28 NOTE — Progress Notes (Signed)
Subjective/Complaints: Had problems getting to sleep, would like sleep medication scheduled  A 12 point review of systems has been performed and if not noted above is otherwise negative.   Objective: Vital Signs: Blood pressure 147/69, pulse 91, temperature 98 F (36.7 C), temperature source Oral, resp. rate 19, height 5\' 6"  (1.676 m), weight 68.2 kg (150 lb 5.7 oz), SpO2 96.00%. No results found.  Recent Labs  08/26/12 0500  WBC 13.8*  HGB 10.0*  HCT 29.3*  PLT 557*    Recent Labs  08/26/12 0500  NA 134*  K 4.2  CL 100  GLUCOSE 105*  BUN 13  CREATININE 0.78  CALCIUM 8.5   CBG (last 3)  No results found for this basename: GLUCAP,  in the last 72 hours  Wt Readings from Last 3 Encounters:  08/23/12 68.2 kg (150 lb 5.7 oz)  08/20/12 67.5 kg (148 lb 13 oz)  08/14/12 64.9 kg (143 lb 1.3 oz)     CBG (last 3)  No results found for this basename: GLUCAP,  in the last 72 hours  Wt Readings from Last 3 Encounters:  08/23/12 68.2 kg (150 lb 5.7 oz)  08/20/12 67.5 kg (148 lb 13 oz)  08/14/12 64.9 kg (143 lb 1.3 oz)    Physical Exam:  Constitutional: She appears well-developed and well-nourished.  Right facial ecchymoses, wounds all showing improvement. Nasal bridge with abrasions with ecchymosis. Right forehead laceration. All areas healing nicely.  HENT: THRUSH on tongue Head: Normocephalic.  Eyes: Pupils are equal, round, and reactive to light. Right eye exhibits discharge.  Neck: Normal range of motion. Neck supple.  Cardiovascular: Normal rate.  Pulmonary/Chest: Effort normal and breath sounds normal.  Abdominal: Soft. Bowel sounds are normal. She exhibits no distension. There is no tenderness.  Neurological: She is alert.  Very alert. Speech clear but slow with delayed processing persistent. Mild expressive deficits as well.   Has receptive language and processing deficits as well.. Delayed and often Unable to follow 2 step commands. perseveration with motor  (especially with LUE) and verbal apraxia noted. BLE with ataxic movements.  Skin: Skin is warm and dry. Large bruise right lateral breast area   Assessment/Plan: 1. 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 Physiatrist is providing close team supervision and 24 hour management of active medical problems listed below. Physiatrist and rehab team continue to assess barriers to discharge/monitor patient progress toward functional and medical goals. FIM: FIM - Bathing Bathing Steps Patient Completed: Chest;Right Arm;Left Arm;Abdomen;Right upper leg;Left upper leg;Front perineal area Bathing: 3: Mod-Patient completes 5-7 6f 10 parts or 50-74%  FIM - Upper Body Dressing/Undressing Upper body dressing/undressing steps patient completed: Thread/unthread right sleeve of pullover shirt/dresss;Put head through opening of pull over shirt/dress;Thread/unthread left sleeve of pullover shirt/dress Upper body dressing/undressing: 3: Mod-Patient completed 50-74% of tasks FIM - Lower Body Dressing/Undressing Lower body dressing/undressing steps patient completed: Pull pants up/down Lower body dressing/undressing: 2: Max-Patient completed 25-49% of tasks  FIM - Toileting Toileting steps completed by patient: Adjust clothing prior to toileting;Performs perineal hygiene;Adjust clothing after toileting Toileting Assistive Devices: Grab bar or rail for support Toileting: 4: Steadying assist  FIM - Diplomatic Services operational officer Devices: Best boy Transfers: 4-To toilet/BSC: Min A (steadying Pt. > 75%);4-From toilet/BSC: Min A (steadying Pt. > 75%)  FIM - Bed/Chair Transfer Bed/Chair Transfer Assistive Devices: Arm rests;Walker Bed/Chair Transfer: 5:  Supine > Sit: Supervision (verbal cues/safety issues);5: Bed > Chair or W/C: Supervision  (verbal cues/safety issues);4: Bed > Chair or W/C: Min A (steadying Pt. > 75%);4: Chair or W/C > Bed: Min A (steadying Pt. > 75%)  FIM - Locomotion: Wheelchair Distance: 100', 50' Locomotion: Wheelchair: 2: Travels 50 - 149 ft with supervision, cueing or coaxing FIM - Locomotion: Ambulation Locomotion: Ambulation Assistive Devices: Walker - Rolling;Other (comment) Ambulation/Gait Assistance: 4: Min assist Locomotion: Ambulation: 2: Travels 50 - 149 ft with minimal assistance (Pt.>75%)  Comprehension Comprehension Mode: Auditory Comprehension: 3-Understands basic 50 - 74% of the time/requires cueing 25 - 50%  of the time  Expression Expression Mode: Verbal Expression: 4-Expresses basic 75 - 89% of the time/requires cueing 10 - 24% of the time. Needs helper to occlude trach/needs to repeat words.  Social Interaction Social Interaction: 4-Interacts appropriately 75 - 89% of the time - Needs redirection for appropriate language or to initiate interaction.  Problem Solving Problem Solving: 2-Solves basic 25 - 49% of the time - needs direction more than half the time to initiate, plan or complete simple activities  Memory Memory: 4-Recognizes or recalls 75 - 89% of the time/requires cueing 10 - 24% of the time  Medical Problem List and Plan:  1. DVT Prophylaxis/Anticoagulation: Mechanical: Sequential compression devices, below knee Bilateral lower extremities  2. Pain Management: tylenol prn for headaches.  3. H/o depression/anxiety/claustophobia: Continue paxil. Provide ego support. Has a supportive family. Will monitor mood and have LCSW follow for evaluation.  4. Neuropsych: This patient is not capable of making decisions on her own behalf.  5. Leucocytosis: Sinusitis noted on Xrays. Urine cx pending  -cath tip culture pending.  6. ABLA: hgb 10. Reactive increase in platetets 7. Dizziness  : ?vestibular component. Therapy attempting to work with and through 7. HTN: will monitor with  bid checks. Continue HCTZ 9. Hypokalemia/FEN- continue current rx 10. Insomnia: schedule trazodone qhs   LOS (Days) 5 A FACE TO FACE EVALUATION WAS PERFORMED  Anela Bensman T 08/28/2012 9:50 AM

## 2012-08-28 NOTE — Progress Notes (Signed)
Speech Language Pathology Daily Session Note  Patient Details  Name: Theresa Lynch MRN: [id number] Date of Birth: 1929-10-14  Today's Date: 08/28/2012 Time: 1137-1220 Time Calculation (min): 43 min  Short Term Goals: Week 1: SLP Short Term Goal 1 (Week 1): Pt will demonstrate sustained attention to a fucntional task for 2 minutes with Max A verbal and tactile cues for redirection SLP Short Term Goal 2 (Week 1): Pt will initate functional tasks with Mod A verbal and tactile cues.  SLP Short Term Goal 3 (Week 1): Pt will utilize call bell to express wants/needs with Max A verbal and question cues. SLP Short Term Goal 4 (Week 1): Pt will identify 1 physical and 1 cognitive impairment with Max multimodal cueing SLP Short Term Goal 5 (Week 1): Pt will demonstrate functioanl problem solving for basic and familiar tasks with Mod A verbal cues.  SLP Short Term Goal 6 (Week 1): Pt will consume trials of regular textures and demonstrate efficient mastication without overt s/s of aspiraton   Skilled Therapeutic Interventions: Treatment focus on dysphagia and cognitive goals. SLP facilitated session by providing supervision verbal cues for initiation of self-feeding task. Pt consumed Dys. 3 textures with thin liquids without overt s/s of aspiration and was Mod I for utilization of swallowing compensatory strategies. Pt also consumed trials of regular textures without overt s/s of aspiration and demonstrated efficient mastication. Recommend diet upgrade to regular textures.  Pt also participated in language task that focused on description and word-finding. Pt required Mod A verbal, question and semantic cues to self-monitor and correct errors throughout the task. Difficult to differentiate if language errors are due to impaired cognition or true language deficits.  Pt attempted to ambulate back to room with hand held assist, however, pt became dizzy and needed to sit. Pt transferred to her wheelchair and then  back to bed.    FIM:  Comprehension Comprehension Mode: Auditory Comprehension: 3-Understands basic 50 - 74% of the time/requires cueing 25 - 50%  of the time Expression Expression Mode: Verbal Expression: 4-Expresses basic 75 - 89% of the time/requires cueing 10 - 24% of the time. Needs helper to occlude trach/needs to repeat words. Social Interaction Social Interaction: 4-Interacts appropriately 75 - 89% of the time - Needs redirection for appropriate language or to initiate interaction. Problem Solving Problem Solving: 2-Solves basic 25 - 49% of the time - needs direction more than half the time to initiate, plan or complete simple activities Memory Memory: 2-Recognizes or recalls 25 - 49% of the time/requires cueing 51 - 75% of the time FIM - Eating Eating Activity: 6: Swallowing techniques: self-managed  Pain Pain Assessment Pain Assessment: No/denies pain  Therapy/Group: Individual Therapy  Trentan Trippe 08/28/2012, 1:17 PM

## 2012-08-28 NOTE — Progress Notes (Signed)
Social Work Patient ID: Theresa Lynch, female   DOB: 02/02/1929, 79 y.o.   MRN: [id number]   Met yesterday with patient, husband and daughter to review team conference.  All aware and agreeable with targeted d/c date of 09/05/12.  Pt appear with brighter affect and attention than earlier in the day.  Family pleased with overall cognitive improvement and daughter in process of hiring some private duty relief assistance for husband at home.  Continue to follow for support and d/c planning.  Darsi Tien, LCSW

## 2012-08-28 NOTE — Progress Notes (Signed)
Occupational Therapy Session Note  Patient Details  Name: Theresa Lynch MRN: [id number] Date of Birth: 02-Jul-1929  Today's Date: 08/28/2012 Time: 1300-1400 Time Calculation (min): 60 min  Short Term Goals: Week 1:  OT Short Term Goal 1 (Week 1): Pt. will maintain sustained attention during dressing enough to don shirt OT Short Term Goal 2 (Week 1): Pt. will don  shirt with SBA OT Short Term Goal 3 (Week 1): Pt. will dresss LB with SBA OT Short Term Goal 4 (Week 1): Pt will be oriented to time with calendar OT Short Term Goal 5 (Week 1): Pt. will transfer to toilet with SBA  Skilled Therapeutic Interventions/Progress Updates:    1:1 Engaged pt in kitchen task of making cookies from refrigerated dough- pt required total A to organize and carry out one step commands. Pt required max A to locate functional items on the left ie the refrigerator. Pt demonstrated mod to max A for sustained attention to task. Functional ambulation without AD with min A and min to mod for head turns- pt continues to loose her balance and report dizziness with quick head turns- requiring a seated rest break. Participated in sequencing 4 cards in order (2 sets). Pt would get the first and last one correct but required max A to problem solve the middle two. Also participated in a card "Odd one out" naming the item that doesn't belong and why out of field of 4 - requiring min cuing for the why due to word finding- did have minimal awareness of difficulty expressing self. Pt with increased wording finding difficulty with increased fatigue this pm.   Therapy Documentation Precautions:  Precautions Precautions: Fall Precaution Comments: daughter reports no h/o imbalance PTA--unclear why she fell Restrictions Weight Bearing Restrictions: No Pain: Pain Assessment Pain Assessment: No/denies pain  See FIM for current functional status  Therapy/Group: Individual Therapy  Roney Mans [facility] 08/28/2012, 2:51 PM

## 2012-08-28 NOTE — Progress Notes (Signed)
Occupational Therapy Session Note  Patient Details  Name: Theresa Lynch MRN: [id number] Date of Birth: 1929/12/04  Today's Date: 08/28/2012 Time: 1100-1137 Time Calculation (min): 37 min  Short Term Goals: Week 1:  OT Short Term Goal 1 (Week 1): Pt. will maintain sustained attention during dressing enough to don shirt OT Short Term Goal 2 (Week 1): Pt. will don  shirt with SBA OT Short Term Goal 3 (Week 1): Pt. will dresss LB with SBA OT Short Term Goal 4 (Week 1): Pt will be oriented to time with calendar OT Short Term Goal 5 (Week 1): Pt. will transfer to toilet with SBA  Skilled Therapeutic Interventions/Progress Updates:    1:1 self care retraining at shower level with focus on functional ambulation around room without AD with focus on left body and environment awareness and attention, simple problem solving, intellectual awareness, following one step commands. After showering pt sat in chair next to dresser with dresser on her left to promote attention to left. Pt required max A to pick out clothing items one at a time. Pt would become distracted by other items and presented disorganized requiring A to organize self. Pt with no awareness of mistakes in dressing (donning clothing backwards or missing dressing her left UE. Pt ambulated with HHA down to dayroom to engage in eating lunch with SLP. Pt required mod A for body awareness with turning through doorways to avoid running into them on the left.  Therapy Documentation Precautions:  Precautions Precautions: Fall Precaution Comments: daughter reports no h/o imbalance PTA--unclear why she fell Restrictions Weight Bearing Restrictions: No Pain: Pain Assessment Pain Assessment: No/denies pain  See FIM for current functional status  Therapy/Group: Individual Therapy  Roney Mans [facility] 08/28/2012, 12:09 PM

## 2012-08-28 NOTE — Progress Notes (Signed)
Recreational Therapy Session Note  Patient Details  Name: Theresa Lynch MRN: [id number] Date of Birth: [birth year] Today's Date: 08/28/2012 Time:  835-905 Pain: no c/o pain Skilled Therapeutic Interventions/Progress Updates: Session focused on activity tolerance, community ambulation with HHA on even/uneven indoor/outdoor surfaces with min- mod assist.  Pt ambulated >1000 feet with 2 seated rest breaks.    Therapy/Group: [employer]   Adely Facer 08/28/2012, 12:08 PM

## 2012-08-29 ENCOUNTER — Encounter (HOSPITAL_COMMUNITY): Payer: Medicare Other | Admitting: Occupational Therapy

## 2012-08-29 ENCOUNTER — Inpatient Hospital Stay (HOSPITAL_COMMUNITY): Payer: Medicare Other | Admitting: Speech Pathology

## 2012-08-29 ENCOUNTER — Inpatient Hospital Stay (HOSPITAL_COMMUNITY): Payer: Medicare Other | Admitting: *Deleted

## 2012-08-29 ENCOUNTER — Inpatient Hospital Stay (HOSPITAL_COMMUNITY): Payer: Medicare Other | Admitting: Occupational Therapy

## 2012-08-29 ENCOUNTER — Inpatient Hospital Stay (HOSPITAL_COMMUNITY): Payer: Medicare Other

## 2012-08-29 MED ORDER — ALPRAZOLAM 0.25 MG PO TABS
0.2500 mg | ORAL_TABLET | Freq: Two times a day (BID) | ORAL | Status: DC | PRN
  Administered 2012-08-31 – 2012-09-01 (×3): 0.25 mg via ORAL
  Filled 2012-08-29 (×3): qty 1

## 2012-08-29 MED ORDER — PAROXETINE HCL 30 MG PO TABS
60.0000 mg | ORAL_TABLET | Freq: Every day | ORAL | Status: DC
  Administered 2012-08-29: 40 mg via ORAL
  Administered 2012-08-30 – 2012-09-04 (×6): 60 mg via ORAL
  Filled 2012-08-29 (×8): qty 2

## 2012-08-29 NOTE — Progress Notes (Signed)
Recreational Therapy Session Note  Patient Details  Name: Theresa Lynch MRN: [id number] Date of Birth: [birth year] Today's Date: 08/29/2012 Time:  915-10 Pain: no c/o Skilled Therapeutic Interventions/Progress Updates: Session focused on activity tolerance, dynamic standing balance during ball toss activity.  Pt sat in chair while tossing/catching ball with contact guard assist.  Challenge increased by   increasing speed of passing ball, having target(clinician) for passing/catching the ball move.  Pt demonstrated slight decreased coordination of catching ball, pt without reports of increased dizziness. In standing pt performed various ball toss activities with min-max A for balance as needed. Activities included: pt passing ball with clinician in static standing, while moving forward/backward as well as while turing both clockwise and counterclockwise. Pt noted with increased dizziness specifically w/ rotational movements and minor dizziness when walking backwards, increased LOB noted during these movements as well. Pt educated to avoid quick movements to decrease chance of LOB. No AD used during this tx session, HHA provided by PTat times.   Therapy/Group: Co-Treatment   Sami Froh 08/29/2012, 4:16 PM

## 2012-08-29 NOTE — Progress Notes (Signed)
Physical Therapy Session Note  Patient Details  Name: Theresa Lynch MRN: [id number] Date of Birth: 13-Jul-1929  Today's Date: 08/29/2012 Time: Treatment Session 1: 4098-1191 Treatment Session 2:1400-1440  Time Calculation (min): Treatment Session 1:42 min; Treatment Session 2:   Short Term Goals: Week 1:   LTGs=STGs  Skilled Therapeutic Interventions/Progress Updates:    Treatment Session 1: Co-tx this session w/ Rec Therapy and focus on dynamic sitting/standing balance to assess balance reactions as well as monitor of specific movements that increase dizziness. Pt sitting in chair initial passing small therapy ball with rec therapist as physical therapist provided min-guard assist. Other sitting challenges included, increasing speed of passing ball, having rec therapist move while passing ball to pain and having pt pass to both therapists as their positions varied. Pt demonstrated slight decreased coordination of catching ball and pt w/ no reports of increased dizziness. In standing pt performed various ball toss activities with rec therapist as physical therapist provided min-max A for balance as needed. Activities included: pt passing ball with rec therapist in static standing, while moving forward/backward as well as while turing both clockwise and counterclockwise. Pt w/ noted increased dizziness specifically w/ rotational movements and minor dizziness when walking backwards, increased LOB noted during these movements as well. Pt educated to avoid quick movements to decrease chance of LOB. No AD used during this tx session, HHA provided by physical therapist at times. Pt req (S) at start of tx session for all toileting needs.   Treatment Session 2: 1:1. Pt not willing to get OOB at start of tx session despite education regarding benefits of mobility to improve pt's health as she stated improvement of her health was her primary goal while here. Pt only agreeable to supine therex at first. Pt  performed 1-x10reps of: ankle pumps, heel presses, quad sets, SLRs, hip add in hooklying, bottom squeezes and bridging. Pt agreeable to OOB mobility after finding supine therex boring. Focus on dynamic balance challenges during amb in busy and quiet hallway environments. Pt able to amb >1000' w/ HHA and min-mod A x1person for balance as well as 1 seated rest break. Pt challenged by increasing or decreasing amb speed, stopping/starting, looking up/down, looking left/right, sidestepping and walking backwards. No overt LOB, however, pt demonstrated increased difficulty w/ looking left/right, walking backwards and did not demonstrate significant speed change. Full participation in tx session limited by increased pain in bottom at end of session and pt fatigue, RN made aware.   Therapy Documentation Precautions:  Precautions Precautions: Fall Precaution Comments: daughter reports no h/o imbalance PTA--unclear why she fell Restrictions Weight Bearing Restrictions: No   Pain:  No/denies pain   See FIM for current functional status  Therapy/Group: Individual Therapy and Co-Treatment  Denzil Hughes 08/29/2012, 12:16 PM

## 2012-08-29 NOTE — Progress Notes (Signed)
Subjective/Complaints: Slept well last night. Denies pain. In good spirits.  A 12 point review of systems has been performed and if not noted above is otherwise negative.   Objective: Vital Signs: Blood pressure 186/83, pulse 80, temperature 97.7 F (36.5 C), temperature source Oral, resp. rate 18, height 5\' 6"  (1.676 m), weight 66.3 kg (146 lb 2.6 oz), SpO2 98.00%. No results found. No results found for this basename: WBC, HGB, HCT, PLT,  in the last 72 hours No results found for this basename: NA, K, CL, CO, GLUCOSE, BUN, CREATININE, CALCIUM,  in the last 72 hours CBG (last 3)  No results found for this basename: GLUCAP,  in the last 72 hours  Wt Readings from Last 3 Encounters:  08/28/12 66.3 kg (146 lb 2.6 oz)  08/20/12 67.5 kg (148 lb 13 oz)  08/14/12 64.9 kg (143 lb 1.3 oz)     CBG (last 3)  No results found for this basename: GLUCAP,  in the last 72 hours  Wt Readings from Last 3 Encounters:  08/28/12 66.3 kg (146 lb 2.6 oz)  08/20/12 67.5 kg (148 lb 13 oz)  08/14/12 64.9 kg (143 lb 1.3 oz)    Physical Exam:  Constitutional: She appears well-developed and well-nourished.  Right facial ecchymoses, wounds all showing improvement. Nasal bridge with abrasions with ecchymosis. Right forehead laceration. All areas healing nicely.  HENT: THRUSH on tongue Head: Normocephalic.  Eyes: Pupils are equal, round, and reactive to light. Right eye exhibits discharge.  Neck: Normal range of motion. Neck supple.  Cardiovascular: Normal rate.  Pulmonary/Chest: Effort normal and breath sounds normal.  Abdominal: Soft. Bowel sounds are normal. She exhibits no distension. There is no tenderness.  Neurological: She is alert.  Very alert. Speech clear but slow with delayed processing persistent. Mild expressive deficits as well.   Has receptive language and processing deficits as well.. Delayed and often Unable to follow 2 step commands. perseveration with motor (especially with LUE) and  verbal apraxia noted. BLE with ataxic movements.  Skin: Skin is warm and dry. Large bruise right lateral breast area   Assessment/Plan: 1. 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 Physiatrist is providing close team supervision and 24 hour management of active medical problems listed below. Physiatrist and rehab team continue to assess barriers to discharge/monitor patient progress toward functional and medical goals.  FIM: FIM - Bathing Bathing Steps Patient Completed: Chest;Right Arm;Left Arm;Abdomen;Buttocks;Front perineal area;Left lower leg (including foot);Right lower leg (including foot);Right upper leg;Left upper leg Bathing: 4: Steadying assist  FIM - Upper Body Dressing/Undressing Upper body dressing/undressing steps patient completed: Thread/unthread right sleeve of pullover shirt/dresss;Put head through opening of pull over shirt/dress;Pull shirt over trunk Upper body dressing/undressing: 4: Min-Patient completed 75 plus % of tasks FIM - Lower Body Dressing/Undressing Lower body dressing/undressing steps patient completed: Thread/unthread right underwear leg;Thread/unthread left underwear leg;Pull underwear up/down;Thread/unthread right pants leg;Thread/unthread left pants leg;Pull pants up/down;Don/Doff right sock;Don/Doff left sock;Don/Doff left shoe;Don/Doff right shoe Lower body dressing/undressing: 4: Steadying Assist  FIM - Toileting Toileting steps completed by patient: Adjust clothing prior to toileting;Performs perineal hygiene;Adjust clothing after toileting Toileting Assistive Devices: Grab bar or rail for support Toileting: 4: Steadying assist  FIM - Diplomatic Services operational officer Devices: Best boy Transfers: 4-To toilet/BSC: Min A (steadying Pt. > 75%);4-From toilet/BSC: Min A (steadying  Pt. > 75%)  FIM - Bed/Chair Transfer  Bed/Chair Transfer Assistive Devices: Arm rests;Walker Bed/Chair Transfer: 5: Supine > Sit: Supervision (verbal cues/safety issues);4: Bed > Chair or W/C: Min A (steadying Pt. > 75%);4: Chair or W/C > Bed: Min A (steadying Pt. > 75%);5: Sit > Supine: Supervision (verbal cues/safety issues)  FIM - Locomotion: Wheelchair Distance: 100', 50' Locomotion: Wheelchair: 1: Total Assistance/staff pushes wheelchair (Pt<25%) FIM - Locomotion: Ambulation Locomotion: Ambulation Assistive Devices: Other (comment) (HHA) Ambulation/Gait Assistance: 3: Mod assist Locomotion: Ambulation: 3: Travels 150 ft or more with moderate assistance (Pt: 50 - 74%)  Comprehension Comprehension Mode: Auditory Comprehension: 3-Understands basic 50 - 74% of the time/requires cueing 25 - 50%  of the time  Expression Expression Mode: Verbal Expression: 4-Expresses basic 75 - 89% of the time/requires cueing 10 - 24% of the time. Needs helper to occlude trach/needs to repeat words.  Social Interaction Social Interaction: 2-Interacts appropriately 25 - 49% of time - Needs frequent redirection.  Problem Solving Problem Solving: 2-Solves basic 25 - 49% of the time - needs direction more than half the time to initiate, plan or complete simple activities  Memory Memory: 3-Recognizes or recalls 50 - 74% of the time/requires cueing 25 - 49% of the time  Medical Problem List and Plan:  1. DVT Prophylaxis/Anticoagulation: Mechanical: Sequential compression devices, below knee Bilateral lower extremities  2. Pain Management: tylenol prn for headaches.  3. H/o depression/anxiety/claustophobia: -appreciate Neuropsych recs  -will increase paxil and add low dose prn xanax for anxiety 4. Neuropsych: This patient is not capable of making decisions on her own behalf.  5. Leucocytosis: Sinusitis noted on Xrays. Urine cx still pending  -cath tip culture still pending.  6. ABLA: hgb 10. Reactive  increase in platetets 7. Dizziness  : likely vestibular component. Therapy attempting to work with and through 7. HTN: will monitor with bid checks. Continue HCTZ 9. Hypokalemia/FEN- continue current rx 10. Insomnia: scheduled trazodone qhs seemed to really help last night   LOS (Days) 6 A FACE TO FACE EVALUATION WAS PERFORMED  SWARTZ,ZACHARY T 08/29/2012 8:56 AM

## 2012-08-29 NOTE — Progress Notes (Signed)
Occupational Therapy Session Note  Patient Details  Name: Theresa Lynch MRN: [id number] Date of Birth: 1929-06-21  Today's Date: 08/29/2012 Time: 1015-1110 Time Calculation (min): 55 min  Short Term Goals: Week 1:  OT Short Term Goal 1 (Week 1): Pt. will maintain sustained attention during dressing enough to don shirt OT Short Term Goal 2 (Week 1): Pt. will don  shirt with SBA OT Short Term Goal 3 (Week 1): Pt. will dresss LB with SBA OT Short Term Goal 4 (Week 1): Pt will be oriented to time with calendar OT Short Term Goal 5 (Week 1): Pt. will transfer to toilet with SBA  Skilled Therapeutic Interventions/Progress Updates:    1:1 self care retraining task with focus on simple problem solving, sequencing and task organization with extra time provided. Pt required extra time for pt to initiate getting out of bed and begin morning routine. Initially  pt reported she wanted to shower first and then pick out her clothes but when she got up she went straight to dresser to pick out clothing. Pt required min instructional  cuing to locate all items in dresser. Pt then transitioned her self into the bathroom for toileting, undressing and getting into the shower. Pt did show some preservation with washing but able to terminate and move on with extra time-no cuing needed. Pt got dressed with no mistakes today. Pt perform hair brushing, toothbrushing and donning deodorant with supervision. Pt then proceed to put neosporin all over her face- when therapist pointed out what she was using pt with no recognition - reported she wanted something on her face. Performed basic visual tracking test and pt unable to move head and maintain eye contact fixed on an object in front of her but no reports of dizziness.  Engaged in game of Spot it - pt difficulty with visual scanning large and smaller field, and required min questioning cues  for working memory in order to complete. Pt requested to lay down before lunch with  SLp- left in bed with bed alarm on.   Therapy Documentation Precautions:  Precautions Precautions: Fall Precaution Comments: daughter reports no h/o imbalance PTA--unclear why she fell Restrictions Weight Bearing Restrictions: No Pain: Pain Assessment Pain Assessment: 0-10 Pain Score: 0-No pain  See FIM for current functional status  Therapy/Group: Individual Therapy  Roney Mans [facility] 08/29/2012, 11:41 AM

## 2012-08-29 NOTE — Progress Notes (Signed)
Speech Language Pathology Daily Session Notes  Patient Details  Name: BAANI BOBER MRN: [id number] Date of Birth: 1929/12/02  Today's Date: 08/29/2012  Session 1 Time: 1130-1210 Time Calculation (min): 40 min  Session 2 Time: 1315-1340 Time Calculation: 25 min  Short Term Goals: Week 1: SLP Short Term Goal 1 (Week 1): Pt will demonstrate sustained attention to a fucntional task for 2 minutes with Max A verbal and tactile cues for redirection SLP Short Term Goal 2 (Week 1): Pt will initate functional tasks with Mod A verbal and tactile cues.  SLP Short Term Goal 3 (Week 1): Pt will utilize call bell to express wants/needs with Max A verbal and question cues. SLP Short Term Goal 4 (Week 1): Pt will identify 1 physical and 1 cognitive impairment with Max multimodal cueing SLP Short Term Goal 5 (Week 1): Pt will demonstrate functioanl problem solving for basic and familiar tasks with Mod A verbal cues.  SLP Short Term Goal 6 (Week 1): Pt will consume trials of regular textures and demonstrate efficient mastication without overt s/s of aspiraton   Skilled Therapeutic Interventions:   Session 1: Treatment focus on dysphagia goals. SLP facilitated session by providing intermittent supervision verbal cues for utilization of small bites with lunch meal of regular textures and thin liquids.  Pt demonstrated efficient mastication without overt s/s of aspiration. Pt sustained attention to meal for ~40 minutes with Mod I and also demonstrated intermittent alternating attention between self-feeding and conversation with Mod I. Pt with increased verbal expression and overall initiation of social interaction.  Pt independently requested to use the bathroom X 2. Verbal expression errors were not noted throughout the session.  Recommend to continue current diet of upgraded textures.   Session 2: Treatment focus on cognitive goals and family education. Upon arrival, pt was asleep in bed but easily awakened. Pt  followed 1 step commands with 100% accuracy and 2 step commands with 50% accuracy. Pt also participated in generative naming task and required supervision question cues for word-finding. Pt's family present towards end of session and educated on pt's current swallowing and cognitive function and goals of SLP intervention. Family verbalized understanding of information.    FIM:  Comprehension Comprehension Mode: Auditory Comprehension: 5-Understands basic 90% of the time/requires cueing < 10% of the time Expression Expression Mode: Verbal Expression: 5-Expresses basic 90% of the time/requires cueing < 10% of the time. Social Interaction Social Interaction: 4-Interacts appropriately 75 - 89% of the time - Needs redirection for appropriate language or to initiate interaction. Problem Solving Problem Solving: 3-Solves basic 50 - 74% of the time/requires cueing 25 - 49% of the time Memory Memory: 3-Recognizes or recalls 50 - 74% of the time/requires cueing 25 - 49% of the time FIM - Eating Eating Activity: 6: More than reasonable amount of time  Pain Pain Assessment Pain Assessment: No/denies pain Pain Score: 0-No pain  Therapy/Group: Individual Therapy  Danaria Larsen 08/29/2012, 1:12 PM

## 2012-08-29 NOTE — Progress Notes (Signed)
Occupational Therapy Session Note  Patient Details  Name: Theresa Lynch MRN: [id number] Date of Birth: [birth year]  Today's Date: 08/29/2012 Time: 1330-1400 Time Calculation (min): 30 min  Short Term Goals: Week 1:  OT Short Term Goal 1 (Week 1): Pt. will maintain sustained attention during dressing enough to don shirt OT Short Term Goal 2 (Week 1): Pt. will don  shirt with SBA OT Short Term Goal 3 (Week 1): Pt. will dresss LB with SBA OT Short Term Goal 4 (Week 1): Pt will be oriented to time with calendar OT Short Term Goal 5 (Week 1): Pt. will transfer to toilet with SBA  Skilled Therapeutic Interventions/Progress Updates:    1:1 Cognitive retraining: Pt oriented to time and date by using calendar and clock on wall without cuing. Attempted to engaged pt in Call It for memory and making word association as a strategy   for remembering; however pt reported she didn't want to perform task - reporting she was tired and needed her rest. Educated on purpose of therapy and goals trying to obtain. Engaged in reading and recalling one thing she did in each of her therapies. Pt was able to use her schedule with mod encouragement and was able to recall one activity from each session. Pt also participated in looking at menu and choosing choices for breakfast tomorrow. When pt writing down her desired items pt miswrote two of the words without awareness of mistake- when prompted her to look at them again she could not recall what it was suppose to be but when told she was able to write the correct word.   Therapy Documentation Precautions:  Precautions Precautions: Fall Precaution Comments: daughter reports no h/o imbalance PTA--unclear why she fell Restrictions Weight Bearing Restrictions: No Pain: Pain Assessment Pain Assessment: No/denies pain  See FIM for current functional status  Therapy/Group: Individual Therapy  Roney Mans [facility] 08/29/2012, 2:32 PM

## 2012-08-30 ENCOUNTER — Encounter (HOSPITAL_COMMUNITY): Payer: Medicare Other | Admitting: Occupational Therapy

## 2012-08-30 ENCOUNTER — Inpatient Hospital Stay (HOSPITAL_COMMUNITY): Payer: Medicare Other | Admitting: Physical Therapy

## 2012-08-30 ENCOUNTER — Inpatient Hospital Stay (HOSPITAL_COMMUNITY): Payer: Medicare Other | Admitting: Speech Pathology

## 2012-08-30 ENCOUNTER — Inpatient Hospital Stay (HOSPITAL_COMMUNITY): Payer: Medicare Other

## 2012-08-30 DIAGNOSIS — W19XXXA Unspecified fall, initial encounter: Secondary | ICD-10-CM

## 2012-08-30 DIAGNOSIS — E876 Hypokalemia: Secondary | ICD-10-CM

## 2012-08-30 DIAGNOSIS — S069X9A Unspecified intracranial injury with loss of consciousness of unspecified duration, initial encounter: Secondary | ICD-10-CM

## 2012-08-30 LAB — GLUCOSE, CAPILLARY

## 2012-08-30 LAB — URINE CULTURE: Colony Count: 45000

## 2012-08-30 LAB — CATH TIP CULTURE

## 2012-08-30 NOTE — Progress Notes (Signed)
Physical Th=erapy Session Note  Patient Details  Name: Theresa Lynch MRN: [id number] Date of Birth: 10/05/1929  Today's Date: 08/30/2012 Time:      0915-1000                  4098-1191                  1335-1405 Time Calculation (min):  45 min                                              30 min                                               30 min  Short Term Goals: Week 1:   = LTGs  Skilled Therapeutic Interventions/Progress Updates:     Pt seen in three treatment sessions today:    Treatment one: Pt reports 8/10 pain score in LB region, nsg made aware.  Pt performed supine to sit and bed mobility with supervision assist with use of bed rails and without.  Sit<>stand and stand pivot transfers with supervision assist.  Supine LB stretches to improve LB and improve mobility, B LE strengthening exs 2 x 10 with pt requiring min assist for proper technique. Gait with HHA in and around room to improve safety with functional mobility. Pt displayed LOB with rapid turns and when distracted.   Treatment two: Pt reports improved pain LB decreased to 1-2/10, slight increase when sitting on firm surfaces.  Tenderness noted in L sacral region. Pt performed standing static and dynamic balance activities with focus on L side, crossing midline and reaching to improve inattention to L side. Pt performed gait training with HHA overall min assist increased assist with fatigue and when distracted with problem solving and path finding slight increased assistance, pt displayed R knee buckle x 3 and required sitting. Pt c/o fatigue.  Treatment three: Pt continued with dynamic balance activities, cognitive activities with balance with pt displaying some difficulty following 2 -3 step memory commands, required constant cues and redirection.  Pt performed gait training with head turns, rapid stop and go with pt displaying LOB requiring therapist assist to regain balance.   Therapy Documentation Precautions:   Precautions Precautions: Fall Precaution Comments: daughter reports no h/o imbalance PTA--unclear why she fell Restrictions Weight Bearing Restrictions: No   Pain: Pain Assessment Pain Assessment: Treatment one: 8/10 LB, nsg made aware. Treatment two; 1-2/10 LB Treatment three: 4-5/10, nsg made aware.  Pain Score: 0-No pain    Locomotion : Ambulation Ambulation/Gait Assistance: 4: Min assist            See FIM for current functional status  Therapy/Group: Individual Therapy all Three sessions.  Shiela Mayer A 08/30/2012, 3:51 PM

## 2012-08-30 NOTE — Progress Notes (Signed)
Physical Therapy Weekly Progress Note  Patient Details  Name: Theresa Lynch MRN: [id number] Date of Birth: [birth year]  Today's Date: 08/30/2012 Time: 4098-1191 Time Calculation (min): 55 min  Patient has met 0 of 7 long term goals.  Short term goals not set due to estimated length of stay.  Pt did meet initial LTG level (S) for bed mobility, goal upgraded due to pt progress and anticipate pt to safely perform (I) bed mob. Pt demonstrates good progress towards all remaining goals. Pt has partly met ambulation goals as she can amb at or above target distances, however, continues to req min-mod A for balance during standing functional mobility w/ HHA. Appropirate DME continues TBD.    Patient continues to demonstrate the following deficits: decreased functional endurance, decreased ability to attend to task, decreased awareness of deficits, decreased orientation to both L and R sides, decreased balance and coordination, dizziness and therefore will continue to benefit from skilled PT intervention to enhance overall performance with activity tolerance, balance, attention, awareness and coordination.  See Patient's Care Plan for progression toward long term goals.  Patient progressing toward long term goals..  Continue plan of care.  Skilled Therapeutic Interventions/Progress Updates:    1:1. Focus this tx session on dynamic standing balance, attention (scavenger hunt) and pathfinding in busy environments. Pt very aggrivated at start of tx session due to concerns regarding medication, daughter stated that she would speak with the RN regarding her mother's concerns. Pt provided a list of 10 items and asked where she might find such items in the hospital. Pt with demonstration of word finding issues and stated, "that amusement shop place," meaning the gift shop. Pt able to complete 10 item gift shop scavenger hunt in . Due to busy environment both visually and physically with people, pt req mod-max v/c +  t/c to attend to items on list and redirect to task. Pt req consistent v/c to find items when on R side. Pt also req max v/c to find her room once back on unit. Pt unable to remember room number approx. 1 minute after being told by therapist. Pt resorting to looking in rooms to find her family and initially missed room due to it being on her R side. Pt only able to find room when she turned around and it was on her L. Pt able to amb >1000' throughout tx session w/ HHA and min-mod A x1person for balance. No overt LOB noted, however, pt able to self-correct multiple times, with increased LOB noted during scavenger hunt and R knee buckle 1x. Gait characterized by slow, but steady pace, intermittent crossing of B LE over mindline.  Therapy Documentation Precautions:  Precautions Precautions: Fall Precaution Comments: daughter reports no h/o imbalance PTA--unclear why she fell Restrictions Weight Bearing Restrictions: No  Pain: Pain Assessment Pain Assessment: No/denies pain Pain Score: 0-No pain  See FIM for current functional status  Therapy/Group: Individual Therapy  Denzil Hughes 08/30/2012, 4:00 PM

## 2012-08-30 NOTE — Progress Notes (Signed)
Speech Language Pathology Daily Session Notes  Patient Details  Name: Theresa Lynch MRN: [id number] Date of Birth: 1930-01-19  Today's Date: 08/30/2012  Session 1 Time: 7829-5621 Time Calculation (min): 30 min  Session 2 Time: Time Calculation:   Short Term Goals: Week 1: SLP Short Term Goal 1 (Week 1): Pt will demonstrate sustained attention to a fucntional task for 2 minutes with Max A verbal and tactile cues for redirection SLP Short Term Goal 2 (Week 1): Pt will initate functional tasks with Mod A verbal and tactile cues.  SLP Short Term Goal 3 (Week 1): Pt will utilize call bell to express wants/needs with Max A verbal and question cues. SLP Short Term Goal 4 (Week 1): Pt will identify 1 physical and 1 cognitive impairment with Max multimodal cueing SLP Short Term Goal 5 (Week 1): Pt will demonstrate functioanl problem solving for basic and familiar tasks with Mod A verbal cues.  SLP Short Term Goal 6 (Week 1): Pt will consume trials of regular textures and demonstrate efficient mastication without overt s/s of aspiraton   Skilled Therapeutic Interventions:  Session 1: Treatment focus on cognitive goals. SLP facilitated session by providing Mod A, verbal, visual and question cues for functional problem solving and recall of information during a new learning task with a mildly complex card game. Pt demonstrated appropriate insight into difficulty of task  but required mod A question cues to utilize memory compensatory strategy of writing information down to increase carryover of rules of task.    Session 2: Treatment focus on cognitive goals. SLP facilitated session by providing supervision question cues for utilization of visual memory aids for initial recall of newly learned information from previous session. As treatment session progress, pt required Mod verbal, visual and question cues to recall rules of task and for functional problem solving, suspect due to decreased attention. Pt  demonstrated emergent awareness of decreased "concentration and "not trying" which impacted her overall function with the task.   FIM:  Comprehension Comprehension Mode: Auditory Comprehension: 5-Understands basic 90% of the time/requires cueing < 10% of the time Expression Expression Mode: Verbal Expression: 5-Expresses basic 90% of the time/requires cueing < 10% of the time. Social Interaction Social Interaction: 4-Interacts appropriately 75 - 89% of the time - Needs redirection for appropriate language or to initiate interaction. Problem Solving Problem Solving: 3-Solves basic 50 - 74% of the time/requires cueing 25 - 49% of the time Memory Memory: 3-Recognizes or recalls 50 - 74% of the time/requires cueing 25 - 49% of the time  Pain Pain in bottom, pt repositioned and RN made aware  Therapy/Group: Individual Therapy  Lenise Jr 08/30/2012, 9:23 AM
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
## 2012-08-30 NOTE — Progress Notes (Signed)
Pt assessed.  Calm and cooperative throughout assessment.  Pt states that she would like to be addressed by her middle name of Theresa Lynch.  A&Ox3.  Will continue to monitor.Oretha Milch D

## 2012-08-30 NOTE — Progress Notes (Signed)
Patient ID: Theresa Lynch, female   DOB: 1929-03-07, 79 y.o.   MRN: [id number] Subjective/Complaints: Slept well last night. Denies pain. No other complaints  A 12 point review of systems has been performed and if not noted above is otherwise negative.   Objective: Vital Signs: Blood pressure 130/80, pulse 67, temperature 98.5 F (36.9 C), temperature source Oral, resp. rate 17, height 5\' 6"  (1.676 m), weight 66.3 kg (146 lb 2.6 oz), SpO2 96.00%. No results found. No results found for this basename: WBC, HGB, HCT, PLT,  in the last 72 hours No results found for this basename: NA, K, CL, CO, GLUCOSE, BUN, CREATININE, CALCIUM,  in the last 72 hours CBG (last 3)   Recent Labs  08/29/12 2018  GLUCAP 131*    Wt Readings from Last 3 Encounters:  08/28/12 66.3 kg (146 lb 2.6 oz)  08/20/12 67.5 kg (148 lb 13 oz)  08/14/12 64.9 kg (143 lb 1.3 oz)     CBG (last 3)   Recent Labs  08/29/12 2018  GLUCAP 131*    Wt Readings from Last 3 Encounters:  08/28/12 66.3 kg (146 lb 2.6 oz)  08/20/12 67.5 kg (148 lb 13 oz)  08/14/12 64.9 kg (143 lb 1.3 oz)    Physical Exam:  Constitutional: She appears well-developed and well-nourished.  Right facial ecchymoses, wounds all showing improvement. Nasal bridge with abrasions with ecchymosis. Right forehead laceration. All areas healing nicely.  HENT: THRUSH on tongue Head: Normocephalic.  Eyes: Pupils are equal, round, and reactive to light. Right eye exhibits discharge.  Neck: Normal range of motion. Neck supple.  Cardiovascular: Normal rate.  Pulmonary/Chest: Effort normal and breath sounds normal.  Abdominal: Soft. Bowel sounds are normal. She exhibits no distension. There is no tenderness.  Neurological: She is alert.  Very alert. Speech clear but slow with delayed processing persistent. Mild expressive deficits as well.   Has receptive language and processing deficits as well.. Delayed and often Unable to follow 2 step commands.  perseveration with motor (especially with LUE) and verbal apraxia noted. BLE with ataxic movements.  Skin: Skin is warm and dry. Large bruise right lateral breast area   Assessment/Plan: 1. 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 Physiatrist is providing close team supervision and 24 hour management of active medical problems listed below. Physiatrist and rehab team continue to assess barriers to discharge/monitor patient progress toward functional and medical goals.  FIM: FIM - Bathing Bathing Steps Patient Completed: Chest;Right Arm;Left Arm;Abdomen;Buttocks;Front perineal area;Left lower leg (including foot);Right lower leg (including foot);Right upper leg;Left upper leg Bathing: 4: Steadying assist  FIM - Upper Body Dressing/Undressing Upper body dressing/undressing steps patient completed: Thread/unthread right sleeve of pullover shirt/dresss;Put head through opening of pull over shirt/dress;Pull shirt over trunk;Thread/unthread left sleeve of pullover shirt/dress Upper body dressing/undressing: 5: Supervision: Safety issues/verbal cues FIM - Lower Body Dressing/Undressing Lower body dressing/undressing steps patient completed: Thread/unthread right underwear leg;Thread/unthread left underwear leg;Pull underwear up/down;Thread/unthread right pants leg;Thread/unthread left pants leg;Pull pants up/down;Don/Doff left shoe;Don/Doff right shoe Lower body dressing/undressing: 4: Steadying Assist  FIM - Toileting Toileting steps completed by patient: Adjust clothing prior to toileting;Performs perineal hygiene;Adjust clothing after toileting Toileting Assistive Devices: Grab bar or rail for support Toileting: 5: Supervision: Safety issues/verbal cues  FIM - Diplomatic Services operational officer Devices: Grab bars Toilet Transfers: 5-To  toilet/BSC: Supervision (verbal cues/safety issues);5-From toilet/BSC: Supervision (  verbal cues/safety issues)  FIM - Press photographer Assistive Devices: Bed rails Bed/Chair Transfer: 5: Supine > Sit: Supervision (verbal cues/safety issues);5: Bed > Chair or W/C: Supervision (verbal cues/safety issues);4: Bed > Chair or W/C: Min A (steadying Pt. > 75%);4: Chair or W/C > Bed: Min A (steadying Pt. > 75%)  FIM - Locomotion: Wheelchair Distance: 100', 50' Locomotion: Wheelchair: 1: Total Assistance/staff pushes wheelchair (Pt<25%) FIM - Locomotion: Ambulation Locomotion: Ambulation Assistive Devices: Other (comment) (HHA) Ambulation/Gait Assistance: 3: Mod assist Locomotion: Ambulation: 3: Travels 150 ft or more with moderate assistance (Pt: 50 - 74%)  Comprehension Comprehension Mode: Auditory Comprehension: 5-Understands basic 90% of the time/requires cueing < 10% of the time  Expression Expression Mode: Verbal Expression: 5-Expresses basic 90% of the time/requires cueing < 10% of the time.  Social Interaction Social Interaction: 4-Interacts appropriately 75 - 89% of the time - Needs redirection for appropriate language or to initiate interaction.  Problem Solving Problem Solving: 3-Solves basic 50 - 74% of the time/requires cueing 25 - 49% of the time  Memory Memory: 3-Recognizes or recalls 50 - 74% of the time/requires cueing 25 - 49% of the time  Medical Problem List and Plan:  1. DVT Prophylaxis/Anticoagulation: Mechanical: Sequential compression devices, below knee Bilateral lower extremities  2. Pain Management: tylenol prn for headaches.  3. H/o depression/anxiety/claustophobia: -appreciate Neuropsych recs  -will increase paxil and add low dose prn xanax for anxiety, seems to be helping 4. Neuropsych: This patient is not capable of making decisions on her own behalf.  5. Leucocytosis: Sinusitis noted on Xrays. Urine cx still pending  -cath tip culture  still pending.  6. ABLA: hgb 10. Reactive increase in platetets 7. Dizziness  : likely vestibular component. Therapy attempting to work with and through 7. HTN: will monitor with bid checks. Continue HCTZ 9. Hypokalemia/FEN- continue current rx 10. Insomnia: scheduled trazodone qhs helpful   LOS (Days) 7 A FACE TO FACE EVALUATION WAS PERFORMED  Erick Colace 08/30/2012 10:24 AM
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
## 2012-08-31 ENCOUNTER — Inpatient Hospital Stay (HOSPITAL_COMMUNITY): Payer: Medicare Other | Admitting: Physical Therapy

## 2012-08-31 ENCOUNTER — Inpatient Hospital Stay (HOSPITAL_COMMUNITY): Payer: Medicare Other | Admitting: Speech Pathology

## 2012-08-31 NOTE — Progress Notes (Signed)
Physical Therapy Session Note  Patient Details  Name: Theresa Lynch MRN: [id number] Date of Birth: [birth year]  Today's Date: 08/31/2012 Time: 1000-1030 Time Calculation (min): 30 min  Therapy Documentation Precautions:  Precautions Precautions: Fall Precaution Comments: daughter reports no h/o imbalance PTA--unclear why she fell Restrictions Weight Bearing Restrictions: No Pain: no pain  Therapeutic Activity:(15')Bed Mobility I/Mod-I, Transfers S/Mod-I Gait Training:(15') Ambulation using RW x 300' S/Mod-I and without assistive device x 150' with SBA. Scanning activities during gait with patient with ongoing need for verbal cueing with left side attention. Patient has ongoing difficulties with short term memory (ie; remembering room number within one minute of telling her)  Therapy/Group: Individual Therapy  Josuha Fontanez J 08/31/2012, 10:04 AM

## 2012-08-31 NOTE — Progress Notes (Signed)
Speech Language Pathology Daily Session Note  Patient Details  Name: Theresa Lynch MRN: [id number] Date of Birth: 06/24/1929  Today's Date: 08/31/2012 Time: 0935-1000 Time Calculation (min): 25 min  Short Term Goals: Week 1: SLP Short Term Goal 1 (Week 1): Pt will demonstrate sustained attention to a fucntional task for 2 minutes with Max A verbal and tactile cues for redirection SLP Short Term Goal 2 (Week 1): Pt will initate functional tasks with Mod A verbal and tactile cues.  SLP Short Term Goal 3 (Week 1): Pt will utilize call bell to express wants/needs with Max A verbal and question cues. SLP Short Term Goal 4 (Week 1): Pt will identify 1 physical and 1 cognitive impairment with Max multimodal cueing SLP Short Term Goal 5 (Week 1): Pt will demonstrate functioanl problem solving for basic and familiar tasks with Mod A verbal cues.  SLP Short Term Goal 6 (Week 1): Pt will consume trials of regular textures and demonstrate efficient mastication without overt s/s of aspiraton   Skilled Therapeutic Interventions: iTherapeutic intervention for cognition complete.  The patient required max verbal cues to identify impairments that have resulted from fall.  She was able to verbalize and implement how to get help with call button, with min verbal cues.  She was able to remained focused on basic task for 5 minutes during therapy today.     FIM:  Comprehension Comprehension Mode: Auditory Comprehension: 4-Understands basic 75 - 89% of the time/requires cueing 10 - 24% of the time Expression Expression Mode: Verbal Expression: 3-Expresses basic 50 - 74% of the time/requires cueing 25 - 50% of the time. Needs to repeat parts of sentences. Social Interaction Social Interaction: 4-Interacts appropriately 75 - 89% of the time - Needs redirection for appropriate language or to initiate interaction. Problem Solving Problem Solving: 3-Solves basic 50 - 74% of the time/requires cueing 25 - 49% of the  time Memory Memory: 3-Recognizes or recalls 50 - 74% of the time/requires cueing 25 - 49% of the time FIM - Eating Eating Activity: 5: Supervision/cues  Pain Pain Assessment Pain Assessment: No/denies pain  Therapy/Group: Individual Therapy  Lenny Pastel 08/31/2012, 5:28 PM

## 2012-08-31 NOTE — Progress Notes (Signed)
Patient ID: Theresa Lynch, female   DOB: 08-24-1929, 79 y.o.   MRN: [id number] Subjective/Complaints:  A 12 point review of systems has been performed and if not noted above is otherwise negative.   Objective: Vital Signs: Blood pressure 130/81, pulse 61, temperature 98.2 F (36.8 C), temperature source Oral, resp. rate 20, height 5\' 6"  (1.676 m), weight 66.3 kg (146 lb 2.6 oz), SpO2 98.00%. No results found. No results found for this basename: WBC, HGB, HCT, PLT,  in the last 72 hours No results found for this basename: NA, K, CL, CO, GLUCOSE, BUN, CREATININE, CALCIUM,  in the last 72 hours CBG (last 3)   Recent Labs  08/29/12 2018  GLUCAP 131*    Wt Readings from Last 3 Encounters:  08/28/12 66.3 kg (146 lb 2.6 oz)  08/20/12 67.5 kg (148 lb 13 oz)  08/14/12 64.9 kg (143 lb 1.3 oz)     CBG (last 3)   Recent Labs  08/29/12 2018  GLUCAP 131*    Wt Readings from Last 3 Encounters:  08/28/12 66.3 kg (146 lb 2.6 oz)  08/20/12 67.5 kg (148 lb 13 oz)  08/14/12 64.9 kg (143 lb 1.3 oz)    Physical Exam:  Constitutional: She appears well-developed and well-nourished.  Right facial ecchymoses, wounds all showing improvement. Nasal bridge with abrasions with ecchymosis. Right forehead laceration. All areas healing nicely.  HENT: THRUSH on tongue Head: Normocephalic.  Eyes: Pupils are equal, round, and reactive to light. Right eye exhibits discharge.  Neck: Normal range of motion. Neck supple.  Cardiovascular: Normal rate.  Pulmonary/Chest: Effort normal and breath sounds normal.  Abdominal: Soft. Bowel sounds are normal. She exhibits no distension. There is no tenderness.  Neurological: She is alert.  Very alert. Oriented to person and place but not time Speech clear but slow with delayed processing persistent. Mild expressive deficits as well.   Has receptive language and processing deficits as well.. Delayed and often Unable to follow 2 step commands. perseveration with  motor (especially with LUE) and verbal apraxia noted. BLE with ataxic movements.  Skin: Skin is warm and dry. Large bruise right lateral breast area   Assessment/Plan: 1. 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 Physiatrist is providing close team supervision and 24 hour management of active medical problems listed below. Physiatrist and rehab team continue to assess barriers to discharge/monitor patient progress toward functional and medical goals.  FIM: FIM - Bathing Bathing Steps Patient Completed: Chest;Right Arm;Left Arm;Abdomen;Buttocks;Front perineal area;Left lower leg (including foot);Right lower leg (including foot);Right upper leg;Left upper leg Bathing: 4: Steadying assist  FIM - Upper Body Dressing/Undressing Upper body dressing/undressing steps patient completed: Thread/unthread right sleeve of pullover shirt/dresss;Put head through opening of pull over shirt/dress;Pull shirt over trunk;Thread/unthread left sleeve of pullover shirt/dress Upper body dressing/undressing: 5: Supervision: Safety issues/verbal cues FIM - Lower Body Dressing/Undressing Lower body dressing/undressing steps patient completed: Thread/unthread right underwear leg;Thread/unthread left underwear leg;Pull underwear up/down;Thread/unthread right pants leg;Thread/unthread left pants leg;Pull pants up/down;Don/Doff left shoe;Don/Doff right shoe Lower body dressing/undressing: 4: Steadying Assist  FIM - Toileting Toileting steps completed by patient: Adjust clothing prior to toileting;Performs perineal hygiene;Adjust clothing after toileting Toileting Assistive Devices: Grab bar or rail for support Toileting: 5: Supervision: Safety issues/verbal cues  FIM - Diplomatic Services operational officer Devices: Grab bars Toilet Transfers: 5-To toilet/BSC:  Supervision (verbal cues/safety issues);5-From toilet/BSC: Supervision (verbal  cues/safety issues)  FIM - Banker Devices: Bed rails Bed/Chair Transfer: 5: Supine > Sit: Supervision (verbal cues/safety issues);5: Sit > Supine: Supervision (verbal cues/safety issues);4: Bed > Chair or W/C: Min A (steadying Pt. > 75%);4: Chair or W/C > Bed: Min A (steadying Pt. > 75%)  FIM - Locomotion: Wheelchair Distance: 100', 50' Locomotion: Wheelchair: 0: Activity did not occur FIM - Locomotion: Ambulation Locomotion: Ambulation Assistive Devices: Other (comment) (HHA) Ambulation/Gait Assistance: 4: Min assist Locomotion: Ambulation: 3: Travels 150 ft or more with moderate assistance (Pt: 50 - 74%)  Comprehension Comprehension Mode: Auditory Comprehension: 4-Understands basic 75 - 89% of the time/requires cueing 10 - 24% of the time  Expression Expression Mode: Verbal Expression: 4-Expresses basic 75 - 89% of the time/requires cueing 10 - 24% of the time. Needs helper to occlude trach/needs to repeat words.  Social Interaction Social Interaction: 4-Interacts appropriately 75 - 89% of the time - Needs redirection for appropriate language or to initiate interaction.  Problem Solving Problem Solving: 3-Solves basic 50 - 74% of the time/requires cueing 25 - 49% of the time  Memory Memory: 3-Recognizes or recalls 50 - 74% of the time/requires cueing 25 - 49% of the time  Medical Problem List and Plan:  1. DVT Prophylaxis/Anticoagulation: Mechanical: Sequential compression devices, below knee Bilateral lower extremities  2. Pain Management: tylenol prn for headaches.  3. H/o depression/anxiety/claustophobia: -appreciate Neuropsych recs  -will increase paxil and add low dose prn xanax for anxiety, seems to be helping 4. Neuropsych: This patient is not capable of making decisions on her own behalf.  5. Leucocytosis: Sinusitis noted on Xrays.afebrile no URI  symptoms Urine cx neg  -cath tip culture neg, monitor temp and CBC 6. ABLA: hgb 10. Reactive increase in platetets 7. Dizziness  : likely vestibular component. Therapy attempting to work with and through 7. HTN: will monitor with bid checks. Continue HCTZ 9. Hypokalemia/FEN- continue current rx 10. Insomnia: scheduled trazodone qhs helpful   LOS (Days) 8 A FACE TO FACE EVALUATION WAS PERFORMED  Claudette Laws E 08/31/2012 11:11 AM

## 2012-08-31 NOTE — Plan of Care (Signed)
Problem: RH SAFETY Goal: RH STG ADHERE TO SAFETY PRECAUTIONS W/ASSISTANCE/DEVICE STG Adhere to Safety Precautions With mod Assistance/Device.  Outcome: Progressing Can be impulsive due to urinary urgency, needs bed alarm at night.

## 2012-09-01 ENCOUNTER — Inpatient Hospital Stay (HOSPITAL_COMMUNITY): Payer: Medicare Other | Admitting: Occupational Therapy

## 2012-09-01 LAB — CBC WITH DIFFERENTIAL/PLATELET
Basophils Absolute: 0 10*3/uL (ref 0.0–0.1)
Eosinophils Relative: 2 % (ref 0–5)
HCT: 31.6 % — ABNORMAL LOW (ref 36.0–46.0)
Hemoglobin: 10.3 g/dL — ABNORMAL LOW (ref 12.0–15.0)
Lymphocytes Relative: 29 % (ref 12–46)
Lymphs Abs: 2.1 10*3/uL (ref 0.7–4.0)
MCV: 96 fL (ref 78.0–100.0)
Monocytes Absolute: 0.6 10*3/uL (ref 0.1–1.0)
Monocytes Relative: 9 % (ref 3–12)
Neutro Abs: 4.3 10*3/uL (ref 1.7–7.7)
RBC: 3.29 MIL/uL — ABNORMAL LOW (ref 3.87–5.11)
RDW: 15.2 % (ref 11.5–15.5)
WBC: 7.1 10*3/uL (ref 4.0–10.5)

## 2012-09-01 NOTE — Progress Notes (Addendum)
Occupational Therapy Session Note  Patient Details  Name: Theresa Lynch MRN: [id number] Date of Birth: 1929/09/29  Today's Date: 09/01/2012 Time: 9147-8295 Time Calculation (min): 45 min  Skilled Therapeutic Interventions/Progress Updates: Initially upon approach patient very sleeping and difficult to arouse and required a little time to become oriented to situation.  She did ask this clinician to repeat the initial statement of therapy offering for ADL.  After allowing patient a few moments to become oriented and focused in order to initiate going supine to EOB to participate in the session.     She participated in Toileting and then ADL in w/c at sink with focus on endurance and dynamic balance and verbalization of needs or self expression.    Patient appeared to have no Losses of Balance or dizziness but did take approx 4 five min rest breaks during the session.   Patient asked to ly back down and rest after session concluded.  She was assisted back to bed with call bell, phone and bed alarm in place.     Therapy Documentation Precautions:  Precautions Precautions: Fall Precaution Comments: daughter reports no h/o imbalance PTA--unclear why she fell Restrictions Weight Bearing Restrictions: No  PAIN: denied  See FIM for current functional status  Therapy/Group: Individual Therapy  Bud Face [facility] 09/01/2012, 2:05 PM

## 2012-09-01 NOTE — Plan of Care (Signed)
Problem: RH SAFETY Goal: RH STG ADHERE TO SAFETY PRECAUTIONS W/ASSISTANCE/DEVICE STG Adhere to Safety Precautions With mod Assistance/Device.  Outcome: Progressing Calling for assistance; waiting for staff to arrive before getting up most of the time

## 2012-09-01 NOTE — Progress Notes (Signed)
Patient ID: Theresa Lynch, female   DOB: 02-Aug-1929, 79 y.o.   MRN: [id number] Subjective/Complaints: No problems overnight. No pain complaints. A 12 point review of systems has been performed and if not noted above is otherwise negative.   Objective: Vital Signs: Blood pressure 139/76, pulse 65, temperature 98 F (36.7 C), temperature source Oral, resp. rate 17, height 5\' 6"  (1.676 m), weight 66.3 kg (146 lb 2.6 oz), SpO2 97.00%. No results found.  Recent Labs  09/01/12 0627  WBC 7.1  HGB 10.3*  HCT 31.6*  PLT 576*   No results found for this basename: NA, K, CL, CO, GLUCOSE, BUN, CREATININE, CALCIUM,  in the last 72 hours CBG (last 3)   Recent Labs  08/29/12 2018  GLUCAP 131*    Wt Readings from Last 3 Encounters:  08/28/12 66.3 kg (146 lb 2.6 oz)  08/20/12 67.5 kg (148 lb 13 oz)  08/14/12 64.9 kg (143 lb 1.3 oz)     CBG (last 3)   Recent Labs  08/29/12 2018  GLUCAP 131*    Wt Readings from Last 3 Encounters:  08/28/12 66.3 kg (146 lb 2.6 oz)  08/20/12 67.5 kg (148 lb 13 oz)  08/14/12 64.9 kg (143 lb 1.3 oz)    Physical Exam:  Constitutional: She appears well-developed and well-nourished.  Right facial ecchymoses, wounds all showing improvement. Nasal bridge with abrasions with ecchymosis. Right forehead laceration. All areas healing nicely.  HENT: THRUSH on tongue Head: Normocephalic.  Eyes: Pupils are equal, round, and reactive to light. Right eye exhibits discharge.  Neck: Normal range of motion. Neck supple.  Cardiovascular: Normal rate.  Pulmonary/Chest: Effort normal and breath sounds normal.  Abdominal: Soft. Bowel sounds are normal. She exhibits no distension. There is no tenderness.  Neurological: She is alert.  Very alert. Oriented to person and place but not time Speech clear but slow with delayed processing persistent. Mild expressive deficits as well.   Has receptive language and processing deficits as well.. Delayed and often Unable to follow  2 step commands. perseveration with motor (especially with LUE) and verbal apraxia noted. BLE with ataxic movements.  Skin: Skin is warm and dry. Large bruise right lateral breast area   Assessment/Plan: 1. 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 Physiatrist is providing close team supervision and 24 hour management of active medical problems listed below. Physiatrist and rehab team continue to assess barriers to discharge/monitor patient progress toward functional and medical goals.  FIM: FIM - Bathing Bathing Steps Patient Completed: Chest;Right Arm;Left Arm;Abdomen;Buttocks;Front perineal area;Left lower leg (including foot);Right lower leg (including foot);Right upper leg;Left upper leg Bathing: 4: Steadying assist  FIM - Upper Body Dressing/Undressing Upper body dressing/undressing steps patient completed: Thread/unthread right sleeve of pullover shirt/dresss;Put head through opening of pull over shirt/dress;Pull shirt over trunk;Thread/unthread left sleeve of pullover shirt/dress Upper body dressing/undressing: 5: Supervision: Safety issues/verbal cues FIM - Lower Body Dressing/Undressing Lower body dressing/undressing steps patient completed: Thread/unthread right underwear leg;Thread/unthread left underwear leg;Pull underwear up/down;Thread/unthread right pants leg;Thread/unthread left pants leg;Pull pants up/down;Don/Doff left shoe;Don/Doff right shoe Lower body dressing/undressing: 4: Steadying Assist  FIM - Toileting Toileting steps completed by patient: Adjust clothing prior to toileting;Performs perineal hygiene;Adjust clothing after toileting Toileting Assistive Devices: Grab bar or rail for support Toileting: 4: Steadying assist  FIM - Diplomatic Services operational officer Devices: Grab bars Toilet Transfers: 4-To  toilet/BSC:  Min A (steadying Pt. > 75%);4-From toilet/BSC: Min A (steadying Pt. > 75%)  FIM - Bed/Chair Transfer Bed/Chair Transfer Assistive Devices: Bed rails Bed/Chair Transfer: 5: Supine > Sit: Supervision (verbal cues/safety issues);5: Sit > Supine: Supervision (verbal cues/safety issues);4: Bed > Chair or W/C: Min A (steadying Pt. > 75%);4: Chair or W/C > Bed: Min A (steadying Pt. > 75%)  FIM - Locomotion: Wheelchair Distance: 100', 50' Locomotion: Wheelchair: 0: Activity did not occur FIM - Locomotion: Ambulation Locomotion: Ambulation Assistive Devices: Other (comment) (HHA) Ambulation/Gait Assistance: 4: Min assist Locomotion: Ambulation: 3: Travels 150 ft or more with moderate assistance (Pt: 50 - 74%)  Comprehension Comprehension Mode: Auditory Comprehension: 4-Understands basic 75 - 89% of the time/requires cueing 10 - 24% of the time  Expression Expression Mode: Verbal Expression: 3-Expresses basic 50 - 74% of the time/requires cueing 25 - 50% of the time. Needs to repeat parts of sentences.  Social Interaction Social Interaction: 4-Interacts appropriately 75 - 89% of the time - Needs redirection for appropriate language or to initiate interaction.  Problem Solving Problem Solving: 3-Solves basic 50 - 74% of the time/requires cueing 25 - 49% of the time  Memory Memory: 3-Recognizes or recalls 50 - 74% of the time/requires cueing 25 - 49% of the time  Medical Problem List and Plan:  1. DVT Prophylaxis/Anticoagulation: Mechanical: Sequential compression devices, below knee Bilateral lower extremities  2. Pain Management: tylenol prn for headaches.  3. H/o depression/anxiety/claustophobia: -appreciate Neuropsych recs  - increased paxil and added low dose prn xanax for anxiety, seems to be helping 4. Neuropsych: This patient is not capable of making decisions on her own behalf.  5. Leucocytosis: Sinusitis noted on Xrays.afebrile no URI symptoms Urine cx neg  -cath tip  culture neg, monitor temp and CBC 6. ABLA: hgb 10. Reactive increase in platetets 7. Dizziness  : likely vestibular component. Therapy attempting to work with and through 7. HTN: will monitor with bid checks. Continue HCTZ 9. Hypokalemia/FEN- continue current rx 10. Insomnia: scheduled trazodone qhs helpful   LOS (Days) 9 A FACE TO FACE EVALUATION WAS PERFORMED  Erick Colace 09/01/2012 10:38 AM

## 2012-09-02 ENCOUNTER — Inpatient Hospital Stay (HOSPITAL_COMMUNITY): Payer: Medicare Other | Admitting: Occupational Therapy

## 2012-09-02 ENCOUNTER — Inpatient Hospital Stay (HOSPITAL_COMMUNITY): Payer: Medicare Other | Admitting: Speech Pathology

## 2012-09-02 ENCOUNTER — Inpatient Hospital Stay (HOSPITAL_COMMUNITY): Payer: Medicare Other

## 2012-09-02 DIAGNOSIS — E876 Hypokalemia: Secondary | ICD-10-CM

## 2012-09-02 DIAGNOSIS — S069X9A Unspecified intracranial injury with loss of consciousness of unspecified duration, initial encounter: Secondary | ICD-10-CM

## 2012-09-02 DIAGNOSIS — S069XAA Unspecified intracranial injury with loss of consciousness status unknown, initial encounter: Secondary | ICD-10-CM

## 2012-09-02 DIAGNOSIS — W19XXXA Unspecified fall, initial encounter: Secondary | ICD-10-CM

## 2012-09-02 NOTE — Progress Notes (Signed)
Occupational Therapy Weekly Progress Note  Patient Details  Name: Theresa Lynch MRN: [id number] Date of Birth: Jul 14, 1929  Today's Date: 09/02/2012 Time: 4098-1191 Time Calculation (min): 40 min  Patient has met 5 of 5 short term goals.  Pt continues to make great gains in therapy with working memory, sustained to selective attention, dynamic balance, simple problem solving, awareness of deficits etc. Pt continues to have difficulty with focusing gaze with head turns demonstrating decreased head/eye coordination. This difficulty results in pt requiring min A for dynamic balance especially in the community.   Patient continues to demonstrate the following deficits: muscle weakness, decreased visual motor skills, decreased attention, decreased awareness, decreased problem solving, decreased safety awareness and decreased memory, central origin and decreased balance strategies and difficulty maintaining precautions and therefore will continue to benefit from skilled OT intervention to enhance overall performance with BADL, iADL and Reduce care partner burden.  Patient progressing toward long term goals..  Continue plan of care.  OT Short Term Goals Week 1:  OT Short Term Goal 1 (Week 1): Pt. will maintain sustained attention during dressing enough to don shirt OT Short Term Goal 1 - Progress (Week 1): Met OT Short Term Goal 2 (Week 1): Pt. will don  shirt with SBA OT Short Term Goal 2 - Progress (Week 1): Met OT Short Term Goal 3 (Week 1): Pt. will dresss LB with SBA OT Short Term Goal 3 - Progress (Week 1): Met OT Short Term Goal 4 (Week 1): Pt will be oriented to time with calendar OT Short Term Goal 4 - Progress (Week 1): Met OT Short Term Goal 5 (Week 1): Pt. will transfer to toilet with SBA OT Short Term Goal 5 - Progress (Week 1): Met Week 2:  OT Short Term Goal 1 (Week 2): STG=LTG  Skilled Therapeutic Interventions/Progress Updates:    continue with POC  Therapy  Documentation Precautions:  Precautions Precautions: Fall Precaution Comments: daughter reports no h/o imbalance PTA--unclear why she fell Restrictions Weight Bearing Restrictions: No Pain: Pain Assessment Pain Assessment: No/denies pain  See FIM for current functional status  Therapy/Group: Individual Therapy  Roney Mans [facility] 09/02/2012, 4:26 PM

## 2012-09-02 NOTE — Progress Notes (Signed)
Speech Language Pathology Weekly Progress & Session Notes  Patient Details  Name: Theresa Lynch MRN: [id number] Date of Birth: 1929/02/19  Today's Date: 09/02/2012  Session 1 Time: 1478-2956 Time Calculation (min): 40 min  Session 2 Time: 1345-1425 Time Calculation: 40 min   Short Term Goals: Week 1: SLP Short Term Goal 1 (Week 1): Pt will demonstrate sustained attention to a fucntional task for 2 minutes with Max A verbal and tactile cues for redirection SLP Short Term Goal 1 - Progress (Week 1): Met SLP Short Term Goal 2 (Week 1): Pt will initate functional tasks with Mod A verbal and tactile cues.  SLP Short Term Goal 2 - Progress (Week 1): Met SLP Short Term Goal 3 (Week 1): Pt will utilize call bell to express wants/needs with Max A verbal and question cues. SLP Short Term Goal 3 - Progress (Week 1): Met SLP Short Term Goal 4 (Week 1): Pt will identify 1 physical and 1 cognitive impairment with Max multimodal cueing SLP Short Term Goal 4 - Progress (Week 1): Met SLP Short Term Goal 5 (Week 1): Pt will demonstrate functioanl problem solving for basic and familiar tasks with Mod A verbal cues.  SLP Short Term Goal 5 - Progress (Week 1): Met SLP Short Term Goal 6 (Week 1): Pt will consume trials of regular textures and demonstrate efficient mastication without overt s/s of aspiraton  SLP Short Term Goal 6 - Progress (Week 1): Met  New Short Term Goals:  Week 2: SLP Short Term Goal 1 (Week 2): Pt will demonstrate functional problem solving for basic and familiar tasks with supervision verbal cues.  SLP Short Term Goal 2 (Week 2): Pt will identify 1 physical and 1 cognitive impairment with Mod multimodal cueing SLP Short Term Goal 3 (Week 2): Pt will utilize call bell to express wants/needs with Mod A verbal and question cues. SLP Short Term Goal 4 (Week 2): Pt will initate functional tasks with Min A verbal and tactile cues.  SLP Short Term Goal 5 (Week 2): Pt will demonstrate  sustained attention to a fucntional task for 30 minutes with supervision verbal and tactile cues for redirection  Weekly Progress Updates: Pt has met 6 of 6 STG's this admission due to improvements in sustained attention, working memory, intellectual awareness, safety awareness, functional problem solving and initiation.  Pt requires overall Min A to complete functional tasks due to cognitive deficits.  Pt also demonstrates increased social interaction and overall engagement during therapy sessions. Pt's diet has also been upgraded to regular textures and she requires intermittent supervision for overall swallowing safety. Pt/family education ongoing. Pt would benefit from continued skilled SLP intervention to maximize cognitive recovery and overall functional independence.     SLP Intensity: Minumum of 1-2 x/day, 30 to 90 minutes SLP Frequency: 5 out of 7 days SLP Duration/Estimated Length of Stay: 1 week SLP Treatment/Interventions: Cognitive remediation/compensation;Cueing hierarchy;Dysphagia/aspiration precaution training;Functional tasks;Environmental controls;Internal/external aids;Therapeutic Activities;Patient/family education  Daily Session Session 1: Treatment focus on cognitive goals. SLP facilitated session by providing Min A question cues for functional problem solving/reasoning during a verbal description task. Pt was overall Mod I for word-finding throughout the task and demonstrated sustained attention for ~40 minutes with Min A verbal cues.   Session 2: Treatment focus on cognitive goals. SLP facilitated session by providing Min A question cues for recall of events from previous therapy sessions and for selective attention during a matching task in a mildly distracting environment. Pt independently requested to use the  bathroom while clinician was in the room.   FIM:  Comprehension Comprehension Mode: Auditory Comprehension: 5-Understands basic 90% of the time/requires cueing <  10% of the time Expression Expression: 5-Expresses basic 90% of the time/requires cueing < 10% of the time. Social Interaction Social Interaction: 4-Interacts appropriately 75 - 89% of the time - Needs redirection for appropriate language or to initiate interaction. Problem Solving Problem Solving: 4-Solves basic 75 - 89% of the time/requires cueing 10 - 24% of the time Memory Memory: 4-Recognizes or recalls 75 - 89% of the time/requires cueing 10 - 24% of the time Pain Pain Assessment Pain Assessment: No/denies pain  Therapy/Group: Individual Therapy  Cornelis Kluver 09/02/2012, 4:17 PM

## 2012-09-02 NOTE — Progress Notes (Signed)
Physical Therapy Session Note  Patient Details  Name: Theresa Lynch MRN: [id number] Date of Birth: 06-Dec-1929  Today's Date: 09/02/2012 Time: Treatment Session 1:0930-1025; Treatment Session 2: 1130-1157 ;Treatment Session 3: 4132-4401 Time Calculation (min): Treatment Session 1:55 min; Treatment Session 2: ; Treatment Session 3:   Short Term Goals: Week 2:   LTG=STG  Skilled Therapeutic Interventions/Progress Updates:    Treatment Session 1: 1:1. Focus this tx session on assessment of amb w/ various ADs, curb step negotiation and dynamic standing balance. Pt able to perform grooming at sink w/ (S) and toileting w/ (S) to min guard at start of tx session. Pt able to amb 200' w/ RW and (S), however, req cues for safety to manage RW. Pt also attempted amb x75' w/ SPC, req min A to min guard and consistent v/c for use. Pt did not use AD or HHA during amb throughout remainder of tx session, req close (S) to occasional min A for balance to amb >1000'. Use of AD appears to be cognitively challenging for pt leading to decreased balance at times vs. W/out AD.  Pt further challenged during amb w/ dynamic head turns, walking backwards, speed of amb changes and turns. No over LOB noted, pt req min guard to min A. Pt initially req mod A w/ HHA to negotiate up/down 6" curb step, but pt able to progress w/ multiple repetitions to min A w/out HHA.  Treatment Session 2:  1:1. Focus this tx session on dynamic standing balance and curb step negotiation. Pt's dynamic standing balanced through obstacle course that included stepping over various sized items as well as stepping up/down 4"-6" steps. Pt initially performed course w/ HHA, however, performed all remaining reps w/out AD or HHA and req min-mod A for balance. Obstacle course also repeated with pt stepping sideways to L and R directions. Continued focus on 6" curb step negotiation at end of tx session w/ and w/out HHA, req min A to min guard assist.    Treatment Session 3: 1:1. Focus this tx session on amb endurance, dynamic standing balance and pathfinding. Pt able to amb >1000' from room<>gift shop w/ close (S) to min guard, no AD or HHA.Pt demonstrated minor LOB especially in hallway, but able to self-correct. Pt provided a list of 10 items to find in gift shop, 1/3 of items were the same from previous list, 1/3 were more specific items from the previous list, 1/3 were new items. Pt able to accurately identify which items were from previous list and demonstrated increased ease of finding them vs. Remaining items. Pt req min v/c to re-direct or cues to attend to environment. Pt demonstrated increased ability to attend to obstacles on L side w/out cues. Once on unit floor, pt req min cues to find her room.    Therapy Documentation Precautions:  Precautions Precautions: Fall Precaution Comments: daughter reports no h/o imbalance PTA--unclear why she fell Restrictions Weight Bearing Restrictions: No   Pain: Pain Assessment Pain Assessment: 0-10 Pain Score: 2  Pain Location: Buttocks Pain Orientation: Upper Pain Descriptors / Indicators: Sore  See FIM for current functional status  Therapy/Group: Individual Therapy  Denzil Hughes 09/02/2012, 10:29 AM

## 2012-09-02 NOTE — Progress Notes (Signed)
Recreational Therapy Session Note  Patient Details  Name: Theresa Lynch MRN: [id number] Date of Birth: April 04, 1929 Today's Date: 09/02/2012 Time:  1400-1430 Pain: no c/o Skilled Therapeutic Interventions/Progress Updates: Session focused on selective attention, problem solving & matching during tabletop card game in min distractive environment.  Pt required min cues to complete task.  Therapy/Group: Co-Treatment   Raney Koeppen 09/02/2012, 4:15 PM

## 2012-09-02 NOTE — Progress Notes (Signed)
Patient ID: Theresa Lynch, female   DOB: 1929-08-14, 79 y.o.   MRN: [id number] Subjective/Complaints: No complaints overnight. Oriented to person place and time A 12 point review of systems has been performed and if not noted above is otherwise negative.   Objective: Vital Signs: Blood pressure 159/63, pulse 66, temperature 97.9 F (36.6 C), temperature source Oral, resp. rate 17, height 5\' 6"  (1.676 m), weight 66.3 kg (146 lb 2.6 oz), SpO2 99.00%. No results found.  Recent Labs  09/01/12 0627  WBC 7.1  HGB 10.3*  HCT 31.6*  PLT 576*   No results found for this basename: NA, K, CL, CO, GLUCOSE, BUN, CREATININE, CALCIUM,  in the last 72 hours CBG (last 3)  No results found for this basename: GLUCAP,  in the last 72 hours  Wt Readings from Last 3 Encounters:  08/28/12 66.3 kg (146 lb 2.6 oz)  08/20/12 67.5 kg (148 lb 13 oz)  08/14/12 64.9 kg (143 lb 1.3 oz)     CBG (last 3)  No results found for this basename: GLUCAP,  in the last 72 hours  Wt Readings from Last 3 Encounters:  08/28/12 66.3 kg (146 lb 2.6 oz)  08/20/12 67.5 kg (148 lb 13 oz)  08/14/12 64.9 kg (143 lb 1.3 oz)    Physical Exam:  Constitutional: She appears well-developed and well-nourished.  Right facial ecchymoses, wounds all showing improvement. Nasal bridge with abrasions with ecchymosis. Right forehead laceration. All areas healing nicely.  HENT: THRUSH on tongue Head: Normocephalic.  Eyes: Pupils are equal, round, and reactive to light. Right eye exhibits discharge.  Neck: Normal range of motion. Neck supple.  Cardiovascular: Normal rate.  Pulmonary/Chest: Effort normal and breath sounds normal.  Abdominal: Soft. Bowel sounds are normal. She exhibits no distension. There is no tenderness.  Neurological: She is alert.  Very alert. Oriented to person and place, time Speech clear but slow with delayed processing persistent. Mild expressive deficits as well.   Has receptive language and processing  deficits as well.. Delayed and often Unable to follow 2 step commands. perseveration with motor (especially with LUE) and verbal apraxia noted. BLE with ataxic movements.  Skin: Skin is warm and dry. Large bruise right lateral breast area   Assessment/Plan: 1. 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 Physiatrist is providing close team supervision and 24 hour management of active medical problems listed below. Physiatrist and rehab team continue to assess barriers to discharge/monitor patient progress toward functional and medical goals.  FIM: FIM - Bathing Bathing Steps Patient Completed: Chest;Right Arm;Left Arm;Buttocks;Right upper leg;Left upper leg;Abdomen;Right lower leg (including foot);Left lower leg (including foot);Front perineal area Bathing: 5: Supervision: Safety issues/verbal cues  FIM - Upper Body Dressing/Undressing Upper body dressing/undressing steps patient completed: Thread/unthread right sleeve of pullover shirt/dresss;Thread/unthread left sleeve of pullover shirt/dress;Put head through opening of pull over shirt/dress;Pull shirt over trunk Upper body dressing/undressing: 5: Supervision: Safety issues/verbal cues FIM - Lower Body Dressing/Undressing Lower body dressing/undressing steps patient completed: Thread/unthread right underwear leg;Thread/unthread right pants leg;Thread/unthread left underwear leg;Thread/unthread left pants leg;Pull underwear up/down;Pull pants up/down;Fasten/unfasten pants;Don/Doff right sock;Don/Doff left sock;Don/Doff right shoe (elected not to wear shoes) Lower body dressing/undressing: 5: Supervision: Safety issues/verbal cues  FIM - Toileting Toileting steps completed by patient: Adjust clothing prior to toileting;Performs perineal hygiene;Adjust clothing after toileting Toileting Assistive  Devices: Grab bar or rail for support Toileting: 5:  Supervision: Safety issues/verbal cues  FIM - Archivist Transfers Assistive Devices: Grab bars Toilet Transfers: 5-To toilet/BSC: Supervision (verbal cues/safety issues);5-From toilet/BSC: Supervision (verbal cues/safety issues)  FIM - Press photographer Assistive Devices: Bed rails Bed/Chair Transfer: 5: Supine > Sit: Supervision (verbal cues/safety issues);5: Sit > Supine: Supervision (verbal cues/safety issues);4: Chair or W/C > Bed: Min A (steadying Pt. > 75%);4: Bed > Chair or W/C: Min A (steadying Pt. > 75%)  FIM - Locomotion: Wheelchair Distance: 100', 50' Locomotion: Wheelchair: 0: Activity did not occur FIM - Locomotion: Ambulation Locomotion: Ambulation Assistive Devices: Walker - Rolling;Cane Biochemist, clinical (comment) (none) Ambulation/Gait Assistance: 4: Min assist Locomotion: Ambulation: 4: Travels 150 ft or more with minimal assistance (Pt.>75%)  Comprehension Comprehension Mode: Auditory Comprehension: 4-Understands basic 75 - 89% of the time/requires cueing 10 - 24% of the time  Expression Expression Mode: Verbal Expression: 4-Expresses basic 75 - 89% of the time/requires cueing 10 - 24% of the time. Needs helper to occlude trach/needs to repeat words.  Social Interaction Social Interaction: 4-Interacts appropriately 75 - 89% of the time - Needs redirection for appropriate language or to initiate interaction.  Problem Solving Problem Solving: 3-Solves basic 50 - 74% of the time/requires cueing 25 - 49% of the time  Memory Memory: 3-Recognizes or recalls 50 - 74% of the time/requires cueing 25 - 49% of the time  Medical Problem List and Plan:  1. DVT Prophylaxis/Anticoagulation: Mechanical: Sequential compression devices, below knee Bilateral lower extremities  2. Pain Management: tylenol prn for headaches.  3. H/o depression/anxiety/claustophobia: -appreciate Neuropsych  recs  -will increase paxil and add low dose prn xanax for anxiety, seems to be helping 4. Neuropsych: This patient is not capable of making decisions on her own behalf.  5. Leucocytosis: Sinusitis noted on Xrays.afebrile no URI symptoms Urine cx neg  -cath tip culture neg, monitor temp and CBC 6. ABLA: hgb 10. Reactive increase in platetets 7. Dizziness  : likely vestibular component. Therapy attempting to work with and through 7. HTN: will monitor with bid checks. Continue HCTZ 9. Hypokalemia/FEN- continue current rx 10. Insomnia: scheduled trazodone qhs helpful   LOS (Days) 10 A FACE TO FACE EVALUATION WAS PERFORMED  Erick Colace 09/02/2012 10:36 AM

## 2012-09-02 NOTE — Progress Notes (Signed)
Occupational Therapy Session Note  Patient Details  Name: KELLYANN ORDWAY MRN: [id number] Date of Birth: 08-Aug-1929  Today's Date: 09/02/2012 Time: 4098-1191 Time Calculation (min): 45 min  Short Term Goals: Week 1:  OT Short Term Goal 1 (Week 1): Pt. will maintain sustained attention during dressing enough to don shirt OT Short Term Goal 2 (Week 1): Pt. will don  shirt with SBA OT Short Term Goal 3 (Week 1): Pt. will dresss LB with SBA OT Short Term Goal 4 (Week 1): Pt will be oriented to time with calendar OT Short Term Goal 5 (Week 1): Pt. will transfer to toilet with SBA  Skilled Therapeutic Interventions/Progress Updates:    1:1 self care retraining at shower level today with attention for pt being able to set herself up, simple problem solving with extra time, attention to left with subtle cues (needed min cuing to find hand held shower head she had placed next to her on the seat on her left.) Pt still c/o dizziness with bed mobility and quickly coming into sitting from supine to the left side of bed. Pt able to bath with distant supervision with questioning cues. Performed dynamic balance with functional ambulation down the hallway and back with attention to head turns to read room numbers and locate pink signs. Pt did have LOB and becoming off balance with head turns to left; however improved since admission. Pt did require min A for dynamic balance with head turns.   Therapy Documentation Precautions:  Precautions Precautions: Fall Precaution Comments: daughter reports no h/o imbalance PTA--unclear why she fell Restrictions Weight Bearing Restrictions: No Pain: No c/o pain  See FIM for current functional status  Therapy/Group: Individual Therapy  Roney Mans [facility] 09/02/2012, 11:25 AM

## 2012-09-03 ENCOUNTER — Inpatient Hospital Stay (HOSPITAL_COMMUNITY): Payer: Medicare Other | Admitting: Speech Pathology

## 2012-09-03 ENCOUNTER — Encounter (HOSPITAL_COMMUNITY): Payer: Medicare Other | Admitting: Occupational Therapy

## 2012-09-03 ENCOUNTER — Inpatient Hospital Stay (HOSPITAL_COMMUNITY): Payer: Medicare Other | Admitting: *Deleted

## 2012-09-03 ENCOUNTER — Inpatient Hospital Stay (HOSPITAL_COMMUNITY): Payer: Medicare Other

## 2012-09-03 NOTE — Progress Notes (Signed)
Speech Language Pathology Daily Session Notes  Patient Details  Name: Theresa Lynch MRN: [id number] Date of Birth: 02/27/1929  Today's Date: 09/03/2012  Session 1 Time: 8295-6213 Time Calculation (min): 45 min  Session 2 Time: 1345-1440 Time Calculation: 55 minutes  Short Term Goals: Week 2: SLP Short Term Goal 1 (Week 2): Pt will demonstrate functional problem solving for basic and familiar tasks with supervision verbal cues.  SLP Short Term Goal 2 (Week 2): Pt will identify 1 physical and 1 cognitive impairment with Mod multimodal cueing SLP Short Term Goal 3 (Week 2): Pt will utilize call bell to express wants/needs with Mod A verbal and question cues. SLP Short Term Goal 4 (Week 2): Pt will initate functional tasks with Min A verbal and tactile cues.  SLP Short Term Goal 5 (Week 2): Pt will demonstrate sustained attention to a fucntional task for 30 minutes with supervision verbal and tactile cues for redirection  Skilled Therapeutic Interventions:  Session 1: Treatment session focus on cognitive goals. SLP facilitated session by providing Min A question and semantic cues for intellectual awareness into cognitive and physical deficits and Mod A question cues for reasoning/judgment in regards to tasks she will require assistance with at home.  SLP also provided a list of pt's current medications and pt required Max A verbal and question cues to recall their functions.    Session 2: Treatment focus on cognitive goals. SLP facilitated session by providing Min A verbal and visual cues for organization of information in a mildly complex problem solving task with balancing a checkbook. Pt also required Max question and visual cues for problem solving throughout the task. Pt demonstrated increased emergent awareness into difficulty of task and as to why she will require assistance at discharge. Pt also participated in 6 step sequencing task with picture cards and required Mod visual and question  cues to complete.   FIM:  Comprehension Comprehension Mode: Auditory Comprehension: 5-Understands basic 90% of the time/requires cueing < 10% of the time Expression Expression Mode: Verbal Expression: 5-Expresses basic 90% of the time/requires cueing < 10% of the time. Social Interaction Social Interaction: 4-Interacts appropriately 75 - 89% of the time - Needs redirection for appropriate language or to initiate interaction. Problem Solving Problem Solving: 4-Solves basic 75 - 89% of the time/requires cueing 10 - 24% of the time Memory Memory: 4-Recognizes or recalls 75 - 89% of the time/requires cueing 10 - 24% of the time  Pain Pain Assessment Pain Assessment: No/denies pain  Therapy/Group: Individual Therapy  Theresa Lynch 09/03/2012, 3:47 PM

## 2012-09-03 NOTE — Progress Notes (Signed)
Physical Therapy Session Note  Patient Details  Name: Theresa Lynch MRN: [id number] Date of Birth: 1929-12-31  Today's Date: 09/03/2012   Session #1: Time: 4098-1191 Time Calculation (min): 58 min Individual therapy; Denies pain. Co-treat with recreational therapy with focus on obstacle negotiation, walking on compliant surface, walking outdoors on uneven surfaces in community setting to [employer], cognitive/memory tasks, scanning environment during gait, and dynamic gait activity tossing and catching ball while walking back to her room. Pt with minimal LOB's but able to correct with physical assist needed. Overall close S for all tasks. Reviewed session upon returning to room to document on her schedule to aid with memory with pt requiring cues to recall tasks completed.   Session #2: Time: 1130-1155 (25 min) Individual therapy; Denies pain. Session focused on functional ambulation on unit, simulated car transfer, and dynamic balance/gait activity in apartment to put fresh sheets on the bed including carrying pillows and sheets across the room. Pt verbalized her surprise at how fatigued she was after making the bed. Pt completed all tasks with S; cues at times for safety and to attend to the L (ex. When nearing the wall during the turn). Pt with 1 episode of LOB requiring min A to recover when turning to the L on the way back to her room.   Therapy Documentation Precautions:  Precautions Precautions: Fall Precaution Comments: daughter reports no h/o imbalance PTA--unclear why she fell Restrictions Weight Bearing Restrictions: No  See FIM for current functional status  Therapy/Group: Individual Therapy  Karolee Stamps [facility] 09/03/2012, 12:06 PM

## 2012-09-03 NOTE — Progress Notes (Signed)
Occupational Therapy Session Note  Patient Details  Name: Theresa Lynch MRN: [id number] Date of Birth: 1929/04/26  Today's Date: 09/03/2012 Time: 4098-1191 Time Calculation (min): 45 min  Short Term Goals: Week 2:  OT Short Term Goal 1 (Week 2): STG=LTG  Skilled Therapeutic Interventions/Progress Updates:    Pt declined to bathe today or changed clothes, even though she had breakfast syrup on her shirt. Pt made decision to remain in clothes she wore yesterday and slept in. Pt reported she just wanted to be lazy today. Agreed to perform all tasks for Grad day tomorrow. Ambulated down to the gym and perform a floor transfer with min cuing for sequence and went over fall recovery. Also continued to work on head/ eye coordination with gaze stabilization with exercises and functional ambulation down hall way with head turns and bouncing a ball down the hallway.   Therapy Documentation Precautions:  Precautions Precautions: Fall Precaution Comments: daughter reports no h/o imbalance PTA--unclear why she fell Restrictions Weight Bearing Restrictions: No Pain: No c/o pain  See FIM for current functional status  Therapy/Group: Individual Therapy  Roney Mans [facility] 09/03/2012, 1:17 PM

## 2012-09-03 NOTE — Progress Notes (Signed)
Physical Therapy Session Note  Patient Details  Name: Theresa Lynch MRN: [id number] Date of Birth: [birth year]  Today's Date: 09/03/2012 Time: 4098-1191 Time Calculation (min): 30 min  Short Term Goals: Week 1:   LTG=STG  Skilled Therapeutic Interventions/Progress Updates:    1:1. Family available this tx session to initiate family training. Pt able to amb >700' w/ close (S) throughout tx session, no LOB noted. Pt's daughter and husband educated on performance of safe car transfers and curb step negotiation with pt. Pt's daughter physically demonstrated safe performance of activities with pt, pt req (S)-min A for car t/f and min A for curb step negotiation. Family also educated regarding benefits of f/u OP PT. Further family training to be performed tomorrow.   Therapy Documentation Precautions:  Precautions Precautions: Fall Precaution Comments: daughter reports no h/o imbalance PTA--unclear why she fell Restrictions Weight Bearing Restrictions: No  See FIM for current functional status  Therapy/Group: Individual Therapy  Denzil Hughes 09/03/2012, 4:08 PM

## 2012-09-03 NOTE — Progress Notes (Signed)
Recreational Therapy Session Note  Patient Details  Name: BERENIZE GATLIN MRN: [id number] Date of Birth: April 01, 1929 Today's Date: 09/03/2012 Time:  [phone] Pain: no c/o Skilled Therapeutic Interventions/Progress Updates: Session focused on activity tolerance, ambulation without AD, identification & negotiation of obstacles, & memory during out and about activity to hospital farmer's market.  Pt required close supervision for all tasks.  Pt ambulated while tossing/catching a ball from gym to room with close supervision.  Once returned to the room, pt required min questioning cues to recall session events to document on her schedule to aid with memory.  Therapy/Group: Co-Treatment   Sula Fetterly 09/03/2012, 1:30 PM

## 2012-09-03 NOTE — Progress Notes (Signed)
Patient ID: Theresa Lynch, female   DOB: [birth year], 79 y.o.   MRN: [id number] Subjective/Complaints: Slept well. Denies pain. In good spirits today. Excited about going home A 12 point review of systems has been performed and if not noted above is otherwise negative.   Objective: Vital Signs: Blood pressure 130/79, pulse 60, temperature 98.2 F (36.8 C), temperature source Oral, resp. rate 18, height 5\' 6"  (1.676 m), weight 66.3 kg (146 lb 2.6 oz), SpO2 95.00%. No results found.  Recent Labs  09/01/12 0627  WBC 7.1  HGB 10.3*  HCT 31.6*  PLT 576*   No results found for this basename: NA, K, CL, CO, GLUCOSE, BUN, CREATININE, CALCIUM,  in the last 72 hours CBG (last 3)  No results found for this basename: GLUCAP,  in the last 72 hours  Wt Readings from Last 3 Encounters:  08/28/12 66.3 kg (146 lb 2.6 oz)  08/20/12 67.5 kg (148 lb 13 oz)  08/14/12 64.9 kg (143 lb 1.3 oz)     CBG (last 3)  No results found for this basename: GLUCAP,  in the last 72 hours  Wt Readings from Last 3 Encounters:  08/28/12 66.3 kg (146 lb 2.6 oz)  08/20/12 67.5 kg (148 lb 13 oz)  08/14/12 64.9 kg (143 lb 1.3 oz)    Physical Exam:  Constitutional: She appears well-developed and well-nourished.  Right facial ecchymoses, wounds all showing improvement. Nasal bridge with abrasions with ecchymosis. Right forehead laceration. All areas healing nicely.  HENT: THRUSH on tongue Head: Normocephalic.  Eyes: Pupils are equal, round, and reactive to light. Right eye exhibits discharge.  Neck: Normal range of motion. Neck supple.  Cardiovascular: Normal rate.  Pulmonary/Chest: Effort normal and breath sounds normal.  Abdominal: Soft. Bowel sounds are normal. She exhibits no distension. There is no tenderness.  Neurological: She is alert.  Very alert. Oriented to person and place, time Speech clear but slow with delayed processing persistent--a little better.. Mild expressive deficits as well.   Has  receptive language and processing deficits as well.. Delayed and often Unable to follow 2 step commands. perseveration with motor (especially with LUE) and verbal apraxia noted. BLE with ataxic movements.  Skin: Skin is warm and dry. Large bruise right lateral breast area   Assessment/Plan: 1. 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 Physiatrist is providing close team supervision and 24 hour management of active medical problems listed below. Physiatrist and rehab team continue to assess barriers to discharge/monitor patient progress toward functional and medical goals.  FIM: FIM - Bathing Bathing Steps Patient Completed: Chest;Right Arm;Left Arm;Buttocks;Right upper leg;Left upper leg;Abdomen;Right lower leg (including foot);Left lower leg (including foot);Front perineal area Bathing: 5: Supervision: Safety issues/verbal cues  FIM - Upper Body Dressing/Undressing Upper body dressing/undressing steps patient completed: Thread/unthread right sleeve of pullover shirt/dresss;Thread/unthread left sleeve of pullover shirt/dress;Put head through opening of pull over shirt/dress;Pull shirt over trunk Upper body dressing/undressing: 5: Supervision: Safety issues/verbal cues FIM - Lower Body Dressing/Undressing Lower body dressing/undressing steps patient completed: Thread/unthread right underwear leg;Thread/unthread right pants leg;Thread/unthread left underwear leg;Thread/unthread left pants leg;Pull underwear up/down;Pull pants up/down;Fasten/unfasten pants;Don/Doff right sock;Don/Doff left sock;Don/Doff right shoe (elected not to wear shoes) Lower body dressing/undressing: 5: Supervision: Safety issues/verbal cues  FIM - Toileting Toileting steps completed by patient: Adjust clothing prior to toileting;Performs perineal hygiene;Adjust clothing after  toileting Toileting Assistive Devices: Grab bar or  rail for support Toileting: 5: Supervision: Safety issues/verbal cues  FIM - Diplomatic Services operational officer Devices: Grab bars Toilet Transfers: 5-To toilet/BSC: Supervision (verbal cues/safety issues);5-From toilet/BSC: Supervision (verbal cues/safety issues)  FIM - Press photographer Assistive Devices: Bed rails Bed/Chair Transfer: 5: Supine > Sit: Supervision (verbal cues/safety issues);5: Sit > Supine: Supervision (verbal cues/safety issues);4: Chair or W/C > Bed: Min A (steadying Pt. > 75%);4: Bed > Chair or W/C: Min A (steadying Pt. > 75%)  FIM - Locomotion: Wheelchair Distance: 100', 50' Locomotion: Wheelchair: 0: Activity did not occur FIM - Locomotion: Ambulation Locomotion: Ambulation Assistive Devices: Walker - Rolling;Cane Biochemist, clinical (comment) (none) Ambulation/Gait Assistance: 4: Min assist Locomotion: Ambulation: 4: Travels 150 ft or more with minimal assistance (Pt.>75%)  Comprehension Comprehension Mode: Auditory Comprehension: 5-Understands basic 90% of the time/requires cueing < 10% of the time  Expression Expression Mode: Verbal Expression: 5-Expresses basic 90% of the time/requires cueing < 10% of the time.  Social Interaction Social Interaction: 4-Interacts appropriately 75 - 89% of the time - Needs redirection for appropriate language or to initiate interaction.  Problem Solving Problem Solving: 4-Solves basic 75 - 89% of the time/requires cueing 10 - 24% of the time  Memory Memory: 4-Recognizes or recalls 75 - 89% of the time/requires cueing 10 - 24% of the time  Medical Problem List and Plan:  1. DVT Prophylaxis/Anticoagulation: Mechanical: Sequential compression devices, below knee Bilateral lower extremities  2. Pain Management: tylenol prn for headaches.  3. H/o depression/anxiety/claustophobia: -appreciate Neuropsych recs  -increased paxil and add low dose prn  xanax for anxiety, seems to be helping 4. Neuropsych: This patient is not capable of making decisions on her own behalf.  5. Leucocytosis: Sinusitis noted on Xrays.afebrile no URI symptoms Urine cx neg  -cath tip culture neg, monitor temp and CBC 6. ABLA: hgb 10. Reactive increase in platetets 7. Dizziness  : likely vestibular component. Therapy attempting to work with and through 7. HTN: will monitor with bid checks. Continue HCTZ 9. Hypokalemia/FEN- continue current rx 10. Insomnia: scheduled trazodone qhs helpful   LOS (Days) 11 A FACE TO FACE EVALUATION WAS PERFORMED  Jo-Ann Johanning T 09/03/2012 8:28 AM

## 2012-09-04 ENCOUNTER — Inpatient Hospital Stay (HOSPITAL_COMMUNITY): Payer: Medicare Other | Admitting: Physical Therapy

## 2012-09-04 ENCOUNTER — Inpatient Hospital Stay (HOSPITAL_COMMUNITY): Payer: Medicare Other | Admitting: Speech Pathology

## 2012-09-04 ENCOUNTER — Inpatient Hospital Stay (HOSPITAL_COMMUNITY): Payer: Medicare Other

## 2012-09-04 ENCOUNTER — Encounter (HOSPITAL_COMMUNITY): Payer: Medicare Other | Admitting: Occupational Therapy

## 2012-09-04 DIAGNOSIS — W19XXXA Unspecified fall, initial encounter: Secondary | ICD-10-CM

## 2012-09-04 DIAGNOSIS — S069X9A Unspecified intracranial injury with loss of consciousness of unspecified duration, initial encounter: Secondary | ICD-10-CM

## 2012-09-04 DIAGNOSIS — E876 Hypokalemia: Secondary | ICD-10-CM

## 2012-09-04 NOTE — Progress Notes (Signed)
Physical Therapy Session Note  Patient Details  Name: Theresa Lynch MRN: [id number] Date of Birth: 22-Jul-1929  Today's Date: 09/04/2012 Time: 4098-1191 Time Calculation (min): 45 min  Short Term Goals: Week 1:     Skilled Therapeutic Interventions/Progress Updates:    pt notes minimal pain today.  Sit to stand with S.  Ambulated >1000' with S working on pathfinding and reading signs.  Pt with occasional side steps during cognitive tasks, but no LOB.  Pt needed Mod-Max cueing for reading signs and finding room numbers and the stairwell.  Amb up/down >20 stairs with S and one rail.  Returned to sitting with S.    Therapy Documentation Precautions:  Precautions Precautions: Fall Precaution Comments: daughter reports no h/o imbalance PTA--unclear why she fell Restrictions Weight Bearing Restrictions: No  See FIM for current functional status  Therapy/Group: Individual Therapy  Dredyn Gubbels, Alison Murray 09/04/2012, 12:29 PM

## 2012-09-04 NOTE — Progress Notes (Signed)
Occupational Therapy Session Note  Patient Details  Name: ILISA HAYWORTH MRN: [id number] Date of Birth: [birth year]  Today's Date: 09/04/2012 Time: 1130-1200 Time Calculation (min): 30 min  Short Term Goals: Week 2:  OT Short Term Goal 1 (Week 2): STG=LTG  Skilled Therapeutic Interventions: Therapeutic activity with emphasis on functional balance during mobility, attention to right, therex to improve trunk rotation during turns while ambulating, patient ed on symptoms of disinhibition, and compensatory strategies for mild peripheral vision deficit (right).   Patient was wearing her bra this date and confirmed her need to attend to all aspects of grooming and hygiene as prior to admission.  During functional mobility, patient required moderate cueing to attend to right visual field as compared to left, with cues provided laterally from behind her as she walked forward.   During turns in corridor patient also demos rigidity at trunk/pelvis and was advised on need for daily trunk stretches but she declined offer for written instruction to review later.  Patient performed 5 min of general conditioning, bil UE/LE, using NuStep, level 1, and reported mild exertion.  Patient escorted back to her room for noon meal, seated in w/c, with safety belt.  RN notified.     Therapy Documentation Precautions:  Precautions Precautions: Fall Precaution Comments: daughter reports no h/o imbalance PTA--unclear why she fell Restrictions Weight Bearing Restrictions: No  Pain: Pain Assessment Pain Assessment: No/denies pain  Other Treatments:    See FIM for current functional status  Therapy/Group: Individual Therapy  Georgeanne Nim 09/04/2012, 12:17 PM

## 2012-09-04 NOTE — Progress Notes (Signed)
Social Work Patient ID: Theresa Lynch, female   DOB: [birth year], 79 y.o.   MRN: [id number]   Reviewed team conference information with pt and family.  Family education being completed today and ready for d/c tomorrow.  Have arranged OP therapy follow up.  Family has hired Engineering geologist caregivers.  No further needs.  [name], LCSW

## 2012-09-04 NOTE — Progress Notes (Signed)
Occupational Therapy Discharge Summary  Patient Details  Name: Theresa Lynch MRN: [id number] Date of Birth: Jul 30, 1929  Today's Date: 09/04/2012 Time: 4098-1191 Time Calculation (min): 40 min  Grad day: 1:1 self care retraining at shower level with focus on dynamic balance with functional ambulation. Pt with poor motivation for the things that used to be important to her ie self care tasks. Pt reported no dizziness with functional mobility.   2nd session  13:00-13:30 no c/o pain   focus on family education with pt's husband and daughter on d/c planning including importance of routine, participating in daily ADL and IADL tasks, simple problem solving, vestibular follow up rehab, requiring supervision at home and min A for community mobility, left side inattention.   Patient has met 10 of 10 long term goals due to improved activity tolerance, improved balance, postural control, ability to compensate for deficits, functional use of  RIGHT upper, RIGHT lower, LEFT upper and LEFT lower extremity, improved attention, improved awareness and improved coordination.  Patient to discharge at overall Supervision level.  Patient's care partner is independent to provide the necessary physical and cognitive assistance at discharge.    Reasons goals not met: n/a  Recommendation:  Patient will benefit from ongoing skilled OT services in outpatient setting to continue to advance functional skills in the area of BADL and iADL.  Equipment: shower chair  Reasons for discharge: treatment goals met and discharge from hospital  Patient/family agrees with progress made and goals achieved: Yes  OT Discharge Precautions/Restrictions  Precautions Precautions: Fall Precaution Comments: daughter reports no h/o imbalance PTA--unclear why she fell Restrictions Weight Bearing Restrictions: No Pain  no c/o pain  ADL  see FIM Vision/Perception  Vision - History Baseline Vision: Wears glasses only for  reading Visual History: Glaucoma;Cataracts Patient Visual Report: No change from baseline Vision - Assessment Eye Alignment: Within Functional Limits Vision Assessment: Vision tested Ocular Range of Motion: Within Functional Limits Alignment/Gaze Preference: Within Defined Limits Tracking/Visual Pursuits: Able to track stimulus in all quads without difficulty Saccades: Decreased speed of saccadic movement Convergence: Within functional limits Visual Fields: Other (comment) (improved attention to perherial) Perception Inattention/Neglect:  (improved - tends to stay to the left - but improved attentio) Praxis Praxis: Intact  Cognition Arousal/Alertness: Awake/alert Orientation Level: Oriented X4 Attention: Selective Sustained Attention: Appears intact Selective Attention: Appears intact Memory: Impaired Memory Impairment: Decreased recall of new information Awareness: Impaired Awareness Impairment: Anticipatory impairment Problem Solving: Impaired Problem Solving Impairment: Functional complex Executive Function: Reasoning Reasoning: Impaired Reasoning Impairment: Functional complex Sequencing: Impaired Sequencing Impairment: Functional complex Organizing: Impaired Organizing Impairment: Functional complex Initiating: Appears intact Safety/Judgment: Impaired [place] Scales of Cognitive Functioning: Automatic/appropriate Sensation Sensation Light Touch: Appears Intact Proprioception: Appears Intact Coordination Gross Motor Movements are Fluid and Coordinated: Yes Fine Motor Movements are Fluid and Coordinated: Yes Motor  Motor Motor: Within Functional Limits Mobility  Bed Mobility Supine to Sit: 6: Modified independent (Device/Increase time) Transfers Sit to Stand: 5: Supervision Stand to Sit: 5: Supervision  Trunk/Postural Assessment  Cervical Assessment Cervical Assessment: Within Functional Limits Thoracic Assessment Thoracic Assessment: Within  Functional Limits Lumbar Assessment Lumbar Assessment: Within Functional Limits Postural Control Postural Control: Within Functional Limits  Balance Static Sitting Balance Static Sitting - Level of Assistance: 7: Independent Dynamic Sitting Balance Dynamic Sitting - Level of Assistance: 6: Modified independent (Device/Increase time) Static Standing Balance Static Standing - Level of Assistance: 5: Stand by assistance Dynamic Standing Balance Dynamic Standing - Level of Assistance: 5: Stand by  assistance;4: Min assist Extremity/Trunk Assessment RUE Assessment RUE Assessment: Within Functional Limits LUE Assessment LUE Assessment: Within Functional Limits  See FIM for current functional status  Roney Mans [facility] 09/04/2012, 11:25 AM

## 2012-09-04 NOTE — Progress Notes (Signed)
Physical Therapy Discharge Summary  Patient Details  Name: Theresa Lynch MRN: [id number] Date of Birth: 14-Feb-1929  Today's Date: 09/04/2012 Time: 1430-1530 Time Calculation (min): 60 min  Patient has met 10 of 10 long term goals due to improved activity tolerance, improved balance, increased strength, improved attention, improved awareness and improved coordination.  Patient to discharge at an ambulatory level Supervision to Min A.   Patient's care partner, daughter Darl Pikes, is independent to provide the necessary physical and cognitive assistance at discharge.  Reasons goals not met: N/A all goals met.  Recommendation:  Patient will benefit from ongoing skilled PT services in outpatient setting to continue to advance safe functional mobility, address ongoing impairments in decreased balance, decreased awareness of L side of environment and balance impairments, decreased functional endurance, and minimize fall risk.  Equipment: No equipment provided  Reasons for discharge: treatment goals met  Patient/family agrees with progress made and goals achieved: Yes  PT Discharge Precautions/Restrictions Precautions Precautions: Fall Precaution Comments: daughter reports no h/o imbalance PTA--unclear why she fell Restrictions Weight Bearing Restrictions: No Pain Pain Assessment Pain Assessment: No/denies pain Vision/Perception  Vision - History Baseline Vision: Wears glasses only for reading Visual History: Glaucoma;Cataracts Patient Visual Report: No change from baseline Perception Inattention/Neglect: Other (comment) (Pt w/ decreased attention to L side, but improved) Praxis Praxis: Intact  Cognition Overall Cognitive Status: Impaired/Different from baseline Arousal/Alertness: Awake/alert Orientation Level: Oriented X4 Memory: Impaired Memory Impairment: Decreased recall of new information Decreased Short Term Memory: Verbal basic;Functional basic Awareness:  Impaired Awareness Impairment: Anticipatory impairment Problem Solving: Impaired Problem Solving Impairment: Functional complex Initiating: Appears intact Self Correcting: Impaired Safety/Judgment: Impaired Sensation Sensation Light Touch: Appears Intact Proprioception: Appears Intact Coordination Gross Motor Movements are Fluid and Coordinated: Yes Fine Motor Movements are Fluid and Coordinated: Yes Motor  Motor Motor: Within Functional Limits  Mobility Bed Mobility Bed Mobility: Supine to Sit;Sit to Supine Supine to Sit: 7: Independent Sit to Supine: 7: Independent Transfers Sit to Stand: 5: Supervision Stand to Sit: 5: Supervision Stand Pivot Transfers: 5: Supervision Locomotion  Ambulation Ambulation: Yes Ambulation/Gait Assistance: 4: Min assist Assistive device: None Ambulation/Gait Assistance Details: Verbal cues for precautions/safety Gait Gait Pattern: Within Functional Limits High Level Ambulation High Level Ambulation: Direction changes;Sudden stops;Head turns Direction Changes: LOB ~10% of L turns, especially when they are quick, however, able to self-correct majority of time Sudden Stops: (S) Head Turns: close (S) for vertical turns, min A for horizontal turns Stairs / Additional Locomotion Stairs: Yes Stairs Assistance: 5: Supervision Stairs Assistance Details: Verbal cues for precautions/safety Stair Management Technique: Other (comment);One rail Left;Two rails;Alternating pattern (HHA) Number of Stairs: 8 Curb: 4: Min Technical sales engineer Mobility: No  Trunk/Postural Assessment  Postural Control Postural Control: Within Functional Limits  Balance Static Sitting Balance Static Sitting - Level of Assistance: 7: Independent Dynamic Sitting Balance Dynamic Sitting - Level of Assistance: 6: Modified independent (Device/Increase time) Dynamic Sitting - Balance Activities: Reaching for objects Static Standing Balance Static Standing  - Balance Support: No upper extremity supported Static Standing - Level of Assistance: 5: Stand by assistance Dynamic Standing Balance Dynamic Standing - Balance Support: No upper extremity supported Dynamic Standing - Level of Assistance: 5: Stand by assistance Dynamic Standing - Balance Activities: Reaching across midline;Reaching for objects Extremity Assessment      RLE Assessment RLE Assessment: Within Functional Limits RLE Strength Right Hip Flexion: 3+/5 Right Knee Flexion: 4/5 Right Knee Extension: 4/5 Right Ankle Dorsiflexion: 4/5 Right Ankle Plantar Flexion: 4/5  LLE Assessment LLE Assessment: Within Functional Limits LLE Strength Left Hip Flexion: 4/5 Left Knee Flexion: 4/5 Left Ankle Dorsiflexion: 4/5 Left Ankle Plantar Flexion: 4/5  Skilled Therapeutic Interventions 1:1. Focus this tx session on discharge assessment as well as family training. See objective findings above. Pt able to toilet w/ overall (S) at start of tx session. Berg Balance Test re-performed. Pt scored  45/56, which demonstrates pt's improved dynamic standing balance from original test score of 32/56. Pt's daughter and husband educated on and pt's daughter provided safe demonstration of bed mobility in a standard bed, car t/f, curb and stair negotiation as well as long distance amb in busy enviornments with pt. Pt also challenged with dynamic head turns, speed changes, quick direction changes and walking on grass outside. Pt demonstrated 1 minor LOB while attempting to look at a display, but able to self-correct. Also, pt w/ no reports of dizziness, especially during quick L turns. Pt's daughter verbalizes understanding of pt's remaining balance impairments as well as pt's need for min A especially when in community environments and stair negotiation for improved safety. Pt able to amb >1000' this tx session w/ (S)-min A x1person provided during all functional mobility.  See FIM for current functional  status  Denzil Hughes 09/05/2012, 7:47 AM
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
## 2012-09-04 NOTE — Progress Notes (Signed)
Speech Language Pathology Session Notes & Discharge Summary  Patient Details  Name: Theresa Lynch MRN: [id number] Date of Birth: October 30, 1929  Today's Date: 09/04/2012  Session 1 Time: 4098-1191 Time Calculation (min): 45 min  Session 2 Time: 4782-9562 Time Calculation: 30 min  Skilled Therapeutic Intervention:   Session 1: Treatment focus on cognitive goals. SLP facilitated session by providing extra time and Min verbal cues for encouragement to perform grooming tasks. Pt performed tasks with Mod I. Pt followed 2 step commands with 100% accuracy and 3 step commands with 66% accuracy. Pt could answer complex yes/no questions with 90% accuracy but was only 40% accurate for comprehension of information at the paragraph level, suspect function was impacted by attention and recall with task. Pt also performed written expression and reading comprehension tasks with Mod I.   Session 2: Treatment focus on family education in regards to pt's current cognitive function and strategies to utilize at home to increase working memory, functional problem solving, awareness and overall safety. Pt's family verbalized and demonstrated understanding of all information. Handouts were also given to reinforce information. All questions answered at this time.    Patient has met 8 of 8 long term goals.  Patient to discharge at overall Supervision;Min level.   Reasons goals not met: N/A   Clinical Impression/Discharge Summary: Pt has made excellent progress and has met 8 of 8 LTG's this admission.  Currently, pt is demonstrating behaviors consistent with a Rancho Level VII and requires Min A-Supervision verbal cues for functional problem solving, working memory with utilization of compensatory strategies, anticipatory awareness, safety awareness, and alternating attention. Pt is also consuming regular textures with thin liquids without overt s/s of aspiration and is overall Mod I for utilization of swallowing  compensatory strategies.  Pt/family education complete and pt will discharge home with 24 hour supervision. Pt would benefit from f/u outpatient SLP services to maximize cognitive function and overall functional independence.    are Partner:  Caregiver Able to Provide Assistance: Yes  Type of Caregiver Assistance: Physical;Cognitive  Recommendation:  Outpatient SLP;24 hour supervision/assistance  Rationale for SLP Follow Up: Reduce caregiver burden;Maximize cognitive function and independence   Equipment: N/A  Reasons for discharge: Discharged from hospital;Treatment goals met   Patient/Family Agrees with Progress Made and Goals Achieved: Yes   See FIM for current functional status  [name] 09/04/2012, 11:46 AM

## 2012-09-04 NOTE — Progress Notes (Signed)
Patient ID: Theresa Lynch, female   DOB: 08-28-1929, 79 y.o.   MRN: [id number] Subjective/Complaints: Had another good day yesterday. Pleased with progress. Self-conscious about bruising on face A 12 point review of systems has been performed and if not noted above is otherwise negative.   Objective: Vital Signs: Blood pressure 142/82, pulse 67, temperature 98.2 F (36.8 C), temperature source Oral, resp. rate 20, height 5\' 6"  (1.676 m), weight 66.3 kg (146 lb 2.6 oz), SpO2 97.00%. No results found. No results found for this basename: WBC, HGB, HCT, PLT,  in the last 72 hours No results found for this basename: NA, K, CL, CO, GLUCOSE, BUN, CREATININE, CALCIUM,  in the last 72 hours CBG (last 3)  No results found for this basename: GLUCAP,  in the last 72 hours  Wt Readings from Last 3 Encounters:  08/28/12 66.3 kg (146 lb 2.6 oz)  08/20/12 67.5 kg (148 lb 13 oz)  08/14/12 64.9 kg (143 lb 1.3 oz)     CBG (last 3)  No results found for this basename: GLUCAP,  in the last 72 hours  Wt Readings from Last 3 Encounters:  08/28/12 66.3 kg (146 lb 2.6 oz)  08/20/12 67.5 kg (148 lb 13 oz)  08/14/12 64.9 kg (143 lb 1.3 oz)    Physical Exam:  Constitutional: She appears well-developed and well-nourished.  Right facial ecchymoses, wounds all showing improvement with minmal to no swelling at this point HENT: THRUSH on tongue resolved Head: Normocephalic.  Eyes: Pupils are equal, round, and reactive to light. Right eye exhibits discharge.  Neck: Normal range of motion. Neck supple.  Cardiovascular: Normal rate.  Pulmonary/Chest: Effort normal and breath sounds normal.  Abdominal: Soft. Bowel sounds are normal. She exhibits no distension. There is no tenderness.  Neurological: She is alert.  Very alert. Oriented to person and place, time Speech clear but slow with delayed processing persistent--a little better.. Mild expressive deficits as well.   Has receptive language and processing  deficits as well.. Delayed and often Unable to follow 2 step commands. perseveration with motor (especially with LUE) and verbal apraxia noted. BLE with ataxic movements.  Skin: Skin is warm and dry. Large bruise right lateral breast area   Assessment/Plan: 1. 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 Physiatrist is providing close team supervision and 24 hour management of active medical problems listed below. Physiatrist and rehab team continue to assess barriers to discharge/monitor patient progress toward functional and medical goals.  Finalizing dc planning for tomorrow  FIM: FIM - Bathing Bathing Steps Patient Completed: Chest;Right Arm;Left Arm;Buttocks;Right upper leg;Left upper leg;Abdomen;Right lower leg (including foot);Left lower leg (including foot);Front perineal area Bathing: 5: Supervision: Safety issues/verbal cues  FIM - Upper Body Dressing/Undressing Upper body dressing/undressing steps patient completed: Thread/unthread right sleeve of pullover shirt/dresss;Thread/unthread left sleeve of pullover shirt/dress;Put head through opening of pull over shirt/dress;Pull shirt over trunk Upper body dressing/undressing: 5: Supervision: Safety issues/verbal cues FIM - Lower Body Dressing/Undressing Lower body dressing/undressing steps patient completed: Thread/unthread right underwear leg;Thread/unthread right pants leg;Thread/unthread left underwear leg;Thread/unthread left pants leg;Pull underwear up/down;Pull pants up/down;Fasten/unfasten pants;Don/Doff right sock;Don/Doff left sock;Don/Doff right shoe (elected not to wear shoes) Lower body dressing/undressing: 5: Supervision: Safety issues/verbal cues  FIM - Toileting Toileting steps completed by patient: Adjust clothing prior to toileting;Performs perineal hygiene;Adjust clothing  after toileting Toileting Assistive Devices: Grab bar or rail for  support Toileting: 4: Steadying assist  FIM - Diplomatic Services operational officer Devices: Grab bars Toilet Transfers: 5-To toilet/BSC: Supervision (verbal cues/safety issues);5-From toilet/BSC: Supervision (verbal cues/safety issues)  FIM - Banker Devices: Bed rails Bed/Chair Transfer: 5: Supine > Sit: Supervision (verbal cues/safety issues);5: Sit > Supine: Supervision (verbal cues/safety issues);4: Bed > Chair or W/C: Min A (steadying Pt. > 75%);4: Chair or W/C > Bed: Min A (steadying Pt. > 75%)  FIM - Locomotion: Wheelchair Distance: 100', 50' Locomotion: Wheelchair: 0: Activity did not occur FIM - Locomotion: Ambulation Locomotion: Ambulation Assistive Devices: Other (comment) (none) Ambulation/Gait Assistance: 4: Min guard Locomotion: Ambulation: 5: Travels 150 ft or more with supervision/safety issues  Comprehension Comprehension Mode: Auditory Comprehension: 5-Follows basic conversation/direction: With no assist  Expression Expression Mode: Verbal Expression: 5-Expresses basic needs/ideas: With extra time/assistive device  Social Interaction Social Interaction: 4-Interacts appropriately 75 - 89% of the time - Needs redirection for appropriate language or to initiate interaction.  Problem Solving Problem Solving: 5-Solves basic 90% of the time/requires cueing < 10% of the time  Memory Memory: 5-Recognizes or recalls 90% of the time/requires cueing < 10% of the time  Medical Problem List and Plan:  1. DVT Prophylaxis/Anticoagulation: Mechanical: Sequential compression devices, below knee Bilateral lower extremities  2. Pain Management: tylenol prn for headaches.  3. H/o depression/anxiety/claustophobia: -appreciate Neuropsych recs  -increased paxil and add low dose prn xanax for anxiety, seems to be helping 4. Neuropsych: This patient is not capable of  making decisions on her own behalf.  5. Leucocytosis: Sinusitis noted on Xrays.afebrile no URI symptoms Urine cx neg  -cath tip culture neg 6. ABLA: hgb 10. Reactive increase in platetets 7. Dizziness  : likely vestibular component. Therapy attempting to work with and through 7. HTN: will monitor with bid checks. Continue HCTZ 9. Hypokalemia/FEN- continue current rx 10. Insomnia: scheduled trazodone qhs helpful   LOS (Days) 12 A FACE TO FACE EVALUATION WAS PERFORMED  Tailor Westfall T 09/04/2012 9:28 AM

## 2012-09-05 MED ORDER — MECLIZINE HCL 25 MG PO TABS: 25.0000 mg | ORAL_TABLET | Freq: Every day | ORAL | Status: DC

## 2012-09-05 MED ORDER — POTASSIUM CHLORIDE ER 10 MEQ PO TBCR: 10.0000 meq | EXTENDED_RELEASE_TABLET | Freq: Every day | ORAL | Status: DC

## 2012-09-05 MED ORDER — METOPROLOL TARTRATE 25 MG PO TABS: 25.0000 mg | ORAL_TABLET | Freq: Two times a day (BID) | ORAL | Status: DC

## 2012-09-05 MED ORDER — ASPIRIN 81 MG PO TBEC: 81.0000 mg | DELAYED_RELEASE_TABLET | Freq: Every day | ORAL | Status: DC

## 2012-09-05 MED ORDER — PAROXETINE HCL 20 MG PO TABS: 60.0000 mg | ORAL_TABLET | Freq: Every day | ORAL | Status: DC

## 2012-09-05 MED ORDER — ACETAMINOPHEN 325 MG PO TABS: 325.0000 mg | ORAL_TABLET | ORAL | Status: DC | PRN

## 2012-09-05 NOTE — Progress Notes (Signed)
Social Work  Discharge Note  The overall goal for the admission was met for:   Discharge location: Yes - home with family to provide 24/7 supervision  Length of Stay: Yes - 13 days  Discharge activity level: Yes - supervision  Home/community participation: Yes  Services provided included: MD, RD, PT, OT, SLP, RN, TR, Pharmacy, Neuropsych and SW  Financial Services: Medicare  Follow-up services arranged: Outpatient: PT, OT, ST via [facility], DME: tub seat via [facility] and Patient/Family has no preference for HH/DME agencies  Comments (or additional information):  Patient/Family verbalized understanding of follow-up arrangements: Yes  Individual responsible for coordination of the follow-up plan: patient  Confirmed correct DME delivered: Cordelle Dahmen 09/05/2012    Gaytha Raybourn

## 2012-09-05 NOTE — Progress Notes (Signed)
Patient ID: Theresa Lynch, female   DOB: 11-Aug-1929, 79 y.o.   MRN: [id number] Subjective/Complaints: Excited to go home. In good spirits A 12 point review of systems has been performed and if not noted above is otherwise negative.   Objective: Vital Signs: Blood pressure 154/84, pulse 64, temperature 97.4 F (36.3 C), temperature source Oral, resp. rate 19, height 5\' 6"  (1.676 m), weight 64.8 kg (142 lb 13.7 oz), SpO2 95.00%. No results found. No results found for this basename: WBC, HGB, HCT, PLT,  in the last 72 hours No results found for this basename: NA, K, CL, CO, GLUCOSE, BUN, CREATININE, CALCIUM,  in the last 72 hours CBG (last 3)   Recent Labs  09/04/12 2114  GLUCAP 117*    Wt Readings from Last 3 Encounters:  09/04/12 64.8 kg (142 lb 13.7 oz)  08/20/12 67.5 kg (148 lb 13 oz)  08/14/12 64.9 kg (143 lb 1.3 oz)     CBG (last 3)   Recent Labs  09/04/12 2114  GLUCAP 117*    Wt Readings from Last 3 Encounters:  09/04/12 64.8 kg (142 lb 13.7 oz)  08/20/12 67.5 kg (148 lb 13 oz)  08/14/12 64.9 kg (143 lb 1.3 oz)    Physical Exam:  Constitutional: She appears well-developed and well-nourished.  Right facial ecchymoses, wounds all showing improvement with minmal to no swelling at this point HENT: THRUSH on tongue resolved Head: Normocephalic.  Eyes: Pupils are equal, round, and reactive to light. Right eye exhibits discharge.  Neck: Normal range of motion. Neck supple.  Cardiovascular: Normal rate.  Pulmonary/Chest: Effort normal and breath sounds normal.  Abdominal: Soft. Bowel sounds are normal. She exhibits no distension. There is no tenderness.  Neurological: She is alert.  Very alert. Oriented to person and place, time Speech clear but slow with delayed processing persistent--a little better.. Mild expressive deficits as well.   Has receptive language and processing deficits as well.. Delayed and often Unable to follow 2 step commands. perseveration with motor  (especially with LUE) and verbal apraxia noted. BLE with ataxic movements.  Skin: Skin is warm and dry. Large bruise right lateral breast area   Assessment/Plan: 1. 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 Physiatrist is providing close team supervision and 24 hour management of active medical problems listed below. Physiatrist and rehab team continue to assess barriers to discharge/monitor patient progress toward functional and medical goals.  Dc to home today. Education complete. Follow up with me in one month.  FIM: FIM - Bathing Bathing Steps Patient Completed: Chest;Right Arm;Left Arm;Abdomen;Front perineal area;Buttocks;Right upper leg;Left upper leg;Right lower leg (including foot);Left lower leg (including foot) Bathing: 5: Set-up assist to: Adjust water temp  FIM - Upper Body Dressing/Undressing Upper body dressing/undressing steps patient completed: Thread/unthread right bra strap;Thread/unthread left bra strap;Hook/unhook bra;Thread/unthread right sleeve of pullover shirt/dresss;Thread/unthread left sleeve of pullover shirt/dress;Pull shirt over trunk;Put head through opening of pull over shirt/dress Upper body dressing/undressing: 5: Supervision: Safety issues/verbal cues FIM - Lower Body Dressing/Undressing Lower body dressing/undressing steps patient completed: Thread/unthread right underwear leg;Thread/unthread left underwear leg;Pull underwear up/down;Thread/unthread right pants leg;Thread/unthread left pants leg;Pull pants up/down;Don/Doff right shoe;Don/Doff left shoe;Fasten/unfasten right shoe;Fasten/unfasten left shoe Lower body dressing/undressing: 5: Supervision: Safety issues/verbal cues  FIM - Toileting Toileting steps completed by patient: Performs perineal hygiene;Adjust clothing prior to toileting;Adjust clothing after  toileting Toileting Assistive Devices: Grab bar  or rail for support Toileting: 5: Supervision: Safety issues/verbal cues  FIM - Diplomatic Services operational officer Devices: Grab bars Toilet Transfers: 5-To toilet/BSC: Supervision (verbal cues/safety issues);5-From toilet/BSC: Supervision (verbal cues/safety issues)  FIM - Banker Devices: Bed rails Bed/Chair Transfer: 7: Independent: No helper;5: Bed > Chair or W/C: Supervision (verbal cues/safety issues);5: Chair or W/C > Bed: Supervision (verbal cues/safety issues) ((I) bed mob)  FIM - Locomotion: Wheelchair Distance: 100', 50' Locomotion: Wheelchair: 0: Activity did not occur FIM - Locomotion: Ambulation Locomotion: Ambulation Assistive Devices: Other (comment) (none) Ambulation/Gait Assistance: 4: Min assist Locomotion: Ambulation: 4: Travels 150 ft or more with minimal assistance (Pt.>75%)  Comprehension Comprehension Mode: Auditory Comprehension: 5-Follows basic conversation/direction: With no assist  Expression Expression Mode: Verbal Expression: 5-Expresses basic needs/ideas: With extra time/assistive device  Social Interaction Social Interaction: 5-Interacts appropriately 90% of the time - Needs monitoring or encouragement for participation or interaction.  Problem Solving Problem Solving: 4-Solves basic 75 - 89% of the time/requires cueing 10 - 24% of the time  Memory Memory: 4-Recognizes or recalls 75 - 89% of the time/requires cueing 10 - 24% of the time  Medical Problem List and Plan:  1. DVT Prophylaxis/Anticoagulation: Mechanical: Sequential compression devices, below knee Bilateral lower extremities  2. Pain Management: tylenol prn for headaches.  3. H/o depression/anxiety/claustophobia: -appreciate Neuropsych recs  -increased paxil and add low dose prn xanax for anxiety, seems to be helping 4. Neuropsych: This patient is not capable of making decisions on her  own behalf.  5. Leucocytosis: Sinusitis noted on Xrays.afebrile no URI symptoms Urine cx neg  -cath tip culture neg 6. ABLA: hgb 10. Reactive increase in platetets 7. Dizziness  : likely vestibular component. Therapy attempting to work with and through 7. HTN: will monitor with bid checks. Continue HCTZ 9. Hypokalemia/FEN- continue current rx 10. Insomnia: scheduled trazodone qhs helpful   LOS (Days) 13 A FACE TO FACE EVALUATION WAS PERFORMED  Umar Patmon T 09/05/2012 8:42 AM

## 2012-09-05 NOTE — Progress Notes (Signed)
Patient discharge to home with husband and daughter at 35. Cipriano Mile, PA reviewed discharge instructions with patient and family . Patient and family verbalized understanding of discharge instructions. Continue with plan of care .  Cleotilde Neer

## 2012-09-05 NOTE — Patient Care Conference (Signed)
Inpatient RehabilitationTeam Conference and Plan of Care Update Date: 09/03/2012   Time: 2:45 PM    Patient Name: Theresa Lynch      Medical Record Number: [id number]  Date of Birth: Jan 12, 1930 Sex: Female         Room/Bed: 4W22C/4W22C-01 Payor Info: Payor: Advertising copywriter MEDICARE / Plan: AARP MEDICARE COMPLETE / Product Type: *No Product type* /    Admitting Diagnosis: TBI  Admit Date/Time:  08/23/2012  4:19 PM Admission Comments: No comment available   Primary Diagnosis:  TBI (traumatic brain injury) Principal Problem: TBI (traumatic brain injury)  Patient Active Problem List   Diagnosis Date Noted  . Leucocytosis 08/23/2012  . TBI (traumatic brain injury) 08/21/2012  . Hypokalemia 08/21/2012  . Hyponatremia 08/21/2012  . Syncope 08/21/2012  . Chest pain 08/21/2012  . Fall 08/18/2012  . Acute respiratory failure 08/18/2012  . Acute blood loss anemia 08/18/2012  . Intracerebral hemorrhage 08/15/2012  . Subarachnoid hemorrhage 08/15/2012  . Closed basilar skull fracture with subarachnoid hemorrhage 08/15/2012  . Closed fracture of facial bones 08/15/2012    Expected Discharge Date: Expected Discharge Date: 09/05/12  Team Members Present: Physician leading conference: Dr. Faith Rogue Social Worker Present: Amada Jupiter, LCSW Nurse Present: Carmie End, RN PT Present: Zerita Boers, PT OT Present: Bretta Bang, OT;Jennifer Ivor Messier, OT;Ardis Rowan, COTA SLP Present: Feliberto Gottron, SLP     Current Status/Progress Goal Weekly Team Focus  Medical   making cognitive progress, anxiety is decreased  see prior  safety awareness, finalizing dc planning   Bowel/Bladder   cont of bowel and bladder  will remain continent of B/B.  continent of bowel and bladder   Swallow/Nutrition/ Hydration   Regular textures with thin liquids, intermittent supervision  supervision with least restrictive diet  Family education    ADL's   supervision  supervision overall  d/c  planning, fam education, anticipatory awareness   Mobility   (S) to min A overall  (I) bed mob; (S) for dynamic sit/stand balance, bed<>chair and car t/fs, amb x150' in home and controlled environments; Min A for amb x150' in community environment and stair negotiation.   Dynamic balance, safety, amb endurance w/out use of AD in busy environments   Communication             Safety/Cognition/ Behavioral Observations  Supervision-Min A with basic and familiar tasks  min A  Family education    Pain   Vicodin 5/325 mg prn q4h/ denied pain at this time  less than or equal to 3.  continue to monitor/ assess for pain qshift   Skin   bruising to face and arms  remain free of skin breakdown/infection.   will continue to monitor q shift      *See Care Plan and progress notes for long and short-term goals.  Barriers to Discharge: see prior    Possible Resolutions to Barriers:  family ed    Discharge Planning/Teaching Needs:  Home with spouse and daughter to provide 24/7 supervision      Team Discussion:  Excellent gains this week and decrease in overall anxiety.  Improved hand/ eye coordination.  Family ed completing today .  Revisions to Treatment Plan:  None   Continued Need for Acute Rehabilitation Level of Care: The patient requires daily medical management by a physician with specialized training in physical medicine and rehabilitation for the following conditions: Daily direction of a multidisciplinary physical rehabilitation program to ensure safe treatment while eliciting the highest outcome  that is of practical value to the patient.: Yes Daily medical management of patient stability for increased activity during participation in an intensive rehabilitation regime.: Yes Daily analysis of laboratory values and/or radiology reports with any subsequent need for medication adjustment of medical intervention for : Neurological problems;Other  Shaddai Shapley 09/05/2012, 1:16 PM

## 2012-09-05 NOTE — Progress Notes (Signed)
Recreational Therapy Discharge Summary Patient Details  Name: Theresa Lynch MRN: [id number] Date of Birth: May 01, 1929 Today's Date: 09/05/2012  Long term goals set: 1  Long term goals met: 1  Comments on progress toward goals: Pt has made great progress toward goal and is ready for discharge home today with family to provide 24 hours supervision.  Pt continues to be limited by decreased cognition, requiring verbal cuing for safety. Reasons for discharge: discharge from hospital  Patient/family agrees with progress made and goals achieved: Yes  Etienne Millward 09/05/2012, 8:04 AM

## 2012-09-16 ENCOUNTER — Encounter: Payer: Self-pay | Admitting: Physical Medicine & Rehabilitation

## 2012-09-23 ENCOUNTER — Encounter: Payer: Self-pay | Admitting: Physical Medicine & Rehabilitation

## 2012-09-27 ENCOUNTER — Encounter: Payer: Self-pay | Admitting: Physical Medicine & Rehabilitation

## 2012-09-27 ENCOUNTER — Encounter: Payer: Medicare Other | Attending: Physical Medicine & Rehabilitation | Admitting: Physical Medicine & Rehabilitation

## 2012-09-27 VITALS — BP 119/52 | HR 61 | Resp 18 | Ht 65.0 in | Wt 140.0 lb

## 2012-09-27 DIAGNOSIS — X58XXXS Exposure to other specified factors, sequela: Secondary | ICD-10-CM | POA: Insufficient documentation

## 2012-09-27 DIAGNOSIS — I609 Nontraumatic subarachnoid hemorrhage, unspecified: Secondary | ICD-10-CM

## 2012-09-27 DIAGNOSIS — S069X0S Unspecified intracranial injury without loss of consciousness, sequela: Secondary | ICD-10-CM

## 2012-09-27 DIAGNOSIS — H8110 Benign paroxysmal vertigo, unspecified ear: Secondary | ICD-10-CM

## 2012-09-27 DIAGNOSIS — W19XXXA Unspecified fall, initial encounter: Secondary | ICD-10-CM

## 2012-09-27 DIAGNOSIS — S069X9S Unspecified intracranial injury with loss of consciousness of unspecified duration, sequela: Secondary | ICD-10-CM

## 2012-09-27 DIAGNOSIS — S069XAS Unspecified intracranial injury with loss of consciousness status unknown, sequela: Secondary | ICD-10-CM | POA: Insufficient documentation

## 2012-09-27 DIAGNOSIS — E785 Hyperlipidemia, unspecified: Secondary | ICD-10-CM | POA: Insufficient documentation

## 2012-09-27 DIAGNOSIS — W19XXXD Unspecified fall, subsequent encounter: Secondary | ICD-10-CM

## 2012-09-27 DIAGNOSIS — G4700 Insomnia, unspecified: Secondary | ICD-10-CM | POA: Insufficient documentation

## 2012-09-27 NOTE — Progress Notes (Signed)
Subjective:    Patient ID: Theresa Lynch, female    DOB: Jul 14, 1929, 79 y.o.   MRN: [id number]  HPI  Theresa Lynch is back in follow up of her TBI. She has been seen by PT, OT, and SLP at [facility]. She was evaled by OT and SLP and no further therapy was recommended. PT is still in process.   She is improving with her reading and comprehension but not back to baseline.   Her balance is still listing to the left, but it has improved.   She denies pain. Her sleep has been good. She continues on the trazodone at night.   Theresa Lynch would like to return to driving.   Pain Inventory Average Pain n/a Pain Right Now n/a My pain is n/a  In the last 24 hours, has pain interfered with the following? General activity n/a Relation with others n/a Enjoyment of life n/a What TIME of day is your pain at its worst? n/a Sleep (in general) Fair  Pain is worse with: n/a Pain improves with: n/a Relief from Meds: n/a  Mobility walk without assistance ability to climb steps?  yes do you drive?  no transfers alone Do you have any goals in this area?  yes  Function retired Do you have any goals in this area?  no  Neuro/Psych No problems in this area  Prior Studies Any changes since last visit?  no  Physicians involved in your care Any changes since last visit?  no   History reviewed. No pertinent family history. History   Social History  . Marital Status: Unknown    Spouse Name: N/A    Number of Children: N/A  . Years of Education: N/A   Social History Main Topics  . Smoking status: Never Smoker   . Smokeless tobacco: None  . Alcohol Use: 4.2 oz/week    7 Glasses of wine per week  . Drug Use: No  . Sexual Activity: None   Other Topics Concern  . None   Social History Narrative  . None   Past Surgical History  Procedure Laterality Date  . Rotator cuff repair  1999    bilateral   Past Medical History  Diagnosis Date  . Glaucoma   . Depression     with anxiety/h/o  agoraphobia  . Hyperlipidemia   . Arthritis   . PONV (postoperative nausea and vomiting)   . Claustrophobia   . HOH (hard of hearing)    BP 119/52  Pulse 61  Resp 18  Ht 5\' 5"  (1.651 m)  Wt 140 lb (63.504 kg)  BMI 23.3 kg/m2  SpO2 99%     Review of Systems  Constitutional: Positive for unexpected weight change.  All other systems reviewed and are negative.       Objective:   Physical Exam  General: Alert and oriented x 3, No apparent distress HEENT: Head is normocephalic, atraumatic, PERRLA, EOMI, sclera anicteric, oral mucosa pink and moist, dentition intact, ext ear canals clear,  Neck: Supple without JVD or lymphadenopathy Heart: Reg rate and rhythm. No murmurs rubs or gallops Chest: CTA bilaterally without wheezes, rales, or rhonchi; no distress Abdomen: Soft, non-tender, non-distended, bowel sounds positive. Extremities: No clubbing, cyanosis, or edema. Pulses are 2+ Skin: Clean and intact without signs of breakdown. Bruising and scarring on the face healing nicely. Neuro: Pt is oriented x3. She has processing and organizational delays. She struggle more with numbers than letters it seems. She was able to sequence the  word "world" forward and backward but could not sequence or organize simple numbers despite multiple attempts. Functionally, she could follow basic direction without substantial difficulty. Still seems to have word finding deficits on occasion. Cranial nerves 2-12 are intact. Sensory exam is normal. Reflexes are 2+ in all 4's. Fine motor coordination is intact. No tremors. Motor function is grossly 5/5. Appropriate stride length and width. No loss of balance, posture is good.  Musculoskeletal: Full ROM, No pain with AROM or PROM in the neck, trunk, or extremities. Posture appropriate.  Psych: Pt's affect is appropriate. Pt is cooperative          Assessment & Plan:  1. Functional deficits secondary to TBI including IPH in the left temporo-parietal  region  2. Hx of BPPV 3. Insomnia   Plan: 1. She really is doing quite well. I asked that PT do a vestibular evaluation to rule out the vertigo as a source of the "listing" or persistent balance issues. I suspect, given her history, that some of the dysequilibrium she's having may be mild orthostasis. Recommend taking time, letting the feet dangle before she gets up. I advised her to hold the meclizine to see if she noticed any change in her symptoms as well. 2. Pt continues to have ongoing processing delays, organizational deficits, and even word-finding problems. I find it hard to believe that SLP has signed off on her. Yes, she can work on these issues at home on her own, but I think she would benefit from a formal intervention and treatment plan still.  3. Follow up with me in about 6 weeks. 30 minutes of face to face patient care time were spent during this visit. All questions were encouraged and answered.

## 2012-09-27 NOTE — Patient Instructions (Signed)
1. STOP MECLIZINE AND OBSERVE FOR ANY DIZZINESS. I THINK YOU CAN PROBABLY STOP THIS   2. ASK PT TO PERFORM A VESTIBULAR ASSESSMENT   3. DISCUSS WITH SPEECH THERAPY REGARDING CONTINUE TREATMENTS FOR PROCESSING/ORGANIZATION/COGNITION

## 2012-10-23 ENCOUNTER — Encounter: Payer: Self-pay | Admitting: Physical Medicine & Rehabilitation

## 2012-11-06 ENCOUNTER — Encounter: Payer: Medicare Other | Attending: Physical Medicine & Rehabilitation | Admitting: Physical Medicine & Rehabilitation

## 2012-11-06 ENCOUNTER — Encounter (INDEPENDENT_AMBULATORY_CARE_PROVIDER_SITE_OTHER): Payer: Self-pay

## 2012-11-06 ENCOUNTER — Encounter: Payer: Self-pay | Admitting: Physical Medicine & Rehabilitation

## 2012-11-06 VITALS — BP 154/111 | HR 56 | Resp 14 | Ht 65.5 in | Wt 142.8 lb

## 2012-11-06 DIAGNOSIS — E785 Hyperlipidemia, unspecified: Secondary | ICD-10-CM | POA: Insufficient documentation

## 2012-11-06 DIAGNOSIS — G4700 Insomnia, unspecified: Secondary | ICD-10-CM | POA: Insufficient documentation

## 2012-11-06 DIAGNOSIS — S069X9S Unspecified intracranial injury with loss of consciousness of unspecified duration, sequela: Secondary | ICD-10-CM | POA: Insufficient documentation

## 2012-11-06 DIAGNOSIS — S069X0S Unspecified intracranial injury without loss of consciousness, sequela: Secondary | ICD-10-CM

## 2012-11-06 DIAGNOSIS — H8110 Benign paroxysmal vertigo, unspecified ear: Secondary | ICD-10-CM | POA: Insufficient documentation

## 2012-11-06 DIAGNOSIS — X58XXXS Exposure to other specified factors, sequela: Secondary | ICD-10-CM | POA: Insufficient documentation

## 2012-11-06 DIAGNOSIS — S069XAS Unspecified intracranial injury with loss of consciousness status unknown, sequela: Secondary | ICD-10-CM | POA: Insufficient documentation

## 2012-11-06 NOTE — Patient Instructions (Signed)
YOU MAY DRIVE SHORT DISTANCES (NO LONGER THAN 15 MINUTES IN ONE DIRECTION) LOCALLY DURING THE DAY.    KEEP UP WITH YOUR VERTIGO EXERCISES AT LEAST ON A WEEKLY BASIS.    CALL ME WITH ANY PROBLEMS OR QUESTIONS (#[phone]).  HAVE A GOOD DAY

## 2012-11-06 NOTE — Progress Notes (Signed)
Subjective:    Patient ID: Theresa Lynch, female    DOB: Dec 07, 1929, 79 y.o.   MRN: [id number]  HPI  Mrs. Kayes is back regarding her TBI. She just wrapped up speech therapy last week. She is back to cooking and cleaning. Her balance is much improved.   She has practiced driving and begun driving locally in her community during the day with the assistance of family. No problems were reported.    Pain Inventory Average Pain 0 Pain Right Now 0 My pain is no pain  In the last 24 hours, has pain interfered with the following? General activity 0 Relation with others 0 Enjoyment of life 0 What TIME of day is your pain at its worst? no pain Sleep (in general) Fair  Pain is worse with: no pain Pain improves with: no pain Relief from Meds: no pain meds  Mobility walk with assistance ability to climb steps?  yes do you drive?  no transfers alone  Function retired  Neuro/Psych No problems in this area  Prior Studies Any changes since last visit?  no  Physicians involved in your care Any changes since last visit?  no   History reviewed. No pertinent family history. History   Social History  . Marital Status: Unknown    Spouse Name: N/A    Number of Children: N/A  . Years of Education: N/A   Social History Main Topics  . Smoking status: Never Smoker   . Smokeless tobacco: None  . Alcohol Use: 4.2 oz/week    7 Glasses of wine per week  . Drug Use: No  . Sexual Activity: None   Other Topics Concern  . None   Social History Narrative  . None   Past Surgical History  Procedure Laterality Date  . Rotator cuff repair  1999    bilateral   Past Medical History  Diagnosis Date  . Glaucoma   . Depression     with anxiety/h/o agoraphobia  . Hyperlipidemia   . Arthritis   . PONV (postoperative nausea and vomiting)   . Claustrophobia   . HOH (hard of hearing)    BP 154/111  Pulse 56  Resp 14  Ht 5' 5.5" (1.664 m)  Wt 142 lb 12.8 oz (64.774 kg)  BMI  23.39 kg/m2  SpO2 95%      Review of Systems     Objective:   Physical Exam  General: Alert and oriented x 3, No apparent distress  HEENT: Head is normocephalic, atraumatic, PERRLA, EOMI, sclera anicteric, oral mucosa pink and moist, dentition intact, ext ear canals clear,  Neck: Supple without JVD or lymphadenopathy  Heart: Reg rate and rhythm. No murmurs rubs or gallops  Chest: CTA bilaterally without wheezes, rales, or rhonchi; no distress  Abdomen: Soft, non-tender, non-distended, bowel sounds positive.  Extremities: No clubbing, cyanosis, or edema. Pulses are 2+  Skin: Clean and intact without signs of breakdown. Bruising and scarring on the face healing nicely.  Neuro: Pt is oriented x3.  Speech is a little fragmented but content is good. She has some difficulties organizing numerical sequences. Functional processing is fair to good.  Cranial nerves 2-12 are intact. Sensory exam is normal. Reflexes are 2+ in all 4's. Fine motor coordination is intact. No tremors. Motor function is grossly 5/5. Appropriate stride length and width. No loss of balance, posture is good.  Musculoskeletal: Full ROM, No pain with AROM or PROM in the neck, trunk, or extremities. Posture appropriate.  Psych: Pt's  affect is appropriate. Pt is cooperative   Assessment & Plan:   1. Functional deficits secondary to TBI including IPH in the left temporo-parietal region  2. Hx of BPPV  3. Insomnia    Plan:  1. Gave her permission to drive local distances during the day. Specific recs were given.  2. Continue with maintenance vestibular exercises.  3. Follow up with me PRN. 15 minutes of face to face patient care time were spent during this visit. All questions were encouraged and answered. I spoke with her daughter as well and reviewed the recs.

## 2013-01-02 ENCOUNTER — Other Ambulatory Visit: Payer: Self-pay

## 2014-02-09 ENCOUNTER — Ambulatory Visit: Payer: Self-pay | Admitting: Ophthalmology

## 2014-05-24 NOTE — Op Note (Signed)
PATIENT NAME:  Theresa Lynch, Theresa Lynch MR#:  [id number] DATE OF BIRTH:  Apr 13, 1929  DATE OF PROCEDURE:  02/09/2014  PREOPERATIVE DIAGNOSES:  Visually significant nuclear sclerotic cataract, left eye.  POSTOPERATIVE DIAGNOSES:  Visually significant nuclear sclerotic cataract, left eye.  PROCEDURES PERFORMED:  Cataract extraction with intraocular lens implant, left eye.   SURGEON:  Almon Hercules, MD  ANESTHESIA:  Monitored anesthesia care and topical.  IMPLANTS:  [id number], +20 diopter, serial number is [id number] with an expiration date of 09/23/2018.  COMPLICATIONS:  None.  DESCRIPTION OF PROCEDURE:  Therapeutic options were discussed with the patient preoperatively, including a discussion of risks and benefits of surgery.  Informed consent was obtained.  An IOL-Master and immersion biometry were used to take the lens measurements, and a dilated fundus exam was performed within 6 months of the surgical date. The patient was premedicated and brought to the operating room and placed on the operating table in the supine position.  Topical tetracaine was instilled to the operative eye.   After adequate anesthesia, the patient was prepped and draped in the usual sterile ophthalmic fashion.  A wire lid speculum was inserted and the microscope was positioned.  A paracentesis was created at the limbus and approximately 2 ml of dilute preservative free lidocaine was instilled into the anterior chamber, followed by dispersive viscoelastic.  A clear corneal incision was created temporally using a keratome blade.  Capsulorrhexis was then performed.  In situ phacoemulsification was performed.  Cortical material was removed with the irrigation-aspiration unit.  Cohesive viscoelastic was instilled to open the capsular bag.  A posterior chamber intraocular lens with the specifications above was inserted and positioned.  Irrigation-aspiration was used to remove all viscoelastic.  Cefuroxime 1cc was instilled into the anterior  chamber, and the corneal incisions were checked and found to be water tight.  The eyelid speculum was removed.   The operative eye was covered with protective goggles after instilling 1 drop of timolol and brimonidine.  The patient tolerated the procedure well.  There were no complications.   ____________________________ Grayland Ormond, MD apv:TT D: 02/09/2014 19:06:12 ET T: 02/09/2014 21:26:07 ET JOB#: [id number]  cc: Grayland Ormond, MD, <Dictator>

## 2015-02-10 DIAGNOSIS — H401491 Capsular glaucoma with pseudoexfoliation of lens, unspecified eye, mild stage: Secondary | ICD-10-CM | POA: Diagnosis not present

## 2015-02-10 DIAGNOSIS — H2511 Age-related nuclear cataract, right eye: Secondary | ICD-10-CM | POA: Diagnosis not present

## 2015-02-26 DIAGNOSIS — H2511 Age-related nuclear cataract, right eye: Secondary | ICD-10-CM | POA: Diagnosis not present

## 2015-03-04 ENCOUNTER — Encounter: Payer: Self-pay | Admitting: *Deleted

## 2015-03-05 NOTE — Discharge Instructions (Signed)

## 2015-03-15 ENCOUNTER — Encounter: Payer: Self-pay | Admitting: *Deleted

## 2015-03-15 ENCOUNTER — Ambulatory Visit
Admission: RE | Admit: 2015-03-15 | Discharge: 2015-03-15 | Disposition: A | Discharge status: Home / Self Care | Payer: PPO | Source: Ambulatory Visit | Attending: Ophthalmology | Admitting: Ophthalmology

## 2015-03-15 ENCOUNTER — Encounter
Admission: RE | Disposition: A | Discharge status: Home / Self Care | Payer: Self-pay | Source: Ambulatory Visit | Attending: Ophthalmology

## 2015-03-15 ENCOUNTER — Encounter: Payer: Self-pay | Admitting: Anesthesiology

## 2015-03-15 DIAGNOSIS — Z9842 Cataract extraction status, left eye: Secondary | ICD-10-CM | POA: Diagnosis not present

## 2015-03-15 DIAGNOSIS — F410 Panic disorder [episodic paroxysmal anxiety] without agoraphobia: Secondary | ICD-10-CM | POA: Diagnosis not present

## 2015-03-15 DIAGNOSIS — Z85828 Personal history of other malignant neoplasm of skin: Secondary | ICD-10-CM | POA: Diagnosis not present

## 2015-03-15 DIAGNOSIS — E7800 Pure hypercholesterolemia, unspecified: Secondary | ICD-10-CM | POA: Diagnosis not present

## 2015-03-15 DIAGNOSIS — Z79899 Other long term (current) drug therapy: Secondary | ICD-10-CM | POA: Diagnosis not present

## 2015-03-15 DIAGNOSIS — Z9889 Other specified postprocedural states: Secondary | ICD-10-CM | POA: Insufficient documentation

## 2015-03-15 DIAGNOSIS — Z880 Allergy status to penicillin: Secondary | ICD-10-CM | POA: Diagnosis not present

## 2015-03-15 DIAGNOSIS — Z885 Allergy status to narcotic agent status: Secondary | ICD-10-CM | POA: Diagnosis not present

## 2015-03-15 DIAGNOSIS — F329 Major depressive disorder, single episode, unspecified: Secondary | ICD-10-CM | POA: Diagnosis not present

## 2015-03-15 DIAGNOSIS — I10 Essential (primary) hypertension: Secondary | ICD-10-CM | POA: Insufficient documentation

## 2015-03-15 DIAGNOSIS — H269 Unspecified cataract: Secondary | ICD-10-CM | POA: Diagnosis not present

## 2015-03-15 DIAGNOSIS — H2511 Age-related nuclear cataract, right eye: Secondary | ICD-10-CM | POA: Insufficient documentation

## 2015-03-15 DIAGNOSIS — H9193 Unspecified hearing loss, bilateral: Secondary | ICD-10-CM | POA: Diagnosis not present

## 2015-03-15 HISTORY — PX: CATARACT EXTRACTION W/PHACO: SHX586

## 2015-03-15 HISTORY — DX: Essential (primary) hypertension: I10

## 2015-03-15 SURGERY — PHACOEMULSIFICATION, CATARACT, WITH IOL INSERTION
Anesthesia: Monitor Anesthesia Care | Site: Eye | Laterality: Right | Wound class: Clean

## 2015-03-15 MED ORDER — FENTANYL CITRATE (PF) 100 MCG/2ML IJ SOLN
INTRAMUSCULAR | Status: DC | PRN
  Administered 2015-03-15: 50 ug via INTRAVENOUS

## 2015-03-15 MED ORDER — ARMC OPHTHALMIC DILATING GEL
1.0000 "application " | OPHTHALMIC | Status: DC | PRN
  Administered 2015-03-15 (×2): 1 via OPHTHALMIC

## 2015-03-15 MED ORDER — BRIMONIDINE TARTRATE 0.2 % OP SOLN
OPHTHALMIC | Status: DC | PRN
  Administered 2015-03-15: 1 [drp]

## 2015-03-15 MED ORDER — TETRACAINE HCL 0.5 % OP SOLN
1.0000 [drp] | OPHTHALMIC | Status: DC | PRN
  Administered 2015-03-15: 1 [drp] via OPHTHALMIC

## 2015-03-15 MED ORDER — MOXIFLOXACIN HCL 0.5 % OP SOLN
OPHTHALMIC | Status: DC | PRN
  Administered 2015-03-15: .1 mL via OPHTHALMIC

## 2015-03-15 MED ORDER — TIMOLOL MALEATE 0.5 % OP SOLN
OPHTHALMIC | Status: DC | PRN
  Administered 2015-03-15: 1 [drp]

## 2015-03-15 MED ORDER — NA HYALUR & NA CHOND-NA HYALUR 0.4-0.35 ML IO KIT
PACK | INTRAOCULAR | Status: DC | PRN
  Administered 2015-03-15: 1 mL via INTRAOCULAR

## 2015-03-15 MED ORDER — EPINEPHRINE HCL 1 MG/ML IJ SOLN
INTRAMUSCULAR | Status: DC | PRN
  Administered 2015-03-15: 103 mL via OPHTHALMIC

## 2015-03-15 MED ORDER — POVIDONE-IODINE 5 % OP SOLN
1.0000 "application " | OPHTHALMIC | Status: DC | PRN
  Administered 2015-03-15: 1 via OPHTHALMIC

## 2015-03-15 MED ORDER — MIDAZOLAM HCL 2 MG/2ML IJ SOLN
INTRAMUSCULAR | Status: DC | PRN
  Administered 2015-03-15: 2 mg via INTRAVENOUS

## 2015-03-15 MED ORDER — LIDOCAINE HCL (PF) 4 % IJ SOLN
INTRAOCULAR | Status: DC | PRN
  Administered 2015-03-15: 1 mL via OPHTHALMIC

## 2015-03-15 SURGICAL SUPPLY — 29 items
APPLICATOR COTTON TIP 3IN (MISCELLANEOUS) ×3 IMPLANT
CANNULA ANT/CHMB 27GA (MISCELLANEOUS) ×3 IMPLANT
DISSECTOR HYDRO NUCLEUS 50X22 (MISCELLANEOUS) ×3 IMPLANT
GLOVE BIO SURGEON STRL SZ7 (GLOVE) ×3 IMPLANT
GLOVE SURG LX 6.5 MICRO (GLOVE) ×4
GLOVE SURG LX STRL 6.5 MICRO (GLOVE) ×2 IMPLANT
GOWN STRL REUS W/ TWL LRG LVL3 (GOWN DISPOSABLE) ×2 IMPLANT
GOWN STRL REUS W/TWL LRG LVL3 (GOWN DISPOSABLE) ×4
LENS IOL ACRSF IQ PC 20.5 (Intraocular Lens) ×1 IMPLANT
LENS IOL ACRYSOF IQ POST 20.5 (Intraocular Lens) ×3 IMPLANT
MARKER SKIN DUAL TIP RULER LAB (MISCELLANEOUS) ×3 IMPLANT
NEEDLE FILTER BLUNT 18X 1/2SAF (NEEDLE) ×4
NEEDLE FILTER BLUNT 18X1 1/2 (NEEDLE) ×2 IMPLANT
PACK CATARACT BRASINGTON (MISCELLANEOUS) ×3 IMPLANT
PACK EYE AFTER SURG (MISCELLANEOUS) ×3 IMPLANT
PACK OPTHALMIC (MISCELLANEOUS) ×3 IMPLANT
RING MALYGIN 7.0 (MISCELLANEOUS) IMPLANT
SOL BAL SALT 15ML (MISCELLANEOUS)
SOLUTION BAL SALT 15ML (MISCELLANEOUS) IMPLANT
SUT ETHILON 10-0 CS-B-6CS-B-6 (SUTURE)
SUT VICRYL  9 0 (SUTURE)
SUT VICRYL 9 0 (SUTURE) IMPLANT
SUTURE EHLN 10-0 CS-B-6CS-B-6 (SUTURE) IMPLANT
SYR 3ML LL SCALE MARK (SYRINGE) ×6 IMPLANT
SYR TB 1ML LUER SLIP (SYRINGE) ×3 IMPLANT
WATER STERILE IRR 250ML POUR (IV SOLUTION) ×3 IMPLANT
WATER STERILE IRR 500ML POUR (IV SOLUTION) IMPLANT
WICK EYE OCUCEL (MISCELLANEOUS) IMPLANT
WIPE NON LINTING 3.25X3.25 (MISCELLANEOUS) ×3 IMPLANT

## 2015-03-15 NOTE — H&P (Signed)
H+P reviewed and is up to date, please see paper chart.  

## 2015-03-15 NOTE — Anesthesia Postprocedure Evaluation (Signed)
Anesthesia Post Note  Patient: Theresa Lynch  Procedure(s) Performed: Procedure(s) (LRB): CATARACT EXTRACTION PHACO AND INTRAOCULAR LENS PLACEMENT (IOC) (Right)  Patient location during evaluation: PACU Anesthesia Type: MAC Level of consciousness: awake and alert Pain management: pain level controlled Vital Signs Assessment: post-procedure vital signs reviewed and stable Respiratory status: spontaneous breathing, nonlabored ventilation, respiratory function stable and patient connected to nasal cannula oxygen Cardiovascular status: blood pressure returned to baseline and stable Postop Assessment: no signs of nausea or vomiting Anesthetic complications: no    Alisa Graff

## 2015-03-15 NOTE — Anesthesia Preprocedure Evaluation (Signed)
Anesthesia Evaluation  Patient identified by MRN, date of birth, ID band Patient awake    Reviewed: Allergy & Precautions, H&P , NPO status , Patient's Chart, lab work & pertinent test results, reviewed documented beta blocker date and time   History of Anesthesia Complications (+) PONV and history of anesthetic complications  Airway Mallampati: II  TM Distance: >3 FB Neck ROM: full    Dental no notable dental hx.    Pulmonary neg pulmonary ROS,    Pulmonary exam normal breath sounds clear to auscultation       Cardiovascular Exercise Tolerance: Good hypertension, negative cardio ROS   Rhythm:regular Rate:Normal     Neuro/Psych PSYCHIATRIC DISORDERS (anxiety, agoraphobia, depression) negative neurological ROS  negative psych ROS   GI/Hepatic negative GI ROS, Neg liver ROS,   Endo/Other  negative endocrine ROS  Renal/GU negative Renal ROS  negative genitourinary   Musculoskeletal   Abdominal   Peds  Hematology negative hematology ROS (+)   Anesthesia Other Findings   Reproductive/Obstetrics negative OB ROS                             Anesthesia Physical Anesthesia Plan  ASA: II  Anesthesia Plan: MAC   Post-op Pain Management:    Induction:   Airway Management Planned:   Additional Equipment:   Intra-op Plan:   Post-operative Plan:   Informed Consent: I have reviewed the patients History and Physical, chart, labs and discussed the procedure including the risks, benefits and alternatives for the proposed anesthesia with the patient or authorized representative who has indicated his/her understanding and acceptance.   Dental Advisory Given  Plan Discussed with: CRNA  Anesthesia Plan Comments:         Anesthesia Quick Evaluation

## 2015-03-15 NOTE — Anesthesia Procedure Notes (Signed)
Procedure Name: MAC Performed by: Deadra Diggins Pre-anesthesia Checklist: Patient identified, Emergency Drugs available, Suction available, Timeout performed and Patient being monitored Patient Re-evaluated:Patient Re-evaluated prior to inductionOxygen Delivery Method: Nasal cannula Placement Confirmation: positive ETCO2     

## 2015-03-15 NOTE — Op Note (Signed)
Date of Surgery: 03/15/2015  PREOPERATIVE DIAGNOSES: Visually significant nuclear sclerotic cataract, right eye.  POSTOPERATIVE DIAGNOSES: Same  PROCEDURES PERFORMED: Cataract extraction with intraocular lens implant, right eye.  SURGEON: Almon Hercules, M.D.  ANESTHESIA: MAC and topical  IMPLANTS: AcrySof IQ [id number] +20.5 D   Implant Name Type Inv. Item Serial No. Manufacturer Lot No. LRB No. Used  IMPLANT LENS - [id number] Intraocular Lens IMPLANT LENS [id number] ALCON   Right 1     COMPLICATIONS: None.  DESCRIPTION OF PROCEDURE: Therapeutic options were discussed with the patient preoperatively, including a discussion of risks and benefits of surgery. Informed consent was obtained. An IOL-Master and immersion biometry were used to take the lens measurements, and a dilated fundus exam was performed within 6 months of the surgical date.  The patient was premedicated and brought to the operating room and placed on the operating table in the supine position. After adequate anesthesia, the patient was prepped and draped in the usual sterile ophthalmic fashion. A wire lid speculum was inserted and the microscope was positioned. A Superblade was used to create a paracentesis site at the limbus and a small amount of dilute preservative free lidocaine was instilled into the anterior chamber, followed by dispersive viscoelastic. A clear corneal incision was created temporally using a 2.4 mm keratome blade. Capsulorrhexis was then performed. In situ phacoemulsification was performed.  Cortical material was removed with the irrigation-aspiration unit. Dispersive viscoelastic was instilled to open the capsular bag. A posterior chamber intraocular lens with the specifications above was inserted and positioned. Irrigation-aspiration was used to remove all viscoelastic. Vigamox 1cc was instilled into the anterior chamber, and the corneal incision was checked and found to be water tight. The eyelid  speculum was removed.  The operative eye was covered with protective goggles after instilling 1 drop of timolol and brimonidine. The patient tolerated the procedure well. There were no complications.

## 2015-03-15 NOTE — Transfer of Care (Signed)
Immediate Anesthesia Transfer of Care Note  Patient: Theresa Lynch  Procedure(s) Performed: Procedure(s): CATARACT EXTRACTION PHACO AND INTRAOCULAR LENS PLACEMENT (IOC) (Right)  Patient Location: PACU  Anesthesia Type: MAC  Level of Consciousness: awake, alert  and patient cooperative  Airway and Oxygen Therapy: Patient Spontanous Breathing and Patient connected to supplemental oxygen  Post-op Assessment: Post-op Vital signs reviewed, Patient's Cardiovascular Status Stable, Respiratory Function Stable, Patent Airway and No signs of Nausea or vomiting  Post-op Vital Signs: Reviewed and stable  Complications: No apparent anesthesia complications

## 2015-03-16 ENCOUNTER — Encounter: Payer: Self-pay | Admitting: Ophthalmology

## 2015-04-12 DIAGNOSIS — Z0000 Encounter for general adult medical examination without abnormal findings: Secondary | ICD-10-CM | POA: Diagnosis not present

## 2015-04-12 DIAGNOSIS — Z7185 Encounter for immunization safety counseling: Secondary | ICD-10-CM | POA: Insufficient documentation

## 2015-04-12 DIAGNOSIS — E7800 Pure hypercholesterolemia, unspecified: Secondary | ICD-10-CM | POA: Diagnosis not present

## 2015-04-12 DIAGNOSIS — I10 Essential (primary) hypertension: Secondary | ICD-10-CM | POA: Diagnosis not present

## 2015-04-12 DIAGNOSIS — R7309 Other abnormal glucose: Secondary | ICD-10-CM | POA: Diagnosis not present

## 2015-04-12 DIAGNOSIS — Z7189 Other specified counseling: Secondary | ICD-10-CM | POA: Insufficient documentation

## 2015-04-13 DIAGNOSIS — D539 Nutritional anemia, unspecified: Secondary | ICD-10-CM | POA: Insufficient documentation

## 2015-04-13 DIAGNOSIS — R7303 Prediabetes: Secondary | ICD-10-CM | POA: Insufficient documentation

## 2015-05-10 DIAGNOSIS — I10 Essential (primary) hypertension: Secondary | ICD-10-CM | POA: Diagnosis not present

## 2015-05-10 DIAGNOSIS — I499 Cardiac arrhythmia, unspecified: Secondary | ICD-10-CM | POA: Diagnosis not present

## 2015-07-22 DIAGNOSIS — D2261 Melanocytic nevi of right upper limb, including shoulder: Secondary | ICD-10-CM | POA: Diagnosis not present

## 2015-07-22 DIAGNOSIS — L570 Actinic keratosis: Secondary | ICD-10-CM | POA: Diagnosis not present

## 2015-07-22 DIAGNOSIS — Z85828 Personal history of other malignant neoplasm of skin: Secondary | ICD-10-CM | POA: Diagnosis not present

## 2015-07-22 DIAGNOSIS — X32XXXA Exposure to sunlight, initial encounter: Secondary | ICD-10-CM | POA: Diagnosis not present

## 2015-07-22 DIAGNOSIS — D2271 Melanocytic nevi of right lower limb, including hip: Secondary | ICD-10-CM | POA: Diagnosis not present

## 2015-07-22 DIAGNOSIS — D225 Melanocytic nevi of trunk: Secondary | ICD-10-CM | POA: Diagnosis not present

## 2015-08-19 DIAGNOSIS — H401491 Capsular glaucoma with pseudoexfoliation of lens, unspecified eye, mild stage: Secondary | ICD-10-CM | POA: Diagnosis not present

## 2015-08-25 DIAGNOSIS — H401491 Capsular glaucoma with pseudoexfoliation of lens, unspecified eye, mild stage: Secondary | ICD-10-CM | POA: Diagnosis not present

## 2015-10-14 DIAGNOSIS — Z23 Encounter for immunization: Secondary | ICD-10-CM | POA: Diagnosis not present

## 2015-10-14 DIAGNOSIS — R7303 Prediabetes: Secondary | ICD-10-CM | POA: Diagnosis not present

## 2015-10-14 DIAGNOSIS — I10 Essential (primary) hypertension: Secondary | ICD-10-CM | POA: Diagnosis not present

## 2015-10-14 DIAGNOSIS — F330 Major depressive disorder, recurrent, mild: Secondary | ICD-10-CM | POA: Diagnosis not present

## 2015-10-14 DIAGNOSIS — E7800 Pure hypercholesterolemia, unspecified: Secondary | ICD-10-CM | POA: Diagnosis not present

## 2015-10-14 DIAGNOSIS — D649 Anemia, unspecified: Secondary | ICD-10-CM | POA: Diagnosis not present

## 2016-01-31 DIAGNOSIS — J069 Acute upper respiratory infection, unspecified: Secondary | ICD-10-CM | POA: Diagnosis not present

## 2016-01-31 DIAGNOSIS — R05 Cough: Secondary | ICD-10-CM | POA: Diagnosis not present

## 2016-02-23 DIAGNOSIS — H401491 Capsular glaucoma with pseudoexfoliation of lens, unspecified eye, mild stage: Secondary | ICD-10-CM | POA: Diagnosis not present

## 2016-02-29 DIAGNOSIS — R918 Other nonspecific abnormal finding of lung field: Secondary | ICD-10-CM | POA: Diagnosis not present

## 2016-02-29 DIAGNOSIS — R938 Abnormal findings on diagnostic imaging of other specified body structures: Secondary | ICD-10-CM | POA: Diagnosis not present

## 2016-07-20 DIAGNOSIS — D2272 Melanocytic nevi of left lower limb, including hip: Secondary | ICD-10-CM | POA: Diagnosis not present

## 2016-07-20 DIAGNOSIS — Z85828 Personal history of other malignant neoplasm of skin: Secondary | ICD-10-CM | POA: Diagnosis not present

## 2016-07-20 DIAGNOSIS — L821 Other seborrheic keratosis: Secondary | ICD-10-CM | POA: Diagnosis not present

## 2016-07-20 DIAGNOSIS — D225 Melanocytic nevi of trunk: Secondary | ICD-10-CM | POA: Diagnosis not present

## 2016-08-14 DIAGNOSIS — R7303 Prediabetes: Secondary | ICD-10-CM | POA: Diagnosis not present

## 2016-08-14 DIAGNOSIS — Z0000 Encounter for general adult medical examination without abnormal findings: Secondary | ICD-10-CM | POA: Diagnosis not present

## 2016-08-14 DIAGNOSIS — Z23 Encounter for immunization: Secondary | ICD-10-CM | POA: Diagnosis not present

## 2016-08-14 DIAGNOSIS — D649 Anemia, unspecified: Secondary | ICD-10-CM | POA: Diagnosis not present

## 2016-08-14 DIAGNOSIS — I10 Essential (primary) hypertension: Secondary | ICD-10-CM | POA: Diagnosis not present

## 2016-08-14 DIAGNOSIS — E7800 Pure hypercholesterolemia, unspecified: Secondary | ICD-10-CM | POA: Diagnosis not present

## 2016-08-18 DIAGNOSIS — R7303 Prediabetes: Secondary | ICD-10-CM | POA: Diagnosis not present

## 2016-08-18 DIAGNOSIS — I10 Essential (primary) hypertension: Secondary | ICD-10-CM | POA: Diagnosis not present

## 2016-08-18 DIAGNOSIS — E7800 Pure hypercholesterolemia, unspecified: Secondary | ICD-10-CM | POA: Diagnosis not present

## 2016-08-18 DIAGNOSIS — D649 Anemia, unspecified: Secondary | ICD-10-CM | POA: Diagnosis not present

## 2016-08-21 DIAGNOSIS — N183 Chronic kidney disease, stage 3 unspecified: Secondary | ICD-10-CM | POA: Insufficient documentation

## 2016-08-21 DIAGNOSIS — H401491 Capsular glaucoma with pseudoexfoliation of lens, unspecified eye, mild stage: Secondary | ICD-10-CM | POA: Diagnosis not present

## 2016-08-30 DIAGNOSIS — H401491 Capsular glaucoma with pseudoexfoliation of lens, unspecified eye, mild stage: Secondary | ICD-10-CM | POA: Diagnosis not present

## 2016-09-01 DIAGNOSIS — N289 Disorder of kidney and ureter, unspecified: Secondary | ICD-10-CM | POA: Diagnosis not present

## 2016-09-01 DIAGNOSIS — Z862 Personal history of diseases of the blood and blood-forming organs and certain disorders involving the immune mechanism: Secondary | ICD-10-CM | POA: Diagnosis not present

## 2017-02-05 DIAGNOSIS — R42 Dizziness and giddiness: Secondary | ICD-10-CM | POA: Diagnosis not present

## 2017-02-10 ENCOUNTER — Encounter: Payer: Self-pay | Admitting: Emergency Medicine

## 2017-02-10 ENCOUNTER — Emergency Department
Admission: EM | Admit: 2017-02-10 | Discharge: 2017-02-10 | Disposition: A | Discharge status: Home / Self Care | Payer: PPO | Attending: Emergency Medicine | Admitting: Emergency Medicine

## 2017-02-10 DIAGNOSIS — Z79899 Other long term (current) drug therapy: Secondary | ICD-10-CM | POA: Insufficient documentation

## 2017-02-10 DIAGNOSIS — R040 Epistaxis: Secondary | ICD-10-CM | POA: Insufficient documentation

## 2017-02-10 DIAGNOSIS — I10 Essential (primary) hypertension: Secondary | ICD-10-CM

## 2017-02-10 LAB — CBC WITH DIFFERENTIAL/PLATELET
Basophils Absolute: 0 10*3/uL (ref 0–0.1)
Basophils Relative: 1 %
EOS ABS: 0 10*3/uL (ref 0–0.7)
EOS PCT: 1 %
HCT: 30 % — ABNORMAL LOW (ref 35.0–47.0)
Hemoglobin: 9.9 g/dL — ABNORMAL LOW (ref 12.0–16.0)
LYMPHS ABS: 0.7 10*3/uL — AB (ref 1.0–3.6)
LYMPHS PCT: 13 %
MCH: 33.4 pg (ref 26.0–34.0)
MCHC: 33 g/dL (ref 32.0–36.0)
MCV: 101.4 fL — AB (ref 80.0–100.0)
MONO ABS: 0.4 10*3/uL (ref 0.2–0.9)
MONOS PCT: 7 %
Neutro Abs: 4.3 10*3/uL (ref 1.4–6.5)
Neutrophils Relative %: 80 %
PLATELETS: 270 10*3/uL (ref 150–440)
RBC: 2.96 MIL/uL — ABNORMAL LOW (ref 3.80–5.20)
RDW: 17.5 % — ABNORMAL HIGH (ref 11.5–14.5)
WBC: 5.4 10*3/uL (ref 3.6–11.0)

## 2017-02-10 LAB — COMPREHENSIVE METABOLIC PANEL
ALBUMIN: 2.9 g/dL — AB (ref 3.5–5.0)
ALT: 9 U/L — AB (ref 14–54)
AST: 18 U/L (ref 15–41)
Alkaline Phosphatase: 91 U/L (ref 38–126)
Anion gap: 13 (ref 5–15)
BUN: 19 mg/dL (ref 6–20)
CHLORIDE: 99 mmol/L — AB (ref 101–111)
CO2: 25 mmol/L (ref 22–32)
CREATININE: 1.4 mg/dL — AB (ref 0.44–1.00)
Calcium: 9.5 mg/dL (ref 8.9–10.3)
GFR calc Af Amer: 39 mL/min — ABNORMAL LOW (ref >60)
GFR, EST NON AFRICAN AMERICAN: 33 mL/min — AB (ref >60)
GLUCOSE: 107 mg/dL — AB (ref 65–99)
Potassium: 3.7 mmol/L (ref 3.5–5.1)
SODIUM: 137 mmol/L (ref 135–145)
Total Bilirubin: 1.5 mg/dL — ABNORMAL HIGH (ref 0.3–1.2)
Total Protein: 10.2 g/dL — ABNORMAL HIGH (ref 6.5–8.1)

## 2017-02-10 NOTE — Discharge Instructions (Signed)
Follow-up with your primary care doctor at [facility] if any continued problems.  Read information about nosebleeds.  Take blood pressure medication daily and have your blood pressure rechecked by your doctor.  Follow-up with Dr. Kathyrn Sheriff if any continued problems with nosebleeds.

## 2017-02-10 NOTE — ED Triage Notes (Addendum)
Patient presents to ED via POV from home with family post epistaxis. Family reports nose bleed lasted 45 minutes. Family also report nose bleed yesterday. Patient denies pain at this time. No active bleeding.

## 2017-02-10 NOTE — ED Provider Notes (Signed)
[facility] Emergency Department Provider Note   ____________________________________________   First MD Initiated Contact with Patient 02/10/17 1412     (approximate)  I have reviewed the triage vital signs and the nursing notes.   HISTORY  Chief Complaint Epistaxis History by patient and family.  HPI Theresa Lynch is a 84 y.o. female is brought in today by family after having a nosebleed earlier this morning.  Family states that nosebleed lasted approximately 45 minutes.  EMS was called and family reports that the right nostril was packed.  Patient also had a nosebleed yesterday that resolved.  Patient denies any pain, trauma or nasal congestion.  There is no active bleeding at this time.  She does not take any blood thinners.   Past Medical History:  Diagnosis Date  . Arthritis    hands  . Claustrophobia   . Depression    with anxiety/h/o agoraphobia  . Glaucoma   . HOH (hard of hearing)   . Hyperlipidemia   . Hypertension   . PONV (postoperative nausea and vomiting)     Patient Active Problem List   Diagnosis Date Noted  . Benign paroxysmal positional vertigo 09/27/2012  . Leucocytosis 08/23/2012  . TBI (traumatic brain injury) ([place]) 08/21/2012  . Hypokalemia 08/21/2012  . Hyponatremia 08/21/2012  . Syncope 08/21/2012  . Chest pain 08/21/2012  . Fall 08/18/2012  . Acute respiratory failure ([place]) 08/18/2012  . Acute blood loss anemia 08/18/2012  . Intracerebral hemorrhage ([place]) 08/15/2012  . Subarachnoid hemorrhage ([place]) 08/15/2012  . Closed basilar skull fracture with subarachnoid hemorrhage ([place]) 08/15/2012  . Closed fracture of facial bones ([place]) 08/15/2012    Past Surgical History:  Procedure Laterality Date  . CATARACT EXTRACTION W/PHACO Right 03/15/2015   Procedure: CATARACT EXTRACTION PHACO AND INTRAOCULAR LENS PLACEMENT (IOC);  Surgeon: Ronnell Freshwater, MD;  Location: [place];  Service:  Ophthalmology;  Laterality: Right;  . ROTATOR CUFF REPAIR  1999   bilateral    Prior to Admission medications   Medication Sig Start Date End Date Taking? Authorizing Provider  atorvastatin (LIPITOR) 10 MG tablet Take 10 mg by mouth daily.    [provider]  metoprolol tartrate (LOPRESSOR) 25 MG tablet Take 1 tablet (25 mg total) by mouth 2 (two) times daily. 09/05/12   Love, Ivan Anchors, PA-C  PARoxetine (PAXIL) 20 MG tablet Take 40 mg by mouth at bedtime. 09/05/12   Love, Ivan Anchors, PA-C  timolol (TIMOPTIC) 0.5 % ophthalmic solution Place 1 drop into both eyes at bedtime.    [provider]  traZODone (DESYREL) 50 MG tablet Take 50 mg by mouth at bedtime.    [provider]    Allergies Codeine and Penicillins  No family history on file.  Social History Social History   Tobacco Use  . Smoking status: Never Smoker  Substance Use Topics  . Alcohol use: Yes    Alcohol/week: 4.2 oz    Types: 7 Glasses of wine per week  . Drug use: No    Review of Systems Constitutional: No fever/chills Eyes: No visual changes. ENT: No sore throat.  Positive for nosebleed. Cardiovascular: Denies chest pain. Respiratory: Denies shortness of breath. Gastrointestinal:   No nausea, no vomiting.  Skin: Negative for rash. Neurological: Negative for headaches ____________________________________________   PHYSICAL EXAM:  VITAL SIGNS: ED Triage Vitals  Enc Vitals Group     BP 02/10/17 1324 (!) 170/86     Pulse Rate 02/10/17 1324 84  Resp 02/10/17 1324 17     Temp --      Temp src --      SpO2 02/10/17 1324 99 %     Weight 02/10/17 1325 140 lb (63.5 kg)     Height --      Head Circumference --      Peak Flow --      Pain Score --      Pain Loc --      Pain Edu? --      Excl. in  [place]? --    Constitutional: Alert and oriented. Well appearing and in no acute distress.  Patient is hard of hearing. Eyes: Conjunctivae are normal.  Head: Atraumatic. Nose: No  congestion/rhinnorhea.  No packing was noted.  There is no anterior evidence of bleeding in the right nostril. Mouth/Throat: Mucous membranes are moist.  Oropharynx non-erythematous.  No evidence of bleeding noted on the posterior pharynx. Neck: No stridor.   Hematological/Lymphatic/Immunilogical: No cervical lymphadenopathy. Cardiovascular: Normal rate, regular rhythm. Grossly normal heart sounds.  Good peripheral circulation. Respiratory: Normal respiratory effort.  No retractions. Lungs CTAB. Neurologic:  Normal speech and language. No gross focal neurologic deficits are appreciated.  Skin:  Skin is warm, dry and intact. No rash noted. Psychiatric: Mood and affect are normal. Speech and behavior are normal.  ____________________________________________   LABS (all labs ordered are listed, but only abnormal results are displayed)  Labs Reviewed  COMPREHENSIVE METABOLIC PANEL - Abnormal; Notable for the following components:      Result Value   Chloride 99 (*)    Glucose, Bld 107 (*)    Creatinine, Ser 1.40 (*)    Total Protein 10.2 (*)    Albumin 2.9 (*)    ALT 9 (*)    Total Bilirubin 1.5 (*)    GFR calc non Af Amer 33 (*)    GFR calc Af Amer 39 (*)    All other components within normal limits  CBC WITH DIFFERENTIAL/PLATELET - Abnormal; Notable for the following components:   RBC 2.96 (*)    Hemoglobin 9.9 (*)    HCT 30.0 (*)    MCV 101.4 (*)    RDW 17.5 (*)    Lymphs Abs 0.7 (*)    All other components within normal limits    PROCEDURES  Procedure(s) performed: None  Procedures  Critical Care performed: No  ____________________________________________   INITIAL IMPRESSION / ASSESSMENT AND PLAN / ED COURSE Patient did not have any recurrent epistaxis while in the emergency department.  Lab work was in keeping with her results that was reviewed from [facility]  Patient has history of anemia, acute renal  insufficiency and hypertension.  Family is not certain  if patient took her blood pressure medication today.  They are encouraged to follow-up with her PCP for recheck of her blood pressure.  If she continues to have nosebleeds she will follow-up with Dr. Kathyrn Sheriff.   ____________________________________________   FINAL CLINICAL IMPRESSION(S) / ED DIAGNOSES  Final diagnoses:  Epistaxis  Hypertension, unspecified type     ED Discharge Orders    None       Note:  This document was prepared using Dragon voice recognition software and may include unintentional dictation errors.    Johnn Hai, PA-C 02/10/17 1613    Lisa Roca, MD 02/11/17 1536

## 2017-02-12 ENCOUNTER — Other Ambulatory Visit: Payer: Self-pay

## 2017-02-13 DIAGNOSIS — I10 Essential (primary) hypertension: Secondary | ICD-10-CM | POA: Diagnosis not present

## 2017-02-13 DIAGNOSIS — E7800 Pure hypercholesterolemia, unspecified: Secondary | ICD-10-CM | POA: Diagnosis not present

## 2017-02-13 DIAGNOSIS — R7303 Prediabetes: Secondary | ICD-10-CM | POA: Diagnosis not present

## 2017-02-13 DIAGNOSIS — F330 Major depressive disorder, recurrent, mild: Secondary | ICD-10-CM | POA: Diagnosis not present

## 2017-02-13 DIAGNOSIS — D539 Nutritional anemia, unspecified: Secondary | ICD-10-CM | POA: Diagnosis not present

## 2017-02-13 DIAGNOSIS — N183 Chronic kidney disease, stage 3 (moderate): Secondary | ICD-10-CM | POA: Diagnosis not present

## 2017-05-16 ENCOUNTER — Encounter: Payer: Self-pay | Admitting: Emergency Medicine

## 2017-05-16 ENCOUNTER — Other Ambulatory Visit: Payer: Self-pay

## 2017-05-16 ENCOUNTER — Emergency Department
Admission: EM | Admit: 2017-05-16 | Discharge: 2017-05-16 | Disposition: A | Discharge status: Home / Self Care | Payer: PPO | Attending: Emergency Medicine | Admitting: Emergency Medicine

## 2017-05-16 DIAGNOSIS — I10 Essential (primary) hypertension: Secondary | ICD-10-CM | POA: Insufficient documentation

## 2017-05-16 DIAGNOSIS — R040 Epistaxis: Secondary | ICD-10-CM

## 2017-05-16 MED ORDER — PHENYLEPHRINE HCL 0.5 % NA SOLN
NASAL | Status: AC
  Filled 2017-05-16: qty 15

## 2017-05-16 NOTE — ED Notes (Signed)
No bleeding, pt feels well. Walked to bathroom in hallway

## 2017-05-16 NOTE — Discharge Instructions (Signed)
Please take all nosebleed precautions that we discussed, including using her humidifier, and gently applying a small amount of Vaseline to the inside of the nose 3 times daily.  Do not blow your nose, put your finger in your nose, or put any other foreign bodies in your nose.  Please make a follow-up appointment with the ears nose and throat doctor.  Return to the emergency department for severe pain, lightheadedness or fainting, nosebleed they cannot stop, or for any other symptoms concerning to you.

## 2017-05-16 NOTE — ED Triage Notes (Signed)
Pt to triage via w/c with active nosebleed to right nare; st x 3 today with hx of same; nose clamp applied

## 2017-05-16 NOTE — ED Notes (Signed)
Nosebleed x 3 today

## 2017-05-16 NOTE — ED Provider Notes (Signed)
[facility] Emergency Department Provider Note  ____________________________________________  Time seen: Approximately 10:20 PM  I have reviewed the triage vital signs and the nursing notes.   HISTORY  Chief Complaint Epistaxis    HPI Theresa Lynch is a 84 y.o. female with a history of recurrent epistaxis presenting for right nare epistaxis.  The patient is not anticoagulated.  She reports 3 nosebleeds today, the first 1 at 5 AM which was stopped with compression.  The second 1 was in the middle of the day also stop with compression.  Many times, her nosebleeds start after she is blowing her nose.  This evening, she was unable to stop her nosebleed.  She has required ENT evaluation with evaluation for cauterization in the past.  She sleeps with a humidifier in her room.  She denies any lightheadedness or syncope, shortness of breath, or fever.  Past Medical History:  Diagnosis Date  . Arthritis    hands  . Claustrophobia   . Depression    with anxiety/h/o agoraphobia  . Glaucoma   . HOH (hard of hearing)   . Hyperlipidemia   . Hypertension   . PONV (postoperative nausea and vomiting)     Patient Active Problem List   Diagnosis Date Noted  . Benign paroxysmal positional vertigo 09/27/2012  . Leucocytosis 08/23/2012  . TBI (traumatic brain injury) ([place]) 08/21/2012  . Hypokalemia 08/21/2012  . Hyponatremia 08/21/2012  . Syncope 08/21/2012  . Chest pain 08/21/2012  . Fall 08/18/2012  . Acute respiratory failure ([place]) 08/18/2012  . Acute blood loss anemia 08/18/2012  . Intracerebral hemorrhage ([place]) 08/15/2012  . Subarachnoid hemorrhage ([place]) 08/15/2012  . Closed basilar skull fracture with subarachnoid hemorrhage ([place]) 08/15/2012  . Closed fracture of facial bones ([place]) 08/15/2012    Past Surgical History:  Procedure Laterality Date  . CATARACT EXTRACTION W/PHACO Right 03/15/2015   Procedure: CATARACT EXTRACTION PHACO AND INTRAOCULAR LENS  PLACEMENT (IOC);  Surgeon: Ronnell Freshwater, MD;  Location: [place];  Service: Ophthalmology;  Laterality: Right;  . ROTATOR CUFF REPAIR  1999   bilateral    Current Outpatient Rx  . Order #: [id number] Class: Historical Med  . Order #: [id number] Class: Print  . Order #: [id number] Class: Historical Med  . Order #: [id number] Class: Historical Med  . Order #: [id number] Class: Historical Med    Allergies Codeine and Penicillins  No family history on file.  Social History Social History   Tobacco Use  . Smoking status: Never Smoker  . Smokeless tobacco: Never Used  Substance Use Topics  . Alcohol use: Yes    Alcohol/week: 4.2 oz    Types: 7 Glasses of wine per week  . Drug use: No    Review of Systems Constitutional: No fever/chills.  No lightheadedness or syncope. Eyes: No visual changes. ENT: No sore throat. No congestion or rhinorrhea.  Positive right nare epistaxis. Cardiovascular: Denies chest pain. Denies palpitations. Respiratory: Denies shortness of breath.  No cough. Gastrointestinal: No abdominal pain.  No nausea, no vomiting.  No diarrhea.  No constipation. Musculoskeletal: Negative for neck pain. Skin: Negative for rash. Neurological: Negative for headaches. No focal numbness, tingling or weakness.     ____________________________________________   PHYSICAL EXAM:  VITAL SIGNS: ED Triage Vitals  Enc Vitals Group     BP 05/16/17 2126 (!) 186/93     Pulse Rate 05/16/17 2126 69     Resp 05/16/17 2126 18     Temp --      Temp  src --      SpO2 05/16/17 2126 100 %     Weight 05/16/17 2125 140 lb (63.5 kg)     Height 05/16/17 2125 5\' 6"  (1.676 m)     Head Circumference --      Peak Flow --      Pain Score 05/16/17 2126 0     Pain Loc --      Pain Edu? --      Excl. in [place]? --     Constitutional: Alert and oriented.  Conically ill appearing and in no acute distress. Answers questions appropriately. Eyes: Conjunctivae are normal.  EOMI. No  scleral icterus. Head: Atraumatic. Nose: The patient has no bleeding and mildly erythematous mucous membranes from the left nare.  The right nare has a fair amount of clot with active bleeding that is most likely anterior. Mouth/Throat: Mucous membranes are moist.  Patient has a small amount of blood in her mouth. Neck: No stridor.  Supple.   Cardiovascular: Normal rate, regular rhythm. No murmurs, rubs or gallops.  Respiratory: Normal respiratory effort.  No accessory muscle use or retractions. Lungs CTAB.  No wheezes, rales or ronchi. Musculoskeletal: Moves all extremities well. Neurologic:  A&Ox3.  Speech is clear.  Face and smile are symmetric.  EOMI.  Moves all extremities well. Skin:  Skin is warm, dry and intact. No rash noted. Psychiatric: Mood and affect are normal. Speech and behavior are normal.  Normal judgement.  ____________________________________________   LABS (all labs ordered are listed, but only abnormal results are displayed)  Labs Reviewed - No data to display ____________________________________________  EKG  Not indicated ____________________________________________  RADIOLOGY  No results found.  ____________________________________________   PROCEDURES  Procedure(s) performed: None  Procedures  Critical Care performed: No ____________________________________________   INITIAL IMPRESSION / ASSESSMENT AND PLAN / ED COURSE  Pertinent labs & imaging results that were available during my care of the patient were reviewed by me and considered in my medical decision making (see chart for details).  84 y.o. female with a history of recurrent epistaxis presenting with right nare epistaxis, the patient is not intact regulated.  Overall, the patient is mildly hypertensive at 186/93.  She is otherwise hemodynamically stable.  We have administered Neo-Synephrine to the right nare, and placed a ENT clip over the nose.  If we are able to get her hemostatic, I  have given her and her son and at length discussion about epistaxis prevention and treatment should this happen again at home.  We will plan to have her follow-up with the ears nose and throat surgeon.  Return precautions were discussed.  ----------------------------------------- 10:51 PM on 05/16/2017 -----------------------------------------  At this time, the patient notices no longer bleeding.  She is stable for discharge.  ____________________________________________  FINAL CLINICAL IMPRESSION(S) / ED DIAGNOSES  Final diagnoses:  Right-sided epistaxis         NEW MEDICATIONS STARTED DURING THIS VISIT:  New Prescriptions   No medications on file      Eula Listen, MD 05/16/17 2251

## 2017-05-18 DIAGNOSIS — R040 Epistaxis: Secondary | ICD-10-CM | POA: Diagnosis not present

## 2017-05-24 ENCOUNTER — Inpatient Hospital Stay
Admission: EM | Admit: 2017-05-24 | Discharge: 2017-05-31 | DRG: 956 | Disposition: A | Discharge status: Skilled Nursing Facility | Payer: PPO | Attending: Internal Medicine | Admitting: Internal Medicine

## 2017-05-24 ENCOUNTER — Emergency Department: Payer: PPO

## 2017-05-24 ENCOUNTER — Other Ambulatory Visit: Payer: Self-pay

## 2017-05-24 ENCOUNTER — Encounter: Payer: Self-pay | Admitting: Emergency Medicine

## 2017-05-24 DIAGNOSIS — N390 Urinary tract infection, site not specified: Secondary | ICD-10-CM | POA: Diagnosis not present

## 2017-05-24 DIAGNOSIS — Z419 Encounter for procedure for purposes other than remedying health state, unspecified: Secondary | ICD-10-CM

## 2017-05-24 DIAGNOSIS — I440 Atrioventricular block, first degree: Secondary | ICD-10-CM | POA: Diagnosis not present

## 2017-05-24 DIAGNOSIS — Z7401 Bed confinement status: Secondary | ICD-10-CM | POA: Diagnosis not present

## 2017-05-24 DIAGNOSIS — E785 Hyperlipidemia, unspecified: Secondary | ICD-10-CM | POA: Diagnosis present

## 2017-05-24 DIAGNOSIS — C9000 Multiple myeloma not having achieved remission: Secondary | ICD-10-CM

## 2017-05-24 DIAGNOSIS — Z880 Allergy status to penicillin: Secondary | ICD-10-CM

## 2017-05-24 DIAGNOSIS — S72142A Displaced intertrochanteric fracture of left femur, initial encounter for closed fracture: Secondary | ICD-10-CM | POA: Diagnosis present

## 2017-05-24 DIAGNOSIS — Z87820 Personal history of traumatic brain injury: Secondary | ICD-10-CM

## 2017-05-24 DIAGNOSIS — I10 Essential (primary) hypertension: Secondary | ICD-10-CM | POA: Diagnosis not present

## 2017-05-24 DIAGNOSIS — R410 Disorientation, unspecified: Secondary | ICD-10-CM | POA: Diagnosis present

## 2017-05-24 DIAGNOSIS — F0390 Unspecified dementia without behavioral disturbance: Secondary | ICD-10-CM | POA: Diagnosis present

## 2017-05-24 DIAGNOSIS — R791 Abnormal coagulation profile: Secondary | ICD-10-CM | POA: Diagnosis present

## 2017-05-24 DIAGNOSIS — Z66 Do not resuscitate: Secondary | ICD-10-CM | POA: Diagnosis present

## 2017-05-24 DIAGNOSIS — Z7982 Long term (current) use of aspirin: Secondary | ICD-10-CM | POA: Diagnosis not present

## 2017-05-24 DIAGNOSIS — R4182 Altered mental status, unspecified: Secondary | ICD-10-CM | POA: Diagnosis not present

## 2017-05-24 DIAGNOSIS — N183 Chronic kidney disease, stage 3 (moderate): Secondary | ICD-10-CM | POA: Diagnosis present

## 2017-05-24 DIAGNOSIS — R7989 Other specified abnormal findings of blood chemistry: Secondary | ICD-10-CM | POA: Diagnosis not present

## 2017-05-24 DIAGNOSIS — F418 Other specified anxiety disorders: Secondary | ICD-10-CM | POA: Diagnosis not present

## 2017-05-24 DIAGNOSIS — E876 Hypokalemia: Secondary | ICD-10-CM | POA: Diagnosis present

## 2017-05-24 DIAGNOSIS — S3282XD Multiple fractures of pelvis without disruption of pelvic ring, subsequent encounter for fracture with routine healing: Secondary | ICD-10-CM | POA: Diagnosis not present

## 2017-05-24 DIAGNOSIS — E538 Deficiency of other specified B group vitamins: Secondary | ICD-10-CM | POA: Diagnosis not present

## 2017-05-24 DIAGNOSIS — S3289XA Fracture of other parts of pelvis, initial encounter for closed fracture: Secondary | ICD-10-CM | POA: Diagnosis not present

## 2017-05-24 DIAGNOSIS — M899 Disorder of bone, unspecified: Secondary | ICD-10-CM | POA: Diagnosis not present

## 2017-05-24 DIAGNOSIS — E559 Vitamin D deficiency, unspecified: Secondary | ICD-10-CM | POA: Diagnosis not present

## 2017-05-24 DIAGNOSIS — W19XXXD Unspecified fall, subsequent encounter: Secondary | ICD-10-CM | POA: Diagnosis not present

## 2017-05-24 DIAGNOSIS — N289 Disorder of kidney and ureter, unspecified: Secondary | ICD-10-CM | POA: Diagnosis not present

## 2017-05-24 DIAGNOSIS — H409 Unspecified glaucoma: Secondary | ICD-10-CM | POA: Diagnosis not present

## 2017-05-24 DIAGNOSIS — S3282XA Multiple fractures of pelvis without disruption of pelvic ring, initial encounter for closed fracture: Secondary | ICD-10-CM | POA: Diagnosis present

## 2017-05-24 DIAGNOSIS — S72142D Displaced intertrochanteric fracture of left femur, subsequent encounter for closed fracture with routine healing: Secondary | ICD-10-CM | POA: Diagnosis not present

## 2017-05-24 DIAGNOSIS — R42 Dizziness and giddiness: Secondary | ICD-10-CM | POA: Diagnosis not present

## 2017-05-24 DIAGNOSIS — D631 Anemia in chronic kidney disease: Secondary | ICD-10-CM | POA: Diagnosis present

## 2017-05-24 DIAGNOSIS — R918 Other nonspecific abnormal finding of lung field: Secondary | ICD-10-CM | POA: Diagnosis not present

## 2017-05-24 DIAGNOSIS — F419 Anxiety disorder, unspecified: Secondary | ICD-10-CM | POA: Diagnosis present

## 2017-05-24 DIAGNOSIS — W19XXXA Unspecified fall, initial encounter: Secondary | ICD-10-CM | POA: Diagnosis not present

## 2017-05-24 DIAGNOSIS — H9190 Unspecified hearing loss, unspecified ear: Secondary | ICD-10-CM | POA: Diagnosis present

## 2017-05-24 DIAGNOSIS — S72002A Fracture of unspecified part of neck of left femur, initial encounter for closed fracture: Secondary | ICD-10-CM | POA: Diagnosis present

## 2017-05-24 DIAGNOSIS — I129 Hypertensive chronic kidney disease with stage 1 through stage 4 chronic kidney disease, or unspecified chronic kidney disease: Secondary | ICD-10-CM | POA: Diagnosis present

## 2017-05-24 DIAGNOSIS — Y92003 Bedroom of unspecified non-institutional (private) residence as the place of occurrence of the external cause: Secondary | ICD-10-CM

## 2017-05-24 DIAGNOSIS — W010XXA Fall on same level from slipping, tripping and stumbling without subsequent striking against object, initial encounter: Secondary | ICD-10-CM | POA: Diagnosis present

## 2017-05-24 DIAGNOSIS — M25552 Pain in left hip: Secondary | ICD-10-CM | POA: Diagnosis not present

## 2017-05-24 DIAGNOSIS — E039 Hypothyroidism, unspecified: Secondary | ICD-10-CM | POA: Diagnosis not present

## 2017-05-24 DIAGNOSIS — C9002 Multiple myeloma in relapse: Secondary | ICD-10-CM

## 2017-05-24 DIAGNOSIS — D649 Anemia, unspecified: Secondary | ICD-10-CM | POA: Diagnosis not present

## 2017-05-24 DIAGNOSIS — M1990 Unspecified osteoarthritis, unspecified site: Secondary | ICD-10-CM | POA: Diagnosis not present

## 2017-05-24 DIAGNOSIS — S329XXA Fracture of unspecified parts of lumbosacral spine and pelvis, initial encounter for closed fracture: Secondary | ICD-10-CM

## 2017-05-24 DIAGNOSIS — F329 Major depressive disorder, single episode, unspecified: Secondary | ICD-10-CM | POA: Diagnosis present

## 2017-05-24 DIAGNOSIS — S72009A Fracture of unspecified part of neck of unspecified femur, initial encounter for closed fracture: Secondary | ICD-10-CM | POA: Diagnosis present

## 2017-05-24 LAB — CBC WITH DIFFERENTIAL/PLATELET
BASOS ABS: 0 10*3/uL (ref 0–0.1)
Basophils Relative: 1 %
Eosinophils Absolute: 0 10*3/uL (ref 0–0.7)
Eosinophils Relative: 1 %
HCT: 20.5 % — ABNORMAL LOW (ref 35.0–47.0)
Hemoglobin: 6.9 g/dL — ABNORMAL LOW (ref 12.0–16.0)
LYMPHS ABS: 1.1 10*3/uL (ref 1.0–3.6)
LYMPHS PCT: 21 %
MCH: 33.4 pg (ref 26.0–34.0)
MCHC: 33.8 g/dL (ref 32.0–36.0)
MCV: 98.6 fL (ref 80.0–100.0)
MONO ABS: 0.5 10*3/uL (ref 0.2–0.9)
Monocytes Relative: 10 %
NEUTROS ABS: 3.7 10*3/uL (ref 1.4–6.5)
Neutrophils Relative %: 67 %
PLATELETS: 277 10*3/uL (ref 150–440)
RBC: 2.08 MIL/uL — AB (ref 3.80–5.20)
RDW: 19.6 % — AB (ref 11.5–14.5)
WBC: 5.4 10*3/uL (ref 3.6–11.0)

## 2017-05-24 LAB — PROTIME-INR
INR: 1.73
Prothrombin Time: 20.1 seconds — ABNORMAL HIGH (ref 11.4–15.2)

## 2017-05-24 LAB — BASIC METABOLIC PANEL
Anion gap: 11 (ref 5–15)
BUN: 18 mg/dL (ref 6–20)
CALCIUM: 8.5 mg/dL — AB (ref 8.9–10.3)
CO2: 24 mmol/L (ref 22–32)
Chloride: 100 mmol/L — ABNORMAL LOW (ref 101–111)
Creatinine, Ser: 1.14 mg/dL — ABNORMAL HIGH (ref 0.44–1.00)
GFR, EST AFRICAN AMERICAN: 49 mL/min — AB (ref >60)
GFR, EST NON AFRICAN AMERICAN: 43 mL/min — AB (ref >60)
Glucose, Bld: 105 mg/dL — ABNORMAL HIGH (ref 65–99)
Potassium: 4.3 mmol/L (ref 3.5–5.1)
SODIUM: 135 mmol/L (ref 135–145)

## 2017-05-24 LAB — APTT: APTT: 39 s — AB (ref 24–36)

## 2017-05-24 MED ORDER — ONDANSETRON HCL 4 MG/2ML IJ SOLN
4.0000 mg | Freq: Once | INTRAMUSCULAR | Status: AC
  Administered 2017-05-24: 4 mg via INTRAVENOUS
  Filled 2017-05-24: qty 2

## 2017-05-24 MED ORDER — FENTANYL CITRATE (PF) 100 MCG/2ML IJ SOLN
50.0000 ug | Freq: Once | INTRAMUSCULAR | Status: AC
  Administered 2017-05-24: 50 ug via INTRAVENOUS
  Filled 2017-05-24: qty 2

## 2017-05-24 NOTE — ED Notes (Signed)
Recollect of PT/INR and Type and Screen sent to lab

## 2017-05-24 NOTE — ED Provider Notes (Signed)
[facility] Emergency Department Provider Note  ___________________________________________   First MD Initiated Contact with Patient 05/24/17 2200     (approximate)  I have reviewed the triage vital signs and the nursing notes.   HISTORY  Chief Complaint Fall and Hip Pain   HPI Theresa Lynch is a 84 y.o. female with a history of hypertension as well as intracranial hemorrhage not on any blood thinners at this time who is presenting after losing her balance walking with her cane and falling on her left hip.  Says that she also hit her head but denies any headache at this time.  Complains of left hip pain and says that she fell just prior to arrival.  Has been unable to walk or move the left lower extremity.   Past Medical History:  Diagnosis Date  . Arthritis    hands  . Claustrophobia   . Depression    with anxiety/h/o agoraphobia  . Glaucoma   . HOH (hard of hearing)   . Hyperlipidemia   . Hypertension   . PONV (postoperative nausea and vomiting)     Patient Active Problem List   Diagnosis Date Noted  . Benign paroxysmal positional vertigo 09/27/2012  . Leucocytosis 08/23/2012  . TBI (traumatic brain injury) ([place]) 08/21/2012  . Hypokalemia 08/21/2012  . Hyponatremia 08/21/2012  . Syncope 08/21/2012  . Chest pain 08/21/2012  . Fall 08/18/2012  . Acute respiratory failure ([place]) 08/18/2012  . Acute blood loss anemia 08/18/2012  . Intracerebral hemorrhage ([place]) 08/15/2012  . Subarachnoid hemorrhage ([place]) 08/15/2012  . Closed basilar skull fracture with subarachnoid hemorrhage ([place]) 08/15/2012  . Closed fracture of facial bones ([place]) 08/15/2012    Past Surgical History:  Procedure Laterality Date  . CATARACT EXTRACTION W/PHACO Right 03/15/2015   Procedure: CATARACT EXTRACTION PHACO AND INTRAOCULAR LENS PLACEMENT (IOC);  Surgeon: Ronnell Freshwater, MD;  Location: [place];  Service: Ophthalmology;  Laterality: Right;    . ROTATOR CUFF REPAIR  1999   bilateral    Prior to Admission medications   Medication Sig Start Date End Date Taking? Authorizing Provider  atorvastatin (LIPITOR) 10 MG tablet Take 10 mg by mouth daily.    [provider]  metoprolol tartrate (LOPRESSOR) 25 MG tablet Take 1 tablet (25 mg total) by mouth 2 (two) times daily. 09/05/12   Love, Ivan Anchors, PA-C  PARoxetine (PAXIL) 20 MG tablet Take 40 mg by mouth at bedtime. 09/05/12   Love, Ivan Anchors, PA-C  timolol (TIMOPTIC) 0.5 % ophthalmic solution Place 1 drop into both eyes at bedtime.    [provider]  traZODone (DESYREL) 50 MG tablet Take 50 mg by mouth at bedtime.    [provider]    Allergies Codeine and Penicillins  No family history on file.  Social History Social History   Tobacco Use  . Smoking status: Never Smoker  . Smokeless tobacco: Never Used  Substance Use Topics  . Alcohol use: Yes    Alcohol/week: 4.2 oz    Types: 7 Glasses of wine per week  . Drug use: No    Review of Systems  Constitutional: No fever/chills Eyes: No visual changes. ENT: No sore throat. Cardiovascular: Denies chest pain. Respiratory: Denies shortness of breath. Gastrointestinal: No abdominal pain.  No nausea, no vomiting.  No diarrhea.  No constipation. Genitourinary: Negative for dysuria. Musculoskeletal: Negative for back pain. Skin: Negative for rash. Neurological: Negative for headaches, focal weakness or numbness.   ____________________________________________  PHYSICAL EXAM:  VITAL SIGNS: ED Triage Vitals  Enc Vitals Group     BP 05/24/17 2205 (!) 167/107     Pulse Rate 05/24/17 2205 87     Resp 05/24/17 2205 16     Temp 05/24/17 2205 97.9 F (36.6 C)     Temp Source 05/24/17 2205 Oral     SpO2 05/24/17 2202 98 %     Weight 05/24/17 2206 140 lb (63.5 kg)     Height 05/24/17 2206 5\' 6"  (1.676 m)     Head Circumference --      Peak Flow --      Pain Score 05/24/17 2206 9     Pain Loc  --      Pain Edu? --      Excl. in [place]? --     Constitutional: Alert and oriented. Well appearing and in no acute distress. Eyes: Conjunctivae are normal.  Head: Atraumatic. Nose: No congestion/rhinnorhea. Mouth/Throat: Mucous membranes are moist.  Neck: No stridor.  No tenderness to palpation to the midline cervical spine. Cardiovascular: Normal rate, regular rhythm. Grossly normal heart sounds.  Good peripheral circulation with equal and bilateral dorsalis pedis pulses. Respiratory: Normal respiratory effort.  No retractions. Lungs CTAB. Gastrointestinal: Soft and nontender. No distention. No CVA tenderness. Musculoskeletal: Tenderness to palpation over the left hip laterally.  Externally rotated left lower extremity.  Able to range her bilateral toes as well as sensate to light touch the bilateral feet.  Bilateral dorsalis pedis pulses are intact. Neurologic:  Normal speech and language. No gross focal neurologic deficits are appreciated. Skin:  Skin is warm, dry and intact. No rash noted. Psychiatric: Mood and affect are normal. Speech and behavior are normal.  ____________________________________________   LABS (all labs ordered are listed, but only abnormal results are displayed)  Labs Reviewed  CBC WITH DIFFERENTIAL/PLATELET - Abnormal; Notable for the following components:      Result Value   RBC 2.08 (*)    Hemoglobin 6.9 (*)    HCT 20.5 (*)    RDW 19.6 (*)    All other components within normal limits  BASIC METABOLIC PANEL - Abnormal; Notable for the following components:   Chloride 100 (*)    Glucose, Bld 105 (*)    Creatinine, Ser 1.14 (*)    Calcium 8.5 (*)    GFR calc non Af Amer 43 (*)    GFR calc Af Amer 49 (*)    All other components within normal limits  PROTIME-INR - Abnormal; Notable for the following components:   Prothrombin Time 20.1 (*)    All other components within normal limits  APTT - Abnormal; Notable for the following components:   aPTT 39 (*)     All other components within normal limits  TYPE AND SCREEN  TYPE AND SCREEN   ____________________________________________  EKG  ED ECG REPORT I, Doran Stabler, the attending physician, personally viewed and interpreted this ECG.   Date: 05/24/2017  EKG Time: 2213  Rate: 74  Rhythm: normal sinus rhythm  Axis: Normal  Intervals: First-degree AV block.  Prolonged QT interval.  ST&T Change: No ST segment elevation or depression.  Scooped/downsloping T waves in 2, 3 as well as aVF as well as V5 and V6. No significant change from previous. ____________________________________________  RADIOLOGY  Multiple little lesions found on the CT of the brain.  Left intertrochanteric fracture with also suspected left superior and inferior pubic rami fracture. ____________________________________________   PROCEDURES  Procedure(s) performed:  Procedures  Critical Care performed:   ____________________________________________   INITIAL IMPRESSION / ASSESSMENT AND PLAN / ED COURSE  Pertinent labs & imaging results that were available during my care of the patient were reviewed by me and considered in my medical decision making (see chart for details).  DDX: Hip fracture, hip contusion, pelvic fracture, intra-cranial hemorrhage As part of my medical decision making, I reviewed the following data within the [facility] ED records  ----------------------------------------- 11:51 PM on 05/24/2017 -----------------------------------------  Family as well as patient updated as to the diagnosis as well as need for admission.  Discussed case with Dr. Mack Guise of orthopedics who agreed to see the patient is a consult.  Signed out to Dr. Marcille Blanco of the medicine service.  Patient declining of pain.  We will re-dose her fentanyl. ____________________________________________   FINAL CLINICAL IMPRESSION(S) / ED DIAGNOSES  Left-sided intertrochanteric hip  fracture.  Pelvic fracture.    NEW MEDICATIONS STARTED DURING THIS VISIT:  New Prescriptions   No medications on file     Note:  This document was prepared using Dragon voice recognition software and may include unintentional dictation errors.     Orbie Pyo, MD 05/24/17 2352

## 2017-05-24 NOTE — ED Triage Notes (Signed)
Patient form home via [facility]. Reports she was walking through bedroom with her cane and got dizzy and lost her balance landing on her left side. Patient with outward rotation noted to left leg. Patient denies hitting head or LOC. Alert and oriented x4.

## 2017-05-25 ENCOUNTER — Inpatient Hospital Stay: Payer: PPO

## 2017-05-25 ENCOUNTER — Inpatient Hospital Stay: Payer: PPO | Admitting: Anesthesiology

## 2017-05-25 ENCOUNTER — Encounter: Payer: Self-pay | Admitting: Anesthesiology

## 2017-05-25 ENCOUNTER — Encounter
Admission: EM | Disposition: A | Discharge status: Skilled Nursing Facility | Payer: Self-pay | Source: Home / Self Care | Attending: Internal Medicine

## 2017-05-25 DIAGNOSIS — I129 Hypertensive chronic kidney disease with stage 1 through stage 4 chronic kidney disease, or unspecified chronic kidney disease: Secondary | ICD-10-CM | POA: Diagnosis present

## 2017-05-25 DIAGNOSIS — S72002A Fracture of unspecified part of neck of left femur, initial encounter for closed fracture: Secondary | ICD-10-CM | POA: Diagnosis present

## 2017-05-25 DIAGNOSIS — D631 Anemia in chronic kidney disease: Secondary | ICD-10-CM | POA: Diagnosis present

## 2017-05-25 DIAGNOSIS — N183 Chronic kidney disease, stage 3 (moderate): Secondary | ICD-10-CM | POA: Diagnosis present

## 2017-05-25 DIAGNOSIS — M899 Disorder of bone, unspecified: Secondary | ICD-10-CM | POA: Diagnosis not present

## 2017-05-25 DIAGNOSIS — H9190 Unspecified hearing loss, unspecified ear: Secondary | ICD-10-CM | POA: Diagnosis present

## 2017-05-25 DIAGNOSIS — D649 Anemia, unspecified: Secondary | ICD-10-CM | POA: Diagnosis not present

## 2017-05-25 DIAGNOSIS — F419 Anxiety disorder, unspecified: Secondary | ICD-10-CM | POA: Diagnosis present

## 2017-05-25 DIAGNOSIS — S3282XA Multiple fractures of pelvis without disruption of pelvic ring, initial encounter for closed fracture: Secondary | ICD-10-CM | POA: Diagnosis present

## 2017-05-25 DIAGNOSIS — R791 Abnormal coagulation profile: Secondary | ICD-10-CM | POA: Diagnosis present

## 2017-05-25 DIAGNOSIS — Z66 Do not resuscitate: Secondary | ICD-10-CM | POA: Diagnosis present

## 2017-05-25 DIAGNOSIS — R410 Disorientation, unspecified: Secondary | ICD-10-CM | POA: Diagnosis present

## 2017-05-25 DIAGNOSIS — F0390 Unspecified dementia without behavioral disturbance: Secondary | ICD-10-CM | POA: Diagnosis present

## 2017-05-25 DIAGNOSIS — C9000 Multiple myeloma not having achieved remission: Secondary | ICD-10-CM | POA: Diagnosis present

## 2017-05-25 DIAGNOSIS — E876 Hypokalemia: Secondary | ICD-10-CM | POA: Diagnosis present

## 2017-05-25 DIAGNOSIS — Y92003 Bedroom of unspecified non-institutional (private) residence as the place of occurrence of the external cause: Secondary | ICD-10-CM | POA: Diagnosis not present

## 2017-05-25 DIAGNOSIS — N289 Disorder of kidney and ureter, unspecified: Secondary | ICD-10-CM | POA: Diagnosis not present

## 2017-05-25 DIAGNOSIS — S72009A Fracture of unspecified part of neck of unspecified femur, initial encounter for closed fracture: Secondary | ICD-10-CM | POA: Diagnosis present

## 2017-05-25 DIAGNOSIS — F329 Major depressive disorder, single episode, unspecified: Secondary | ICD-10-CM | POA: Diagnosis present

## 2017-05-25 DIAGNOSIS — Z87820 Personal history of traumatic brain injury: Secondary | ICD-10-CM | POA: Diagnosis not present

## 2017-05-25 DIAGNOSIS — W010XXA Fall on same level from slipping, tripping and stumbling without subsequent striking against object, initial encounter: Secondary | ICD-10-CM | POA: Diagnosis present

## 2017-05-25 DIAGNOSIS — E785 Hyperlipidemia, unspecified: Secondary | ICD-10-CM | POA: Diagnosis present

## 2017-05-25 DIAGNOSIS — S72142A Displaced intertrochanteric fracture of left femur, initial encounter for closed fracture: Secondary | ICD-10-CM | POA: Diagnosis present

## 2017-05-25 DIAGNOSIS — Z880 Allergy status to penicillin: Secondary | ICD-10-CM | POA: Diagnosis not present

## 2017-05-25 HISTORY — PX: INTRAMEDULLARY (IM) NAIL INTERTROCHANTERIC: SHX5875

## 2017-05-25 LAB — IRON AND TIBC
Iron: 50 ug/dL (ref 28–170)
Saturation Ratios: 20 % (ref 10.4–31.8)
TIBC: 252 ug/dL (ref 250–450)
UIBC: 202 ug/dL

## 2017-05-25 LAB — POCT I-STAT 4, (NA,K, GLUC, HGB,HCT)
Glucose, Bld: 141 mg/dL — ABNORMAL HIGH (ref 65–99)
HCT: 30 % — ABNORMAL LOW (ref 36.0–46.0)
Hemoglobin: 10.2 g/dL — ABNORMAL LOW (ref 12.0–15.0)
Potassium: 4.3 mmol/L (ref 3.5–5.1)
Sodium: 141 mmol/L (ref 135–145)

## 2017-05-25 LAB — SURGICAL PCR SCREEN
MRSA, PCR: NEGATIVE
Staphylococcus aureus: POSITIVE — AB

## 2017-05-25 LAB — HEMOGLOBIN AND HEMATOCRIT, BLOOD
HEMATOCRIT: 28.6 % — AB (ref 35.0–47.0)
Hemoglobin: 9.7 g/dL — ABNORMAL LOW (ref 12.0–16.0)

## 2017-05-25 LAB — FERRITIN: Ferritin: 149 ng/mL (ref 11–307)

## 2017-05-25 LAB — PROTIME-INR
INR: 1.85
PROTHROMBIN TIME: 21.2 s — AB (ref 11.4–15.2)

## 2017-05-25 LAB — ABO/RH: ABO/RH(D): A POS

## 2017-05-25 LAB — APTT: APTT: 37 s — AB (ref 24–36)

## 2017-05-25 LAB — TSH: TSH: 9.393 u[IU]/mL — ABNORMAL HIGH (ref 0.350–4.500)

## 2017-05-25 SURGERY — FIXATION, FRACTURE, INTERTROCHANTERIC, WITH INTRAMEDULLARY ROD
Anesthesia: General | Laterality: Left

## 2017-05-25 MED ORDER — ACETAMINOPHEN 500 MG PO TABS
500.0000 mg | ORAL_TABLET | Freq: Four times a day (QID) | ORAL | Status: AC
  Administered 2017-05-25 – 2017-05-26 (×4): 500 mg via ORAL
  Filled 2017-05-25 (×4): qty 1

## 2017-05-25 MED ORDER — FENTANYL CITRATE (PF) 100 MCG/2ML IJ SOLN
INTRAMUSCULAR | Status: AC
  Filled 2017-05-25: qty 2

## 2017-05-25 MED ORDER — NEOMYCIN-POLYMYXIN B GU 40-200000 IR SOLN
Status: DC | PRN
  Administered 2017-05-25: 1000 mL

## 2017-05-25 MED ORDER — PANTOPRAZOLE SODIUM 40 MG PO TBEC
40.0000 mg | DELAYED_RELEASE_TABLET | Freq: Every day | ORAL | Status: DC
  Administered 2017-05-26: 40 mg via ORAL
  Filled 2017-05-25: qty 1

## 2017-05-25 MED ORDER — DEXAMETHASONE SODIUM PHOSPHATE 10 MG/ML IJ SOLN
INTRAMUSCULAR | Status: DC | PRN
  Administered 2017-05-25: 5 mg via INTRAVENOUS

## 2017-05-25 MED ORDER — PHENYLEPHRINE HCL 10 MG/ML IJ SOLN
INTRAMUSCULAR | Status: DC | PRN
  Administered 2017-05-25 (×2): 100 ug via INTRAVENOUS

## 2017-05-25 MED ORDER — ONDANSETRON HCL 4 MG PO TABS: 4.0000 mg | ORAL_TABLET | Freq: Four times a day (QID) | ORAL | Status: DC | PRN

## 2017-05-25 MED ORDER — OXYCODONE HCL 5 MG PO TABS
5.0000 mg | ORAL_TABLET | ORAL | Status: DC | PRN
  Administered 2017-05-26: 5 mg via ORAL
  Filled 2017-05-25: qty 1

## 2017-05-25 MED ORDER — ALUM & MAG HYDROXIDE-SIMETH 200-200-20 MG/5ML PO SUSP: 30.0000 mL | ORAL | Status: DC | PRN

## 2017-05-25 MED ORDER — EPHEDRINE SULFATE 50 MG/ML IJ SOLN
INTRAMUSCULAR | Status: DC | PRN
  Administered 2017-05-25 (×3): 10 mg via INTRAVENOUS

## 2017-05-25 MED ORDER — FENTANYL CITRATE (PF) 100 MCG/2ML IJ SOLN: 25.0000 ug | INTRAMUSCULAR | Status: DC | PRN

## 2017-05-25 MED ORDER — METHOCARBAMOL 1000 MG/10ML IJ SOLN
500.0000 mg | Freq: Four times a day (QID) | INTRAVENOUS | Status: DC | PRN
  Filled 2017-05-25: qty 5

## 2017-05-25 MED ORDER — ONDANSETRON HCL 4 MG/2ML IJ SOLN: 4.0000 mg | Freq: Once | INTRAMUSCULAR | Status: DC | PRN

## 2017-05-25 MED ORDER — MORPHINE SULFATE (PF) 4 MG/ML IV SOLN
INTRAVENOUS | Status: AC
  Filled 2017-05-25: qty 1

## 2017-05-25 MED ORDER — HYDRALAZINE HCL 20 MG/ML IJ SOLN
10.0000 mg | Freq: Four times a day (QID) | INTRAMUSCULAR | Status: DC | PRN
  Administered 2017-05-25 – 2017-05-28 (×4): 10 mg via INTRAVENOUS
  Filled 2017-05-25 (×4): qty 1

## 2017-05-25 MED ORDER — MUPIROCIN 2 % EX OINT
1.0000 "application " | TOPICAL_OINTMENT | Freq: Two times a day (BID) | CUTANEOUS | Status: AC
  Administered 2017-05-25 – 2017-05-29 (×10): 1 via NASAL
  Filled 2017-05-25: qty 22

## 2017-05-25 MED ORDER — SUGAMMADEX SODIUM 200 MG/2ML IV SOLN
INTRAVENOUS | Status: AC
  Filled 2017-05-25: qty 2

## 2017-05-25 MED ORDER — CLINDAMYCIN PHOSPHATE 600 MG/50ML IV SOLN
600.0000 mg | Freq: Once | INTRAVENOUS | Status: AC
  Administered 2017-05-25: 600 mg via INTRAVENOUS
  Filled 2017-05-25: qty 50

## 2017-05-25 MED ORDER — METHOCARBAMOL 500 MG PO TABS: 500.0000 mg | ORAL_TABLET | Freq: Four times a day (QID) | ORAL | Status: DC | PRN

## 2017-05-25 MED ORDER — MORPHINE SULFATE (PF) 2 MG/ML IV SOLN
2.0000 mg | INTRAVENOUS | Status: DC | PRN
  Administered 2017-05-25 (×2): 2 mg via INTRAVENOUS
  Administered 2017-05-25: 4 mg via INTRAVENOUS
  Filled 2017-05-25 (×2): qty 1

## 2017-05-25 MED ORDER — ONDANSETRON HCL 4 MG/2ML IJ SOLN: 4.0000 mg | Freq: Four times a day (QID) | INTRAMUSCULAR | Status: DC | PRN

## 2017-05-25 MED ORDER — TIMOLOL MALEATE 0.5 % OP SOLN
1.0000 [drp] | Freq: Every day | OPHTHALMIC | Status: DC
  Administered 2017-05-25 – 2017-05-30 (×6): 1 [drp] via OPHTHALMIC
  Filled 2017-05-25: qty 5

## 2017-05-25 MED ORDER — ONDANSETRON HCL 4 MG/2ML IJ SOLN
INTRAMUSCULAR | Status: AC
  Filled 2017-05-25: qty 2

## 2017-05-25 MED ORDER — DEXAMETHASONE SODIUM PHOSPHATE 10 MG/ML IJ SOLN
INTRAMUSCULAR | Status: AC
  Filled 2017-05-25: qty 1

## 2017-05-25 MED ORDER — ACETAMINOPHEN 325 MG PO TABS: 650.0000 mg | ORAL_TABLET | Freq: Four times a day (QID) | ORAL | Status: DC | PRN

## 2017-05-25 MED ORDER — FERROUS SULFATE 325 (65 FE) MG PO TABS
325.0000 mg | ORAL_TABLET | Freq: Three times a day (TID) | ORAL | Status: DC
  Administered 2017-05-26 – 2017-05-31 (×14): 325 mg via ORAL
  Filled 2017-05-25 (×14): qty 1

## 2017-05-25 MED ORDER — LIDOCAINE HCL (PF) 2 % IJ SOLN
INTRAMUSCULAR | Status: AC
  Filled 2017-05-25: qty 10

## 2017-05-25 MED ORDER — ACETAMINOPHEN 10 MG/ML IV SOLN
INTRAVENOUS | Status: AC
  Filled 2017-05-25: qty 100

## 2017-05-25 MED ORDER — TRAMADOL HCL 50 MG PO TABS
50.0000 mg | ORAL_TABLET | Freq: Four times a day (QID) | ORAL | Status: DC | PRN
  Administered 2017-05-26: 50 mg via ORAL
  Filled 2017-05-25: qty 1

## 2017-05-25 MED ORDER — SODIUM CHLORIDE 0.9 % IV SOLN
Freq: Once | INTRAVENOUS | Status: AC
  Administered 2017-05-25: 05:00:00 via INTRAVENOUS

## 2017-05-25 MED ORDER — SUCCINYLCHOLINE CHLORIDE 20 MG/ML IJ SOLN
INTRAMUSCULAR | Status: DC | PRN
  Administered 2017-05-25: 70 mg via INTRAVENOUS

## 2017-05-25 MED ORDER — MIDAZOLAM HCL 2 MG/2ML IJ SOLN
INTRAMUSCULAR | Status: AC
  Filled 2017-05-25: qty 2

## 2017-05-25 MED ORDER — LIDOCAINE HCL (CARDIAC) PF 100 MG/5ML IV SOSY
PREFILLED_SYRINGE | INTRAVENOUS | Status: DC | PRN
  Administered 2017-05-25: 45 mg via INTRAVENOUS

## 2017-05-25 MED ORDER — PAROXETINE HCL 20 MG PO TABS
40.0000 mg | ORAL_TABLET | Freq: Every day | ORAL | Status: DC
  Administered 2017-05-25 – 2017-05-30 (×5): 40 mg via ORAL
  Filled 2017-05-25 (×6): qty 2

## 2017-05-25 MED ORDER — SUCCINYLCHOLINE CHLORIDE 20 MG/ML IJ SOLN
INTRAMUSCULAR | Status: AC
  Filled 2017-05-25: qty 1

## 2017-05-25 MED ORDER — ROCURONIUM BROMIDE 50 MG/5ML IV SOLN
INTRAVENOUS | Status: AC
  Filled 2017-05-25: qty 1

## 2017-05-25 MED ORDER — DOCUSATE SODIUM 100 MG PO CAPS: 100.0000 mg | ORAL_CAPSULE | Freq: Two times a day (BID) | ORAL | Status: DC

## 2017-05-25 MED ORDER — CHLORHEXIDINE GLUCONATE CLOTH 2 % EX PADS
6.0000 | MEDICATED_PAD | Freq: Every day | CUTANEOUS | Status: DC
  Administered 2017-05-25: 6 via TOPICAL

## 2017-05-25 MED ORDER — FENTANYL CITRATE (PF) 100 MCG/2ML IJ SOLN
INTRAMUSCULAR | Status: DC | PRN
  Administered 2017-05-25: 25 ug via INTRAVENOUS
  Administered 2017-05-25: 50 ug via INTRAVENOUS

## 2017-05-25 MED ORDER — BISACODYL 10 MG RE SUPP
10.0000 mg | Freq: Every day | RECTAL | Status: DC | PRN
  Administered 2017-05-28: 10 mg via RECTAL
  Filled 2017-05-25: qty 1

## 2017-05-25 MED ORDER — ROCURONIUM BROMIDE 100 MG/10ML IV SOLN
INTRAVENOUS | Status: DC | PRN
  Administered 2017-05-25: 20 mg via INTRAVENOUS

## 2017-05-25 MED ORDER — ONDANSETRON HCL 4 MG/2ML IJ SOLN
4.0000 mg | Freq: Four times a day (QID) | INTRAMUSCULAR | Status: DC | PRN
  Administered 2017-05-25: 4 mg via INTRAVENOUS

## 2017-05-25 MED ORDER — VITAMIN K1 10 MG/ML IJ SOLN: 10.0000 mg | Freq: Once | INTRAMUSCULAR | Status: DC

## 2017-05-25 MED ORDER — DOCUSATE SODIUM 100 MG PO CAPS
100.0000 mg | ORAL_CAPSULE | Freq: Two times a day (BID) | ORAL | Status: DC
  Administered 2017-05-25 – 2017-05-29 (×5): 100 mg via ORAL
  Filled 2017-05-25 (×8): qty 1

## 2017-05-25 MED ORDER — LABETALOL HCL 5 MG/ML IV SOLN
5.0000 mg | INTRAVENOUS | Status: DC | PRN
  Administered 2017-05-25: 5 mg via INTRAVENOUS
  Filled 2017-05-25: qty 4

## 2017-05-25 MED ORDER — TRAZODONE HCL 50 MG PO TABS
25.0000 mg | ORAL_TABLET | Freq: Every day | ORAL | Status: DC
  Administered 2017-05-25: 25 mg via ORAL
  Filled 2017-05-25: qty 1

## 2017-05-25 MED ORDER — SODIUM CHLORIDE 0.9 % IV SOLN
INTRAVENOUS | Status: DC
  Administered 2017-05-25: 03:00:00 via INTRAVENOUS

## 2017-05-25 MED ORDER — PROPOFOL 10 MG/ML IV BOLUS
INTRAVENOUS | Status: DC | PRN
  Administered 2017-05-25: 50 mg via INTRAVENOUS

## 2017-05-25 MED ORDER — MAGNESIUM CITRATE PO SOLN
1.0000 | Freq: Once | ORAL | Status: DC | PRN
  Filled 2017-05-25: qty 296

## 2017-05-25 MED ORDER — ONDANSETRON HCL 4 MG/2ML IJ SOLN
INTRAMUSCULAR | Status: DC | PRN
  Administered 2017-05-25: 4 mg via INTRAVENOUS

## 2017-05-25 MED ORDER — ACETAMINOPHEN 650 MG RE SUPP: 650.0000 mg | Freq: Four times a day (QID) | RECTAL | Status: DC | PRN

## 2017-05-25 MED ORDER — POLYETHYLENE GLYCOL 3350 17 G PO PACK: 17.0000 g | PACK | Freq: Every day | ORAL | Status: DC | PRN

## 2017-05-25 MED ORDER — METOPROLOL TARTRATE 25 MG PO TABS
25.0000 mg | ORAL_TABLET | Freq: Two times a day (BID) | ORAL | Status: DC
  Administered 2017-05-25 – 2017-05-31 (×10): 25 mg via ORAL
  Filled 2017-05-25 (×13): qty 1

## 2017-05-25 MED ORDER — CLINDAMYCIN PHOSPHATE 600 MG/50ML IV SOLN
600.0000 mg | Freq: Four times a day (QID) | INTRAVENOUS | Status: AC
  Administered 2017-05-25 – 2017-05-26 (×2): 600 mg via INTRAVENOUS
  Filled 2017-05-25 (×2): qty 50

## 2017-05-25 MED ORDER — ACETAMINOPHEN 10 MG/ML IV SOLN
INTRAVENOUS | Status: DC | PRN
  Administered 2017-05-25: 1000 mg via INTRAVENOUS

## 2017-05-25 MED ORDER — MIDAZOLAM HCL 2 MG/2ML IJ SOLN
INTRAMUSCULAR | Status: DC | PRN
  Administered 2017-05-25: 1 mg via INTRAVENOUS

## 2017-05-25 MED ORDER — SUGAMMADEX SODIUM 200 MG/2ML IV SOLN
INTRAVENOUS | Status: DC | PRN
  Administered 2017-05-25: 150 mg via INTRAVENOUS

## 2017-05-25 MED ORDER — POTASSIUM CHLORIDE IN NACL 20-0.9 MEQ/L-% IV SOLN
INTRAVENOUS | Status: DC
  Administered 2017-05-25: 18:00:00 via INTRAVENOUS
  Filled 2017-05-25 (×3): qty 1000

## 2017-05-25 MED ORDER — LACTATED RINGERS IV SOLN
INTRAVENOUS | Status: DC | PRN
  Administered 2017-05-25: 14:00:00 via INTRAVENOUS

## 2017-05-25 MED ORDER — ENOXAPARIN SODIUM 30 MG/0.3ML ~~LOC~~ SOLN
30.0000 mg | SUBCUTANEOUS | Status: DC
Start: 2017-05-26 — End: 2017-05-29
  Administered 2017-05-26 – 2017-05-29 (×4): 30 mg via SUBCUTANEOUS
  Filled 2017-05-25 (×4): qty 0.3

## 2017-05-25 SURGICAL SUPPLY — 38 items
BIT DRILL 4.3MMS DISTAL GRDTED (BIT) ×1 IMPLANT
BNDG COHESIVE 6X5 TAN STRL LF (GAUZE/BANDAGES/DRESSINGS) ×6 IMPLANT
CANISTER SUCT 1200ML W/VALVE (MISCELLANEOUS) ×3 IMPLANT
DRAPE SHEET LG 3/4 BI-LAMINATE (DRAPES) ×6 IMPLANT
DRAPE SURG 17X11 SM STRL (DRAPES) ×6 IMPLANT
DRAPE U-SHAPE 47X51 STRL (DRAPES) ×3 IMPLANT
DRILL 4.3MMS DISTAL GRADUATED (BIT) ×3
DRSG OPSITE POSTOP 4X12 (GAUZE/BANDAGES/DRESSINGS) ×3 IMPLANT
DRSG OPSITE POSTOP 4X14 (GAUZE/BANDAGES/DRESSINGS) IMPLANT
DURAPREP 26ML APPLICATOR (WOUND CARE) ×6 IMPLANT
ELECT REM PT RETURN 9FT ADLT (ELECTROSURGICAL) ×3
ELECTRODE REM PT RTRN 9FT ADLT (ELECTROSURGICAL) ×1 IMPLANT
GLOVE BIOGEL PI IND STRL 9 (GLOVE) ×1 IMPLANT
GLOVE BIOGEL PI INDICATOR 9 (GLOVE) ×2
GLOVE SURG 9.0 ORTHO LTXF (GLOVE) ×6 IMPLANT
GOWN STRL REUS TWL 2XL XL LVL4 (GOWN DISPOSABLE) ×3 IMPLANT
GOWN STRL REUS W/ TWL LRG LVL3 (GOWN DISPOSABLE) ×1 IMPLANT
GOWN STRL REUS W/TWL LRG LVL3 (GOWN DISPOSABLE) ×2
GUIDEPIN VERSANAIL DSP 3.2X444 (ORTHOPEDIC DISPOSABLE SUPPLIES) ×3 IMPLANT
GUIDEWIRE BALL NOSE 80CM (WIRE) ×3 IMPLANT
HEMOVAC 400CC 10FR (MISCELLANEOUS) ×3 IMPLANT
HFN LH 130 DEG 11MM X 380MM (Orthopedic Implant) ×3 IMPLANT
KIT TURNOVER CYSTO (KITS) ×3 IMPLANT
MAT BLUE FLOOR 46X72 FLO (MISCELLANEOUS) ×3 IMPLANT
NS IRRIG 1000ML POUR BTL (IV SOLUTION) ×3 IMPLANT
PACK HIP COMPR (MISCELLANEOUS) ×3 IMPLANT
SCREW BONE CORTICAL 5.0X42 (Screw) ×3 IMPLANT
SCREW BONE CORTICAL 5.0X50 (Screw) ×3 IMPLANT
SCREW LAG HIP NAIL 10.5X95 (Screw) ×3 IMPLANT
SCREWDRIVER HEX TIP 3.5MM (MISCELLANEOUS) ×3 IMPLANT
STAPLER SKIN PROX 35W (STAPLE) ×3 IMPLANT
SUCTION FRAZIER HANDLE 10FR (MISCELLANEOUS) ×2
SUCTION TUBE FRAZIER 10FR DISP (MISCELLANEOUS) ×1 IMPLANT
SUT VIC AB 0 CT1 36 (SUTURE) ×6 IMPLANT
SUT VIC AB 2-0 CT1 27 (SUTURE) ×2
SUT VIC AB 2-0 CT1 TAPERPNT 27 (SUTURE) ×1 IMPLANT
SUT VICRYL 0 AB UR-6 (SUTURE) ×3 IMPLANT
SYR 30ML LL (SYRINGE) ×3 IMPLANT

## 2017-05-25 NOTE — Anesthesia Preprocedure Evaluation (Signed)
Anesthesia Evaluation  Patient identified by MRN, date of birth, ID band Patient awake    Reviewed: Allergy & Precautions, NPO status , Patient's Chart, lab work & pertinent test results, reviewed documented beta blocker date and time   History of Anesthesia Complications (+) PONV and history of anesthetic complications  Airway Mallampati: III  TM Distance: >3 FB     Dental  (+) Chipped   Pulmonary           Cardiovascular hypertension, Pt. on medications and Pt. on home beta blockers      Neuro/Psych PSYCHIATRIC DISORDERS Anxiety Depression    GI/Hepatic   Endo/Other    Renal/GU      Musculoskeletal  (+) Arthritis ,   Abdominal   Peds  Hematology  (+) anemia ,   Anesthesia Other Findings Hx of TBI. Ct scan shows no change. Increased TSH. Hb was 6.9. She has received 2 units of blood. EKG and ECHO checked and ok. INR and PTT elevated. Will plan on General.  Reproductive/Obstetrics                             Anesthesia Physical Anesthesia Plan  ASA: III  Anesthesia Plan: General   Post-op Pain Management:    Induction: Intravenous  PONV Risk Score and Plan:   Airway Management Planned: Oral ETT  Additional Equipment:   Intra-op Plan:   Post-operative Plan:   Informed Consent: I have reviewed the patients History and Physical, chart, labs and discussed the procedure including the risks, benefits and alternatives for the proposed anesthesia with the patient or authorized representative who has indicated his/her understanding and acceptance.     Plan Discussed with: CRNA  Anesthesia Plan Comments:         Anesthesia Quick Evaluation

## 2017-05-25 NOTE — NC FL2 (Addendum)
[place] LEVEL OF CARE SCREENING TOOL     IDENTIFICATION  Patient Name: Theresa Lynch Birthdate: [birth year] Sex: female Admission Date (Current Location): 05/24/2017  [place] and [place] Number:  Engineering geologist and Address:  [facility] [address] [place], [place] [address]      Provider Number: [id number]  Attending Physician Name and Address:  [name], MD  Relative Name and Phone Number:       Current Level of Care: Hospital Recommended Level of Care: [place] Prior Approval Number:    Date Approved/Denied:   PASRR Number:    Discharge Plan: Skilled Nursing Facility     Current Diagnoses: Patient Active Problem List   Diagnosis Date Noted  . Hip fracture ([place]) 05/25/2017  . Benign paroxysmal positional vertigo 09/27/2012  . Leucocytosis 08/23/2012  . TBI (traumatic brain injury) ([place]) 08/21/2012  . Hypokalemia 08/21/2012  . Hyponatremia 08/21/2012  . Syncope 08/21/2012  . Chest pain 08/21/2012  . Fall 08/18/2012  . Acute respiratory failure ([place]) 08/18/2012  . Acute blood loss anemia 08/18/2012  . Intracerebral hemorrhage ([place]) 08/15/2012  . Subarachnoid hemorrhage ([place]) 08/15/2012  . Closed basilar skull fracture with subarachnoid hemorrhage ([place]) 08/15/2012  . Closed fracture of facial bones ([place]) 08/15/2012    Orientation RESPIRATION BLADDER Height & Weight     Self, Time, Place  O2(2 Liters Oxygen. ) Continent Weight: 132 lb (59.9 kg) Height:  5\' 6"  (167.6 cm)  BEHAVIORAL SYMPTOMS/MOOD NEUROLOGICAL BOWEL NUTRITION STATUS      Continent Diet(Diet: NPO for surgery to be advanced. )  AMBULATORY STATUS COMMUNICATION OF NEEDS Skin   Extensive Assist Verbally Surgical wounds                       Personal Care Assistance Level of Assistance  Bathing, Feeding, Dressing Bathing Assistance: Limited assistance Feeding assistance: Independent Dressing Assistance: Limited  assistance     Functional Limitations Info  Sight, Hearing, Speech Sight Info: Adequate Hearing Info: Adequate Speech Info: Adequate    SPECIAL CARE FACTORS FREQUENCY  PT (By licensed PT), OT (By licensed OT)     PT Frequency: (5) OT Frequency: (5)            Contractures      Additional Factors Info  Code Status, Allergies Code Status Info: (Full Code. ) Allergies Info: (Codeine, Penicillins)           Current Medications (05/25/2017):  This is the current hospital active medication list Current Facility-Administered Medications  Medication Dose Route Frequency Provider Last Rate Last Dose  . 0.9 %  sodium chloride infusion   Intravenous Continuous Harrie Foreman, MD 100 mL/hr at 05/25/17 0252    . acetaminophen (TYLENOL) tablet 650 mg  650 mg Oral Q6H PRN Harrie Foreman, MD       Or  . acetaminophen (TYLENOL) suppository 650 mg  650 mg Rectal Q6H PRN Harrie Foreman, MD      . Chlorhexidine Gluconate Cloth 2 % PADS 6 each  6 each Topical Daily Harrie Foreman, MD      . docusate sodium (COLACE) capsule 100 mg  100 mg Oral BID Harrie Foreman, MD      . labetalol (NORMODYNE,TRANDATE) injection 5-10 mg  5-10 mg Intravenous Q2H PRN Harrie Foreman, MD   5 mg at 05/25/17 0421  . metoprolol tartrate (LOPRESSOR) tablet 25 mg  25 mg Oral BID Rosilyn Mings  S, MD      . morphine 2 MG/ML injection 2-4 mg  2-4 mg Intravenous Q4H PRN Harrie Foreman, MD   2 mg at 05/25/17 5852  . morphine 4 MG/ML injection           . mupirocin ointment (BACTROBAN) 2 % 1 application  1 application Nasal BID Harrie Foreman, MD      . ondansetron [facility]) tablet 4 mg  4 mg Oral Q6H PRN Harrie Foreman, MD       Or  . ondansetron [facility]) injection 4 mg  4 mg Intravenous Q6H PRN Harrie Foreman, MD   4 mg at 05/25/17 0110  . PARoxetine (PAXIL) tablet 40 mg  40 mg Oral QHS Harrie Foreman, MD      . timolol (TIMOPTIC) 0.5 % ophthalmic solution 1 drop  1 drop  Both Eyes QHS Harrie Foreman, MD      . traZODone (DESYREL) tablet 25 mg  25 mg Oral QHS Harrie Foreman, MD         Discharge Medications: Please see discharge summary for a list of discharge medications.  Relevant Imaging Results:  Relevant Lab Results:   Additional Information (SSN: [id number])  Chalsea Darko, Veronia Beets, LCSW

## 2017-05-25 NOTE — Progress Notes (Signed)
Initial Nutrition Assessment  DOCUMENTATION CODES:   Not applicable  INTERVENTION:  Ensure Enlive po BID, each supplement provides 350 kcal and 20 grams of protein with diet advancement  NUTRITION DIAGNOSIS:   Inadequate oral intake related to inability to eat as evidenced by NPO status  GOAL:   Patient will meet greater than or equal to 90% of their needs  MONITOR:   Diet advancement, Labs, Weight trends  REASON FOR ASSESSMENT:   Malnutrition Screening Tool    ASSESSMENT:   The patient with past medical history of hypertension, hyperlipidemia and history of traumatic brain injury with intraparenchymal hemorrhage presents to the emergency department following a fall, L Hip Fx. She was also found with lytic lesions on brain via CT scan concerning for malignancy  RD drawn to patient for positive MST. Patient in OR during multiple visits.  Will obtain full nutrition history and complete physical exam at follow-up. Last 2 weights in chart appear stated (140 lbs.) she was 132 pounds today. Monitor diet advancement and PO intake.  Labs reviewed Medications reviewed and include:  Colace  NUTRITION - FOCUSED PHYSICAL EXAM:    Most Recent Value  Orbital Region  Unable to assess  Upper Arm Region  Unable to assess  Thoracic and Lumbar Region  Unable to assess  Buccal Region  Unable to assess  Lind Region  Unable to assess  Clavicle Bone Region  Unable to assess  Clavicle and Acromion Bone Region  Unable to assess  Scapular Bone Region  Unable to assess  Dorsal Hand  Unable to assess  Patellar Region  Unable to assess  Anterior Thigh Region  Unable to assess  Posterior Calf Region  Unable to assess       Diet Order:   Diet Order           Diet NPO time specified Except for: Sips with Meds  Diet effective now          EDUCATION NEEDS:   Not appropriate for education at this time  Skin:  Skin Assessment: Reviewed RN Assessment(ecchymosis to arm)  Last BM:   PTA  Height:   Ht Readings from Last 1 Encounters:  05/24/17 5\' 6"  (1.676 m)    Weight:   Wt Readings from Last 1 Encounters:  05/25/17 132 lb (59.9 kg)    Ideal Body Weight:  59.09 kg  BMI:  Body mass index is 21.31 kg/m.  Estimated Nutritional Needs:   Kcal:  1400-1600 calories (MSJ x1.3-1.5)  Protein:  78-90 grams (1.3-1.5g/kg)  Fluid:  >1.5L  Satira Anis. [place] Lebeau, MS, RD LDN Inpatient Clinical Dietitian Pager [phone]

## 2017-05-25 NOTE — Consult Note (Signed)
ORTHOPAEDIC CONSULTATION  REQUESTING PHYSICIAN: [name], MD  Chief Complaint: Left hip pain s/p fall  HPI: Theresa Lynch is a 84 y.o. female who complains of left hip pain s/p fall.  Patient has xray evidence of a right intertrochanteric hip fracture as well as minimally displaced fractures of her superior and inferior rami.  Family is at the bedside today.  Past Medical History:  Diagnosis Date  . Arthritis    hands  . Claustrophobia   . Depression    with anxiety/h/o agoraphobia  . Glaucoma   . HOH (hard of hearing)   . Hyperlipidemia   . Hypertension   . PONV (postoperative nausea and vomiting)    Past Surgical History:  Procedure Laterality Date  . CATARACT EXTRACTION W/PHACO Right 03/15/2015   Procedure: CATARACT EXTRACTION PHACO AND INTRAOCULAR LENS PLACEMENT (IOC);  Surgeon: Ronnell Freshwater, MD;  Location: [place];  Service: Ophthalmology;  Laterality: Right;  . ROTATOR CUFF REPAIR  1999   bilateral   Social History   Socioeconomic History  . Marital status: Married    Spouse name: Not on file  . Number of children: Not on file  . Years of education: Not on file  . Highest education level: Not on file  Occupational History  . Not on file  Social Needs  . Financial resource strain: Not on file  . Food insecurity:    Worry: Not on file    Inability: Not on file  . Transportation needs:    Medical: Not on file    Non-medical: Not on file  Tobacco Use  . Smoking status: Never Smoker  . Smokeless tobacco: Never Used  Substance and Sexual Activity  . Alcohol use: Yes    Alcohol/week: 4.2 oz    Types: 7 Glasses of wine per week  . Drug use: No  . Sexual activity: Not on file  Lifestyle  . Physical activity:    Days per week: Not on file    Minutes per session: Not on file  . Stress: Not on file  Relationships  . Social connections:    Talks on phone: Not on file    Gets together: Not on file    Attends religious service:  Not on file    Active member of club or organization: Not on file    Attends meetings of clubs or organizations: Not on file    Relationship status: Not on file  Other Topics Concern  . Not on file  Social History Narrative  . Not on file   History reviewed. No pertinent family history. Allergies  Allergen Reactions  . Codeine Nausea And Vomiting  . Penicillins Swelling and Other (See Comments)    Lips swell Has patient had a PCN reaction causing immediate rash, facial/tongue/throat swelling, SOB or lightheadedness with hypotension: Yes Has patient had a PCN reaction causing severe rash involving mucus membranes or skin necrosis: No Has patient had a PCN reaction that required hospitalization: No Has patient had a PCN reaction occurring within the last 10 years: No If all of the above answers are "NO", then may proceed with Cephalosporin use.    Prior to Admission medications   Medication Sig Start Date End Date Taking? Authorizing Provider  atorvastatin (LIPITOR) 10 MG tablet Take 5 mg by mouth daily.    Yes [provider]  metoprolol tartrate (LOPRESSOR) 25 MG tablet Take 1 tablet (25 mg total) by mouth 2 (two) times daily. 09/05/12  Yes Love, Olin Hauser  S, PA-C  PARoxetine (PAXIL) 40 MG tablet Take 40 mg by mouth at bedtime. 09/05/12  Yes Love, Ivan Anchors, PA-C  traZODone (DESYREL) 50 MG tablet Take 25 mg by mouth at bedtime.    Yes [provider]  vitamin B-12 (CYANOCOBALAMIN) 1000 MCG tablet Take 1,000 mcg by mouth daily.   Yes [provider]   Dg Chest 1 View  Result Date: 05/24/2017 CLINICAL DATA:  Dizzy, hip fracture EXAM: CHEST  1 VIEW COMPARISON:  Report 02/29/2016 FINDINGS: Hyperinflation. Borderline to mild cardiomegaly. No acute opacity or effusion. No pneumothorax. Postsurgical or posttraumatic deformity of the distal left clavicle. Prominent paratracheal opacity. IMPRESSION: 1. No radiographic evidence for acute cardiopulmonary abnormality 2.  Borderline to mild cardiomegaly 3. Paratracheal opacities, possible thyroid enlargement. Electronically Signed   By: Donavan Foil M.D.   On: 05/24/2017 22:48   Ct Head Wo Contrast  Result Date: 05/24/2017 CLINICAL DATA:  Dizziness EXAM: CT HEAD WITHOUT CONTRAST TECHNIQUE: Contiguous axial images were obtained from the base of the skull through the vertex without intravenous contrast. COMPARISON:  MRI brain dated 02/07/2006 FINDINGS: Brain: No evidence of acute infarction, hemorrhage, hydrocephalus, extra-axial collection or mass lesion/mass effect. 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 Ex vacuo dilatation of the occipital horn of the left lateral ventricle. Global cortical atrophy. Subcortical white matter and periventricular small vessel ischemic changes. Vascular: No hyperdense vessel or unexpected calcification. Skull: Innumerable lytic lesions throughout the calvarium. Sinuses/Orbits: Partial opacification of the left frontal sinus. Mastoid air cells are clear. Other: None. IMPRESSION: Innumerable lytic lesions throughout the calvarium. Correlate for multiple myeloma, lytic metastases, or hyperparathyroidism. No evidence of acute intracranial abnormality. 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 Atrophy with small vessel ischemic changes. Electronically Signed   By: Julian Hy M.D.   On: 05/24/2017 23:00   Dg Hip Unilat W Or Wo Pelvis 2-3 Views Left  Result Date: 05/24/2017 CLINICAL DATA:  Fall with left hip pain EXAM: DG HIP (WITH OR WITHOUT PELVIS) 2-3V LEFT COMPARISON:  None. FINDINGS: Osteopenia. SI joints non widened. Pubic symphysis is intact. Both femoral heads project in joint. Acute intertrochanteric fracture of the proximal left femur with minimal posterior angulation. Additional acute minimally displaced fractures involving the left superior and inferior pubic rami. IMPRESSION: 1. Acute left intertrochanteric fracture of the femur 2.  Suspected acute fractures of the left superior and inferior pubic rami. Electronically Signed   By: Donavan Foil M.D.   On: 05/24/2017 22:46   US Abdomen Limited Ruq  Result Date: 05/25/2017 CLINICAL DATA:  Elevated INR. EXAM: ULTRASOUND ABDOMEN LIMITED RIGHT UPPER QUADRANT COMPARISON:  None. FINDINGS: Gallbladder: No gallstones or wall thickening visualized. No sonographic Murphy sign noted by sonographer. Common bile duct: Diameter: 3.5 mm Liver: No focal lesion identified. Within normal limits in parenchymal echogenicity. Portal vein is patent on color Doppler imaging with normal direction of blood flow towards the liver. Additional finding: Small right pleural effusion. IMPRESSION: Small right pleural effusion.  Otherwise, normal examination. Electronically Signed   By: Claudie Revering M.D.   On: 05/25/2017 12:06    Positive ROS: All other systems have been reviewed and were otherwise negative with the exception of those mentioned in the HPI and as above.  Physical Exam: General: Alert, no acute distress  MUSCULOSKELETAL: Left lower extremity: Patient skin is intact.  She has shortening and external rotation of the left lower extremity.  She has palpable pedal pulses.    Assessment: Left displaced intertrochanteric  hip fracture  Plan: I spent to the family the nature of the injury.  Recommending intramedullary fixation for this fracture.  Plan the details of this operation as well as the postoperative course.  We also discussed the risks and benefits of surgery.  They understand the risks include infection, bleeding requiring blood transfusion beyond the 2 units she has already received, nerve or blood vessel injury, leg length discrepancy, change in lower extremity rotation, persistent left hip pain, screw cut out, hardware failure and the need for further surgery.  They understand medical risks include but are not limited to DVT and pulmonary embolism, myocardial infarction, stroke, pneumonia,  respiratory failure and death.  They understand these risks and wished to proceed with surgery.  Spoke with Dr. Wray Kearns from the hospitalist service.  The patient is cleared for surgery.  I reviewed her labs and radiographic studies in preparation for this case.  Given her elevated INR she is not a candidate for spinal anesthesia today.  She is not on baseline blood thinners.  Patient had lytic lesions seen on her head CT.  She will follow-up with her primary care physician for work-up of possible multiple myeloma.    Thornton Park, MD    05/25/2017 12:44 PM

## 2017-05-25 NOTE — Anesthesia Procedure Notes (Signed)
Procedure Name: Intubation Date/Time: 05/25/2017 1:42 PM Performed by: Johnna Acosta, CRNA Pre-anesthesia Checklist: Patient identified, Patient being monitored, Timeout performed, Emergency Drugs available and Suction available Patient Re-evaluated:Patient Re-evaluated prior to induction Oxygen Delivery Method: Circle system utilized Preoxygenation: Pre-oxygenation with 100% oxygen Induction Type: IV induction Ventilation: Mask ventilation without difficulty Laryngoscope Size: Mac and 3 Grade View: Grade I Tube type: Oral Tube size: 7.0 mm Number of attempts: 1 Airway Equipment and Method: Stylet Placement Confirmation: ETT inserted through vocal cords under direct vision,  positive ETCO2 and breath sounds checked- equal and bilateral Secured at: 21 cm Tube secured with: Tape Dental Injury: Teeth and Oropharynx as per pre-operative assessment

## 2017-05-25 NOTE — Progress Notes (Signed)
[place] for enoxaparin Indication: VTE prophylaxis  Allergies  Allergen Reactions  . Codeine Nausea And Vomiting  . Penicillins Swelling and Other (See Comments)    Lips swell Has patient had a PCN reaction causing immediate rash, facial/tongue/throat swelling, SOB or lightheadedness with hypotension: Yes Has patient had a PCN reaction causing severe rash involving mucus membranes or skin necrosis: No Has patient had a PCN reaction that required hospitalization: No Has patient had a PCN reaction occurring within the last 10 years: No If all of the above answers are "NO", then may proceed with Cephalosporin use.     Patient Measurements: Height: 5\' 6"  (167.6 cm) Weight: 132 lb (59.9 kg) IBW/kg (Calculated) : 59.3   Vital Signs: Temp: 98.4 F (36.9 C) (05/03 1722) Temp Source: Oral (05/03 1722) BP: 157/72 (05/03 1731) Pulse Rate: 75 (05/03 1722)  Labs: Recent Labs    05/24/17 2214 05/24/17 2255 05/25/17 1238 05/25/17 1304  HGB 6.9*  --  9.7* 10.2*  HCT 20.5*  --  28.6* 30.0*  PLT 277  --   --   --   APTT  --  39* 37*  --   LABPROT  --  20.1* 21.2*  --   INR  --  1.73 1.85  --   CREATININE 1.14*  --   --   --     Estimated Creatinine Clearance: 33.8 mL/min (A) (by C-G formula based on SCr of 1.14 mg/dL (H)).   Medical History: Past Medical History:  Diagnosis Date  . Arthritis    hands  . Claustrophobia   . Depression    with anxiety/h/o agoraphobia  . Glaucoma   . HOH (hard of hearing)   . Hyperlipidemia   . Hypertension   . PONV (postoperative nausea and vomiting)     Medications:  No PTA anticoag   Assessment: 84 yo female presents with hip fracture. Pharmacy has been consulted for lovenox dosing for VTE prophylaxis.    Plan:  MD started lovenox 30mg  SQ Q24H to start tomorrow. Pharmacy will continue to follow.   Lendon Ka, PharmD Pharmacy Resident 05/25/2017,6:20 PM

## 2017-05-25 NOTE — H&P (Signed)
Theresa Lynch is an 84 y.o. female.   Chief Complaint: Fall HPI: The patient with past medical history of hypertension, hyperlipidemia and history of traumatic brain injury with intraparenchymal hemorrhage presents the emergency department following a fall.  The patient has broken her left hip and pelvis per x-ray imaging.  Emergency department staff also performed a CT of her head to rule out intracranial pathology and discovered lytic lesions throughout her skull.  The patient does not carry a diagnosis of possible myeloma.  Orthopedic surgery staff has been contacted and the hospital service was called for medical management and further evaluation.  Past Medical History:  Diagnosis Date  . Arthritis    hands  . Claustrophobia   . Depression    with anxiety/h/o agoraphobia  . Glaucoma   . HOH (hard of hearing)   . Hyperlipidemia   . Hypertension   . PONV (postoperative nausea and vomiting)     Past Surgical History:  Procedure Laterality Date  . CATARACT EXTRACTION W/PHACO Right 03/15/2015   Procedure: CATARACT EXTRACTION PHACO AND INTRAOCULAR LENS PLACEMENT (IOC);  Surgeon: Ronnell Freshwater, MD;  Location: [place];  Service: Ophthalmology;  Laterality: Right;  . ROTATOR CUFF REPAIR  1999   bilateral    No family history on file. Social History:  reports that she has never smoked. She has never used smokeless tobacco. She reports that she drinks about 4.2 oz of alcohol per week. She reports that she does not use drugs.  Allergies:  Allergies  Allergen Reactions  . Codeine Nausea And Vomiting  . Penicillins Swelling and Other (See Comments)    Lips swell Has patient had a PCN reaction causing immediate rash, facial/tongue/throat swelling, SOB or lightheadedness with hypotension: Yes Has patient had a PCN reaction causing severe rash involving mucus membranes or skin necrosis: No Has patient had a PCN reaction that required hospitalization: No Has  patient had a PCN reaction occurring within the last 10 years: No If all of the above answers are "NO", then may proceed with Cephalosporin use.     Medications Prior to Admission  Medication Sig Dispense Refill  . atorvastatin (LIPITOR) 10 MG tablet Take 5 mg by mouth daily.     . metoprolol tartrate (LOPRESSOR) 25 MG tablet Take 1 tablet (25 mg total) by mouth 2 (two) times daily. 60 tablet 1  . PARoxetine (PAXIL) 40 MG tablet Take 40 mg by mouth at bedtime.    . traZODone (DESYREL) 50 MG tablet Take 25 mg by mouth at bedtime.     . vitamin B-12 (CYANOCOBALAMIN) 1000 MCG tablet Take 1,000 mcg by mouth daily.      Results for orders placed or performed during the hospital encounter of 05/24/17 (from the past 48 hour(s))  CBC with Differential     Status: Abnormal   Collection Time: 05/24/17 10:14 PM  Result Value Ref Range   WBC 5.4 3.6 - 11.0 K/uL   RBC 2.08 (L) 3.80 - 5.20 MIL/uL   Hemoglobin 6.9 (L) 12.0 - 16.0 g/dL   HCT 20.5 (L) 35.0 - 47.0 %   MCV 98.6 80.0 - 100.0 fL   MCH 33.4 26.0 - 34.0 pg   MCHC 33.8 32.0 - 36.0 g/dL   RDW 19.6 (H) 11.5 - 14.5 %   Platelets 277 150 - 440 K/uL   Neutrophils Relative % 67 %   Neutro Abs 3.7 1.4 - 6.5 K/uL   Lymphocytes Relative 21 %   Lymphs Abs 1.1  1.0 - 3.6 K/uL   Monocytes Relative 10 %   Monocytes Absolute 0.5 0.2 - 0.9 K/uL   Eosinophils Relative 1 %   Eosinophils Absolute 0.0 0 - 0.7 K/uL   Basophils Relative 1 %   Basophils Absolute 0.0 0 - 0.1 K/uL    Comment: Performed at [facility], [address] [place], [place] [address]  Basic metabolic panel     Status: Abnormal   Collection Time: 05/24/17 10:14 PM  Result Value Ref Range   Sodium 135 135 - 145 mmol/L   Potassium 4.3 3.5 - 5.1 mmol/L    Comment: HEMOLYSIS AT THIS LEVEL MAY AFFECT RESULT   Chloride 100 (L) 101 - 111 mmol/L   CO2 24 22 - 32 mmol/L   Glucose, Bld 105 (H) 65 - 99 mg/dL   BUN 18 6 - 20 mg/dL   Creatinine, Ser 1.14 (H) 0.44 - 1.00 mg/dL    Calcium 8.5 (L) 8.9 - 10.3 mg/dL   GFR calc non Af Amer 43 (L) >60 mL/min   GFR calc Af Amer 49 (L) >60 mL/min    Comment: (NOTE) The eGFR has been calculated using the CKD EPI equation. This calculation has not been validated in all clinical situations. eGFR's persistently <60 mL/min signify possible Chronic Kidney Disease.    Anion gap 11 5 - 15    Comment: Performed at [facility], [address] [place], [place] [address]  Type and screen [place]     Status: None (Preliminary result)   Collection Time: 05/24/17 10:16 PM  Result Value Ref Range   ABO/RH(D) PENDING    Antibody Screen PENDING    Sample Expiration      05/27/2017 Performed at [facility], [address] [place], [place] [address]   Type and screen Ordered by PROVIDER DEFAULT     Status: None (Preliminary result)   Collection Time: 05/24/17 10:55 PM  Result Value Ref Range   ABO/RH(D) A POS    Antibody Screen NEG    Sample Expiration      05/27/2017 Performed at [facility], [address] [place], [place] [address]    Unit Number [id number]    Blood Component Type RED CELLS,LR    Unit division 00    Status of Unit ALLOCATED    Transfusion Status PENDING    Crossmatch Result PENDING    Unit Number [id number]    Blood Component Type RED CELLS,LR    Unit division 00    Status of Unit ALLOCATED    Transfusion Status PENDING    Crossmatch Result PENDING   Protime-INR     Status: Abnormal   Collection Time: 05/24/17 10:55 PM  Result Value Ref Range   Prothrombin Time 20.1 (H) 11.4 - 15.2 seconds   INR 1.73     Comment: Performed at [facility], [address] [place], [place] [address]  APTT     Status: Abnormal   Collection Time: 05/24/17 10:55 PM  Result Value Ref Range   aPTT 39 (H) 24 - 36 seconds    Comment:        IF BASELINE aPTT IS ELEVATED, SUGGEST PATIENT RISK ASSESSMENT BE USED TO DETERMINE  APPROPRIATE ANTICOAGULANT THERAPY. Performed at [facility], [address] [place],  [address]   Prepare RBC     Status: None (Preliminary result)   Collection Time: 05/25/17  2:30 AM  Result Value Ref Range   Order  Confirmation      ORDER PROCESSED BY BLOOD BANK Performed at [facility], [address] [place], [place] [address]    Dg Chest 1 View  Result Date: 05/24/2017 CLINICAL DATA:  Dizzy, hip fracture EXAM: CHEST  1 VIEW COMPARISON:  Report 02/29/2016 FINDINGS: Hyperinflation. Borderline to mild cardiomegaly. No acute opacity or effusion. No pneumothorax. Postsurgical or posttraumatic deformity of the distal left clavicle. Prominent paratracheal opacity. IMPRESSION: 1. No radiographic evidence for acute cardiopulmonary abnormality 2. Borderline to mild cardiomegaly 3. Paratracheal opacities, possible thyroid enlargement. Electronically Signed   By: Donavan Foil M.D.   On: 05/24/2017 22:48   Ct Head Wo Contrast  Result Date: 05/24/2017 CLINICAL DATA:  Dizziness EXAM: CT HEAD WITHOUT CONTRAST TECHNIQUE: Contiguous axial images were obtained from the base of the skull through the vertex without intravenous contrast. COMPARISON:  MRI brain dated 02/07/2006 FINDINGS: Brain: No evidence of acute infarction, hemorrhage, hydrocephalus, extra-axial collection or mass lesion/mass effect. 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 Ex vacuo dilatation of the occipital horn of the left lateral ventricle. Global cortical atrophy. Subcortical white matter and periventricular small vessel ischemic changes. Vascular: No hyperdense vessel or unexpected calcification. Skull: Innumerable lytic lesions throughout the calvarium. Sinuses/Orbits: Partial opacification of the left frontal sinus. Mastoid air cells are clear. Other: None. IMPRESSION: Innumerable lytic lesions throughout the calvarium. Correlate for multiple myeloma, lytic metastases, or  hyperparathyroidism. No evidence of acute intracranial abnormality. 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 Atrophy with small vessel ischemic changes. Electronically Signed   By: Julian Hy M.D.   On: 05/24/2017 23:00   Dg Hip Unilat W Or Wo Pelvis 2-3 Views Left  Result Date: 05/24/2017 CLINICAL DATA:  Fall with left hip pain EXAM: DG HIP (WITH OR WITHOUT PELVIS) 2-3V LEFT COMPARISON:  None. FINDINGS: Osteopenia. SI joints non widened. Pubic symphysis is intact. Both femoral heads project in joint. Acute intertrochanteric fracture of the proximal left femur with minimal posterior angulation. Additional acute minimally displaced fractures involving the left superior and inferior pubic rami. IMPRESSION: 1. Acute left intertrochanteric fracture of the femur 2. Suspected acute fractures of the left superior and inferior pubic rami. Electronically Signed   By: Donavan Foil M.D.   On: 05/24/2017 22:46    Review of Systems  Constitutional: Negative for chills and fever.  HENT: Negative for sore throat and tinnitus.   Eyes: Negative for blurred vision and redness.  Respiratory: Negative for cough and shortness of breath.   Cardiovascular: Negative for chest pain, palpitations, orthopnea and PND.  Gastrointestinal: Negative for abdominal pain, diarrhea, nausea and vomiting.  Genitourinary: Negative for dysuria, frequency and urgency.  Musculoskeletal: Positive for joint pain. Negative for myalgias.  Skin: Negative for rash.       No lesions  Neurological: Negative for speech change, focal weakness and weakness.  Endo/Heme/Allergies: Does not bruise/bleed easily.       No temperature intolerance  Psychiatric/Behavioral: Negative for depression and suicidal ideas.    Blood pressure (!) 186/89, pulse 70, temperature (!) 97.5 F (36.4 C), temperature source Oral, resp. rate 16, height '5\' 6"'  (1.676 m), weight 59.9 kg (132 lb), SpO2 100 %. Physical Exam  Vitals  reviewed. Constitutional: She is oriented to person, place, and time. She appears well-developed and well-nourished. No distress.  HENT:  Head: Normocephalic and atraumatic.  Mouth/Throat: Oropharynx is clear and moist.  Eyes: Pupils are equal, round, and reactive to light. Conjunctivae and EOM are normal. No scleral  icterus.  Neck: Normal range of motion. Neck supple. No JVD present. No tracheal deviation present. No thyromegaly present.  Cardiovascular: Normal rate, regular rhythm and normal heart sounds. Exam reveals no gallop and no friction rub.  No murmur heard. Respiratory: Effort normal and breath sounds normal.  GI: Soft. Bowel sounds are normal. She exhibits no distension. There is no tenderness.  Genitourinary:  Genitourinary Comments: Deferred  Musculoskeletal: Normal range of motion. She exhibits no edema.  Lymphadenopathy:    She has no cervical adenopathy.  Neurological: She is alert and oriented to person, place, and time. No cranial nerve deficit. She exhibits normal muscle tone.  Skin: Skin is warm and dry. No rash noted. No erythema.  Psychiatric: She has a normal mood and affect. Her behavior is normal. Judgment and thought content normal.     Assessment/Plan This is a an 84 year old female admitted for hip fracture. 1.  Hip fracture: Left intertrochanteric fracture with associated superior and inferior pelvic fractures.  Manage pain.  Orthopedic surgery consulted. 2.  Lytic lesions: Concerning for malignancy.  Check calcium studies, vitamin D and SPEP. 3.  Anemia: Transfused 2 units prior to surgery.  Obtain iron studies first. 4.  CKD: Stage III; avoid nephrotoxic agents. 5.  Hypertension: Continue metoprolol prior to surgery.  Labetalol as needed. 6.  DVT prophylaxis: SCDs 7.  GI prophylaxis: None The patient is a full code.  Time spent on initial exam patient care approximately 45 minutes  Harrie Foreman, MD 05/25/2017, 2:58 AM

## 2017-05-25 NOTE — Progress Notes (Signed)
Chaplain met with the patient's daughter Max Sane, as the patient was in pain and receiving care. The daughter recommended attempting to complete the AD on 05/26/2017 after 2 pm.  Patient will undergo surgery later today. Chaplain provided education on HCPOA. A referral to the on-call chaplain has been made.

## 2017-05-25 NOTE — Anesthesia Post-op Follow-up Note (Signed)
Anesthesia QCDR form completed.        

## 2017-05-25 NOTE — Progress Notes (Signed)
MD Marcille Blanco spoke to pts daughter Joscelyne over the phone and informed her of risks and benefits of blood transfusion. She verbalized understanding, and no further questions. Consent obtained from patient.

## 2017-05-25 NOTE — Progress Notes (Signed)
Family Meeting Note  Advance Directive yes Today a meeting took place with the daughters in the room  pt is unable to participate due IO:EVOJJK capacity , confusion due to meds(pain)  The following were discussed:Patient's diagnosis: patient is being admitted with hip fracture. She was found to have multiple lytic lesions in the skull. She has anemia and chronic kidney disease. Discussed at length with patient's daughters in the room., Patient's progosis: stable at present. Patient has chronic medical conditions. Discussed code status. Daughter Theresa Lynch confirmed patient is DNR.   Time spent during discussion:16 mins  [name], MD

## 2017-05-25 NOTE — Op Note (Signed)
05/25/2017  3:19 PM  PATIENT:  Theresa Lynch    PRE-OPERATIVE DIAGNOSIS: Displaced left intertrochanteric hip fracture  POST-OPERATIVE DIAGNOSIS:  Same  PROCEDURE:  INTRAMEDULLARY (IM) FIXATION OF LEFT INTERTROCHANTRIC HIP FRACTURE  SURGEON:  Thornton Park, MD  ANESTHESIA:   General  PREOPERATIVE INDICATIONS:  Theresa Lynch is a  84 y.o. female with a diagnosis of displaced left intertrochanteric hip fracture.  I recommended intramedullary fixation for this fracture.  The risks, benefits and alternatives were discussed with the patient and their family.  The risks include but are not limited to infection, bleeding, nerve or blood vessel injury, malunion, nonunion, hardware prominence, hardware failure, change in leg lengths or lower extremity rotation need for further surgery including hardware removal with conversion to a total hip arthroplasty. Medical risks include but are not limited to DVT and pulmonary embolism, myocardial infarction, stroke, pneumonia, respiratory failure and death. The patient and their family understood these risks and wished to proceed with surgery.  OPERATIVE PROCEDURE:  The patient was brought to the operating room and placed in the supine position on the fracture table. General anesthesia was administered.  A closed reduction was performed under C-arm guidance.  The fracture reduction was confirmed on both AP and lateral views. A time out was performed to verify the patient's name, date of birth, medical record number, correct site of surgery correct procedure to be performed. The timeout was also used to verify the patient received antibiotics and all appropriate instruments, implants and radiographic studies were available in the room. Once all in attendance were in agreement, the case began. The patient was prepped and draped in a sterile fashion.  She received preoperative antibiotics with IV clindamycin.  An incision was made proximal to the  greater trochanter in line with the femur. A guidewire was placed over the tip of the greater trochanter and advanced into the proximal femur to the level of the lesser trochanter.  Confirmation of the drill pin position was made on AP and lateral C-arm images.  The threaded guidepin was then overdrilled with the proximal femoral drill.  A ball-tipped guidewire was then advanced across the fracture and down the femoral shaft to the knee. Its position at both the hip and the knee was confirmed on C-arm imaging. A depth gauge was used to measure the length of the long nail to be used. It was measured to be 380 mm. The nail was then inserted into the proximal femur, across the fracture site and down the femoral shaft. Its position was confirmed on AP and lateral C-arm images.   Once the nail was completely seated, the drill guide for the lag screw was placed through the guide arm for the Affixus nail. A guidepin was then placed through this drill guide and advanced through the lateral cortex of the femur, across the fracture site and into the femoral head achieving a tip apex distance of less than 25 mm. The length of the drill pin was measured to 95 mm, and then the drill for the lag screw was advanced through the lateral cortex, across the fracture site and up into the femoral head to the depth of the lag screw.  The lag screw was then advanced by hand into position across the fracture site into the femoral head. Its final position was confirmed on AP and lateral C-arm images. Compression was applied as traction was carefully released. The set screw in the top of the intramedullary rod was tightened by hand using  a screwdriver.  The attention was then turned to placement of the distal interlocking screws. A perfect circle technique was used. 2 small stab incisions were made over the distal interlocking screw holes. A free hand technique was used to drill both distal interlocking screws. The depth of the screws  was measured with a depth gauge. The appropriate length screw was then advanced into position and tightened by hand. Final C-arm images of the entire intramedullary construct were taken in both the AP and lateral planes.   The wounds were irrigated copiously and closed with 0 Vicryl for closure of the deep fascia and 2-0 Vicryl for subcutaneous closure. The skin was approximated with staples. A dry sterile dressing was applied. I was scrubbed and present the entire case and all sharp and instrument counts were correct at the conclusion of the case. Patient was transferred to hospital bed and brought to PACU in stable condition.  I spoke with the patient's family in the postop consultation room to let them know the case was performed without complication and the patient was stable in the recovery room.     Timoteo Gaul, MD

## 2017-05-25 NOTE — Anesthesia Postprocedure Evaluation (Signed)
Anesthesia Post Note  Patient: Theresa Lynch  Procedure(s) Performed: INTRAMEDULLARY (IM) NAIL INTERTROCHANTRIC (Left )  Patient location during evaluation: PACU Anesthesia Type: General Level of consciousness: awake and alert Pain management: pain level controlled Vital Signs Assessment: post-procedure vital signs reviewed and stable Respiratory status: spontaneous breathing, nonlabored ventilation, respiratory function stable and patient connected to nasal cannula oxygen Cardiovascular status: blood pressure returned to baseline and stable Postop Assessment: no apparent nausea or vomiting Anesthetic complications: no     Last Vitals:  Vitals:   05/25/17 1722 05/25/17 1731  BP: (!) 180/87 (!) 157/72  Pulse: 75   Resp: 20   Temp: 36.9 C   SpO2: 98%     Last Pain:  Vitals:   05/25/17 1722  TempSrc: Oral  PainSc:                  Abel Hageman S

## 2017-05-25 NOTE — Progress Notes (Signed)
Clinical Social Worker (CSW) contacted patient's daughter [name] and presented bed offers. Per [name] she will review offers and get back to CSW. [name] reported that [facility] and [facility] are locations that work for the family however they have not made an offer at this point. CSW will continue to follow and assist as needed.   [employer], LCSW [phone]

## 2017-05-25 NOTE — Clinical Social Work Note (Addendum)
Clinical Social Work Assessment  Patient Details  Name: Theresa Lynch MRN: [id number] Date of Birth: 04-24-1929  Date of referral:  05/25/17               Reason for consult:  Facility Placement                Permission sought to share information with:  Chartered certified accountant granted to share information::  Yes, Verbal Permission Granted  Name::      [place]::   [place]   Relationship::     Contact Information:     Housing/Transportation Living arrangements for the past 2 months:  [place] of Information:  Adult Children Patient Interpreter Needed:  None Criminal Activity/Legal Involvement Pertinent to Current Situation/Hospitalization:  No - Comment as needed Significant Relationships:  Adult Children Lives with:  Adult Children Do you feel safe going back to the place where you live?  Yes Need for family participation in patient care:  Yes (Comment)  Care giving concerns:  Patient lives in [place] with her son Theresa Lynch and daughter in Sports [place].    Social Worker assessment / plan:  Holiday representative ([place]) reviewed chart and noted that patient has a hip fracture. Per nurse patient will have surgery today. CSW met with patient and her daughter Theresa Lynch [phone] was at bedside. Patient was getting a blood transfusion and did not participate in assessment. Per daughter patient lives in [place] with her son Theresa Lynch and his wife. Daughter reported that patient has 3 adult children, herself and Theresa Lynch and their middle brother who has passed away. Per Theresa Lynch her middle brother was patient's HPOA so she requested a new HPOA be made. Theresa Lynch reported that she is retired from [employer] and is familiar with the medical field. CSW explained that after surgery PT will evaluate patient and make a recommendation for home health or SNF. CSW explained that patient's insurance Health Team will have to approve SNF. Theresa Lynch verbalize her  understanding and reported that she wants patient to go to SNF in [facility]. Per Theresa Lynch patient has been to inpatient rehab at [facility] 5 years ago but has never been to a SNF. FL2 complete and faxed out. Heatlh Team case manager is aware of above. CSW will continue to follow and assist as needed.   PASARR has been received, [id number] E expires 06/24/17.     Employment status:  Disabled (Comment on whether or not currently receiving Disability), Retired Nurse, adult PT Recommendations:  Not assessed at this time Information / Referral to community resources:  [place]  Patient/Family's Response to care:  Patient's daughter requested SNF placement.   Patient/Family's Understanding of and Emotional Response to Diagnosis, Current Treatment, and Prognosis:  Patient's daughter was very pleasant and thanked CSW for assistance.   Emotional Assessment Appearance:  Appears stated age Attitude/Demeanor/Rapport:  Unable to Assess Affect (typically observed):  Unable to Assess Orientation:  Oriented to Self, Oriented to Place, Oriented to  Time, Fluctuating Orientation (Suspected and/or reported Sundowners) Alcohol / Substance use:  Not Applicable Psych involvement (Current and /or in the community):  No (Comment)  Discharge Needs  Concerns to be addressed:  Discharge Planning Concerns Readmission within the last 30 days:  No Current discharge risk:  Dependent with Mobility Barriers to Discharge:  Continued Medical Work up   [employer], Veronia Beets, LCSW 05/25/2017, 11:41 AM

## 2017-05-25 NOTE — ED Notes (Signed)
Unsuccessful attempt at placing urinary catheter x2 by this RN and Seth Bake, RN. External catheter placed and hooked to suction. MD aware.

## 2017-05-25 NOTE — Care Management Note (Signed)
Case Management Note  Patient Details  Name: Theresa Lynch MRN: [id number] Date of Birth: 06-19-1929  Subjective/Objective:                   RNCM met with patient's daughter Theresa Lynch to discuss transition of care.  Patient lives with a son and his wife.  She recently lost her middle son (2 months ago).  They anticipate surgery with Dr. Mack Guise. At baseline per Theresa Lynch, patient is pretty much sedentary at home however she walks holding a cane- not using it. She hope that patient can go to rehab at [facility].  She uses [facility]. Her PCP is Dr. Netty Starring. Action/Plan: List of private duty agencies and home health agencies provided to [place] for review.  She is retired from [employer] and familiar with process.   Expected Discharge Date:  05/28/17               Expected Discharge Plan:     In-House Referral:     Discharge planning Services  CM Consult  Post Acute Care Choice:  Home Health Choice offered to:  Adult Children  DME Arranged:    DME Agency:     HH Arranged:    HH Agency:     Status of Service:  In process, will continue to follow  If discussed at Long Length of Stay Meetings, dates discussed:    Additional Comments:  Theresa Garfinkel, RN 05/25/2017, 9:46 AM

## 2017-05-25 NOTE — Progress Notes (Signed)
Pt back from surgery. BP found to be 180/87. PRN hydralazine IV given. Recheck BP 157/72.

## 2017-05-25 NOTE — Transfer of Care (Signed)
Immediate Anesthesia Transfer of Care Note  Patient: Theresa Lynch  Procedure(s) Performed: INTRAMEDULLARY (IM) NAIL INTERTROCHANTRIC (Left )  Patient Location: PACU  Anesthesia Type:General  Level of Consciousness: sedated  Airway & Oxygen Therapy: Patient Spontanous Breathing and Patient connected to face mask oxygen  Post-op Assessment: Report given to RN and Post -op Vital signs reviewed and stable  Post vital signs: Reviewed and stable  Last Vitals:  Vitals Value Taken Time  BP 151/67 05/25/2017  3:21 PM  Temp 37.1 C 05/25/2017  3:21 PM  Pulse 67 05/25/2017  3:22 PM  Resp 12 05/25/2017  3:22 PM  SpO2 100 % 05/25/2017  3:22 PM  Vitals shown include unvalidated device data.  Last Pain:  Vitals:   05/25/17 1521  TempSrc: Temporal  PainSc:          Complications: No apparent anesthesia complications

## 2017-05-25 NOTE — Progress Notes (Signed)
[facility] [place] NAME: Theresa Lynch    MR#:  [id number]  DATE OF BIRTH:  [birth year]  SUBJECTIVE:   Patient came in after having a mechanical fall at home. She sustained having a hip fracture. Patient is a little bit confused given pain medication. Family with three daughters in the room. The history is obtained from daughters. REVIEW OF SYSTEMS:   Review of Systems  Unable to perform ROS: Acuity of condition   Tolerating Diet:npo Tolerating PT: pending  DRUG ALLERGIES:   Allergies  Allergen Reactions  . Codeine Nausea And Vomiting  . Penicillins Swelling and Other (See Comments)    Lips swell Has patient had a PCN reaction causing immediate rash, facial/tongue/throat swelling, SOB or lightheadedness with hypotension: Yes Has patient had a PCN reaction causing severe rash involving mucus membranes or skin necrosis: No Has patient had a PCN reaction that required hospitalization: No Has patient had a PCN reaction occurring within the last 10 years: No If all of the above answers are "NO", then may proceed with Cephalosporin use.     VITALS:  Blood pressure (!) 185/97, pulse 80, temperature 97.6 F (36.4 C), temperature source Tympanic, resp. rate 20, height _0  (1.676 m), weight 59.9 kg (132 lb), SpO2 99 %.  PHYSICAL EXAMINATION:   Physical Exam  GENERAL:  84 y.o.-year-old patient lying in the bed with no acute distress.  EYES: Pupils equal, round, reactive to light and accommodation. No scleral icterus. Extraocular muscles intact.  HEENT: Head atraumatic, normocephalic. Oropharynx and nasopharynx clear.  NECK:  Supple, no jugular venous distention. No thyroid enlargement, no tenderness.  LUNGS: Normal breath sounds bilaterally, no wheezing, rales, rhonchi. No use of accessory muscles of respiration.  CARDIOVASCULAR: S1, S2 normal. No murmurs, rubs, or gallops.  ABDOMEN: Soft, nontender, nondistended. Bowel sounds  present. No organomegaly or mass.  EXTREMITIES: No cyanosis, clubbing or edema b/l.    NEUROLOGIC: patient a bit confused. She is moving all her extremities except her left hip  PSYCHIATRIC:  patient is alert  But pleasantly confused. SKIN: No obvious rash, lesion, or ulcer.   LABORATORY PANEL:  CBC Recent Labs  Lab 05/24/17 2214  05/25/17 1304  WBC 5.4  --   --   HGB 6.9*   < > 10.2*  HCT 20.5*   < > 30.0*  PLT 277  --   --    < > = values in this interval not displayed.    Chemistries  Recent Labs  Lab 05/24/17 2214 05/25/17 1304  NA 135 141  K 4.3 4.3  CL 100*  --   CO2 24  --   GLUCOSE 105* 141*  BUN 18  --   CREATININE 1.14*  --   CALCIUM 8.5*  --    Cardiac Enzymes No results for input(s): TROPONINI in the last 168 hours. RADIOLOGY:  Dg Chest 1 View  Result Date: 05/24/2017 CLINICAL DATA:  Dizzy, hip fracture EXAM: CHEST  1 VIEW COMPARISON:  Report 02/29/2016 FINDINGS: Hyperinflation. Borderline to mild cardiomegaly. No acute opacity or effusion. No pneumothorax. Postsurgical or posttraumatic deformity of the distal left clavicle. Prominent paratracheal opacity. IMPRESSION: 1. No radiographic evidence for acute cardiopulmonary abnormality 2. Borderline to mild cardiomegaly 3. Paratracheal opacities, possible thyroid enlargement. Electronically Signed   By: Donavan Foil M.D.   On: 05/24/2017 22:48   Ct Head Wo Contrast  Result Date: 05/24/2017 CLINICAL DATA:  Dizziness EXAM: CT HEAD WITHOUT CONTRAST  TECHNIQUE: Contiguous axial images were obtained from the base of the skull through the vertex without intravenous contrast. COMPARISON:  MRI brain dated 02/07/2006 FINDINGS: Brain: No evidence of acute infarction, hemorrhage, hydrocephalus, extra-axial collection or mass lesion/mass effect. 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 Ex vacuo dilatation of the occipital horn of the left lateral ventricle. Global cortical atrophy. Subcortical white  matter and periventricular small vessel ischemic changes. Vascular: No hyperdense vessel or unexpected calcification. Skull: Innumerable lytic lesions throughout the calvarium. Sinuses/Orbits: Partial opacification of the left frontal sinus. Mastoid air cells are clear. Other: None. IMPRESSION: Innumerable lytic lesions throughout the calvarium. Correlate for multiple myeloma, lytic metastases, or hyperparathyroidism. No evidence of acute intracranial abnormality. 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 Atrophy with small vessel ischemic changes. Electronically Signed   By: Julian Hy M.D.   On: 05/24/2017 23:00   Dg Hip Unilat W Or Wo Pelvis 2-3 Views Left  Result Date: 05/24/2017 CLINICAL DATA:  Fall with left hip pain EXAM: DG HIP (WITH OR WITHOUT PELVIS) 2-3V LEFT COMPARISON:  None. FINDINGS: Osteopenia. SI joints non widened. Pubic symphysis is intact. Both femoral heads project in joint. Acute intertrochanteric fracture of the proximal left femur with minimal posterior angulation. Additional acute minimally displaced fractures involving the left superior and inferior pubic rami. IMPRESSION: 1. Acute left intertrochanteric fracture of the femur 2. Suspected acute fractures of the left superior and inferior pubic rami. Electronically Signed   By: Donavan Foil M.D.   On: 05/24/2017 22:46   US Abdomen Limited Ruq  Result Date: 05/25/2017 CLINICAL DATA:  Elevated INR. EXAM: ULTRASOUND ABDOMEN LIMITED RIGHT UPPER QUADRANT COMPARISON:  None. FINDINGS: Gallbladder: No gallstones or wall thickening visualized. No sonographic Murphy sign noted by sonographer. Common bile duct: Diameter: 3.5 mm Liver: No focal lesion identified. Within normal limits in parenchymal echogenicity. Portal vein is patent on color Doppler imaging with normal direction of blood flow towards the liver. Additional finding: Small right pleural effusion. IMPRESSION: Small right pleural effusion.  Otherwise,  normal examination. Electronically Signed   By: Claudie Revering M.D.   On: 05/25/2017 12:06   ASSESSMENT AND PLAN:  Theresa Lynch is a 84 y.o. female with a history of hypertension as well as intracranial hemorrhage not on any blood thinners at this time who is presenting after losing her balance walking with her cane and falling on her left hip.  Says that she also hit her head but denies any headache at this time.  Complains of left hip pain and says that she fell just prior to arrival.    1.  Hip fracture: Left intertrochanteric fracture with associated superior and inferior pelvic fractures. - Manage pain.   -Orthopedic surgery consulted-- patient is low to intermediate risk for surgery. Risks and complications of surgery were discussed with patient and family. Further discussion to be held by Dr. Mack Guise.  2. incidental finding of innumerable skull  Lytic lesions:  -  Check calcium studies, vitamin D and SPEP. -Discussable results with patient's family. -Cost with oncology Dr. Rogue Bussing who will see patient in consultation  3.  Anemia: Transfused 2 units prior to surgery.   -iron studies within normal limits  4.  CKD: Stage III; avoid nephrotoxic agents.  5.  Hypertension: Continue metoprolol prior to surgery.  Labetalol as needed.  6.  DVT prophylaxis: SCDs   Case discussed with Care Management/Social Worker. Management plans discussed with the patient, family and they are in agreement.  CODE STATUS: DNR  DVT Prophylaxis: SCD for now till surgery is complete  TOTAL TIME TAKING CARE OF THIS PATIENT: *30* minutes.  >50% time spent on counselling and coordination of care  POSSIBLE D/C IN *few* DAYS, DEPENDING ON CLINICAL CONDITION.  Note: This dictation was prepared with Dragon dictation along with smaller phrase technology. Any transcriptional errors that result from this process are unintentional.  [name] M.D on 05/25/2017 at 2:37 PM  Between 7am to 6pm - Pager -  [phone]  After 6pm go to [email or web address] - password [email or web address] [facility]  Office  [phone]  CC: Primary care physician; [name], MDPatient ID: Unk Lightning, female   DOB: 02-Jul-1929, 84 y.o.   MRN: [id number]

## 2017-05-25 NOTE — Clinical Social Work Placement (Signed)
   CLINICAL SOCIAL WORK PLACEMENT  NOTE  Date:  05/25/2017  Patient Details  Name: Theresa Lynch MRN: [id number] Date of Birth: 1930/01/19  Clinical Social Work is seeking post-discharge placement for this patient at the [place] level of care (*CSW will initial, date and re-position this form in  chart as items are completed):  Yes   Patient/family provided with [place] Work Department's list of facilities offering this level of care within the geographic area requested by the patient (or if unable, by the patient's family).  Yes   Patient/family informed of their freedom to choose among providers that offer the needed level of care, that participate in Medicare, Medicaid or managed care program needed by the patient, have an available bed and are willing to accept the patient.  Yes   Patient/family informed of [facility]'s ownership interest in [facility] and [facility], as well as of the fact that they are under no obligation to receive care at these facilities.  PASRR submitted to EDS on 05/25/17     PASRR number received on       Existing PASRR number confirmed on       FL2 transmitted to all facilities in geographic area requested by pt/family on 05/25/17     FL2 transmitted to all facilities within larger geographic area on       Patient informed that his/her managed care company has contracts with or will negotiate with certain facilities, including the following:            Patient/family informed of bed offers received.  Patient chooses bed at       Physician recommends and patient chooses bed at      Patient to be transferred to   on  .  Patient to be transferred to facility by       Patient family notified on   of transfer.  Name of family member notified:        PHYSICIAN       Additional Comment:    _______________________________________________ Wilferd Ritson, Veronia Beets, LCSW 05/25/2017, 11:40 AM

## 2017-05-25 NOTE — Clinical Social Work Placement (Signed)
   CLINICAL SOCIAL WORK PLACEMENT  NOTE  Date:  05/25/2017  Patient Details  Name: Theresa Lynch MRN: [id number] Date of Birth: 1929-02-20  Clinical Social Work is seeking post-discharge placement for this patient at the [place] level of care (*CSW will initial, date and re-position this form in  chart as items are completed):  Yes   Patient/family provided with [facility] Work Department's list of facilities offering this level of care within the geographic area requested by the patient (or if unable, by the patient's family).  Yes   Patient/family informed of their freedom to choose among providers that offer the needed level of care, that participate in Medicare, Medicaid or managed care program needed by the patient, have an available bed and are willing to accept the patient.  Yes   Patient/family informed of [facility]'s ownership interest in [facility] and [facility], as well as of the fact that they are under no obligation to receive care at these facilities.  PASRR submitted to EDS on 05/25/17     PASRR number received on 05/25/17     Existing PASRR number confirmed on       FL2 transmitted to all facilities in geographic area requested by pt/family on 05/25/17     FL2 transmitted to all facilities within larger geographic area on       Patient informed that his/her managed care company has contracts with or will negotiate with certain facilities, including the following:            Patient/family informed of bed offers received.  Patient chooses bed at       Physician recommends and patient chooses bed at      Patient to be transferred to   on  .  Patient to be transferred to facility by       Patient family notified on   of transfer.  Name of family member notified:        PHYSICIAN       Additional Comment:    _______________________________________________ Shanyiah Conde, Veronia Beets, LCSW 05/25/2017, 12:23 PM

## 2017-05-26 DIAGNOSIS — D649 Anemia, unspecified: Secondary | ICD-10-CM

## 2017-05-26 DIAGNOSIS — N289 Disorder of kidney and ureter, unspecified: Secondary | ICD-10-CM

## 2017-05-26 DIAGNOSIS — F0390 Unspecified dementia without behavioral disturbance: Secondary | ICD-10-CM

## 2017-05-26 DIAGNOSIS — M899 Disorder of bone, unspecified: Secondary | ICD-10-CM

## 2017-05-26 DIAGNOSIS — S72142A Displaced intertrochanteric fracture of left femur, initial encounter for closed fracture: Principal | ICD-10-CM

## 2017-05-26 DIAGNOSIS — R410 Disorientation, unspecified: Secondary | ICD-10-CM

## 2017-05-26 LAB — TYPE AND SCREEN
ABO/RH(D): A POS
Antibody Screen: NEGATIVE
Unit division: 0
Unit division: 0

## 2017-05-26 LAB — BPAM RBC
BLOOD PRODUCT EXPIRATION DATE: 201905212359
BLOOD PRODUCT EXPIRATION DATE: 201905212359
ISSUE DATE / TIME: 201905030917
ISSUE DATE / TIME: 201905030415
UNIT TYPE AND RH: 6200
UNIT TYPE AND RH: 6200

## 2017-05-26 LAB — COMPREHENSIVE METABOLIC PANEL
ALT: 26 U/L (ref 14–54)
AST: 53 U/L — ABNORMAL HIGH (ref 15–41)
Albumin: 2.4 g/dL — ABNORMAL LOW (ref 3.5–5.0)
Alkaline Phosphatase: 75 U/L (ref 38–126)
Anion gap: 11 (ref 5–15)
BUN: 31 mg/dL — ABNORMAL HIGH (ref 6–20)
CO2: 23 mmol/L (ref 22–32)
Calcium: 10.4 mg/dL — ABNORMAL HIGH (ref 8.9–10.3)
Chloride: 104 mmol/L (ref 101–111)
Creatinine, Ser: 1.24 mg/dL — ABNORMAL HIGH (ref 0.44–1.00)
GFR calc Af Amer: 45 mL/min — ABNORMAL LOW (ref >60)
GFR calc non Af Amer: 38 mL/min — ABNORMAL LOW (ref >60)
Glucose, Bld: 115 mg/dL — ABNORMAL HIGH (ref 65–99)
Potassium: 4.8 mmol/L (ref 3.5–5.1)
Sodium: 138 mmol/L (ref 135–145)
Total Bilirubin: 2.1 mg/dL — ABNORMAL HIGH (ref 0.3–1.2)
Total Protein: 8.5 g/dL — ABNORMAL HIGH (ref 6.5–8.1)

## 2017-05-26 LAB — HCV COMMENT:

## 2017-05-26 LAB — TECHNOLOGIST SMEAR REVIEW

## 2017-05-26 LAB — CBC
HCT: 25 % — ABNORMAL LOW (ref 35.0–47.0)
Hemoglobin: 8.5 g/dL — ABNORMAL LOW (ref 12.0–16.0)
MCH: 32 pg (ref 26.0–34.0)
MCHC: 34 g/dL (ref 32.0–36.0)
MCV: 94.1 fL (ref 80.0–100.0)
PLATELETS: 225 10*3/uL (ref 150–440)
RBC: 2.65 MIL/uL — AB (ref 3.80–5.20)
RDW: 18.9 % — ABNORMAL HIGH (ref 11.5–14.5)
WBC: 12.7 10*3/uL — AB (ref 3.6–11.0)

## 2017-05-26 LAB — HEPATITIS C ANTIBODY (REFLEX): HCV Ab: <0.1 s/co ratio (ref 0.0–0.9)

## 2017-05-26 LAB — LACTATE DEHYDROGENASE: LDH: 223 U/L — ABNORMAL HIGH (ref 98–192)

## 2017-05-26 LAB — T4, FREE: Free T4: 0.92 ng/dL (ref 0.82–1.77)

## 2017-05-26 LAB — HEPATITIS B SURFACE ANTIBODY, QUANTITATIVE

## 2017-05-26 MED ORDER — LORAZEPAM 2 MG/ML IJ SOLN
1.0000 mg | Freq: Once | INTRAMUSCULAR | Status: AC
Start: 2017-05-26 — End: 2017-05-26
  Administered 2017-05-26: 1 mg via INTRAVENOUS
  Filled 2017-05-26: qty 1

## 2017-05-26 MED ORDER — LEVOTHYROXINE SODIUM 25 MCG PO TABS
25.0000 ug | ORAL_TABLET | Freq: Every day | ORAL | Status: DC
  Administered 2017-05-26 – 2017-05-31 (×5): 25 ug via ORAL
  Filled 2017-05-26 (×6): qty 1

## 2017-05-26 MED ORDER — TRAMADOL HCL 50 MG PO TABS
50.0000 mg | ORAL_TABLET | Freq: Four times a day (QID) | ORAL | Status: DC | PRN
  Administered 2017-05-27: 50 mg via ORAL
  Filled 2017-05-26: qty 1

## 2017-05-26 MED ORDER — SODIUM CHLORIDE 0.9 % IV SOLN
INTRAVENOUS | Status: AC
  Administered 2017-05-26 – 2017-05-27 (×3): via INTRAVENOUS

## 2017-05-26 MED ORDER — TRAZODONE HCL 50 MG PO TABS
25.0000 mg | ORAL_TABLET | Freq: Every evening | ORAL | Status: DC | PRN
  Administered 2017-05-29 – 2017-05-30 (×2): 25 mg via ORAL
  Filled 2017-05-26 (×2): qty 1

## 2017-05-26 MED ORDER — RISPERIDONE 0.25 MG PO TABS
0.2500 mg | ORAL_TABLET | Freq: Two times a day (BID) | ORAL | Status: DC | PRN
  Administered 2017-05-26 (×2): 0.25 mg via ORAL
  Filled 2017-05-26 (×2): qty 1

## 2017-05-26 MED ORDER — ACETAMINOPHEN 325 MG PO TABS
650.0000 mg | ORAL_TABLET | Freq: Four times a day (QID) | ORAL | Status: DC | PRN
Start: 2017-05-26 — End: 2017-05-31
  Administered 2017-05-26 – 2017-05-31 (×4): 650 mg via ORAL
  Filled 2017-05-26 (×5): qty 2

## 2017-05-26 NOTE — Progress Notes (Signed)
Patient extremely agitated unable to stay in bed. Pulling at all equipment and IV. MD notified. Orders received.

## 2017-05-26 NOTE — Progress Notes (Addendum)
Subjective:  1 Day Post-Op Procedure(s) (LRB): INTRAMEDULLARY (IM) NAIL INTERTROCHANTRIC (Left)    Patient reports pain as mild.  Objective:  Alert, mild confusion,,  Fidgets in bed.  Moves both legs some. No significant swelling.   VITALS:   Vitals:   05/26/17 0732 05/26/17 1100  BP: (!) 148/95 (!) 188/86  Pulse: 78 73  Resp:    Temp: 98.2 F (36.8 C)   SpO2: 99%     Neurologically intact ABD soft Neurovascular intact Sensation intact distally Intact pulses distally Dorsiflexion/Plantar flexion intact Incision: dressing C/D/I  LABS Recent Labs    05/24/17 2214 05/25/17 1238 05/25/17 1304 05/26/17 0419  HGB 6.9* 9.7* 10.2* 8.5*  HCT 20.5* 28.6* 30.0* 25.0*  WBC 5.4  --   --  12.7*  PLT 277  --   --  225    Recent Labs    05/24/17 2214 05/25/17 1304 05/26/17 0419  NA 135 141 138  K 4.3 4.3 4.8  BUN 18  --  31*  CREATININE 1.14*  --  1.24*  GLUCOSE 105* 141* 115*    Recent Labs    05/24/17 2255 05/25/17 1238  INR 1.73 1.85     Assessment/Plan: 1 Day Post-Op Procedure(s) (LRB): INTRAMEDULLARY (IM) NAIL INTERTROCHANTRIC (Left)   Advance diet Up with therapy D/C IV fluids Discharge to SNF   RTC to see DR Mack Guise in 2 weeks

## 2017-05-26 NOTE — Progress Notes (Signed)
Physical Therapy Treatment Patient Details Name: Theresa Lynch MRN: [id number] DOB: 12-Oct-1929 Today's Date: 05/26/2017    History of Present Illness Patient is an 84 y/o female that presents s/p fall and L hip fracture, IM nailing performed to repair hip. Of note she was noted to have abnormalities on CT scan, concern is for multiple myeloma and is being worked up by oncology team.     PT Comments    Patient seen for afternoon session, she is still very confused and having a difficult time staying awake throughout session. She is able to participate in bed exercises moreso than this morning, but continues to shift posteriorly and laterally to the R to avoid LLE weightbearing even in sitting. She has been disoriented all day per her daughter, and was unable to follow commands consistently in this session. She would still benefit from SNF placement at this time given her difficulty with OOB mobility.   Follow Up Recommendations  SNF     Equipment Recommendations  (TBD at next venue of care. )    Recommendations for Other Services       Precautions / Restrictions Precautions Precautions: Fall Restrictions Weight Bearing Restrictions: Yes LLE Weight Bearing: Weight bearing as tolerated    Mobility  Bed Mobility Overal bed mobility: Needs Assistance Bed Mobility: Supine to Sit;Sit to Supine     Supine to sit: Mod assist;Max assist Sit to supine: Mod assist;Max assist   General bed mobility comments: Patient is very lethargic and minimally participates in transfers.   Transfers                 General transfer comment: Deferred.   Ambulation/Gait                 Stairs             Wheelchair Mobility    Modified Rankin (Stroke Patients Only)       Balance Overall balance assessment: Needs assistance Sitting-balance support: Bilateral upper extremity supported Sitting balance-Leahy Scale: Zero Sitting balance - Comments: Patient is  leaning posteriorly throughout attempts.  Postural control: Posterior lean;Right lateral lean                                  Cognition Arousal/Alertness: Awake/alert;Lethargic Behavior During Therapy: Restless Overall Cognitive Status: (Patient is confused and largely transitions between sleep and waking moments)                                        Exercises General Exercises - Lower Extremity Ankle Circles/Pumps: AAROM;10 reps;Both Long Arc Quad: AAROM;Both;10 reps Heel Slides: AAROM;Both;10 reps Hip ABduction/ADduction: AAROM;Both;10 reps;Supine Straight Leg Raises: AAROM;Both;10 reps Hip Flexion/Marching: AROM;AAROM;10 reps;Both Other Exercises Other Exercises: Seated balance drills at EOB with pillows underneath LUE to facilitate weight shift to LUE. Used reaching to attempt to improve weight transition as well.     General Comments        Pertinent Vitals/Pain Pain Assessment: Faces Faces Pain Scale: Hurts even more Pain Location: L hip  Pain Descriptors / Indicators: Aching;Operative site guarding Pain Intervention(s): Limited activity within patient's tolerance;Monitored during session    [place] expects to be discharged to:: Skilled nursing facility Living Arrangements: Children             Additional Comments: Patient had been living  with her children prior to this fall.     Prior Function Level of Independence: Independent with assistive device(s)      Comments: Patient had been ambulating around the house with [facility] prior to this fall.    PT Goals (current goals can now be found in the care plan section) Acute Rehab PT Goals Patient Stated Goal: To return home as safely as possible.  PT Goal Formulation: With patient/family Time For Goal Achievement: 06/09/17 Potential to Achieve Goals: Fair Progress towards PT goals: Progressing toward goals    Frequency    BID      PT Plan Current plan  remains appropriate    Co-evaluation              AM-PAC PT "6 Clicks" Daily Activity  Outcome Measure  Difficulty turning over in bed (including adjusting bedclothes, sheets and blankets)?: A Lot Difficulty moving from lying on back to sitting on the side of the bed? : A Lot Difficulty sitting down on and standing up from a chair with arms (e.g., wheelchair, bedside commode, etc,.)?: Unable Help needed moving to and from a bed to chair (including a wheelchair)?: Total Help needed walking in hospital room?: Total Help needed climbing 3-5 steps with a railing? : Total 6 Click Score: 8    End of Session Equipment Utilized During Treatment: Gait belt Activity Tolerance: Patient tolerated treatment well Patient left: in bed;with bed alarm set;with family/visitor present;with call bell/phone within reach Nurse Communication: Mobility status PT Visit Diagnosis: Unsteadiness on feet (R26.81);Muscle weakness (generalized) (M62.81);History of falling (Z91.81)     Time: 1216-2446 PT Time Calculation (min) (ACUTE ONLY): 24 min  Charges:  $Therapeutic Exercise: 23-37 mins                    G Codes:       Royce Macadamia PT, DPT, CSCS   05/26/2017, 4:21 PM

## 2017-05-26 NOTE — Progress Notes (Signed)
[place] stopped by patient's room to say hello and to let them know that [facility] would be back after 2pm to assist with completing HCPOA as CH was on other side of unit working to complete paperwork. At this time there were no witnesses available.

## 2017-05-26 NOTE — Progress Notes (Signed)
[place] at [place] NAME: Theresa Lynch    MR#:  [id number]  DATE OF BIRTH:  1929-09-13  SUBJECTIVE:  CHIEF COMPLAINT:   Chief Complaint  Patient presents with  . Fall  . Hip Pain   -Postop day 1 after left hip fracture repair -Patient is very confused.  Family at bedside.  REVIEW OF SYSTEMS:  Review of Systems  Constitutional: Positive for malaise/fatigue. Negative for chills and fever.  HENT: Positive for hearing loss. Negative for congestion, ear discharge and nosebleeds.   Eyes: Negative for blurred vision and double vision.  Respiratory: Negative for cough, shortness of breath and wheezing.   Cardiovascular: Negative for chest pain, palpitations and leg swelling.  Gastrointestinal: Negative for abdominal pain, constipation, diarrhea, nausea and vomiting.  Genitourinary: Negative for dysuria.  Musculoskeletal: Positive for joint pain and myalgias.  Neurological: Negative for dizziness, focal weakness, seizures, weakness and headaches.  Psychiatric/Behavioral: Negative for depression.    DRUG ALLERGIES:   Allergies  Allergen Reactions  . Codeine Nausea And Vomiting  . Penicillins Swelling and Other (See Comments)    Lips swell Has patient had a PCN reaction causing immediate rash, facial/tongue/throat swelling, SOB or lightheadedness with hypotension: Yes Has patient had a PCN reaction causing severe rash involving mucus membranes or skin necrosis: No Has patient had a PCN reaction that required hospitalization: No Has patient had a PCN reaction occurring within the last 10 years: No If all of the above answers are "NO", then may proceed with Cephalosporin use.     VITALS:  Blood pressure (!) 188/86, pulse 73, temperature 98.2 F (36.8 C), resp. rate 18, height _0  (1.676 m), weight 59.9 kg (132 lb), SpO2 99 %.  PHYSICAL EXAMINATION:  Physical Exam  GENERAL:  84 y.o.-year-old elderly patient lying in the bed with no  acute distress.  EYES: Pupils equal, round, reactive to light and accommodation. No scleral icterus. Extraocular muscles intact.  HEENT: Head atraumatic, normocephalic. Oropharynx and nasopharynx clear.  NECK:  Supple, no jugular venous distention. No thyroid enlargement, no tenderness.  LUNGS: Normal breath sounds bilaterally, no wheezing, rales,rhonchi or crepitation. No use of accessory muscles of respiration. Decreased bibasilar breath sounds CARDIOVASCULAR: S1, S2 normal. No  rubs, or gallops. 3/6 systolic murmur present ABDOMEN: Soft, nontender, nondistended. Bowel sounds present. No organomegaly or mass.  EXTREMITIES: left thigh dressing in place. No pedal edema, cyanosis, or clubbing.  NEUROLOGIC: Cranial nerves II through XII are intact. Muscle strength 5/5 in all extremities. Sensation intact. Gait not checked. Global weakness PSYCHIATRIC: The patient is alert and oriented to self.  SKIN: No obvious rash, lesion, or ulcer.    LABORATORY PANEL:   CBC Recent Labs  Lab 05/26/17 0419  WBC 12.7*  HGB 8.5*  HCT 25.0*  PLT 225   ------------------------------------------------------------------------------------------------------------------  Chemistries  Recent Labs  Lab 05/26/17 0419  NA 138  K 4.8  CL 104  CO2 23  GLUCOSE 115*  BUN 31*  CREATININE 1.24*  CALCIUM 10.4*  AST 53*  ALT 26  ALKPHOS 75  BILITOT 2.1*   ------------------------------------------------------------------------------------------------------------------  Cardiac Enzymes No results for input(s): TROPONINI in the last 168 hours. ------------------------------------------------------------------------------------------------------------------  RADIOLOGY:  Dg Chest 1 View  Result Date: 05/24/2017 CLINICAL DATA:  Dizzy, hip fracture EXAM: CHEST  1 VIEW COMPARISON:  Report 02/29/2016 FINDINGS: Hyperinflation. Borderline to mild cardiomegaly. No acute opacity or effusion. No pneumothorax.  Postsurgical or posttraumatic deformity of the distal left clavicle. Prominent paratracheal  opacity. IMPRESSION: 1. No radiographic evidence for acute cardiopulmonary abnormality 2. Borderline to mild cardiomegaly 3. Paratracheal opacities, possible thyroid enlargement. Electronically Signed   By: Donavan Foil M.D.   On: 05/24/2017 22:48   Ct Head Wo Contrast  Result Date: 05/24/2017 CLINICAL DATA:  Dizziness EXAM: CT HEAD WITHOUT CONTRAST TECHNIQUE: Contiguous axial images were obtained from the base of the skull through the vertex without intravenous contrast. COMPARISON:  MRI brain dated 02/07/2006 FINDINGS: Brain: No evidence of acute infarction, hemorrhage, hydrocephalus, extra-axial collection or mass lesion/mass effect. 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 Ex vacuo dilatation of the occipital horn of the left lateral ventricle. Global cortical atrophy. Subcortical white matter and periventricular small vessel ischemic changes. Vascular: No hyperdense vessel or unexpected calcification. Skull: Innumerable lytic lesions throughout the calvarium. Sinuses/Orbits: Partial opacification of the left frontal sinus. Mastoid air cells are clear. Other: None. IMPRESSION: Innumerable lytic lesions throughout the calvarium. Correlate for multiple myeloma, lytic metastases, or hyperparathyroidism. No evidence of acute intracranial abnormality. 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 Atrophy with small vessel ischemic changes. Electronically Signed   By: Julian Hy M.D.   On: 05/24/2017 23:00   Dg Hip Operative Unilat W Or W/o Pelvis Left  Result Date: 05/25/2017 CLINICAL DATA:  OPEN REDUCTION INTERNAL FIXATION FOR FRACTURE EXAM: OPERATIVE LEFT HIP  2 VIEWS TECHNIQUE: Fluoroscopic spot image(s) were submitted for interpretation post-operatively. FLUOROSCOPY TIME:  1 MINUTES 6 SECONDS; 4 ACQUIRED IMAGES COMPARISON:  Pelvis and left hip images May 24, 2017  FINDINGS: Frontal and lateral views were obtained. There is screw and rod fixation through an intertrochanteric femur fracture on the left. Alignment at the fracture site is essentially anatomic. The tip of the screw is in the proximal femoral head. No dislocation. There is mild narrowing of the left hip joint. IMPRESSION: Postoperative fixation of intertrochanteric femur fracture on the left with alignment essentially anatomic at the fracture site. Screw tip in proximal femoral head. No dislocation. Mild narrowing left hip joint. Electronically Signed   By: Lowella Grip III M.D.   On: 05/25/2017 15:06   Dg Hip Unilat W Or Wo Pelvis 2-3 Views Left  Result Date: 05/24/2017 CLINICAL DATA:  Fall with left hip pain EXAM: DG HIP (WITH OR WITHOUT PELVIS) 2-3V LEFT COMPARISON:  None. FINDINGS: Osteopenia. SI joints non widened. Pubic symphysis is intact. Both femoral heads project in joint. Acute intertrochanteric fracture of the proximal left femur with minimal posterior angulation. Additional acute minimally displaced fractures involving the left superior and inferior pubic rami. IMPRESSION: 1. Acute left intertrochanteric fracture of the femur 2. Suspected acute fractures of the left superior and inferior pubic rami. Electronically Signed   By: Donavan Foil M.D.   On: 05/24/2017 22:46   Dg Femur Min 2 Views Left  Result Date: 05/25/2017 CLINICAL DATA:  Left IM nail. EXAM: LEFT FEMUR 2 VIEWS COMPARISON:  No recent prior. FINDINGS: ORIF left femur. Hardware intact. Anatomic alignment. No acute bony abnormality. IMPRESSION: ORIF left femur with anatomic alignment. Electronically Signed   By: Marcello Moores  Register   On: 05/25/2017 16:19   US Abdomen Limited Ruq  Result Date: 05/25/2017 CLINICAL DATA:  Elevated INR. EXAM: ULTRASOUND ABDOMEN LIMITED RIGHT UPPER QUADRANT COMPARISON:  None. FINDINGS: Gallbladder: No gallstones or wall thickening visualized. No sonographic Murphy sign noted by sonographer. Common bile  duct: Diameter: 3.5 mm Liver: No focal lesion identified. Within normal limits in parenchymal echogenicity. Portal vein is patent on color Doppler imaging  with normal direction of blood flow towards the liver. Additional finding: Small right pleural effusion. IMPRESSION: Small right pleural effusion.  Otherwise, normal examination. Electronically Signed   By: Claudie Revering M.D.   On: 05/25/2017 12:06    EKG:   Orders placed or performed during the hospital encounter of 05/24/17  . ED EKG  . ED EKG  . EKG 12-Lead  . EKG 12-Lead    ASSESSMENT AND PLAN:   Ridhima Golberg Dodsonis a 84 y.o.femalewith a history of hypertension as well as intracranial hemorrhage not on any blood thinners at this time who is presenting after losing her balance walking with her cane and falling on her left hip.     1.  Left intertrochanteric fracture with associated superior and inferior pelvic fractures. - -Appreciate orthopedic consult.  Patient is status post intramedullary fixation of left intertrochanteric hip fracture.  Postop day 1 today -Hemoglobin is stable after transfusion prior to surgery. -Delirium noted, discontinued IV narcotics.  Started on tramadol and muscle relaxants for pain -Continue physical therapy  2. Incidental finding of innumerable skull Lytic lesions:  Concern for multiple myeloma especially given her renal failure, hypercalcemia and anemia -Appreciate oncology consult -Related lab work ordered  3. Anemia: Likely anemia of chronic disease.  Work-up for myeloma being done -Received 2 units transfusion prior to surgery and hemoglobin at 8.5.  No indication for transfusion unless less than 7. -Continue to monitor -On iron supplements  4. CKD: Stage III; avoid nephrotoxic agents.  5. Hypertension: Continue metoprolol  6.  Acute delirium-discontinue morphine.  Started tramadol only for pain as needed.  Will add twice daily PRN risperidone.  Patient takes Paxil at home  which we will continue at this time  7. DVT prophylaxis: Lovenox started after surgery       All the records are reviewed and case discussed with Care Management/Social Workerr. Management plans discussed with the patient, family and they are in agreement.  CODE STATUS: DNR  TOTAL TIME TAKING CARE OF THIS PATIENT: 38 minutes.   POSSIBLE D/C IN 2-3  DAYS, DEPENDING ON CLINICAL CONDITION.   Gladstone Lighter M.D on 05/26/2017 at 1:29 PM  Between 7am to 6pm - Pager - [phone]  After 6pm go to [email or web address] - password [email or web address] [facility]  Office  [phone]  CC: Primary care physician; [name], MD

## 2017-05-26 NOTE — Evaluation (Signed)
Physical Therapy Evaluation Patient Details Name: Theresa Lynch MRN: [id number] DOB: 11/13/1929 Today's Date: 05/26/2017   History of Present Illness  Patient is an 84 y/o female that presents s/p fall and L hip fracture, IM nailing performed to repair hip. Of note she was noted to have abnormalities on CT scan, concern is for multiple myeloma and is being worked up by oncology team.   Clinical Impression  Patient had been living at home with her children with a SPC up until last week when she suffered a fall and sustained a L hip fracture. She has been confused after the operation, and had difficulty following commands and performing mobility related tasks this session. She was able to perform bed mobility with PT assistance, however she was unable to perform transfers due to cognitive limitations and had posterior lean with sitting balance. She was able to participate in limited there-ex bout, however began to become more lethargic throughout this session. She would benefit from SNF placement at discharge to improve her functional mobility prior to return home.     Follow Up Recommendations SNF    Equipment Recommendations  (TBD at next venue of care. )    Recommendations for Other Services       Precautions / Restrictions Precautions Precautions: Fall Restrictions Weight Bearing Restrictions: Yes LLE Weight Bearing: Weight bearing as tolerated      Mobility  Bed Mobility Overal bed mobility: Needs Assistance Bed Mobility: Supine to Sit;Sit to Supine     Supine to sit: Mod assist;Max assist Sit to supine: Mod assist;Max assist   General bed mobility comments: Patient is very pain limited and requires assistance for trunk mobility to control upright trunk motion.   Transfers                 General transfer comment: Deferred.   Ambulation/Gait                Stairs            Wheelchair Mobility    Modified Rankin (Stroke Patients Only)        Balance Overall balance assessment: Needs assistance Sitting-balance support: Bilateral upper extremity supported Sitting balance-Leahy Scale: Poor Sitting balance - Comments: Patient is leaning posteriorly throughout attempts.  Postural control: Posterior lean                                   Pertinent Vitals/Pain Pain Assessment: Faces Faces Pain Scale: Hurts even more Pain Location: L hip  Pain Descriptors / Indicators: Aching;Operative site guarding Pain Intervention(s): Limited activity within patient's tolerance;Monitored during session;Repositioned    Home Living Family/patient expects to be discharged to:: Skilled nursing facility Living Arrangements: Children               Additional Comments: Patient had been living with her children prior to this fall.     Prior Function Level of Independence: Independent with assistive device(s)         Comments: Patient had been ambulating around the house with [facility] prior to this fall.      Hand Dominance   Dominant Hand: Right    Extremity/Trunk Assessment   Upper Extremity Assessment Upper Extremity Assessment: Overall WFL for tasks assessed    Lower Extremity Assessment Lower Extremity Assessment: LLE deficits/detail LLE Deficits / Details: patient weight shifted/avoiding away from LLE.        Communication   Communication: (  Patient is confused but able to voice words.)  Cognition Arousal/Alertness: Awake/alert Behavior During Therapy: Impulsive Overall Cognitive Status: (Patient is confused and requires frequent cuing to stay oriented.)                                        General Comments      Exercises General Exercises - Lower Extremity Long Arc Quad: AAROM;Both;10 reps Hip Flexion/Marching: AROM;AAROM;10 reps;Both Other Exercises Other Exercises: Sitting balance at the EOB with bilateral use of UEs with cuing to bring trunk more erect.    Assessment/Plan     PT Assessment Patient needs continued PT services  PT Problem List Decreased strength;Decreased mobility;Decreased safety awareness;Decreased range of motion;Decreased knowledge of precautions;Decreased cognition;Decreased activity tolerance;Decreased balance;Decreased knowledge of use of DME;Cardiopulmonary status limiting activity       PT Treatment Interventions DME instruction;Therapeutic activities;Gait training;Cognitive remediation;Therapeutic exercise;Patient/family education;Stair training;Balance training;Neuromuscular re-education    PT Goals (Current goals can be found in the Care Plan section)  Acute Rehab PT Goals Patient Stated Goal: To return home as safely as possible.  PT Goal Formulation: With patient/family Time For Goal Achievement: 06/09/17 Potential to Achieve Goals: Fair    Frequency BID   Barriers to discharge Decreased caregiver support;Inaccessible home environment      Co-evaluation               AM-PAC PT "6 Clicks" Daily Activity  Outcome Measure Difficulty turning over in bed (including adjusting bedclothes, sheets and blankets)?: A Lot Difficulty moving from lying on back to sitting on the side of the bed? : A Lot Difficulty sitting down on and standing up from a chair with arms (e.g., wheelchair, bedside commode, etc,.)?: Unable Help needed moving to and from a bed to chair (including a wheelchair)?: Total Help needed walking in hospital room?: Total Help needed climbing 3-5 steps with a railing? : Total 6 Click Score: 8    End of Session Equipment Utilized During Treatment: Gait belt Activity Tolerance: Patient tolerated treatment well Patient left: in bed;with bed alarm set;with family/visitor present;with call bell/phone within reach Nurse Communication: Mobility status PT Visit Diagnosis: Unsteadiness on feet (R26.81);Muscle weakness (generalized) (M62.81);History of falling (Z91.81)    Time: 0937-1000 PT Time Calculation (min)  (ACUTE ONLY): 23 min   Charges:   PT Evaluation $PT Eval Moderate Complexity: 1 Mod PT Treatments $Therapeutic Exercise: 8-22 mins   PT G Codes:       Royce Macadamia PT, DPT, CSCS    05/26/2017, 3:06 PM

## 2017-05-26 NOTE — Progress Notes (Signed)
[place] follow-up with patient and family. Patient not able to complete HCPOA at this time. Patient not oriented to her current location. [place] spoke with patient's daughter and she stated that they would like to complete the [facility] when/if the patient is able too. [place] provided information on the process and provided emotional support. CH will leave note for unit [place] to follow-up with patient and [place] will visit patient later to provide bedside prayer per family request.

## 2017-05-26 NOTE — Consult Note (Signed)
[facility] CONSULT NOTE  Patient Care Team: [name], MD as PCP - General (Family Medicine)  CHIEF COMPLAINTS/PURPOSE OF CONSULTATION: Calvarial lytic lesions- multiple  HISTORY OF PRESENTING ILLNESS: Please note patient is a poor historian given dementia/delirium.  Son by the bedside.  Theresa Lynch 84 y.o.  female mild to moderate dementia/depression is currently admitted the hospital status post fall at home.  In the emergency room-CT scan of the head showed multiple calvarial lesions; but no acute process in the brain.  Patient noted to have a left hip fracture; and she underwent ORIF yesterday.  Postoperative course uneventful except for delirium at this time.  Patient of note is also been diagnosed with anemia hemoglobin around 9-10 over the last couple of years; also receiving B12 injections.  Patient on admission the hospital noted to have a hemoglobin nadir of 6.8 for which he received 2 units of PRBC blood transfusion hemoglobin today is 8.5.  Patient's renal function is slightly abnormal at 1.14; calcium is slightly abnormal at 10.4 [with an albumin of 2.4].  Patient's total protein is elevated at around 8.5.  As per family patient has not had any recent falls.  She walks with a walker.  She had been denying any pain.  No significant weight loss or loss of appetite.   ROS: Difficult to assess given patient's mental status changes.  MEDICAL HISTORY:  Past Medical History:  Diagnosis Date  . Arthritis    hands  . Claustrophobia   . Depression    with anxiety/h/o agoraphobia  . Glaucoma   . HOH (hard of hearing)   . Hyperlipidemia   . Hypertension   . PONV (postoperative nausea and vomiting)     SURGICAL HISTORY: Past Surgical History:  Procedure Laterality Date  . CATARACT EXTRACTION W/PHACO Right 03/15/2015   Procedure: CATARACT EXTRACTION PHACO AND INTRAOCULAR LENS PLACEMENT (IOC);  Surgeon: Ronnell Freshwater, MD;  Location: [facility];  Service: Ophthalmology;  Laterality: Right;  . ROTATOR CUFF REPAIR  1999   bilateral    SOCIAL HISTORY: Social History   Socioeconomic History  . Marital status: Married    Spouse name: Not on file  . Number of children: Not on file  . Years of education: Not on file  . Highest education level: Not on file  Occupational History  . Not on file  Social Needs  . Financial resource strain: Not on file  . Food insecurity:    Worry: Not on file    Inability: Not on file  . Transportation needs:    Medical: Not on file    Non-medical: Not on file  Tobacco Use  . Smoking status: Never Smoker  . Smokeless tobacco: Never Used  Substance and Sexual Activity  . Alcohol use: Yes    Alcohol/week: 4.2 oz    Types: 7 Glasses of wine per week  . Drug use: No  . Sexual activity: Not on file  Lifestyle  . Physical activity:    Days per week: Not on file    Minutes per session: Not on file  . Stress: Not on file  Relationships  . Social connections:    Talks on phone: Not on file    Gets together: Not on file    Attends religious service: Not on file    Active member of club or organization: Not on file    Attends meetings of clubs or organizations: Not on file    Relationship status: Not  on file  . Intimate partner violence:    Fear of current or ex partner: Not on file    Emotionally abused: Not on file    Physically abused: Not on file    Forced sexual activity: Not on file  Other Topics Concern  . Not on file  Social History Narrative  . Not on file    FAMILY HISTORY: History reviewed. No pertinent family history.  ALLERGIES:  is allergic to codeine and penicillins.  MEDICATIONS:  Current Facility-Administered Medications  Medication Dose Route Frequency Provider Last Rate Last Dose  . 0.9 % NaCl with KCl 20 mEq/ L  infusion   Intravenous Continuous Thornton Park, MD 75 mL/hr at 05/25/17 1818    . alum & mag hydroxide-simeth (MAALOX/MYLANTA)  200-200-20 MG/5ML suspension 30 mL  30 mL Oral Q4H PRN Thornton Park, MD      . bisacodyl (DULCOLAX) suppository 10 mg  10 mg Rectal Daily PRN Thornton Park, MD      . docusate sodium (COLACE) capsule 100 mg  100 mg Oral BID Thornton Park, MD   100 mg at 05/26/17 1101  . enoxaparin (LOVENOX) injection 30 mg  30 mg Subcutaneous Q24H Thornton Park, MD   30 mg at 05/26/17 0734  . ferrous sulfate tablet 325 mg  325 mg Oral TID Lorre Munroe, MD   325 mg at 05/26/17 0816  . hydrALAZINE (APRESOLINE) injection 10 mg  10 mg Intravenous Q6H PRN Thornton Park, MD   10 mg at 05/26/17 0033  . levothyroxine (SYNTHROID, LEVOTHROID) tablet 25 mcg  25 mcg Oral QAC breakfast Harrie Foreman, MD   25 mcg at 05/26/17 0734  . magnesium citrate solution 1 Bottle  1 Bottle Oral Once PRN Thornton Park, MD      . methocarbamol (ROBAXIN) tablet 500 mg  500 mg Oral Q6H PRN Thornton Park, MD       Or  . methocarbamol (ROBAXIN) 500 mg in dextrose 5 % 50 mL IVPB  500 mg Intravenous Q6H PRN Thornton Park, MD      . metoprolol tartrate (LOPRESSOR) tablet 25 mg  25 mg Oral BID Thornton Park, MD   25 mg at 05/26/17 1101  . morphine 2 MG/ML injection 2-4 mg  2-4 mg Intravenous Q4H PRN Thornton Park, MD   2 mg at 05/25/17 1031  . mupirocin ointment (BACTROBAN) 2 % 1 application  1 application Nasal BID Thornton Park, MD   1 application at 23/30/07 1102  . ondansetron (ZOFRAN) tablet 4 mg  4 mg Oral Q6H PRN Thornton Park, MD       Or  . ondansetron [facility]) injection 4 mg  4 mg Intravenous Q6H PRN Thornton Park, MD      . oxyCODONE (Oxy IR/ROXICODONE) immediate release tablet 5-10 mg  5-10 mg Oral Q4H PRN Thornton Park, MD      . pantoprazole (PROTONIX) EC tablet 40 mg  40 mg Oral Daily Thornton Park, MD   40 mg at 05/26/17 1101  . PARoxetine (PAXIL) tablet 40 mg  40 mg Oral QHS Thornton Park, MD   40 mg at 05/25/17 2058  . polyethylene glycol (MIRALAX / GLYCOLAX) packet 17 g   17 g Oral Daily PRN Thornton Park, MD      . timolol (TIMOPTIC) 0.5 % ophthalmic solution 1 drop  1 drop Both Eyes QHS Thornton Park, MD   1 drop at 05/25/17 2111  . traMADol (ULTRAM) tablet 50 mg  50 mg Oral Q6H PRN  Thornton Park, MD   50 mg at 05/26/17 0033  . traZODone (DESYREL) tablet 25 mg  25 mg Oral QHS Thornton Park, MD   25 mg at 05/25/17 2058      .  PHYSICAL EXAMINATION:  Vitals:   05/26/17 0732 05/26/17 1100  BP: (!) 148/95 (!) 188/86  Pulse: 78 73  Resp:    Temp: 98.2 F (36.8 C)   SpO2: 99%    Filed Weights   05/24/17 2206 05/25/17 0207  Weight: 140 lb (63.5 kg) 132 lb (59.9 kg)    GENERAL: Well-nourished well-developed; Alert, but confused.  No distress and comfortable.   Accompanied by her son daughter-in-law. EYES: Positive for pallor. OROPHARYNX: no thrush or ulceration. NECK: supple, no masses felt LYMPH:  no palpable lymphadenopathy in the cervical, axillary or inguinal regions LUNGS: decreased breath sounds to auscultation at bases and  No wheeze or crackles HEART/CVS: regular rate & rhythm and no murmurs; No lower extremity edema ABDOMEN: abdomen soft, non-tender and normal bowel sounds Musculoskeletal:no cyanosis of digits and no clubbing  PSYCH: alert & oriented x 3 with fluent speech NEURO: no focal motor/sensory deficits SKIN:  no rashes or significant lesions  LABORATORY DATA:  I have reviewed the data as listed Lab Results  Component Value Date   WBC 12.7 (H) 05/26/2017   HGB 8.5 (L) 05/26/2017   HCT 25.0 (L) 05/26/2017   MCV 94.1 05/26/2017   PLT 225 05/26/2017   Recent Labs    02/10/17 1451 05/24/17 2214 05/25/17 1304 05/26/17 0419  NA 137 135 141 138  K 3.7 4.3 4.3 4.8  CL 99* 100*  --  104  CO2 25 24  --  23  GLUCOSE 107* 105* 141* 115*  BUN 19 18  --  31*  CREATININE 1.40* 1.14*  --  1.24*  CALCIUM 9.5 8.5*  --  10.4*  GFRNONAA 33* 43*  --  38*  GFRAA 39* 49*  --  45*  PROT 10.2*  --   --  8.5*  ALBUMIN  2.9*  --   --  2.4*  AST 18  --   --  53*  ALT 9*  --   --  26  ALKPHOS 91  --   --  75  BILITOT 1.5*  --   --  2.1*    RADIOGRAPHIC STUDIES: I have personally reviewed the radiological images as listed and agreed with the findings in the report. Dg Chest 1 View  Result Date: 05/24/2017 CLINICAL DATA:  Dizzy, hip fracture EXAM: CHEST  1 VIEW COMPARISON:  Report 02/29/2016 FINDINGS: Hyperinflation. Borderline to mild cardiomegaly. No acute opacity or effusion. No pneumothorax. Postsurgical or posttraumatic deformity of the distal left clavicle. Prominent paratracheal opacity. IMPRESSION: 1. No radiographic evidence for acute cardiopulmonary abnormality 2. Borderline to mild cardiomegaly 3. Paratracheal opacities, possible thyroid enlargement. Electronically Signed   By: Donavan Foil M.D.   On: 05/24/2017 22:48   Ct Head Wo Contrast  Result Date: 05/24/2017 CLINICAL DATA:  Dizziness EXAM: CT HEAD WITHOUT CONTRAST TECHNIQUE: Contiguous axial images were obtained from the base of the skull through the vertex without intravenous contrast. COMPARISON:  MRI brain dated 02/07/2006 FINDINGS: Brain: No evidence of acute infarction, hemorrhage, hydrocephalus, extra-axial collection or mass lesion/mass effect. 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 Ex vacuo dilatation of the occipital horn of the left lateral ventricle. Global cortical atrophy. Subcortical white matter and periventricular small vessel ischemic changes. Vascular: No hyperdense vessel or unexpected  calcification. Skull: Innumerable lytic lesions throughout the calvarium. Sinuses/Orbits: Partial opacification of the left frontal sinus. Mastoid air cells are clear. Other: None. IMPRESSION: Innumerable lytic lesions throughout the calvarium. Correlate for multiple myeloma, lytic metastases, or hyperparathyroidism. No evidence of acute intracranial abnormality. 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  Atrophy with small vessel ischemic changes. Electronically Signed   By: Julian Hy M.D.   On: 05/24/2017 23:00   Dg Hip Operative Unilat W Or W/o Pelvis Left  Result Date: 05/25/2017 CLINICAL DATA:  OPEN REDUCTION INTERNAL FIXATION FOR FRACTURE EXAM: OPERATIVE LEFT HIP  2 VIEWS TECHNIQUE: Fluoroscopic spot image(s) were submitted for interpretation post-operatively. FLUOROSCOPY TIME:  1 MINUTES 6 SECONDS; 4 ACQUIRED IMAGES COMPARISON:  Pelvis and left hip images May 24, 2017 FINDINGS: Frontal and lateral views were obtained. There is screw and rod fixation through an intertrochanteric femur fracture on the left. Alignment at the fracture site is essentially anatomic. The tip of the screw is in the proximal femoral head. No dislocation. There is mild narrowing of the left hip joint. IMPRESSION: Postoperative fixation of intertrochanteric femur fracture on the left with alignment essentially anatomic at the fracture site. Screw tip in proximal femoral head. No dislocation. Mild narrowing left hip joint. Electronically Signed   By: Lowella Grip III M.D.   On: 05/25/2017 15:06   Dg Hip Unilat W Or Wo Pelvis 2-3 Views Left  Result Date: 05/24/2017 CLINICAL DATA:  Fall with left hip pain EXAM: DG HIP (WITH OR WITHOUT PELVIS) 2-3V LEFT COMPARISON:  None. FINDINGS: Osteopenia. SI joints non widened. Pubic symphysis is intact. Both femoral heads project in joint. Acute intertrochanteric fracture of the proximal left femur with minimal posterior angulation. Additional acute minimally displaced fractures involving the left superior and inferior pubic rami. IMPRESSION: 1. Acute left intertrochanteric fracture of the femur 2. Suspected acute fractures of the left superior and inferior pubic rami. Electronically Signed   By: Donavan Foil M.D.   On: 05/24/2017 22:46   Dg Femur Min 2 Views Left  Result Date: 05/25/2017 CLINICAL DATA:  Left IM nail. EXAM: LEFT FEMUR 2 VIEWS COMPARISON:  No recent prior.  FINDINGS: ORIF left femur. Hardware intact. Anatomic alignment. No acute bony abnormality. IMPRESSION: ORIF left femur with anatomic alignment. Electronically Signed   By: Marcello Moores  Register   On: 05/25/2017 16:19   US Abdomen Limited Ruq  Result Date: 05/25/2017 CLINICAL DATA:  Elevated INR. EXAM: ULTRASOUND ABDOMEN LIMITED RIGHT UPPER QUADRANT COMPARISON:  None. FINDINGS: Gallbladder: No gallstones or wall thickening visualized. No sonographic Murphy sign noted by sonographer. Common bile duct: Diameter: 3.5 mm Liver: No focal lesion identified. Within normal limits in parenchymal echogenicity. Portal vein is patent on color Doppler imaging with normal direction of blood flow towards the liver. Additional finding: Small right pleural effusion. IMPRESSION: Small right pleural effusion.  Otherwise, normal examination. Electronically Signed   By: Claudie Revering M.D.   On: 05/25/2017 12:06    ASSESSMENT & PLAN:   # 84 year old female patient with history of mild-moderate dementia; currently admitted the hospital for left hip fracture; noted to have abnormal calvarial lytic lesions on the CT scan of the brain; also anemia/mild chronic renal insufficiency.  #Multiple lytic calvarial lesions; chronic anemia [hemoglobin nadir 6.8]; mild hypercalcemia calcium 10.4 [low albumin]; renal insufficiency creatinine 1.3; with elevated total protein-highly concerning for multiple myeloma.  I would recommend further work-up including M protein; kappa lambda light chain ratio; 24-hour protein/creatinine ratio/M protein.  I would currently  hold off a bone marrow biopsy-given the patient's facility/recent hip surgery.  Skeletal survey would be reasonable with the patient is more coherent/prior to discharge.  Discussed that she would have treatment options available if this was to be diagnosed with multiple myeloma; although incurable.  Mild hypercalcemia  #Mild hypercalcemia-check ionized calcium; if high recommend  Zometa/prior to discharge.  # Mild to moderate dementia/currently delirious post surgery.  #Left hip fracture-question pathologic versus status post fall.  Await biopsy results-if sent.  Will check with pathology.  The above plan of care was discussed with the patient's family in detail; all questions were answered to their satisfaction.   Thank you Dr. Tressia Miners for allowing me to participate in the care of your pleasant patient. Please do not hesitate to contact me with questions or concerns in the interim.  Also discussed with Dr. Tressia Miners  All questions were answered. The patient's family knows to call the clinic with any problems, questions or concerns.  # I reviewed the blood work- with the patient in detail; also reviewed the imaging independently [as summarized above]; and with the patient in detail.      Cammie Sickle, MD 05/26/2017 12:32 PM

## 2017-05-26 NOTE — Progress Notes (Signed)
Elevated TSH noted. Check free T4 and albumin (entire CMP). Start Synthroid

## 2017-05-27 LAB — PTH, INTACT AND CALCIUM
CALCIUM TOTAL (PTH): 10.1 mg/dL (ref 8.7–10.3)
PTH: 6 pg/mL — AB (ref 15–65)

## 2017-05-27 LAB — BASIC METABOLIC PANEL
Anion gap: 9 (ref 5–15)
BUN: 39 mg/dL — AB (ref 6–20)
CO2: 23 mmol/L (ref 22–32)
Calcium: 10.7 mg/dL — ABNORMAL HIGH (ref 8.9–10.3)
Chloride: 105 mmol/L (ref 101–111)
Creatinine, Ser: 1.49 mg/dL — ABNORMAL HIGH (ref 0.44–1.00)
GFR calc Af Amer: 36 mL/min — ABNORMAL LOW (ref >60)
GFR, EST NON AFRICAN AMERICAN: 31 mL/min — AB (ref >60)
GLUCOSE: 116 mg/dL — AB (ref 65–99)
POTASSIUM: 4.2 mmol/L (ref 3.5–5.1)
Sodium: 137 mmol/L (ref 135–145)

## 2017-05-27 LAB — CBC
HCT: 24.7 % — ABNORMAL LOW (ref 35.0–47.0)
Hemoglobin: 8.4 g/dL — ABNORMAL LOW (ref 12.0–16.0)
MCH: 32.6 pg (ref 26.0–34.0)
MCHC: 33.9 g/dL (ref 32.0–36.0)
MCV: 96.2 fL (ref 80.0–100.0)
Platelets: 195 10*3/uL (ref 150–440)
RBC: 2.56 MIL/uL — AB (ref 3.80–5.20)
RDW: 19.2 % — AB (ref 11.5–14.5)
WBC: 8.4 10*3/uL (ref 3.6–11.0)

## 2017-05-27 LAB — PREPARE RBC (CROSSMATCH)

## 2017-05-27 MED ORDER — ENSURE ENLIVE PO LIQD
237.0000 mL | Freq: Two times a day (BID) | ORAL | Status: DC
  Administered 2017-05-27 – 2017-05-31 (×8): 237 mL via ORAL

## 2017-05-27 MED ORDER — ZOLEDRONIC ACID 4 MG/5ML IV CONC
3.0000 mg | Freq: Once | INTRAVENOUS | Status: AC
  Administered 2017-05-27: 3 mg via INTRAVENOUS
  Filled 2017-05-27: qty 3.75

## 2017-05-27 MED ORDER — RISPERIDONE 0.5 MG PO TABS
0.5000 mg | ORAL_TABLET | Freq: Two times a day (BID) | ORAL | Status: DC | PRN
  Administered 2017-05-27: 0.5 mg via ORAL
  Filled 2017-05-27: qty 1

## 2017-05-27 MED ORDER — LORAZEPAM 2 MG/ML IJ SOLN
0.5000 mg | Freq: Three times a day (TID) | INTRAMUSCULAR | Status: DC | PRN
Start: 2017-05-27 — End: 2017-05-29
  Administered 2017-05-27: 0.5 mg via INTRAVENOUS
  Filled 2017-05-27: qty 1

## 2017-05-27 NOTE — Progress Notes (Signed)
Patient currently on a 24 hour urine scree. She has not urinated in 8 hours. Patient bladder scanned greater than 400. MD notified. No orders received.

## 2017-05-27 NOTE — Progress Notes (Signed)
[facility] [place] NAME: Theresa Lynch    MR#:  [id number]  DATE OF BIRTH:  [birth year]  SUBJECTIVE:  CHIEF COMPLAINT:   Chief Complaint  Patient presents with  . Fall  . Hip Pain   -Postop day 2 after left hip fracture repair -Very delirious last night, received Ativan and slept most of the night.  REVIEW OF SYSTEMS:  Review of Systems  Constitutional: Positive for malaise/fatigue. Negative for chills and fever.  HENT: Positive for hearing loss. Negative for congestion, ear discharge and nosebleeds.   Eyes: Negative for blurred vision and double vision.  Respiratory: Negative for cough, shortness of breath and wheezing.   Cardiovascular: Negative for chest pain, palpitations and leg swelling.  Gastrointestinal: Negative for abdominal pain, constipation, diarrhea, nausea and vomiting.  Genitourinary: Negative for dysuria.  Musculoskeletal: Positive for joint pain and myalgias.  Neurological: Negative for dizziness, focal weakness, seizures, weakness and headaches.  Psychiatric/Behavioral: Negative for depression.    DRUG ALLERGIES:   Allergies  Allergen Reactions  . Codeine Nausea And Vomiting  . Penicillins Swelling and Other (See Comments)    Lips swell Has patient had a PCN reaction causing immediate rash, facial/tongue/throat swelling, SOB or lightheadedness with hypotension: Yes Has patient had a PCN reaction causing severe rash involving mucus membranes or skin necrosis: No Has patient had a PCN reaction that required hospitalization: No Has patient had a PCN reaction occurring within the last 10 years: No If all of the above answers are "NO", then may proceed with Cephalosporin use.     VITALS:  Blood pressure (!) 171/50, pulse 65, temperature 98.1 F (36.7 C), resp. rate 16, height 5' 6" (1.676 m), weight 59.9 kg (132 lb), SpO2 97 %.  PHYSICAL EXAMINATION:  Physical Exam  GENERAL:  84 y.o.-year-old elderly  patient lying in the bed with no acute distress.  EYES: Pupils equal, round, reactive to light and accommodation. No scleral icterus. Extraocular muscles intact.  HEENT: Head atraumatic, normocephalic. Oropharynx and nasopharynx clear.  NECK:  Supple, no jugular venous distention. No thyroid enlargement, no tenderness.  LUNGS: Normal breath sounds bilaterally, no wheezing, rales,rhonchi or crepitation. No use of accessory muscles of respiration. Decreased bibasilar breath sounds CARDIOVASCULAR: S1, S2 normal. No  rubs, or gallops. 3/6 systolic murmur present ABDOMEN: Soft, nontender, nondistended. Bowel sounds present. No organomegaly or mass.  EXTREMITIES: left thigh dressing in place. No pedal edema, cyanosis, or clubbing.  NEUROLOGIC: Cranial nerves II through XII are intact. Muscle strength 5/5 in all extremities except left leg due to pain from recent surgery. Sensation intact. Gait not checked. Global weakness PSYCHIATRIC: The patient is sleepy, but arousable and following commands.  SKIN: No obvious rash, lesion, or ulcer.    LABORATORY PANEL:   CBC Recent Labs  Lab 05/27/17 0353  WBC 8.4  HGB 8.4*  HCT 24.7*  PLT 195   ------------------------------------------------------------------------------------------------------------------  Chemistries  Recent Labs  Lab 05/26/17 0419 05/27/17 0353  NA 138 137  K 4.8 4.2  CL 104 105  CO2 23 23  GLUCOSE 115* 116*  BUN 31* 39*  CREATININE 1.24* 1.49*  CALCIUM 10.4* 10.7*  AST 53*  --   ALT 26  --   ALKPHOS 75  --   BILITOT 2.1*  --    ------------------------------------------------------------------------------------------------------------------  Cardiac Enzymes No results for input(s): TROPONINI in the last 168 hours. ------------------------------------------------------------------------------------------------------------------  RADIOLOGY:  Dg Hip Operative Unilat W Or W/o Pelvis Left  Result Date:  05/25/2017 CLINICAL DATA:  OPEN REDUCTION INTERNAL FIXATION FOR FRACTURE EXAM: OPERATIVE LEFT HIP  2 VIEWS TECHNIQUE: Fluoroscopic spot image(s) were submitted for interpretation post-operatively. FLUOROSCOPY TIME:  1 MINUTES 6 SECONDS; 4 ACQUIRED IMAGES COMPARISON:  Pelvis and left hip images May 24, 2017 FINDINGS: Frontal and lateral views were obtained. There is screw and rod fixation through an intertrochanteric femur fracture on the left. Alignment at the fracture site is essentially anatomic. The tip of the screw is in the proximal femoral head. No dislocation. There is mild narrowing of the left hip joint. IMPRESSION: Postoperative fixation of intertrochanteric femur fracture on the left with alignment essentially anatomic at the fracture site. Screw tip in proximal femoral head. No dislocation. Mild narrowing left hip joint. Electronically Signed   By: Lowella Grip III M.D.   On: 05/25/2017 15:06   Dg Femur Min 2 Views Left  Result Date: 05/25/2017 CLINICAL DATA:  Left IM nail. EXAM: LEFT FEMUR 2 VIEWS COMPARISON:  No recent prior. FINDINGS: ORIF left femur. Hardware intact. Anatomic alignment. No acute bony abnormality. IMPRESSION: ORIF left femur with anatomic alignment. Electronically Signed   By: Marcello Moores  Register   On: 05/25/2017 16:19   US Abdomen Limited Ruq  Result Date: 05/25/2017 CLINICAL DATA:  Elevated INR. EXAM: ULTRASOUND ABDOMEN LIMITED RIGHT UPPER QUADRANT COMPARISON:  None. FINDINGS: Gallbladder: No gallstones or wall thickening visualized. No sonographic Murphy sign noted by sonographer. Common bile duct: Diameter: 3.5 mm Liver: No focal lesion identified. Within normal limits in parenchymal echogenicity. Portal vein is patent on color Doppler imaging with normal direction of blood flow towards the liver. Additional finding: Small right pleural effusion. IMPRESSION: Small right pleural effusion.  Otherwise, normal examination. Electronically Signed   By: Claudie Revering M.D.   On:  05/25/2017 12:06    EKG:   Orders placed or performed during the hospital encounter of 05/24/17  . ED EKG  . ED EKG  . EKG 12-Lead  . EKG 12-Lead    ASSESSMENT AND PLAN:   Theresa Fales Dodsonis a 84 y.o.femalewith a history of hypertension as well as intracranial hemorrhage not on any blood thinners at this time who is presenting after losing her balance walking with her cane and falling on her left hip.     1.  Left intertrochanteric fracture with associated superior and inferior pelvic fractures. - -Appreciate orthopedic consult.  Patient is status post intramedullary fixation of left intertrochanteric hip fracture.  Postop day 2 today -Hemoglobin is stable after transfusion prior to surgery. -Delirium noted, discontinued IV narcotics.  Started on tramadol and muscle relaxants for pain -Continue physical therapy  2. Incidental finding of innumerable skull Lytic lesions:  Concern for multiple myeloma especially given her renal failure, hypercalcemia and anemia -Appreciate oncology consult -Related lab work ordered - on a 24hr urine collection today  3. Anemia: Likely anemia of chronic disease.  Work-up for myeloma being done -Received 2 units transfusion prior to surgery and hemoglobin at 8.5.  No indication for transfusion unless less than 7. -Continue to monitor -On iron supplements  4. CKD: Stage III; avoid nephrotoxic agents.  5. Hypertension: Continue metoprolol  6.  Acute delirium-discontinued morphine.   -Started tramadol only for pain as needed. added twice daily PRN risperidone.  Patient takes Paxil at home which we will continue at this time -Avoid benzodiazepines if possible.  7. DVT prophylaxis: Lovenox started after surgery     All the records are reviewed and case discussed with Care Management/Social Workerr. Management  plans discussed with the patient, family and they are in agreement.  CODE STATUS: DNR  TOTAL TIME TAKING CARE OF  THIS PATIENT: 38 minutes.   POSSIBLE D/C IN 2-3  DAYS, DEPENDING ON CLINICAL CONDITION.   Gladstone Lighter M.D on 05/27/2017 at 9:37 AM  Between 7am to 6pm - Pager - [phone]  After 6pm go to [email or web address] - password [email or web address] [facility]  Office  [phone]  CC: Primary care physician; [name], MD

## 2017-05-27 NOTE — Progress Notes (Signed)
Pt alert but very confused. Family at bedside. IV infusing NS. Pt on room air. Risperidone given earlier for agitation with no relief. Family requested ativan. MD [facility] notified. Verbal orders for 0.5mg  of ativan Q8hrs PRN. Medication given. Will follow up.

## 2017-05-27 NOTE — Progress Notes (Signed)
Theresa Lynch   DOB:10/08/1929   MR#:[id number]    Subjective: Patient still confused/arousable-but drowsy.  Accompanied by son/daughter-in-law.   As per family-patient resting most of the time; mumbles here and there.  But no significant pain.  ROS: Difficult to assess given patient's mentation  Objective:  Vitals:   05/27/17 0742 05/27/17 1016  BP: (!) 171/50 (!) 177/91  Pulse: 65 75  Resp: 16   Temp: 98.1 F (36.7 C)   SpO2:       Intake/Output Summary (Last 24 hours) at 05/27/2017 1310 Last data filed at 05/27/2017 0650 Gross per 24 hour  Intake 150 ml  Output 100 ml  Net 50 ml    GENERAL: Well-nourished well-developed;drowsy but confused.  No distress and comfortable.   Accompanied by her son and daughter-in-law. EYES: Positive for pallor. OROPHARYNX: no thrush or ulceration. NECK: supple, no masses felt LYMPH:  no palpable lymphadenopathy in the cervical, axillary or inguinal regions LUNGS: decreased breath sounds to auscultation at bases and  No wheeze or crackles HEART/CVS: regular rate & rhythm and no murmurs; No lower extremity edema ABDOMEN: abdomen soft, non-tender and normal bowel sounds Musculoskeletal:no cyanosis of digits and no clubbing  PSYCH: drowsy/ arousable; Oriented x0.  NEURO: no focal motor/sensory deficits SKIN:  no rashes or significant lesions    Labs:  Lab Results  Component Value Date   WBC 8.4 05/27/2017   HGB 8.4 (L) 05/27/2017   HCT 24.7 (L) 05/27/2017   MCV 96.2 05/27/2017   PLT 195 05/27/2017   NEUTROABS 3.7 05/24/2017    Lab Results  Component Value Date   NA 137 05/27/2017   K 4.2 05/27/2017   CL 105 05/27/2017   CO2 23 05/27/2017    Studies:  Dg Hip Operative Unilat W Or W/o Pelvis Left  Result Date: 05/25/2017 CLINICAL DATA:  OPEN REDUCTION INTERNAL FIXATION FOR FRACTURE EXAM: OPERATIVE LEFT HIP  2 VIEWS TECHNIQUE: Fluoroscopic spot image(s) were submitted for interpretation post-operatively. FLUOROSCOPY TIME:  1  MINUTES 6 SECONDS; 4 ACQUIRED IMAGES COMPARISON:  Pelvis and left hip images May 24, 2017 FINDINGS: Frontal and lateral views were obtained. There is screw and rod fixation through an intertrochanteric femur fracture on the left. Alignment at the fracture site is essentially anatomic. The tip of the screw is in the proximal femoral head. No dislocation. There is mild narrowing of the left hip joint. IMPRESSION: Postoperative fixation of intertrochanteric femur fracture on the left with alignment essentially anatomic at the fracture site. Screw tip in proximal femoral head. No dislocation. Mild narrowing left hip joint. Electronically Signed   By: William  Woodruff III M.D.   On: 05/25/2017 15:06   Dg Femur Min 2 Views Left  Result Date: 05/25/2017 CLINICAL DATA:  Left IM nail. EXAM: LEFT FEMUR 2 VIEWS COMPARISON:  No recent prior. FINDINGS: ORIF left femur. Hardware intact. Anatomic alignment. No acute bony abnormality. IMPRESSION: ORIF left femur with anatomic alignment. Electronically Signed   By: Thomas  Register   On: 05/25/2017 16:19    Assessment & Plan:   # 84-year-old female patient with history of mild-moderate dementia; currently admitted the hospital for left hip fracture; noted to have abnormal calvarial lytic lesions on the CT scan of the brain; also anemia/mild chronic renal insufficiency.  # Multiple lytic calvarial lesions; chronic anemia [hemoglobin nadir 6.8]; mild hypercalcemia calcium 10.7 [low albumin]; renal insufficiency creatinine 1.3; with elevated total protein-highly concerning for multiple myeloma.   Myeloma work-up is pending.  Ionized calcium pending [  see discussion below].  With regards to multiple myeloma treatment -I would recommend holding steroids-given patient's current mental status changes/acute delirium [steroids like dexamethasone can exacerbate delirium].   #Mild-mod  hypercalcemia-  Recommend bisphosphonates Zometa 3 mg [given renal insufficiency]; discussed the  potential side effects including but not limited to hypocalcemia/ONJ.   # Mild to moderate dementia/currently delirious post surgery-.  #Left hip fracture-question pathologic versus status post fall.  Await biopsy results-if sent.  Will check with pathology.  #Long discussion with the patient's-son/daughter-in-law regarding overall poor prognosis/incurability of multiple myeloma-which in general could be 82treatable.  However, given patient's age/multiple comorbidities including dementia-this could be challenging.  Also discussed that if untreated-her biggest threat to life would be worsening renal function/hypercalcemia.   #Also discussed with Dr. Kalisetti regarding the above plan.   R , MD 05/27/2017  1:10 PM  

## 2017-05-27 NOTE — Progress Notes (Signed)
Subjective: 2 Days Post-Op Procedure(s) (LRB): INTRAMEDULLARY (IM) NAIL INTERTROCHANTRIC (Left)    Patient reports pain as mild.  Objective: Dressing dry.  Confused.  Moving around bed.  Not much PO intake.   VITALS:   Vitals:   05/27/17 0742 05/27/17 1016  BP: (!) 171/50 (!) 177/91  Pulse: 65 75  Resp: 16   Temp: 98.1 F (36.7 C)   SpO2:      Neurologically intact ABD soft Neurovascular intact Sensation intact distally Intact pulses distally Dorsiflexion/Plantar flexion intact Incision: dressing C/D/I  LABS Recent Labs    05/24/17 2214  05/25/17 1304 05/26/17 0419 05/27/17 0353  HGB 6.9*   < > 10.2* 8.5* 8.4*  HCT 20.5*   < > 30.0* 25.0* 24.7*  WBC 5.4  --   --  12.7* 8.4  PLT 277  --   --  225 195   < > = values in this interval not displayed.    Recent Labs    05/24/17 2214 05/25/17 1304 05/26/17 0419 05/27/17 0353  NA 135 141 138 137  K 4.3 4.3 4.8 4.2  BUN 18  --  31* 39*  CREATININE 1.14*  --  1.24* 1.49*  GLUCOSE 105* 141* 115* 116*    Recent Labs    05/24/17 2255 05/25/17 1238  INR 1.73 1.85     Assessment/Plan: 2 Days Post-Op Procedure(s) (LRB): INTRAMEDULLARY (IM) NAIL INTERTROCHANTRIC (Left)   Advance diet Up with therapy Discharge to SNF   RTC Dr Mack Guise  2 weeks  Lovenox for DVT for 4 weeks

## 2017-05-27 NOTE — Progress Notes (Signed)
PT Cancellation Note  Patient Details Name: Theresa Lynch MRN: [id number] DOB: 1929-11-07   Cancelled Treatment:    Reason Eval/Treat Not Completed: Patient's level of consciousness;Fatigue/lethargy limiting ability to participate. Chart reviewed, RN consulted. Holding pt treatment at this time due to continued agitation and lethargy. Spoke with daughter and granddaughter at length about the patient's cognition this date. Despite continued agitation, somnolence, slurred speech, family reports this is not her baseline. Granddaughter assists  With repositioning the patient higher in bed, slide toward [facility], floating of heels, back of bed elevated. Pt appears comfortably resting. Will attempt at later date/time.   2:45 PM, 05/27/17 Etta Grandchild, PT, DPT Physical Therapist - [facility]  [phone] ([place])     Rosebud C 05/27/2017, 2:42 PM

## 2017-05-28 ENCOUNTER — Encounter: Payer: Self-pay | Admitting: Orthopedic Surgery

## 2017-05-28 LAB — KAPPA/LAMBDA LIGHT CHAINS
KAPPA, LAMDA LIGHT CHAIN RATIO: 199.44 — AB (ref 0.26–1.65)
Kappa free light chain: 319.1 mg/L — ABNORMAL HIGH (ref 3.3–19.4)
LAMDA FREE LIGHT CHAINS: 1.6 mg/L — AB (ref 5.7–26.3)

## 2017-05-28 LAB — CBC
HEMATOCRIT: 25.2 % — AB (ref 35.0–47.0)
HEMOGLOBIN: 8.5 g/dL — AB (ref 12.0–16.0)
MCH: 32.4 pg (ref 26.0–34.0)
MCHC: 33.8 g/dL (ref 32.0–36.0)
MCV: 96.1 fL (ref 80.0–100.0)
Platelets: 220 10*3/uL (ref 150–440)
RBC: 2.63 MIL/uL — AB (ref 3.80–5.20)
RDW: 18.4 % — ABNORMAL HIGH (ref 11.5–14.5)
WBC: 7.3 10*3/uL (ref 3.6–11.0)

## 2017-05-28 LAB — URINALYSIS, COMPLETE (UACMP) WITH MICROSCOPIC
Bilirubin Urine: NEGATIVE
Glucose, UA: NEGATIVE mg/dL
Ketones, ur: NEGATIVE mg/dL
Nitrite: POSITIVE — AB
PH: 6 (ref 5.0–8.0)
Protein, ur: NEGATIVE mg/dL
SPECIFIC GRAVITY, URINE: 1.011 (ref 1.005–1.030)
SQUAMOUS EPITHELIAL / LPF: NONE SEEN (ref 0–5)

## 2017-05-28 LAB — CREATININE CLEARANCE, URINE, 24 HOUR
Collection Interval-CRCL: 24 hours
Creatinine Clearance: 60 mL/min — ABNORMAL LOW (ref 75–115)
Creatinine, 24H Ur: 1175 mg/d (ref 600–1800)
Creatinine, Urine: 87 mg/dL
Urine Total Volume-CRCL: 1350 mL

## 2017-05-28 LAB — BASIC METABOLIC PANEL
ANION GAP: 9 (ref 5–15)
BUN: 38 mg/dL — AB (ref 6–20)
CHLORIDE: 107 mmol/L (ref 101–111)
CO2: 24 mmol/L (ref 22–32)
Calcium: 10.1 mg/dL (ref 8.9–10.3)
Creatinine, Ser: 1.35 mg/dL — ABNORMAL HIGH (ref 0.44–1.00)
GFR calc Af Amer: 40 mL/min — ABNORMAL LOW (ref >60)
GFR, EST NON AFRICAN AMERICAN: 35 mL/min — AB (ref >60)
GLUCOSE: 110 mg/dL — AB (ref 65–99)
POTASSIUM: 3.7 mmol/L (ref 3.5–5.1)
Sodium: 140 mmol/L (ref 135–145)

## 2017-05-28 LAB — CALCITRIOL (1,25 DI-OH VIT D): Vit D, 1,25-Dihydroxy: 13.6 pg/mL — ABNORMAL LOW (ref 19.9–79.3)

## 2017-05-28 LAB — CALCIUM, IONIZED: Calcium, Ionized, Serum: 6.6 mg/dL — ABNORMAL HIGH (ref 4.5–5.6)

## 2017-05-28 LAB — BETA 2 MICROGLOBULIN, SERUM: BETA 2 MICROGLOBULIN: 4.9 mg/L — AB (ref 0.6–2.4)

## 2017-05-28 MED ORDER — CIPROFLOXACIN IN D5W 400 MG/200ML IV SOLN
400.0000 mg | INTRAVENOUS | Status: DC
  Administered 2017-05-28: 400 mg via INTRAVENOUS
  Filled 2017-05-28: qty 200

## 2017-05-28 MED ORDER — SODIUM CHLORIDE 0.9 % IV SOLN
INTRAVENOUS | Status: AC
  Administered 2017-05-28: 15:00:00 via INTRAVENOUS

## 2017-05-28 MED ORDER — AMLODIPINE BESYLATE 5 MG PO TABS
5.0000 mg | ORAL_TABLET | Freq: Every day | ORAL | Status: DC
  Administered 2017-05-29 – 2017-05-31 (×3): 5 mg via ORAL
  Filled 2017-05-28 (×4): qty 1

## 2017-05-28 NOTE — Progress Notes (Signed)
Clinical Social Worker (CSW) contacted patient's daughter [name] and presented bed offers. Daughter chose [facility]. CSW made daughter aware that patient has not been able to follow commands and participate in PT and [facility] will likely deny SNF. CSW discussed home health vs. Private pay for SNF options if Health Team denies SNF. Daughter verbalized her understanding. Per Health Team case manager she will review case tomorrow because MD is not discharging patient today. [facility] admissions coordinator at [facility] is aware of above.   [employer], LCSW [phone]

## 2017-05-28 NOTE — Progress Notes (Signed)
[place] for enoxaparin Indication: VTE prophylaxis  Allergies  Allergen Reactions  . Codeine Nausea And Vomiting  . Penicillins Swelling and Other (See Comments)    Lips swell Has patient had a PCN reaction causing immediate rash, facial/tongue/throat swelling, SOB or lightheadedness with hypotension: Yes Has patient had a PCN reaction causing severe rash involving mucus membranes or skin necrosis: No Has patient had a PCN reaction that required hospitalization: No Has patient had a PCN reaction occurring within the last 10 years: No If all of the above answers are "NO", then may proceed with Cephalosporin use.     Patient Measurements: Height: 5\' 6"  (167.6 cm) Weight: 132 lb (59.9 kg) IBW/kg (Calculated) : 59.3   Vital Signs: Temp: 98.2 F (36.8 C) (05/06 0814) Temp Source: Oral (05/06 0814) BP: 158/76 (05/06 0900) Pulse Rate: 84 (05/06 0900)  Labs: Recent Labs    05/25/17 1238  05/26/17 0419 05/27/17 0353 05/28/17 0411  HGB 9.7*   < > 8.5* 8.4* 8.5*  HCT 28.6*   < > 25.0* 24.7* 25.2*  PLT  --   --  225 195 220  APTT 37*  --   --   --   --   LABPROT 21.2*  --   --   --   --   INR 1.85  --   --   --   --   CREATININE  --   --  1.24* 1.49* 1.35*   < > = values in this interval not displayed.    Estimated Creatinine Clearance: 28.5 mL/min (A) (by C-G formula based on SCr of 1.35 mg/dL (H)).   Medical History: Past Medical History:  Diagnosis Date  . Arthritis    hands  . Claustrophobia   . Depression    with anxiety/h/o agoraphobia  . Glaucoma   . HOH (hard of hearing)   . Hyperlipidemia   . Hypertension   . PONV (postoperative nausea and vomiting)     Medications:  No PTA anticoag   Assessment: 84 yo female presents with hip fracture s/p surgery. Pharmacy has been consulted for lovenox dosing for VTE prophylaxis.    Plan:  MD started lovenox 30 mg SQ Q24H to start 5/4. Will continue at this dose as CrCl <30  ml/min at this time.   Pharmacy will continue to follow.   Rayna Sexton, PharmD, BCPS Clinical Pharmacist 05/28/2017 10:43 AM

## 2017-05-28 NOTE — Progress Notes (Signed)
Physical Therapy Treatment Patient Details Name: Theresa Lynch MRN: [id number] DOB: [birth year] Today's Date: 05/28/2017    History of Present Illness Patient is an 84 y/o female that presents s/p fall and L hip fracture, IM nailing performed to repair hip. Of note she was noted to have abnormalities on CT scan, concern is for multiple myeloma and is being worked up by oncology team.     PT Comments    Pt extremely lethargic, but able to slightly awaken with heavy verbal/tactile stimuli. Needs continuous awakening throughout session. Pt demonstrate some limited active assisted range of motion on the L with a definite good effort to attempt movement with heavy cues; pt manages 2-5 repetitions each exercise.. Daughter in law (who lives with pt) helpful in gaining pt's attention. Continue PT to progress participation, range, strength and endurance to improve all functional mobility.    Follow Up Recommendations  SNF     Equipment Recommendations       Recommendations for Other Services       Precautions / Restrictions Precautions Precautions: Fall Restrictions Weight Bearing Restrictions: Yes LLE Weight Bearing: Weight bearing as tolerated    Mobility  Bed Mobility               General bed mobility comments: Not tested due to lethargy/unable to follow commands well  Transfers                    Ambulation/Gait                 Stairs             Wheelchair Mobility    Modified Rankin (Stroke Patients Only)       Balance                                            Cognition Arousal/Alertness: Lethargic;Suspect due to medications Behavior During Therapy: Flat affect Overall Cognitive Status: Impaired/Different from baseline Area of Impairment: Following commands;Orientation;Attention                 Orientation Level: Disoriented to;Place;Time;Situation Current Attention Level: (very limited)   Following  Commands: Follows one step commands inconsistently       General Comments: Due to lethargy, very difficult to remain awake. Heavy verbal/tactile cueing and encouragement along with voice/tapping to awaken      Exercises Other Exercises Other Exercises: Pt demonstrates some active movement on RLE without commands. Able to demonstrate 2 to 5 reps with increased time and cues for L ankle DF/PF, SAQ, heel slide, hip abd/add and SLR; all with partial range.     General Comments        Pertinent Vitals/Pain Pain Assessment: Faces Faces Pain Scale: Hurts little more(With movement) Pain Location: L hip     Home Living                      Prior Function            PT Goals (current goals can now be found in the care plan section) Progress towards PT goals: Not progressing toward goals - comment(progressing slowly with following commands/LLE)    Frequency    BID      PT Plan Current plan remains appropriate    Co-evaluation  AM-PAC PT "6 Clicks" Daily Activity  Outcome Measure  Difficulty turning over in bed (including adjusting bedclothes, sheets and blankets)?: Unable Difficulty moving from lying on back to sitting on the side of the bed? : Unable Difficulty sitting down on and standing up from a chair with arms (e.g., wheelchair, bedside commode, etc,.)?: Unable Help needed moving to and from a bed to chair (including a wheelchair)?: Total Help needed walking in hospital room?: Total Help needed climbing 3-5 steps with a railing? : Total 6 Click Score: 6    End of Session   Activity Tolerance: Patient limited by lethargy Patient left: in bed;with call bell/phone within reach;with bed alarm set;with family/visitor present;with SCD's reapplied   PT Visit Diagnosis: Unsteadiness on feet (R26.81);Muscle weakness (generalized) (M62.81);History of falling (Z91.81)     Time: 1771-1657 PT Time Calculation (min) (ACUTE ONLY): 20  min  Charges:  $Therapeutic Exercise: 8-22 mins                    G Codes:        Larae Grooms, PTA 05/28/2017, 11:50 AM

## 2017-05-28 NOTE — Progress Notes (Signed)
Theresa Lynch   DOB:06-22-1929   QH#:[id number]    Subjective: Patient is confused/drowsy but arousable.  She is alone.  Denies pain; however incoherent.  Patient received Zometa yesterday.  ROS: Difficult to assess given patient's mentation  Objective:  Vitals:   05/28/17 0900 05/28/17 1638  BP: (!) 158/76 (!) 192/76  Pulse: 84 81  Resp:  18  Temp:  98.2 F (36.8 C)  SpO2: 97% 96%     Intake/Output Summary (Last 24 hours) at 05/28/2017 1641 Last data filed at 05/28/2017 1613 Gross per 24 hour  Intake 264 ml  Output 1250 ml  Net -986 ml    GENERAL: Moderately built moderately nourished female patient drowsy but confused.  No distress and comfortable.     She is alone.  EYES: Positive for pallor. OROPHARYNX: no thrush or ulceration. NECK: supple, no masses felt LYMPH:  no palpable lymphadenopathy in the cervical, axillary or inguinal regions LUNGS: decreased breath sounds to auscultation at bases and  No wheeze or crackles HEART/CVS: regular rate & rhythm and no murmurs; No lower extremity edema ABDOMEN: abdomen soft, non-tender and normal bowel sounds Musculoskeletal:no cyanosis of digits and no clubbing  PSYCH: drowsy/ arousable; Oriented x0.  NEURO: no focal motor/sensory deficits SKIN:  no rashes or significant lesions    Labs:  Lab Results  Component Value Date   WBC 7.3 05/28/2017   HGB 8.5 (L) 05/28/2017   HCT 25.2 (L) 05/28/2017   MCV 96.1 05/28/2017   PLT 220 05/28/2017   NEUTROABS 3.7 05/24/2017    Lab Results  Component Value Date   NA 140 05/28/2017   K 3.7 05/28/2017   CL 107 05/28/2017   CO2 24 05/28/2017    Studies:  No results found.  Assessment & Plan:   #84 year old lady with a history of mild to moderate dementia admitted for left hip fracture-also very suspicious for multiple myeloma  #High suspicion for multiple myeloma-with multiple calvarial lesions chronic anemia mild hypercalcemia renal insufficiency creatinine 1.4 and elevated  protein.  Myeloma work-up-abnormal kappa/lambda light chain ratio; awaiting on M protein work-up.  Hold use of steroids given patient's mental status changes; high likelihood that steroids might worsen her mental status changes.  #Hypercalcemia-calcium 10.7 low albumin; status post Zometa 3 mg.  Improving.  # Mild to moderate dementia/currently delirious post surgery-.  #Left hip fracture-question pathologic versus status post fall.  Await biopsy results-if sent.  Will check with pathology.  Cammie Sickle, MD 05/28/2017  4:41 PM

## 2017-05-28 NOTE — Progress Notes (Addendum)
[place] at [place] NAME: Theresa Lynch    MR#:  [id number]  DATE OF BIRTH:  [birth year]  SUBJECTIVE:  CHIEF COMPLAINT:   Chief Complaint  Patient presents with  . Fall  . Hip Pain   -Postop day 3 after left hip fracture repair -More alert today but remains confused.  Occasional restlessness noted.  Did not receive any narcotics or benzos this morning  REVIEW OF SYSTEMS:  Review of Systems  Constitutional: Positive for malaise/fatigue. Negative for chills and fever.  HENT: Positive for hearing loss. Negative for congestion, ear discharge and nosebleeds.   Eyes: Negative for blurred vision and double vision.  Respiratory: Negative for cough, shortness of breath and wheezing.   Cardiovascular: Negative for chest pain, palpitations and leg swelling.  Gastrointestinal: Negative for abdominal pain, constipation, diarrhea, nausea and vomiting.  Genitourinary: Negative for dysuria.  Musculoskeletal: Positive for joint pain and myalgias.  Neurological: Negative for dizziness, focal weakness, seizures, weakness and headaches.  Psychiatric/Behavioral: Negative for depression.    DRUG ALLERGIES:   Allergies  Allergen Reactions  . Codeine Nausea And Vomiting  . Penicillins Swelling and Other (See Comments)    Lips swell Has patient had a PCN reaction causing immediate rash, facial/tongue/throat swelling, SOB or lightheadedness with hypotension: Yes Has patient had a PCN reaction causing severe rash involving mucus membranes or skin necrosis: No Has patient had a PCN reaction that required hospitalization: No Has patient had a PCN reaction occurring within the last 10 years: No If all of the above answers are "NO", then may proceed with Cephalosporin use.     VITALS:  Blood pressure (!) 158/76, pulse 84, temperature 98.2 F (36.8 C), temperature source Oral, resp. rate 18, height '5\' 6"'  (1.676 m), weight 59.9 kg (132 lb), SpO2 97  %.  PHYSICAL EXAMINATION:  Physical Exam  GENERAL:  84 y.o.-year-old elderly patient lying in the bed with no acute distress.  EYES: Pupils equal, round, reactive to light and accommodation. No scleral icterus. Extraocular muscles intact.  HEENT: Head atraumatic, normocephalic. Oropharynx and nasopharynx clear.  NECK:  Supple, no jugular venous distention. No thyroid enlargement, no tenderness.  LUNGS: Normal breath sounds bilaterally, no wheezing, rales,rhonchi or crepitation. No use of accessory muscles of respiration. Decreased bibasilar breath sounds CARDIOVASCULAR: S1, S2 normal. No  rubs, or gallops. 3/6 systolic murmur present ABDOMEN: Soft, nontender, nondistended. Bowel sounds present. No organomegaly or mass.  EXTREMITIES: left thigh dressing in place. No pedal edema, cyanosis, or clubbing.  NEUROLOGIC: Cranial nerves II through XII are intact. Muscle strength 5/5 in all extremities except left leg due to pain from recent surgery. Sensation intact. Gait not checked. Global weakness PSYCHIATRIC: The patient is sleepy, but arousable and following commands.  Oriented to self only SKIN: No obvious rash, lesion, or ulcer.    LABORATORY PANEL:   CBC Recent Labs  Lab 05/28/17 0411  WBC 7.3  HGB 8.5*  HCT 25.2*  PLT 220   ------------------------------------------------------------------------------------------------------------------  Chemistries  Recent Labs  Lab 05/26/17 0419  05/28/17 0411  NA 138   < > 140  K 4.8   < > 3.7  CL 104   < > 107  CO2 23   < > 24  GLUCOSE 115*   < > 110*  BUN 31*   < > 38*  CREATININE 1.24*   < > 1.35*  CALCIUM 10.4*  10.1   < > 10.1  AST 53*  --   --  ALT 26  --   --   ALKPHOS 75  --   --   BILITOT 2.1*  --   --    < > = values in this interval not displayed.   ------------------------------------------------------------------------------------------------------------------  Cardiac Enzymes No results for input(s): TROPONINI  in the last 168 hours. ------------------------------------------------------------------------------------------------------------------  RADIOLOGY:  No results found.  EKG:   Orders placed or performed during the hospital encounter of 05/24/17  . ED EKG  . ED EKG  . EKG 12-Lead  . EKG 12-Lead    ASSESSMENT AND PLAN:   Theresa Segoviano Dodsonis a 84 y.o.femalewith a history of hypertension as well as intracranial hemorrhage not on any blood thinners at this time who is presenting after losing her balance walking with her cane and falling on her left hip.     1.  Left intertrochanteric fracture with associated superior and inferior pelvic fractures. - -Appreciate orthopedic consult.  Patient is status post intramedullary fixation of left intertrochanteric hip fracture.  Postop day 3 today -Hemoglobin is stable after transfusion prior to surgery. -Delirium noted, discontinued IV narcotics.  on tramadol and muscle relaxants for pain -Continue physical therapy  2. Incidental finding of innumerable skull Lytic lesions:  Concern for multiple myeloma especially given her renal failure, hypercalcemia and anemia -Appreciate oncology consult -Related lab work ordered - on a 24hr urine collection -Received 1 dose of Zometa for hypercalcemia yesterday  3. Anemia: Likely anemia of chronic disease.  Work-up for myeloma being done -Received 2 units transfusion prior to surgery and hemoglobin at 8.5.  No indication for transfusion unless less than 7. -Continue to monitor -On iron supplements  4. CKD: Stage III; avoid nephrotoxic agents.  5. Hypertension: Continue metoprolol  6.  Acute delirium-discontinued morphine.  Try to avoid Ativan if possible. -For agitation, give Haldol. -Tylenol and tramadol only for pain as needed. - Patient takes Paxil at home which we will continue at this time -Avoid benzodiazepines if possible. -If no improvement, will get MRI of the brain to  rule out metastatic disease -Urine analysis with minimal bacteria noted.  Urine culture still pending will start on Cipro  7. DVT prophylaxis: Lovenox started after surgery   Will need rehab at discharge   All the records are reviewed and case discussed with Care Management/Social Workerr. Management plans discussed with the patient, family and they are in agreement.  CODE STATUS: DNR  TOTAL TIME TAKING CARE OF THIS PATIENT: 38 minutes.   POSSIBLE D/C IN 2-3  DAYS, DEPENDING ON CLINICAL CONDITION.   Gladstone Lighter M.D on 05/28/2017 at 3:06 PM  Between 7am to 6pm - Pager - [phone]  After 6pm go to [email or web address] - password [email or web address] [facility]  Office  [phone]  CC: Primary care physician; Theresa Body, MD

## 2017-05-28 NOTE — Progress Notes (Signed)
Physical Therapy Treatment Patient Details Name: Theresa Lynch MRN: [id number] DOB: 1929-04-18 Today's Date: 05/28/2017    History of Present Illness Patient is an 84 y/o female that presents s/p fall and L hip fracture, IM nailing performed to repair hip. Of note she was noted to have abnormalities on CT scan, concern is for multiple myeloma and is being worked up by oncology team.     PT Comments    Pt participates less with exercises this session due to increased restlessness and agitation. Pt will follow 1-2 repetitions of LLE exercises before stating "no ma'am". Pt demonstrating kicking/retracting of RLE and wiggling in bed to the R creating undesirable position. Pt repositioned with trunk and LEs more elevated with less movement. Continue PT as appropriate to improve range, endurance and strength to improve functional mobility.    Follow Up Recommendations  SNF     Equipment Recommendations       Recommendations for Other Services       Precautions / Restrictions Precautions Precautions: Fall Restrictions Weight Bearing Restrictions: Yes LLE Weight Bearing: Weight bearing as tolerated    Mobility  Bed Mobility               General bed mobility comments: Not tested due to level of agitation  Transfers                    Ambulation/Gait                 Stairs             Wheelchair Mobility    Modified Rankin (Stroke Patients Only)       Balance                                            Cognition Arousal/Alertness: Lethargic;Suspect due to medications Behavior During Therapy: Agitated;Restless Overall Cognitive Status: Impaired/Different from baseline Area of Impairment: Following commands;Orientation;Attention                 Orientation Level: Disoriented to;Place;Time;Situation Current Attention Level: (very little attention)   Following Commands: (does not follow commands this session)        General Comments: very restless and agitated. Often says "no ma'am" when attempting to instruct pt in exercise.      Exercises Other Exercises Other Exercises: Pt demonstrates less following instruction for exercise. Pt very restless. Follows some for 1-2 repetitions only    General Comments        Pertinent Vitals/Pain Pain Assessment: Faces Faces Pain Scale: Hurts even more(With movement and answers yes; greater face grimacing) Pain Location: L hip     Home Living                      Prior Function            PT Goals (current goals can now be found in the care plan section) Progress towards PT goals: Not progressing toward goals - comment    Frequency    BID      PT Plan Current plan remains appropriate    Co-evaluation              AM-PAC PT "6 Clicks" Daily Activity  Outcome Measure  Difficulty turning over in bed (including adjusting bedclothes, sheets and blankets)?: Unable Difficulty moving from lying on back  to sitting on the side of the bed? : Unable Difficulty sitting down on and standing up from a chair with arms (e.g., wheelchair, bedside commode, etc,.)?: Unable Help needed moving to and from a bed to chair (including a wheelchair)?: Total Help needed walking in hospital room?: Total Help needed climbing 3-5 steps with a railing? : Total 6 Click Score: 6    End of Session   Activity Tolerance: Treatment limited secondary to agitation Patient left: in bed;with call bell/phone within reach;with bed alarm set;with family/visitor present(kicking foot pumps off)   PT Visit Diagnosis: Unsteadiness on feet (R26.81);Muscle weakness (generalized) (M62.81);History of falling (Z91.81)     Time: 8063-8685 PT Time Calculation (min) (ACUTE ONLY): 16 min  Charges:  $Therapeutic Exercise: 8-22 mins                    G Codes:        Larae Grooms, PTA 05/28/2017, 4:58 PM

## 2017-05-28 NOTE — Progress Notes (Signed)
PHARMACY NOTE:  ANTIMICROBIAL RENAL DOSAGE ADJUSTMENT  Current antimicrobial regimen includes a mismatch between antimicrobial dosage and estimated renal function.  As per policy approved by the Pharmacy & Therapeutics and Medical Executive Committees, the antimicrobial dosage will be adjusted accordingly.  Current antimicrobial dosage:  cipro 200mg  BID  Indication: UTI  Renal Function:  Estimated Creatinine Clearance: 28.5 mL/min (A) (by C-G formula based on SCr of 1.35 mg/dL (H)). []      On intermittent HD, scheduled: []      On CRRT    Antimicrobial dosage has been changed to:  cipro IV 400mg  q 24hr  Additional comments:   Thank you for allowing pharmacy to be a part of this patient's care.  Ramond Dial, [facility] 05/28/2017 3:12 PM

## 2017-05-28 NOTE — Progress Notes (Signed)
  Subjective:  POD #3 s/p IM fixation left intertrochanteric hip fracture.  Patient is resting comfortably.  She is confused and unable to provide a history.    Objective:   VITALS:   Vitals:   05/27/17 1701 05/28/17 0029 05/28/17 0814 05/28/17 0900  BP: (!) 176/92 (!) 197/98 (!) 142/126 (!) 158/76  Pulse: 78 82 90 84  Resp:  20 18   Temp: (!) 97.5 F (36.4 C) 97.9 F (36.6 C) 98.2 F (36.8 C)   TempSrc:  Oral Oral   SpO2: 98% 99% 91% 97%  Weight:      Height:        PHYSICAL EXAM: Left lower extremity: Unable to assess sensation as patient unable to follow commands. Intact pulses distally Spontaneous Dorsiflexion/Plantar flexion intact Incision: moderate drainage on initial dressing. No cellulitis present Compartment soft  LABS  Results for orders placed or performed during the hospital encounter of 05/24/17 (from the past 24 hour(s))  CBC     Status: Abnormal   Collection Time: 05/28/17  4:11 AM  Result Value Ref Range   WBC 7.3 3.6 - 11.0 K/uL   RBC 2.63 (L) 3.80 - 5.20 MIL/uL   Hemoglobin 8.5 (L) 12.0 - 16.0 g/dL   HCT 25.2 (L) 35.0 - 47.0 %   MCV 96.1 80.0 - 100.0 fL   MCH 32.4 26.0 - 34.0 pg   MCHC 33.8 32.0 - 36.0 g/dL   RDW 18.4 (H) 11.5 - 14.5 %   Platelets 220 150 - 440 K/uL  Basic metabolic panel     Status: Abnormal   Collection Time: 05/28/17  4:11 AM  Result Value Ref Range   Sodium 140 135 - 145 mmol/L   Potassium 3.7 3.5 - 5.1 mmol/L   Chloride 107 101 - 111 mmol/L   CO2 24 22 - 32 mmol/L   Glucose, Bld 110 (H) 65 - 99 mg/dL   BUN 38 (H) 6 - 20 mg/dL   Creatinine, Ser 1.35 (H) 0.44 - 1.00 mg/dL   Calcium 10.1 8.9 - 10.3 mg/dL   GFR calc non Af Amer 35 (L) >60 mL/min   GFR calc Af Amer 40 (L) >60 mL/min   Anion gap 9 5 - 15  Creatinine clearance, urine, 24 hour     Status: Abnormal   Collection Time: 05/28/17  9:00 AM  Result Value Ref Range   Urine Total Volume-CRCL 1,350 mL   Collection Interval-CRCL 24 hours   Creatinine, Urine 87  mg/dL   Creatinine, 24H Ur 1,175 600 - 1,800 mg/day   Creatinine Clearance 60 (L) 75 - 115 mL/min    No results found.  Assessment/Plan: 3 Days Post-Op   Active Problems:   Hip fracture ([place])   Patient having postop confusion.  Continue physical therapy as she can tolerate.  Dressing will be changed by nursing staff.  Appreciate hospitalist and oncology following.   Thornton Park , MD 05/28/2017, 1:54 PM

## 2017-05-28 NOTE — Care Management Important Message (Signed)
Copy of signed IM left in patient's room.    

## 2017-05-29 LAB — BASIC METABOLIC PANEL
Anion gap: 12 (ref 5–15)
BUN: 28 mg/dL — ABNORMAL HIGH (ref 6–20)
CALCIUM: 8.7 mg/dL — AB (ref 8.9–10.3)
CO2: 21 mmol/L — AB (ref 22–32)
Chloride: 107 mmol/L (ref 101–111)
Creatinine, Ser: 1.04 mg/dL — ABNORMAL HIGH (ref 0.44–1.00)
GFR calc non Af Amer: 48 mL/min — ABNORMAL LOW (ref >60)
GFR, EST AFRICAN AMERICAN: 55 mL/min — AB (ref >60)
Glucose, Bld: 111 mg/dL — ABNORMAL HIGH (ref 65–99)
Potassium: 3 mmol/L — ABNORMAL LOW (ref 3.5–5.1)
SODIUM: 140 mmol/L (ref 135–145)

## 2017-05-29 LAB — MULTIPLE MYELOMA PANEL, SERUM
ALBUMIN/GLOB SERPL: 0.6 — AB (ref 0.7–1.7)
ALPHA 1: 0.3 g/dL (ref 0.0–0.4)
Albumin SerPl Elph-Mcnc: 2.9 g/dL (ref 2.9–4.4)
Alpha2 Glob SerPl Elph-Mcnc: 0.5 g/dL (ref 0.4–1.0)
B-Globulin SerPl Elph-Mcnc: 4.4 g/dL — ABNORMAL HIGH (ref 0.7–1.3)
GLOBULIN, TOTAL: 5.5 g/dL — AB (ref 2.2–3.9)
Gamma Glob SerPl Elph-Mcnc: 0.4 g/dL (ref 0.4–1.8)
IgA: 5027 mg/dL — ABNORMAL HIGH (ref 64–422)
IgG (Immunoglobin G), Serum: 149 mg/dL — ABNORMAL LOW (ref 700–1600)
IgM (Immunoglobulin M), Srm: <5 mg/dL — ABNORMAL LOW (ref 26–217)
M PROTEIN SERPL ELPH-MCNC: 3.4 g/dL — AB
Total Protein ELP: 8.4 g/dL (ref 6.0–8.5)

## 2017-05-29 LAB — UPEP/TP, 24-HR URINE
ALBUMIN, U: 19.8 %
Alpha 1, Urine: 1 %
Alpha 2, Urine: 6.5 %
BETA UR: 17.1 %
GAMMA UR: 55.7 %
M-Spike, mg/24 hr: 501.3 mg/24 hr — ABNORMAL HIGH
M-spike, %: 46.3 % — ABNORMAL HIGH
TOTAL PROTEIN, URINE-UPE24: 80.2 mg/dL
Total Protein, Urine-Ur/day: 1083 mg/24 hr — ABNORMAL HIGH (ref 30–150)
Total Volume: 1350

## 2017-05-29 LAB — MAGNESIUM: MAGNESIUM: 1.4 mg/dL — AB (ref 1.7–2.4)

## 2017-05-29 MED ORDER — ENOXAPARIN SODIUM 40 MG/0.4ML ~~LOC~~ SOLN: 40.0000 mg | SUBCUTANEOUS | Status: DC

## 2017-05-29 MED ORDER — POTASSIUM CHLORIDE CRYS ER 20 MEQ PO TBCR: 40.0000 meq | EXTENDED_RELEASE_TABLET | ORAL | Status: DC

## 2017-05-29 MED ORDER — MAGNESIUM SULFATE 2 GM/50ML IV SOLN
2.0000 g | Freq: Once | INTRAVENOUS | Status: AC
  Administered 2017-05-29: 2 g via INTRAVENOUS
  Filled 2017-05-29: qty 50

## 2017-05-29 MED ORDER — POTASSIUM CHLORIDE 20 MEQ PO PACK
40.0000 meq | PACK | Freq: Once | ORAL | Status: AC
Start: 2017-05-29 — End: 2017-05-29
  Administered 2017-05-29: 40 meq via ORAL
  Filled 2017-05-29: qty 2

## 2017-05-29 MED ORDER — POTASSIUM CHLORIDE 10 MEQ/100ML IV SOLN
10.0000 meq | INTRAVENOUS | Status: AC
  Administered 2017-05-29 (×4): 10 meq via INTRAVENOUS
  Filled 2017-05-29 (×4): qty 100

## 2017-05-29 MED ORDER — ENOXAPARIN SODIUM 30 MG/0.3ML ~~LOC~~ SOLN
30.0000 mg | SUBCUTANEOUS | Status: DC
  Administered 2017-05-30: 30 mg via SUBCUTANEOUS
  Filled 2017-05-29: qty 0.3

## 2017-05-29 MED ORDER — CIPROFLOXACIN IN D5W 400 MG/200ML IV SOLN
400.0000 mg | Freq: Two times a day (BID) | INTRAVENOUS | Status: DC
  Administered 2017-05-29 – 2017-05-31 (×5): 400 mg via INTRAVENOUS
  Filled 2017-05-29 (×6): qty 200

## 2017-05-29 MED ORDER — POLYETHYLENE GLYCOL 3350 17 G PO PACK
17.0000 g | PACK | Freq: Every day | ORAL | Status: DC
  Administered 2017-05-29 – 2017-05-30 (×2): 17 g via ORAL
  Filled 2017-05-29 (×3): qty 1

## 2017-05-29 NOTE — Progress Notes (Signed)
Physical Therapy Treatment Patient Details Name: Theresa Lynch MRN: [id number] DOB: 1929-08-24 Today's Date: 05/29/2017    History of Present Illness Patient is an 84 y/o female that presents s/p fall and L hip fracture, IM nailing performed to repair hip. Of note she was noted to have abnormalities on CT scan, concern is for multiple myeloma and is being worked up by oncology team.     PT Comments    Pt in recliner, ready to return to bed.  Daughter in law called stating she was getting restless in chair.  Assisted pt back to bed with max a x 2.  While pt attempted to assist with transfer, she had difficulty moving feet and assistance was provided.    Overall tolerated session well today with increased participation and ability to follow verbal cues.     Follow Up Recommendations  SNF     Equipment Recommendations       Recommendations for Other Services       Precautions / Restrictions Precautions Precautions: Fall Restrictions Weight Bearing Restrictions: Yes LLE Weight Bearing: Weight bearing as tolerated    Mobility  Bed Mobility Overal bed mobility: Needs Assistance Bed Mobility: Sit to Supine     Supine to sit: Mod assist;+2 for physical assistance Sit to supine: Max assist;+2 for safety/equipment   General bed mobility comments: voiced being ready to get out of bed and attempting on her own  Transfers Overall transfer level: Needs assistance Equipment used: None Transfers: Sit to/from Stand Sit to Stand: Max assist;+2 safety/equipment;+2 physical assistance Stand pivot transfers: +2 physical assistance;+2 safety/equipment;Max assist       General transfer comment: Pt with difficulty moving feet needing verbal and tactile cues to turn  Ambulation/Gait             General Gait Details: unable   Stairs             Wheelchair Mobility    Modified Rankin (Stroke Patients Only)       Balance Overall balance assessment: Needs  assistance Sitting-balance support: Bilateral upper extremity supported;Feet supported Sitting balance-Leahy Scale: Poor Sitting balance - Comments: assist to prevent fall. Postural control: Posterior lean;Right lateral lean Standing balance support: Bilateral upper extremity supported Standing balance-Leahy Scale: Poor Standing balance comment: while she was able to stand with assist, she required +2 and is unsafe to be left unattended.                            Cognition Arousal/Alertness: Awake/alert Behavior During Therapy: WFL for tasks assessed/performed Overall Cognitive Status: Impaired/Different from baseline                                 General Comments: improved participation this session.  Did not resist exercises and assisted with transfer.      Exercises General Exercises - Lower Extremity Ankle Circles/Pumps: AAROM;10 reps;Both Long Arc Quad: AAROM;Both;10 reps Heel Slides: AAROM;Both;10 reps Hip ABduction/ADduction: AAROM;Both;10 reps;Supine Straight Leg Raises: AAROM;Both;10 reps Hip Flexion/Marching: AROM;AAROM;10 reps;Both    General Comments        Pertinent Vitals/Pain Pain Assessment: No/denies pain Pain Location: appears generally comfortable during session. Pain Intervention(s): Monitored during session;Repositioned    Home Living                      Prior Function  PT Goals (current goals can now be found in the care plan section) Progress towards PT goals: Progressing toward goals    Frequency    BID      PT Plan Current plan remains appropriate    Co-evaluation              AM-PAC PT "6 Clicks" Daily Activity  Outcome Measure  Difficulty turning over in bed (including adjusting bedclothes, sheets and blankets)?: Unable Difficulty moving from lying on back to sitting on the side of the bed? : Unable Difficulty sitting down on and standing up from a chair with arms (e.g.,  wheelchair, bedside commode, etc,.)?: Unable Help needed moving to and from a bed to chair (including a wheelchair)?: Total Help needed walking in hospital room?: Total Help needed climbing 3-5 steps with a railing? : Total 6 Click Score: 6    End of Session Equipment Utilized During Treatment: Gait belt Activity Tolerance: Patient tolerated treatment well Patient left: in bed;with bed alarm set;with call bell/phone within reach;with family/visitor present Nurse Communication: Mobility status       Time: 1353-1404 PT Time Calculation (min) (ACUTE ONLY): 11 min  Charges:  $Therapeutic Exercise: 8-22 mins $Therapeutic Activity: 8-22 mins                    G Codes:       Jerris Keltz, PTA 05/29/17, 2:25 PM

## 2017-05-29 NOTE — Progress Notes (Signed)
Per Attending MD she spoke to [facility] Team medical director today and it was decided that he will follow patient's progress with PT and OT tomorrow and make a final determination about SNF. Clinical Social Worker (CSW) contacted patient's daughter [name] and made her aware of above. [facility] admissions coordinator at [facility] is aware of above.   [employer], LCSW [phone]

## 2017-05-29 NOTE — Progress Notes (Signed)
[place] for enoxaparin Indication: VTE prophylaxis  Allergies  Allergen Reactions  . Codeine Nausea And Vomiting  . Penicillins Swelling and Other (See Comments)    Lips swell Has patient had a PCN reaction causing immediate rash, facial/tongue/throat swelling, SOB or lightheadedness with hypotension: Yes Has patient had a PCN reaction causing severe rash involving mucus membranes or skin necrosis: No Has patient had a PCN reaction that required hospitalization: No Has patient had a PCN reaction occurring within the last 10 years: No If all of the above answers are "NO", then may proceed with Cephalosporin use.     Patient Measurements: Height: 5\' 6"  (167.6 cm) Weight: 132 lb (59.9 kg) IBW/kg (Calculated) : 59.3   Vital Signs: Temp: 97.8 F (36.6 C) (05/07 0800) Temp Source: Oral (05/07 0800) BP: 126/85 (05/07 1300) Pulse Rate: 85 (05/07 0800)  Labs: Recent Labs    05/27/17 0353 05/28/17 0411 05/29/17 0513  HGB 8.4* 8.5*  --   HCT 24.7* 25.2*  --   PLT 195 220  --   CREATININE 1.49* 1.35* 1.04*    Estimated Creatinine Clearance: 37 mL/min (A) (by C-G formula based on SCr of 1.04 mg/dL (H)).   Medical History: Past Medical History:  Diagnosis Date  . Arthritis    hands  . Claustrophobia   . Depression    with anxiety/h/o agoraphobia  . Glaucoma   . HOH (hard of hearing)   . Hyperlipidemia   . Hypertension   . PONV (postoperative nausea and vomiting)     Medications:  No PTA anticoag   Assessment: 84 yo female presents with hip fracture s/p surgery. Pharmacy has been consulted for lovenox dosing for VTE prophylaxis. SCr improved. CrCl now > 30 ml/min.    Plan:  Pt currently ordered lovenox 30 mg SQ Q24H. CrCl now >30 ml/min. Will adjust to 40 mg Sq q24h. Hgb and plt count have been stable post-op.   Pharmacy will continue to follow.   Rayna Sexton, PharmD, BCPS Clinical Pharmacist   Addendum Per  discussion with hospitalist, changing dose back to enoxaparin 30 mg SQ q24h.  As per ortho note: Patient with drainage on her bandage; MD checking  INR/PTT tomorrow.  If pt with elevated INR and/or PTT then lovenox may be stopped to slow drainage from incision.  Rayna Sexton, PharmD, BCPS Clinical Pharmacist 05/29/2017 2:46 PM

## 2017-05-29 NOTE — Progress Notes (Signed)
Health Team medical director denied SNF and offered a peer to peer. Attending MD has agreed to complete peer to peer and will contact the Health Team medical director. Clinical Social Worker (CSW) contacted patient's daughter [name] and made her aware of above.   [employer], LCSW [phone]

## 2017-05-29 NOTE — Progress Notes (Signed)
Physical Therapy Treatment Patient Details Name: Theresa Lynch MRN: [id number] DOB: November 22, 1929 Today's Date: 05/29/2017    History of Present Illness Patient is an 84 y/o female that presents s/p fall and L hip fracture, IM nailing performed to repair hip. Of note she was noted to have abnormalities on CT scan, concern is for multiple myeloma and is being worked up by oncology team.     PT Comments    Participated in exercises as described below.  Pt assisted with exercises this session and seemed generally comfortable.  Pt attempted to get out of bed this session.  Pt was assisted to edge of bed with mod a x 2.  While she put in effort to assist, she required help.  Once sitting, required min assist to maintain balance.  Stood with walker and mod a x 2.  She needed assist and verbal cues for hand placement and for proper foot placement on floor.  While standing, she was encouraged to try stepping but was unable to move her feet and self limited weight bearing on LLE.  Pt sat, then assisted with stand pivot to recliner at bedside with mod a x 2.  Remained up in recliner after session.   Follow Up Recommendations  SNF     Equipment Recommendations       Recommendations for Other Services       Precautions / Restrictions Precautions Precautions: Fall Restrictions Weight Bearing Restrictions: Yes LLE Weight Bearing: Weight bearing as tolerated    Mobility  Bed Mobility Overal bed mobility: Needs Assistance Bed Mobility: Supine to Sit     Supine to sit: Mod assist;+2 for physical assistance     General bed mobility comments: voiced being ready to get out of bed and attempting on her own  Transfers Overall transfer level: Needs assistance Equipment used: Rolling walker (2 wheeled);None Transfers: Sit to/from Stand Sit to Stand: Mod assist;+2 physical assistance Stand pivot transfers: Mod assist;+2 physical assistance       General transfer comment: stood with walker  but was unable to move feet with walker, returned to sitting and stand pivot to chair performed  Ambulation/Gait             General Gait Details: unable   Stairs             Wheelchair Mobility    Modified Rankin (Stroke Patients Only)       Balance Overall balance assessment: Needs assistance Sitting-balance support: Bilateral upper extremity supported;Feet supported Sitting balance-Leahy Scale: Poor Sitting balance - Comments: assist to prevent fall. Postural control: Posterior lean;Right lateral lean Standing balance support: Bilateral upper extremity supported Standing balance-Leahy Scale: Poor Standing balance comment: while she was able to stand with assist, she required +2 and is unsafe to be left unattended.                            Cognition Arousal/Alertness: Awake/alert Behavior During Therapy: WFL for tasks assessed/performed Overall Cognitive Status: Impaired/Different from baseline                                 General Comments: improved participation this session.  Did not resist exercises and assisted with transfer.      Exercises General Exercises - Lower Extremity Ankle Circles/Pumps: AAROM;10 reps;Both Long Arc Quad: AAROM;Both;10 reps Heel Slides: AAROM;Both;10 reps Hip ABduction/ADduction: AAROM;Both;10 reps;Supine Straight Leg Raises:  AAROM;Both;10 reps Hip Flexion/Marching: AROM;AAROM;10 reps;Both    General Comments        Pertinent Vitals/Pain Pain Assessment: No/denies pain Pain Location: appears generally comfortable during session. Pain Intervention(s): Monitored during session    Home Living                      Prior Function            PT Goals (current goals can now be found in the care plan section) Progress towards PT goals: Progressing toward goals    Frequency    BID      PT Plan Current plan remains appropriate    Co-evaluation              AM-PAC PT  "6 Clicks" Daily Activity  Outcome Measure  Difficulty turning over in bed (including adjusting bedclothes, sheets and blankets)?: Unable Difficulty moving from lying on back to sitting on the side of the bed? : Unable Difficulty sitting down on and standing up from a chair with arms (e.g., wheelchair, bedside commode, etc,.)?: Unable Help needed moving to and from a bed to chair (including a wheelchair)?: Total Help needed walking in hospital room?: Total Help needed climbing 3-5 steps with a railing? : Total 6 Click Score: 6    End of Session Equipment Utilized During Treatment: Gait belt Activity Tolerance: Patient tolerated treatment well Patient left: in chair;with chair alarm set;with call bell/phone within reach;with family/visitor present Nurse Communication: Mobility status       Time: 3419-3790 PT Time Calculation (min) (ACUTE ONLY): 24 min  Charges:  $Therapeutic Exercise: 8-22 mins $Therapeutic Activity: 8-22 mins                    G Codes:       Leydi Winstead, PTA 05/29/17, 1:09 PM

## 2017-05-29 NOTE — Progress Notes (Signed)
Theresa Lynch at [place] NAME: Theresa Lynch    MR#:  [id number]  DATE OF BIRTH:  May 24, 1929  SUBJECTIVE:  CHIEF COMPLAINT:   Chief Complaint  Patient presents with  . Fall  . Hip Pain   -Postop day 4 after left hip fracture repair - slightly more alert and oriented to self today, following simple commands and working with PT this afternoon. - still confused and not oriented to situation  REVIEW OF SYSTEMS:  Review of Systems  Constitutional: Positive for malaise/fatigue. Negative for chills and fever.  HENT: Positive for hearing loss. Negative for congestion, ear discharge and nosebleeds.   Eyes: Negative for blurred vision and double vision.  Respiratory: Negative for cough, shortness of breath and wheezing.   Cardiovascular: Negative for chest pain, palpitations and leg swelling.  Gastrointestinal: Negative for abdominal pain, constipation, diarrhea, nausea and vomiting.  Genitourinary: Negative for dysuria.  Musculoskeletal: Positive for joint pain and myalgias.  Neurological: Negative for dizziness, focal weakness, seizures, weakness and headaches.  Psychiatric/Behavioral: Negative for depression.    DRUG ALLERGIES:   Allergies  Allergen Reactions  . Codeine Nausea And Vomiting  . Penicillins Swelling and Other (See Comments)    Lips swell Has patient had a PCN reaction causing immediate rash, facial/tongue/throat swelling, SOB or lightheadedness with hypotension: Yes Has patient had a PCN reaction causing severe rash involving mucus membranes or skin necrosis: No Has patient had a PCN reaction that required hospitalization: No Has patient had a PCN reaction occurring within the last 10 years: No If all of the above answers are "NO", then may proceed with Cephalosporin use.     VITALS:  Blood pressure 126/85, pulse 85, temperature 97.8 F (36.6 C), temperature source Oral, resp. rate 18, height '5\' 6"'  (1.676 m), weight 59.9  kg (132 lb), SpO2 97 %.  PHYSICAL EXAMINATION:  Physical Exam  GENERAL:  84 y.o.-year-old elderly patient lying in the bed with no acute distress.  EYES: Pupils equal, round, reactive to light and accommodation. No scleral icterus. Extraocular muscles intact.  HEENT: Head atraumatic, normocephalic. Oropharynx and nasopharynx clear.  NECK:  Supple, no jugular venous distention. No thyroid enlargement, no tenderness.  LUNGS: Normal breath sounds bilaterally, no wheezing, rales,rhonchi or crepitation. No use of accessory muscles of respiration. Decreased bibasilar breath sounds CARDIOVASCULAR: S1, S2 normal. No  rubs, or gallops. 3/6 systolic murmur present ABDOMEN: Soft, nontender, nondistended. Bowel sounds present. No organomegaly or mass.  EXTREMITIES: left thigh dressing in place. No pedal edema, cyanosis, or clubbing.  NEUROLOGIC: Cranial nerves II through XII are intact. Muscle strength 5/5 in all extremities except left leg due to pain from recent surgery. Sensation intact. Gait not checked. Global weakness PSYCHIATRIC: The patient is alert  and following commands.  Oriented to self only SKIN: No obvious rash, lesion, or ulcer.    LABORATORY PANEL:   CBC Recent Labs  Lab 05/28/17 0411  WBC 7.3  HGB 8.5*  HCT 25.2*  PLT 220   ------------------------------------------------------------------------------------------------------------------  Chemistries  Recent Labs  Lab 05/26/17 0419  05/29/17 0513  NA 138   < > 140  K 4.8   < > 3.0*  CL 104   < > 107  CO2 23   < > 21*  GLUCOSE 115*   < > 111*  BUN 31*   < > 28*  CREATININE 1.24*   < > 1.04*  CALCIUM 10.4*  10.1   < > 8.7*  AST  53*  --   --   ALT 26  --   --   ALKPHOS 75  --   --   BILITOT 2.1*  --   --    < > = values in this interval not displayed.   ------------------------------------------------------------------------------------------------------------------  Cardiac Enzymes No results for input(s):  TROPONINI in the last 168 hours. ------------------------------------------------------------------------------------------------------------------  RADIOLOGY:  No results found.  EKG:   Orders placed or performed during the hospital encounter of 05/24/17  . ED EKG  . ED EKG  . EKG 12-Lead  . EKG 12-Lead    ASSESSMENT AND PLAN:   Theresa Sliva Dodsonis a 84 y.o.femalewith a history of hypertension as well as intracranial hemorrhage not on any blood thinners at this time who is presenting after losing her balance walking with her cane and falling on her left hip.     1.  Left intertrochanteric fracture with associated superior and inferior pelvic fractures. - -Appreciate orthopedic consult.  Patient is status post intramedullary fixation of left intertrochanteric hip fracture.  Postop day 4 today -Hemoglobin is stable after transfusion prior to surgery. -Delirium noted, discontinued IV narcotics.  on tramadol and muscle relaxants for pain -Continue physical therapy  2. Incidental finding of innumerable skull Lytic lesions:  Concern for multiple myeloma especially given her renal failure, hypercalcemia and anemia -Appreciate oncology consult -Related lab work ordered - on a 24hr urine collection -Received 1 dose of Zometa for hypercalcemia with improvement  3. Anemia: Likely anemia of chronic disease.  Work-up for myeloma being done -Received 2 units transfusion prior to surgery and hemoglobin at 8.5.  No indication for transfusion unless less than 7. -Continue to monitor -On iron supplements  4. CKD: Stage III; avoid nephrotoxic agents.  5. Hypertension: Continue metoprolol  6.  Acute delirium-discontinued morphine. avoid Ativan if possible. -For agitation, give Haldol if really needed. -Tylenol and tramadol only for pain as needed. - Patient takes Paxil at home which we will continue at this time -Avoid benzodiazepines if possible. -If no improvement, will  get MRI of the brain to rule out metastatic disease -Urine analysis with minimal bacteria noted.  Urine culture still pending will start on Cipro - some improvement noted today  7. DVT prophylaxis: Lovenox- f/u INR in AM as some bleeding on the dressing.   Will need rehab at discharge   All the records are reviewed and case discussed with Care Management/Social Workerr. Management plans discussed with the patient, family and they are in agreement.  CODE STATUS: DNR  TOTAL TIME TAKING CARE OF THIS PATIENT: 41 minutes.   POSSIBLE D/C IN  1-2 DAYS, DEPENDING ON CLINICAL CONDITION.   Gladstone Lighter M.D on 05/29/2017 at 1:55 PM  Between 7am to 6pm - Pager - [phone]  After 6pm go to [email or web address] - password [email or web address] [facility]  Office  [phone]  CC: Primary care physician; [name], MD

## 2017-05-29 NOTE — Progress Notes (Signed)
  Subjective:  POD #4 s/p IM fixation for left intertrochanteric hip fracture.  Patient more alert today but still confused.   Unable to follow commands or provide a history.  Her son is at the bedside.  Objective:   VITALS:   Vitals:   05/28/17 1923 05/28/17 2056 05/29/17 0014 05/29/17 0800  BP: (!) 159/87 (!) 151/76 (!) 163/84 (!) 122/109  Pulse: (!) 104 89 82 85  Resp:   15 18  Temp:   98.4 F (36.9 C) 97.8 F (36.6 C)  TempSrc:   Oral Oral  SpO2: 99% 99% 100% 97%  Weight:      Height:        PHYSICAL EXAM: LEFT HIP:   Intact pulses distally Spontaneous Dorsiflexion/Plantar flexion intact Bandage with moderate drainage No cellulitis present Compartment soft  LABS  Results for orders placed or performed during the hospital encounter of 05/24/17 (from the past 24 hour(s))  Basic metabolic panel     Status: Abnormal   Collection Time: 05/29/17  5:13 AM  Result Value Ref Range   Sodium 140 135 - 145 mmol/L   Potassium 3.0 (L) 3.5 - 5.1 mmol/L   Chloride 107 101 - 111 mmol/L   CO2 21 (L) 22 - 32 mmol/L   Glucose, Bld 111 (H) 65 - 99 mg/dL   BUN 28 (H) 6 - 20 mg/dL   Creatinine, Ser 1.04 (H) 0.44 - 1.00 mg/dL   Calcium 8.7 (L) 8.9 - 10.3 mg/dL   GFR calc non Af Amer 48 (L) >60 mL/min   GFR calc Af Amer 55 (L) >60 mL/min   Anion gap 12 5 - 15    No results found.  Assessment/Plan: 4 Days Post-Op   Active Problems:   Hip fracture ([place])  Patient with drainage on her bandage.   I am going to check INR/PTT tomorrow.  If she remains with elevated INR and/or PTT then lovenox may be stopped to slow drainage from incision.  Continue PT when patient able to participate.      Thornton Park , MD 05/29/2017, 12:07 PM

## 2017-05-29 NOTE — Progress Notes (Signed)
Patient's CrCl > 30 ml/min will readjust Cipro back to 400 mg IV q12h. QTc 528 -- would recommend switching to another agent or getting a repeat EKG.  Tobie Lords, PharmD, BCPS Clinical Pharmacist 05/29/2017

## 2017-05-29 NOTE — Progress Notes (Signed)
[place] for enoxaparin Indication: VTE prophylaxis  Allergies  Allergen Reactions  . Codeine Nausea And Vomiting  . Penicillins Swelling and Other (See Comments)    Lips swell Has patient had a PCN reaction causing immediate rash, facial/tongue/throat swelling, SOB or lightheadedness with hypotension: Yes Has patient had a PCN reaction causing severe rash involving mucus membranes or skin necrosis: No Has patient had a PCN reaction that required hospitalization: No Has patient had a PCN reaction occurring within the last 10 years: No If all of the above answers are "NO", then may proceed with Cephalosporin use.     Patient Measurements: Height: 5\' 6"  (167.6 cm) Weight: 132 lb (59.9 kg) IBW/kg (Calculated) : 59.3   Vital Signs: Temp: 97.8 F (36.6 C) (05/07 0800) Temp Source: Oral (05/07 0800) BP: 122/109 (05/07 0800) Pulse Rate: 85 (05/07 0800)  Labs: Recent Labs    05/27/17 0353 05/28/17 0411 05/29/17 0513  HGB 8.4* 8.5*  --   HCT 24.7* 25.2*  --   PLT 195 220  --   CREATININE 1.49* 1.35* 1.04*    Estimated Creatinine Clearance: 37 mL/min (A) (by C-G formula based on SCr of 1.04 mg/dL (H)).   Medical History: Past Medical History:  Diagnosis Date  . Arthritis    hands  . Claustrophobia   . Depression    with anxiety/h/o agoraphobia  . Glaucoma   . HOH (hard of hearing)   . Hyperlipidemia   . Hypertension   . PONV (postoperative nausea and vomiting)     Medications:  No PTA anticoag   Assessment: 84 yo female presents with hip fracture s/p surgery. Pharmacy has been consulted for lovenox dosing for VTE prophylaxis. SCr improved. CrCl now > 30 ml/min.    Plan:  Pt currently ordered lovenox 30 mg SQ Q24H. CrCl now >30 ml/min. Will adjust to 40 mg Sq q24h. Hgb and plt count have been stable post-op.   Pharmacy will continue to follow.   Rayna Sexton, PharmD, BCPS Clinical Pharmacist 05/29/2017 11:33 AM

## 2017-05-30 ENCOUNTER — Telehealth: Payer: Self-pay | Admitting: Internal Medicine

## 2017-05-30 DIAGNOSIS — C9000 Multiple myeloma not having achieved remission: Secondary | ICD-10-CM

## 2017-05-30 LAB — BASIC METABOLIC PANEL
Anion gap: 10 (ref 5–15)
BUN: 24 mg/dL — ABNORMAL HIGH (ref 6–20)
CHLORIDE: 104 mmol/L (ref 101–111)
CO2: 23 mmol/L (ref 22–32)
CREATININE: 1.02 mg/dL — AB (ref 0.44–1.00)
Calcium: 7.8 mg/dL — ABNORMAL LOW (ref 8.9–10.3)
GFR calc non Af Amer: 49 mL/min — ABNORMAL LOW (ref >60)
GFR, EST AFRICAN AMERICAN: 56 mL/min — AB (ref >60)
GLUCOSE: 106 mg/dL — AB (ref 65–99)
Potassium: 3 mmol/L — ABNORMAL LOW (ref 3.5–5.1)
Sodium: 137 mmol/L (ref 135–145)

## 2017-05-30 LAB — APTT: aPTT: 38 seconds — ABNORMAL HIGH (ref 24–36)

## 2017-05-30 LAB — POTASSIUM: POTASSIUM: 4.6 mmol/L (ref 3.5–5.1)

## 2017-05-30 LAB — MAGNESIUM: Magnesium: 1.8 mg/dL (ref 1.7–2.4)

## 2017-05-30 LAB — PROTIME-INR
INR: 1.79
PROTHROMBIN TIME: 20.6 s — AB (ref 11.4–15.2)

## 2017-05-30 MED ORDER — ENOXAPARIN SODIUM 40 MG/0.4ML ~~LOC~~ SOLN
40.0000 mg | SUBCUTANEOUS | Status: DC
  Administered 2017-05-31: 40 mg via SUBCUTANEOUS
  Filled 2017-05-30: qty 0.4

## 2017-05-30 MED ORDER — POTASSIUM CHLORIDE 20 MEQ PO PACK
40.0000 meq | PACK | Freq: Once | ORAL | Status: AC
  Administered 2017-05-30: 40 meq via ORAL
  Filled 2017-05-30: qty 2

## 2017-05-30 MED ORDER — POTASSIUM CHLORIDE 10 MEQ/100ML IV SOLN
10.0000 meq | INTRAVENOUS | Status: AC
  Administered 2017-05-30 (×4): 10 meq via INTRAVENOUS
  Filled 2017-05-30 (×4): qty 100

## 2017-05-30 MED ORDER — ADULT MULTIVITAMIN W/MINERALS CH
1.0000 | ORAL_TABLET | Freq: Every day | ORAL | Status: DC
  Administered 2017-05-31: 1 via ORAL
  Filled 2017-05-30: qty 1

## 2017-05-30 NOTE — Progress Notes (Addendum)
Urinary catheter removed. Pt tolerated well.

## 2017-05-30 NOTE — Progress Notes (Signed)
Physical Therapy Treatment Patient Details Name: Theresa Lynch MRN: [id number] DOB: 1929/06/13 Today's Date: 05/30/2017    History of Present Illness Patient is an 84 y/o female that presents s/p fall and L hip fracture, IM nailing performed to repair hip. Of note she was noted to have abnormalities on CT scan, concern is for multiple myeloma and is being worked up by oncology team.     PT Comments    Pt presents with deficits in strength, transfers, mobility, gait, balance, and activity tolerance but is slowly progressing towards goals.  Pt required +1 mod A with extensive cues during sup to sit with log roll technique and +2 max A for sit to sup.  Pt able to stand from the EOB with Mod A initially with L knee in a flexed position avoiding WB but was gradually with cues and encouragement was able to increase WB through her LLE.  Pt was then able to take several small steps at the EOB but was antalgic on the LLE with difficulty advancing the RLE.  Pt actively participated in all aspects of this session and would be appropriate for discharge to a SNF to continue to address the above deficits for decreased caregiver assistance and return towards her PLOF.        Follow Up Recommendations  SNF     Equipment Recommendations  Other (comment)(TBD at next venue of care)    Recommendations for Other Services       Precautions / Restrictions Precautions Precautions: Fall Restrictions Weight Bearing Restrictions: Yes LLE Weight Bearing: Weight bearing as tolerated    Mobility  Bed Mobility Overal bed mobility: Needs Assistance Bed Mobility: Sit to Supine;Supine to Sit     Supine to sit: Mod assist Sit to supine: +2 for physical assistance;Max assist   General bed mobility comments: Mod verbal and tactile cues for sequencing with bed mobility tasks  Transfers Overall transfer level: Needs assistance Equipment used: Rolling walker (2 wheeled) Transfers: Sit to/from Stand Sit to  Stand: +2 safety/equipment;Mod assist;From elevated surface         General transfer comment: Mod verbal and tactile cues for sequencing with decreased physical assistance required to stand this session  Ambulation/Gait Ambulation/Gait assistance: Min assist Ambulation Distance (Feet): 2 Feet Assistive device: Rolling walker (2 wheeled) Gait Pattern/deviations: Antalgic;Decreased stance time - left;Decreased step length - right;Step-to pattern     General Gait Details: Min A for stability with mod verbal cues for sequencing with pt limited by L hip pain with weight bearing   Stairs             Wheelchair Mobility    Modified Rankin (Stroke Patients Only)       Balance Overall balance assessment: Needs assistance Sitting-balance support: Bilateral upper extremity supported;Feet supported Sitting balance-Leahy Scale: Fair Sitting balance - Comments: Pt able to maintain static sitting balance this session without physical assistance   Standing balance support: Bilateral upper extremity supported Standing balance-Leahy Scale: Poor                              Cognition Arousal/Alertness: Lethargic Behavior During Therapy: WFL for tasks assessed/performed Overall Cognitive Status: Impaired/Different from baseline Area of Impairment: Orientation;Attention                       Following Commands: Follows one step commands with increased time       General Comments: Pt  actively participated during session      Exercises Total Joint Exercises Ankle Circles/Pumps: Strengthening;Both;5 reps;10 reps Quad Sets: Strengthening;Both;10 reps Short Arc Quad: AROM;AAROM;Left;5 reps;10 reps Heel Slides: AAROM;Left;5 reps Hip ABduction/ADduction: AAROM;Both;10 reps Straight Leg Raises: AAROM;Both;10 reps Long Arc Quad: AAROM;Left;10 reps Knee Flexion: AAROM;Left;10 reps Other Exercises Other Exercises: Log roll technique training with sup to/from  sit    General Comments        Pertinent Vitals/Pain Pain Assessment: Faces Pain Location: "medium" pain to L hip Pain Descriptors / Indicators: Aching;Operative site guarding Pain Intervention(s): Premedicated before session;Monitored during session    Home Living                      Prior Function            PT Goals (current goals can now be found in the care plan section) Progress towards PT goals: Progressing toward goals    Frequency    BID      PT Plan Current plan remains appropriate    Co-evaluation              AM-PAC PT "6 Clicks" Daily Activity  Outcome Measure                   End of Session Equipment Utilized During Treatment: Gait belt Activity Tolerance: Patient limited by pain Patient left: in bed;with bed alarm set;with call bell/phone within reach;with family/visitor present Nurse Communication: Mobility status PT Visit Diagnosis: Unsteadiness on feet (R26.81);Muscle weakness (generalized) (M62.81);History of falling (Z91.81)     Time: 1135-1200 PT Time Calculation (min) (ACUTE ONLY): 25 min  Charges:  $Therapeutic Exercise: 8-22 mins $Therapeutic Activity: 8-22 mins                    G Codes:       DRoyetta Asal PT, DPT 05/30/17, 12:29 PM

## 2017-05-30 NOTE — Progress Notes (Signed)
Assessment done. Awake and mildly confused, though cooperative with care. No acute distress, denies pain at present. Bed alarm on, call bell in reach.

## 2017-05-30 NOTE — Progress Notes (Signed)
  Subjective:  POD #5 s/p IM sedation for left intertrochanteric hip fracture.  Patient reports left hip pain as mild to moderate.  Her son is at the bedside.  Patient is more alert today.  She is able to follow commands and provide history.  Objective:   VITALS:   Vitals:   05/29/17 2350 05/30/17 0516 05/30/17 0814 05/30/17 0814  BP: (!) 144/73  (!) 169/90 (!) 169/90  Pulse: 75  82 82  Resp: 16  15 15   Temp: (!) 97.5 F (36.4 C)  97.6 F (36.4 C) 97.6 F (36.4 C)  TempSrc:   Oral   SpO2: 97%  97% 97%  Weight:  69.9 kg (154 lb)    Height:        PHYSICAL EXAM: Left lower extremity: Neurovascular intact Sensation intact distally Intact pulses distally Dorsiflexion/Plantar flexion intact Bandage with moderate drainage. No cellulitis present Compartment soft  LABS  Results for orders placed or performed during the hospital encounter of 05/24/17 (from the past 24 hour(s))  Protime-INR     Status: Abnormal   Collection Time: 05/30/17  4:50 AM  Result Value Ref Range   Prothrombin Time 20.6 (H) 11.4 - 15.2 seconds   INR 1.79   APTT     Status: Abnormal   Collection Time: 05/30/17  4:50 AM  Result Value Ref Range   aPTT 38 (H) 24 - 36 seconds  Basic metabolic panel     Status: Abnormal   Collection Time: 05/30/17  4:50 AM  Result Value Ref Range   Sodium 137 135 - 145 mmol/L   Potassium 3.0 (L) 3.5 - 5.1 mmol/L   Chloride 104 101 - 111 mmol/L   CO2 23 22 - 32 mmol/L   Glucose, Bld 106 (H) 65 - 99 mg/dL   BUN 24 (H) 6 - 20 mg/dL   Creatinine, Ser 1.02 (H) 0.44 - 1.00 mg/dL   Calcium 7.8 (L) 8.9 - 10.3 mg/dL   GFR calc non Af Amer 49 (L) >60 mL/min   GFR calc Af Amer 56 (L) >60 mL/min   Anion gap 10 5 - 15  Magnesium     Status: None   Collection Time: 05/30/17  4:50 AM  Result Value Ref Range   Magnesium 1.8 1.7 - 2.4 mg/dL  Potassium     Status: None   Collection Time: 05/30/17  4:14 PM  Result Value Ref Range   Potassium 4.6 3.5 - 5.1 mmol/L    No results  found.  Assessment/Plan: 5 Days Post-Op   Active Problems:   Hip fracture [facility])  Patient is more alert today.  She is taking Tylenol and tramadol for pain.  Her INRs is still elevated to 1.7.  Patient is currently on Lovenox for DVT prophylaxis.  Her elevated INR may be causing persistent drainage from her left hip incision.  Enteric-coated aspirin 81mg  po bid for  DVT prophylaxis may be a better alternative for this patient if her drainage persists.  Will check dressing again tomorrow and discuss with medicine.   Thornton Park , MD 05/30/2017, 8:04 PM

## 2017-05-30 NOTE — Progress Notes (Signed)
Theresa Lynch   DOB:09/19/1929   HW#:[id number]    Subjective: Patient is confused; but alert.  She is alone.   She is not in pain.;  She has been working with physical therapy able to stand.  She still has a Foley catheter.  ROS: Difficult to assess given patient's mentation  Objective:  Vitals:   05/30/17 0814 05/30/17 2025  BP: (!) 169/90 (!) 141/75  Pulse: 82 70  Resp: 15 18  Temp: 97.6 F (36.4 C) 97.9 F (36.6 C)  SpO2: 97% 98%     Intake/Output Summary (Last 24 hours) at 05/30/2017 2139 Last data filed at 05/30/2017 1700 Gross per 24 hour  Intake 1647 ml  Output 1775 ml  Net -128 ml    GENERAL: Moderately built moderately nourished female patient drowsy but confused.  No distress and comfortable.     She is alone.  EYES: Positive for pallor. OROPHARYNX: no thrush or ulceration. NECK: supple, no masses felt LYMPH:  no palpable lymphadenopathy in the cervical, axillary or inguinal regions LUNGS: decreased breath sounds to auscultation at bases and  No wheeze or crackles HEART/CVS: regular rate & rhythm and no murmurs; No lower extremity edema ABDOMEN: abdomen soft, non-tender and normal bowel sounds Musculoskeletal:no cyanosis of digits and no clubbing  PSYCH: drowsy/ arousable; Oriented x0.  NEURO: no focal motor/sensory deficits SKIN:  no rashes or significant lesions    Labs:  Lab Results  Component Value Date   WBC 7.3 05/28/2017   HGB 8.5 (L) 05/28/2017   HCT 25.2 (L) 05/28/2017   MCV 96.1 05/28/2017   PLT 220 05/28/2017   NEUTROABS 3.7 05/24/2017    Lab Results  Component Value Date   NA 137 05/30/2017   K 4.6 05/30/2017   CL 104 05/30/2017   CO2 23 05/30/2017    Studies:  No results found.  Assessment & Plan:  # 84 year old female patient with a history of mild to moderate dementia-admitted the hospital for left hip fracture/and anemia  #Active multiple myeloma-new diagnosis [based on severe anemia; renal insufficiency; multiple calvarial  lesions; hypercalcemia]-M protein 3.4 g/dL; IgG kappa; elevated kappa lambda light chain ratio.  Patient could receive palliative treatment with dexamethasone/Revlimid.  However patient's current delirium preclude the use of dexamethasone.   #Hypercalcemia-status post Zometa 3 mg/given renal insufficiency; calcium is 7.5/low albumin.  Will initiate calcium and vitamin D.   # Mild to moderate dementia/currently delirious post surgery-.  Improving.   #Left hip fracture-question pathologic versus status post fall.    Awaiting discharge to nursing home.   #I spoke to patient's son [name] regarding the above diagnosis; we will plan to meet the patient's family in the morning tomorrow.  Cammie Sickle, MD 05/30/2017  9:39 PM

## 2017-05-30 NOTE — Progress Notes (Signed)
Pt alert but confused. IV infusing. Pt on room air. Daughter at bedside. Pt working with PT at this time. Daughter states pt had BM last night. No complaints at this time.

## 2017-05-30 NOTE — Progress Notes (Addendum)
Nutrition Follow-up  DOCUMENTATION CODES:   Not applicable  INTERVENTION:  Continue Ensure Enlive po BID, each supplement provides 350 kcal and 20 grams of protein. Patient prefers chocolate.  Provide Magic cup TID with meals, each supplement provides 290 kcal and 9 grams of protein.  Provide daily MVI.  NUTRITION DIAGNOSIS:   Increased nutrient needs related to post-op healing as evidenced by estimated needs.  New nutrition diagnosis.  GOAL:   Patient will meet greater than or equal to 90% of their needs  Progressing.  MONITOR:   PO intake, Supplement acceptance, Labs, Weight trends, I & O's, Skin  REASON FOR ASSESSMENT:   Malnutrition Screening Tool    ASSESSMENT:   The patient with past medical history of hypertension, hyperlipidemia and history of traumatic brain injury with intraparenchymal hemorrhage presents to the emergency department following a fall, L Hip Fx. She was also found with lytic lesions on brain via CT scan concerning for malignancy   -Patient s/p intramedullary fixation of left intertrochanteric hip fracture on 5/3.  Met with patient and her daughter at bedside. Patient fairly lethargic today. Daughter reports that patient is having a poor appetite still. They have been ordering patient softer foods the past few days such as mashed potatoes with gravy, pudding, and eggs. She also had one piece of bacon on her tray this morning. She is drinking Ensure occasionally and prefers the chocolate flavor. They also think she would like [employer].   Medications reviewed and include: ferrous sulfate 325 mg TID, levothyroxine, Lopressor, Miralax, NS @ 60 mL/hr, ciprofloxacin, potassium chloride 10 mEq IV 4 times today.  Labs reviewed: Potassium 3, BUN 24.  Meal Completion: 0-40%  Weight trend: 69.9 kg on 5/8; if accurate this is +10 kg from 5/3  Diet Order:   Diet Order           Diet regular Room service appropriate? Yes; Fluid consistency: Thin  Diet  effective now          EDUCATION NEEDS:   Not appropriate for education at this time  Skin:  Skin Assessment: Skin Integrity Issues: Skin Integrity Issues:: Incisions Incisions: closed incision left hip  Last BM:  05/29/2017 - type 1/type 4  Height:   Ht Readings from Last 1 Encounters:  05/24/17 '5\' 6"'  (1.676 m)    Weight:   Wt Readings from Last 1 Encounters:  05/30/17 154 lb (69.9 kg)    Ideal Body Weight:  59.09 kg  BMI:  Body mass index is 24.86 kg/m.  Estimated Nutritional Needs:   Kcal:  1400-1600 calories (MSJ x1.3-1.5)  Protein:  78-90 grams (1.3-1.5g/kg)  Fluid:  >1.5L  Willey Blade, MS, RD, LDN Office: [phone] Pager: [phone] After Hours/Weekend Pager: [phone]

## 2017-05-30 NOTE — Progress Notes (Signed)
[facility] [place] NAME: Theresa Lynch    MR#:  [id number]  DATE OF BIRTH:  [birth year]  SUBJECTIVE:  CHIEF COMPLAINT:   Chief Complaint  Patient presents with  . Fall  . Hip Pain   -Postop day 5 after left hip fracture repair -Remains more alert though disoriented.  Following simple commands.  Complaining of needing to have a bowel movement   REVIEW OF SYSTEMS:  Review of Systems  Constitutional: Positive for malaise/fatigue. Negative for chills and fever.  HENT: Positive for hearing loss. Negative for congestion, ear discharge and nosebleeds.   Eyes: Negative for blurred vision and double vision.  Respiratory: Negative for cough, shortness of breath and wheezing.   Cardiovascular: Negative for chest pain, palpitations and leg swelling.  Gastrointestinal: Negative for abdominal pain, constipation, diarrhea, nausea and vomiting.  Genitourinary: Negative for dysuria.  Musculoskeletal: Positive for joint pain and myalgias.  Neurological: Negative for dizziness, focal weakness, seizures, weakness and headaches.  Psychiatric/Behavioral: Negative for depression.    DRUG ALLERGIES:   Allergies  Allergen Reactions  . Codeine Nausea And Vomiting  . Penicillins Swelling and Other (See Comments)    Lips swell Has patient had a PCN reaction causing immediate rash, facial/tongue/throat swelling, SOB or lightheadedness with hypotension: Yes Has patient had a PCN reaction causing severe rash involving mucus membranes or skin necrosis: No Has patient had a PCN reaction that required hospitalization: No Has patient had a PCN reaction occurring within the last 10 years: No If all of the above answers are "NO", then may proceed with Cephalosporin use.     VITALS:  Blood pressure (!) 169/90, pulse 82, temperature 97.6 F (36.4 C), temperature source Oral, resp. rate 15, height _0  (1.676 m), weight 69.9 kg (154 lb), SpO2 97 %.  PHYSICAL  EXAMINATION:  Physical Exam  GENERAL:  84 y.o.-year-old elderly patient lying in the bed with no acute distress.  EYES: Pupils equal, round, reactive to light and accommodation. No scleral icterus. Extraocular muscles intact.  HEENT: Head atraumatic, normocephalic. Oropharynx and nasopharynx clear.  NECK:  Supple, no jugular venous distention. No thyroid enlargement, no tenderness.  LUNGS: Normal breath sounds bilaterally, no wheezing, rales,rhonchi or crepitation. No use of accessory muscles of respiration. Decreased bibasilar breath sounds CARDIOVASCULAR: S1, S2 normal. No  rubs, or gallops. 3/6 systolic murmur present ABDOMEN: Soft, nontender, nondistended. Bowel sounds present. No organomegaly or mass.  EXTREMITIES: left thigh dressing in place. No pedal edema, cyanosis, or clubbing.  NEUROLOGIC: Cranial nerves II through XII are intact. Muscle strength 5/5 in all extremities except left leg due to pain from recent surgery. Sensation intact. Gait not checked. Global weakness PSYCHIATRIC: The patient is alert  and following commands.  Oriented to self only SKIN: No obvious rash, lesion, or ulcer.    LABORATORY PANEL:   CBC Recent Labs  Lab 05/28/17 0411  WBC 7.3  HGB 8.5*  HCT 25.2*  PLT 220   ------------------------------------------------------------------------------------------------------------------  Chemistries  Recent Labs  Lab 05/26/17 0419  05/30/17 0450  NA 138   < > 137  K 4.8   < > 3.0*  CL 104   < > 104  CO2 23   < > 23  GLUCOSE 115*   < > 106*  BUN 31*   < > 24*  CREATININE 1.24*   < > 1.02*  CALCIUM 10.4*  10.1   < > 7.8*  MG  --    < >  1.8  AST 53*  --   --   ALT 26  --   --   ALKPHOS 75  --   --   BILITOT 2.1*  --   --    < > = values in this interval not displayed.   ------------------------------------------------------------------------------------------------------------------  Cardiac Enzymes No results for input(s): TROPONINI in the  last 168 hours. ------------------------------------------------------------------------------------------------------------------  RADIOLOGY:  No results found.  EKG:   Orders placed or performed during the hospital encounter of 05/24/17  . ED EKG  . ED EKG  . EKG 12-Lead  . EKG 12-Lead  . EKG 12-Lead  . EKG 12-Lead    ASSESSMENT AND PLAN:   Theresa Krichbaum Dodsonis a 84 y.o.femalewith a history of hypertension as well as intracranial hemorrhage not on any blood thinners at this time who is presenting after losing her balance walking with her cane and falling on her left hip.     1.  Left intertrochanteric fracture with associated superior and inferior pelvic fractures. - -Appreciate orthopedic consult.  Patient is status post intramedullary fixation of left intertrochanteric hip fracture.  Postop day 5 today -Hemoglobin is stable after transfusion prior to surgery. -Delirium noted, discontinued IV narcotics.  on tramadol and muscle relaxants for pain -Continue physical therapy  2. Incidental finding of innumerable skull Lytic lesions:  Concern for multiple myeloma especially given her renal failure, hypercalcemia and anemia -Appreciate oncology consult -Related lab work ordered - on a 24hr urine collection -Received 1 dose of Zometa for hypercalcemia with improvement  3. Anemia: Likely anemia of chronic disease.  Work-up for myeloma being done -Received 2 units transfusion prior to surgery and hemoglobin at 8.5.  No indication for transfusion unless less than 7. -Continue to monitor -On iron supplements  4. CKD: Stage III; avoid nephrotoxic agents.  5. Hypokalemia-being replaced  6.  Acute delirium-discontinued narcotics and also avoid Ativan. -For agitation, give Haldol if really needed. -Tylenol and tramadol only for pain as needed. - Patient takes Paxil at home which we will continue at this time. --Urine analysis with minimal bacteria noted.  Urine  culture pending and on Cipro - improvement in mental status noted, remains alert though disoriented, follows commands  7. DVT prophylaxis: Lovenox low dose.  INR is slightly elevated ?  Could be from not eating   Will need rehab at discharge-to work with physical therapy again today Daughter-in-law updated at bedside  All the records are reviewed and case discussed with Care Management/Social Workerr. Management plans discussed with the patient, family and they are in agreement.  CODE STATUS: DNR  TOTAL TIME TAKING CARE OF THIS PATIENT: 38 minutes.   POSSIBLE D/C IN  1-2 DAYS, DEPENDING ON CLINICAL CONDITION.   Gladstone Lighter M.D on 05/30/2017 at 12:16 PM  Between 7am to 6pm - Pager - [phone]  After 6pm go to [email or web address] - password [email or web address] [facility]  Office  [phone]  CC: Primary care physician; [name], MD

## 2017-05-30 NOTE — Progress Notes (Signed)
[facility] authorization has been received, authorization # [id number]. Plan is for patient to D/C to [facility]. Patient's daughter [name] is aware of above. [facility] admissions coordinator at [facility] is aware of above.    [employer], LCSW [phone]

## 2017-05-30 NOTE — Telephone Encounter (Signed)
Left a message for the son to discuss the diagnosis of multiple myeloma/ treatment options.   Also, unable to reach the daughter [name].

## 2017-05-30 NOTE — Progress Notes (Signed)
Physical Therapy Treatment Patient Details Name: Theresa Lynch MRN: [id number] DOB: 06-Oct-1929 Today's Date: 05/30/2017    History of Present Illness Patient is an 84 y/o female that presents s/p fall and L hip fracture, IM nailing performed to repair hip. Of note she was noted to have abnormalities on CT scan, concern is for multiple myeloma and is being worked up by oncology team.     PT Comments    Pt presents with deficits in strength, transfers, mobility, gait, balance, and activity tolerance.  Pt continues to require extensive verbal and tactile cues and physical assistance with functional mobility tasks and is limited by L hip pain in standing.  Pt was able to stand for 30-45 sec but was unable to advance either LE this session secondary to L hip pain with WB.  Pt continues to give good effort during the session, however, and has the potential to progress towards goals.  Pt will benefit from PT services in a SNF setting upon discharge to safely address above deficits for decreased caregiver assistance and eventual return to PLOF.     Follow Up Recommendations  SNF     Equipment Recommendations  Other (comment)(TBD at next venue of care)    Recommendations for Other Services       Precautions / Restrictions Precautions Precautions: Fall Restrictions Weight Bearing Restrictions: Yes LLE Weight Bearing: Weight bearing as tolerated    Mobility  Bed Mobility Overal bed mobility: Needs Assistance Bed Mobility: Sit to Supine;Supine to Sit     Supine to sit: Mod assist Sit to supine: +2 for physical assistance;Max assist   General bed mobility comments: Mod verbal and tactile cues for sequencing with bed mobility tasks  Transfers Overall transfer level: Needs assistance Equipment used: Rolling walker (2 wheeled) Transfers: Sit to/from Stand Sit to Stand: Mod assist;From elevated surface;+2 physical assistance         General transfer comment: Mod verbal and  tactile cues for sequencing   Ambulation/Gait             General Gait Details: Pt unable to advance either LE secondary to pain this session   Stairs             Wheelchair Mobility    Modified Rankin (Stroke Patients Only)       Balance Overall balance assessment: Needs assistance Sitting-balance support: Bilateral upper extremity supported;Feet supported Sitting balance-Leahy Scale: Fair Sitting balance - Comments: Pt able to maintain static sitting balance this session without physical assistance Postural control: Posterior lean;Right lateral lean Standing balance support: Bilateral upper extremity supported Standing balance-Leahy Scale: Poor                              Cognition Arousal/Alertness: Lethargic Behavior During Therapy: WFL for tasks assessed/performed Overall Cognitive Status: Impaired/Different from baseline                                        Exercises Total Joint Exercises Ankle Circles/Pumps: Strengthening;Both;5 reps;10 reps Quad Sets: Strengthening;Both;10 reps Heel Slides: AAROM;Left;5 reps Hip ABduction/ADduction: AAROM;Both;10 reps Straight Leg Raises: AAROM;Both;10 reps Knee Flexion: AAROM;Left;10 reps Other Exercises Other Exercises: Log roll technique training with sup to/from sit    General Comments        Pertinent Vitals/Pain Pain Assessment: Faces Faces Pain Scale: Hurts even more Pain Intervention(s): Limited  activity within patient's tolerance;Premedicated before session;Monitored during session    Home Living                      Prior Function            PT Goals (current goals can now be found in the care plan section)      Frequency    BID      PT Plan Current plan remains appropriate    Co-evaluation              AM-PAC PT "6 Clicks" Daily Activity  Outcome Measure                   End of Session Equipment Utilized During Treatment:  Gait belt Activity Tolerance: Patient limited by pain Patient left: in bed;with call bell/phone within reach;with family/visitor present(No bed alarm required while family present per nursing) Nurse Communication: Mobility status PT Visit Diagnosis: Unsteadiness on feet (R26.81);Muscle weakness (generalized) (M62.81);History of falling (Z91.81)     Time: 6270-3500 PT Time Calculation (min) (ACUTE ONLY): 25 min  Charges:  $Therapeutic Exercise: 8-22 mins $Therapeutic Activity: 8-22 mins                    G Codes:       DRoyetta Asal PT, DPT 05/30/17, 4:45 PM

## 2017-05-31 ENCOUNTER — Inpatient Hospital Stay: Payer: PPO

## 2017-05-31 DIAGNOSIS — S72142D Displaced intertrochanteric fracture of left femur, subsequent encounter for closed fracture with routine healing: Secondary | ICD-10-CM | POA: Diagnosis not present

## 2017-05-31 DIAGNOSIS — S3282XD Multiple fractures of pelvis without disruption of pelvic ring, subsequent encounter for fracture with routine healing: Secondary | ICD-10-CM | POA: Diagnosis not present

## 2017-05-31 DIAGNOSIS — F0390 Unspecified dementia without behavioral disturbance: Secondary | ICD-10-CM | POA: Diagnosis not present

## 2017-05-31 DIAGNOSIS — M1990 Unspecified osteoarthritis, unspecified site: Secondary | ICD-10-CM | POA: Diagnosis not present

## 2017-05-31 DIAGNOSIS — Z79899 Other long term (current) drug therapy: Secondary | ICD-10-CM | POA: Diagnosis not present

## 2017-05-31 DIAGNOSIS — N183 Chronic kidney disease, stage 3 (moderate): Secondary | ICD-10-CM | POA: Diagnosis not present

## 2017-05-31 DIAGNOSIS — D631 Anemia in chronic kidney disease: Secondary | ICD-10-CM | POA: Diagnosis not present

## 2017-05-31 DIAGNOSIS — C9002 Multiple myeloma in relapse: Secondary | ICD-10-CM | POA: Diagnosis not present

## 2017-05-31 DIAGNOSIS — Z7982 Long term (current) use of aspirin: Secondary | ICD-10-CM | POA: Diagnosis not present

## 2017-05-31 DIAGNOSIS — F418 Other specified anxiety disorders: Secondary | ICD-10-CM | POA: Diagnosis not present

## 2017-05-31 DIAGNOSIS — I129 Hypertensive chronic kidney disease with stage 1 through stage 4 chronic kidney disease, or unspecified chronic kidney disease: Secondary | ICD-10-CM | POA: Diagnosis not present

## 2017-05-31 DIAGNOSIS — M25551 Pain in right hip: Secondary | ICD-10-CM | POA: Diagnosis not present

## 2017-05-31 DIAGNOSIS — N289 Disorder of kidney and ureter, unspecified: Secondary | ICD-10-CM | POA: Diagnosis not present

## 2017-05-31 DIAGNOSIS — I10 Essential (primary) hypertension: Secondary | ICD-10-CM | POA: Diagnosis not present

## 2017-05-31 DIAGNOSIS — R4182 Altered mental status, unspecified: Secondary | ICD-10-CM | POA: Diagnosis not present

## 2017-05-31 DIAGNOSIS — R634 Abnormal weight loss: Secondary | ICD-10-CM | POA: Diagnosis not present

## 2017-05-31 DIAGNOSIS — F325 Major depressive disorder, single episode, in full remission: Secondary | ICD-10-CM | POA: Diagnosis not present

## 2017-05-31 DIAGNOSIS — E8809 Other disorders of plasma-protein metabolism, not elsewhere classified: Secondary | ICD-10-CM | POA: Diagnosis not present

## 2017-05-31 DIAGNOSIS — S72002A Fracture of unspecified part of neck of left femur, initial encounter for closed fracture: Secondary | ICD-10-CM | POA: Diagnosis not present

## 2017-05-31 DIAGNOSIS — E7800 Pure hypercholesterolemia, unspecified: Secondary | ICD-10-CM | POA: Diagnosis not present

## 2017-05-31 DIAGNOSIS — W19XXXD Unspecified fall, subsequent encounter: Secondary | ICD-10-CM | POA: Diagnosis not present

## 2017-05-31 DIAGNOSIS — F329 Major depressive disorder, single episode, unspecified: Secondary | ICD-10-CM | POA: Diagnosis not present

## 2017-05-31 DIAGNOSIS — N390 Urinary tract infection, site not specified: Secondary | ICD-10-CM | POA: Diagnosis not present

## 2017-05-31 DIAGNOSIS — D649 Anemia, unspecified: Secondary | ICD-10-CM | POA: Diagnosis not present

## 2017-05-31 DIAGNOSIS — R410 Disorientation, unspecified: Secondary | ICD-10-CM | POA: Diagnosis not present

## 2017-05-31 DIAGNOSIS — K5900 Constipation, unspecified: Secondary | ICD-10-CM | POA: Diagnosis not present

## 2017-05-31 DIAGNOSIS — E785 Hyperlipidemia, unspecified: Secondary | ICD-10-CM | POA: Diagnosis not present

## 2017-05-31 DIAGNOSIS — M8000XD Age-related osteoporosis with current pathological fracture, unspecified site, subsequent encounter for fracture with routine healing: Secondary | ICD-10-CM | POA: Diagnosis not present

## 2017-05-31 DIAGNOSIS — H409 Unspecified glaucoma: Secondary | ICD-10-CM | POA: Diagnosis not present

## 2017-05-31 DIAGNOSIS — Z7401 Bed confinement status: Secondary | ICD-10-CM | POA: Diagnosis not present

## 2017-05-31 DIAGNOSIS — E559 Vitamin D deficiency, unspecified: Secondary | ICD-10-CM | POA: Diagnosis not present

## 2017-05-31 DIAGNOSIS — S72142A Displaced intertrochanteric fracture of left femur, initial encounter for closed fracture: Secondary | ICD-10-CM | POA: Diagnosis not present

## 2017-05-31 DIAGNOSIS — M899 Disorder of bone, unspecified: Secondary | ICD-10-CM | POA: Diagnosis not present

## 2017-05-31 DIAGNOSIS — E538 Deficiency of other specified B group vitamins: Secondary | ICD-10-CM | POA: Diagnosis not present

## 2017-05-31 DIAGNOSIS — E039 Hypothyroidism, unspecified: Secondary | ICD-10-CM | POA: Diagnosis not present

## 2017-05-31 DIAGNOSIS — R5383 Other fatigue: Secondary | ICD-10-CM | POA: Diagnosis not present

## 2017-05-31 DIAGNOSIS — C9000 Multiple myeloma not having achieved remission: Secondary | ICD-10-CM | POA: Diagnosis not present

## 2017-05-31 LAB — BASIC METABOLIC PANEL
ANION GAP: 8 (ref 5–15)
BUN: 19 mg/dL (ref 6–20)
CO2: 23 mmol/L (ref 22–32)
Calcium: 7.2 mg/dL — ABNORMAL LOW (ref 8.9–10.3)
Chloride: 103 mmol/L (ref 101–111)
Creatinine, Ser: 1.03 mg/dL — ABNORMAL HIGH (ref 0.44–1.00)
GFR calc Af Amer: 56 mL/min — ABNORMAL LOW (ref >60)
GFR, EST NON AFRICAN AMERICAN: 48 mL/min — AB (ref >60)
GLUCOSE: 93 mg/dL (ref 65–99)
Potassium: 4.1 mmol/L (ref 3.5–5.1)
Sodium: 134 mmol/L — ABNORMAL LOW (ref 135–145)

## 2017-05-31 LAB — CBC
HCT: 25.6 % — ABNORMAL LOW (ref 35.0–47.0)
Hemoglobin: 8.5 g/dL — ABNORMAL LOW (ref 12.0–16.0)
MCH: 32.5 pg (ref 26.0–34.0)
MCHC: 33.4 g/dL (ref 32.0–36.0)
MCV: 97.5 fL (ref 80.0–100.0)
PLATELETS: 276 10*3/uL (ref 150–440)
RBC: 2.63 MIL/uL — AB (ref 3.80–5.20)
RDW: 18.7 % — ABNORMAL HIGH (ref 11.5–14.5)
WBC: 8.5 10*3/uL (ref 3.6–11.0)

## 2017-05-31 LAB — URINE CULTURE

## 2017-05-31 MED ORDER — CIPROFLOXACIN HCL 500 MG PO TABS
500.0000 mg | ORAL_TABLET | Freq: Two times a day (BID) | ORAL | Status: DC
  Filled 2017-05-31: qty 1

## 2017-05-31 MED ORDER — ENOXAPARIN SODIUM 30 MG/0.3ML ~~LOC~~ SOLN: 30.0000 mg | SUBCUTANEOUS | 0 refills | Status: DC

## 2017-05-31 MED ORDER — ENSURE ENLIVE PO LIQD: 237.0000 mL | Freq: Two times a day (BID) | ORAL | 12 refills | Status: DC

## 2017-05-31 MED ORDER — DEXAMETHASONE 4 MG PO TABS
4.0000 mg | ORAL_TABLET | Freq: Every day | ORAL | Status: DC
  Administered 2017-05-31: 4 mg via ORAL
  Filled 2017-05-31: qty 1

## 2017-05-31 MED ORDER — CIPROFLOXACIN HCL 500 MG PO TABS: 500.0000 mg | ORAL_TABLET | Freq: Two times a day (BID) | ORAL | 0 refills | Status: AC

## 2017-05-31 MED ORDER — DEXAMETHASONE 4 MG PO TABS: 4.0000 mg | ORAL_TABLET | Freq: Every day | ORAL | 0 refills | Status: AC

## 2017-05-31 MED ORDER — TRAMADOL HCL 50 MG PO TABS: 50.0000 mg | ORAL_TABLET | Freq: Four times a day (QID) | ORAL | 0 refills | Status: DC | PRN

## 2017-05-31 MED ORDER — POLYETHYLENE GLYCOL 3350 17 G PO PACK: 17.0000 g | PACK | Freq: Every day | ORAL | 0 refills | Status: AC

## 2017-05-31 MED ORDER — AMLODIPINE BESYLATE 5 MG PO TABS: 5.0000 mg | ORAL_TABLET | Freq: Every day | ORAL | 0 refills | Status: DC

## 2017-05-31 MED ORDER — ACETAMINOPHEN 325 MG PO TABS: 650.0000 mg | ORAL_TABLET | Freq: Four times a day (QID) | ORAL | 2 refills | Status: DC | PRN

## 2017-05-31 MED ORDER — ENOXAPARIN SODIUM 30 MG/0.3ML ~~LOC~~ SOLN: 30.0000 mg | SUBCUTANEOUS | Status: DC

## 2017-05-31 MED ORDER — FERROUS SULFATE 325 (65 FE) MG PO TABS: 325.0000 mg | ORAL_TABLET | Freq: Every day | ORAL | 1 refills | Status: DC

## 2017-05-31 MED ORDER — LEVOTHYROXINE SODIUM 25 MCG PO TABS: 25.0000 ug | ORAL_TABLET | Freq: Every day | ORAL | 1 refills | Status: DC

## 2017-05-31 NOTE — Care Management Important Message (Signed)
Important Message  Patient Details  Name: Theresa Lynch MRN: [id number] Date of Birth: Oct 13, 1929   Medicare Important Message Given:  Yes    Juliann Pulse A [facility] 05/31/2017, 11:09 AM

## 2017-05-31 NOTE — Progress Notes (Signed)
PHARMACIST - PHYSICIAN COMMUNICATION DR:   Tressia Miners CONCERNING: Antibiotic IV to Oral Route Change Policy  RECOMMENDATION: This patient is receiving ciprofloxacin by the intravenous route.  Based on criteria approved by the Pharmacy and Therapeutics Committee, the antibiotic(s) is/are being converted to the equivalent oral dose form(s).   DESCRIPTION: These criteria include:  Patient being treated for a respiratory tract infection, urinary tract infection, cellulitis or clostridium difficile associated diarrhea if on metronidazole  The patient is not neutropenic and does not exhibit a GI malabsorption state  The patient is eating (either orally or via tube) and/or has been taking other orally administered medications for a least 24 hours  The patient is improving clinically and has a Tmax < 100.5  If you have questions about this conversion, please contact the Pharmacy Department  []   [phone] )  Theresa Lynch [x]   [phone] )  [facility] []   [phone] )  Theresa Lynch []   [phone] )  [facility] []   [phone] )  [facility]

## 2017-05-31 NOTE — Progress Notes (Signed)
  Subjective:  POD #6 s/p left IM for intertrochanteric hip fracture.  Patient reports left hip pain as mild.  Patient continues to be more alert today.  Her daughter is at the bedside.  Objective:   VITALS:   Vitals:   05/31/17 0008 05/31/17 0500 05/31/17 0744 05/31/17 0944  BP: (!) 153/73  (!) 154/77 (!) 157/82  Pulse: 67  78 71  Resp: 18  16   Temp: 98 F (36.7 C)  98 F (36.7 C)   TempSrc: Axillary     SpO2: 99%  99%   Weight:  64.9 kg (143 lb)    Height:        PHYSICAL EXAM: Left lower extremity: Neurovascular intact Sensation intact distally Intact pulses distally Dorsiflexion/Plantar flexion intact Dressing: Moderate serosanguineous drainage No cellulitis present Compartment soft  LABS  Results for orders placed or performed during the hospital encounter of 05/24/17 (from the past 24 hour(s))  Potassium     Status: None   Collection Time: 05/30/17  4:14 PM  Result Value Ref Range   Potassium 4.6 3.5 - 5.1 mmol/L  CBC     Status: Abnormal   Collection Time: 05/31/17  5:05 AM  Result Value Ref Range   WBC 8.5 3.6 - 11.0 K/uL   RBC 2.63 (L) 3.80 - 5.20 MIL/uL   Hemoglobin 8.5 (L) 12.0 - 16.0 g/dL   HCT 25.6 (L) 35.0 - 47.0 %   MCV 97.5 80.0 - 100.0 fL   MCH 32.5 26.0 - 34.0 pg   MCHC 33.4 32.0 - 36.0 g/dL   RDW 18.7 (H) 11.5 - 14.5 %   Platelets 276 150 - 440 K/uL  Basic metabolic panel     Status: Abnormal   Collection Time: 05/31/17  5:05 AM  Result Value Ref Range   Sodium 134 (L) 135 - 145 mmol/L   Potassium 4.1 3.5 - 5.1 mmol/L   Chloride 103 101 - 111 mmol/L   CO2 23 22 - 32 mmol/L   Glucose, Bld 93 65 - 99 mg/dL   BUN 19 6 - 20 mg/dL   Creatinine, Ser 1.03 (H) 0.44 - 1.00 mg/dL   Calcium 7.2 (L) 8.9 - 10.3 mg/dL   GFR calc non Af Amer 48 (L) >60 mL/min   GFR calc Af Amer 56 (L) >60 mL/min   Anion gap 8 5 - 15    No results found.  Assessment/Plan: 6 Days Post-Op   Active Problems:   Hip fracture [facility])  Patient will be discharged to  rehab today.  She will be discharged on 30 mg of Lovenox for DVT prophylaxis.  She should have daily dressing changes as needed.  Any physical therapy.  Patient is weightbearing as tolerated on the left lower extremity.  He will follow-up with me in the office in 7 to 10 days.    Thornton Park , MD 05/31/2017, 12:45 PM

## 2017-05-31 NOTE — Progress Notes (Signed)
No acute events over night, resp easy. Dressing to left hip with thin serous drainage not extending from dressing. Pt slept in short intervals through the night. Call bell in reach.

## 2017-05-31 NOTE — Progress Notes (Addendum)
Report called and given to Mateo Flow at [facility]. EMS called for transport. IV removed. Honeycomb dressing changed. Will assist pt in getting dressed and await transport to facility.

## 2017-05-31 NOTE — Discharge Summary (Addendum)
[facility] [place] NAME: Theresa Lynch    MR#:  [id number]  DATE OF BIRTH:  12/29/1929  DATE OF ADMISSION:  05/24/2017   ADMITTING PHYSICIAN: Harrie Foreman, MD  DATE OF DISCHARGE: 05/31/17  PRIMARY CARE PHYSICIAN: [name], MD   ADMISSION DIAGNOSIS:   Closed fracture of left hip, initial encounter ([employer]) [S72.002A] Fall, initial encounter [W19.XXXA] Closed nondisplaced fracture of pelvis, unspecified part of pelvis, initial encounter ([place]) [S32.9XXA]  DISCHARGE DIAGNOSIS:   Active Problems:   Hip fracture ([place])   SECONDARY DIAGNOSIS:   Past Medical History:  Diagnosis Date  . Arthritis    hands  . Claustrophobia   . Depression    with anxiety/h/o agoraphobia  . Glaucoma   . HOH (hard of hearing)   . Hyperlipidemia   . Hypertension   . PONV (postoperative nausea and vomiting)     HOSPITAL COURSE:   Theresa Hershkowitz Dodsonis a 84 y.o.femalewith a history of hypertension as well as intracranial hemorrhage not on any blood thinners at this time who is presenting after losing her balance walking with her cane and falling on her left hip.     1.  Left intertrochanteric fracture with associated superior and inferior pelvic fractures.- -Appreciate orthopedic consult.  Patient is status post intramedullary fixation of left intertrochanteric hip fracture.  Postop day 6 today -Hemoglobin is stable after transfusion prior to surgery. -Delirium noted, discontinued IV narcotics.  on tramadol and tylenol only for pain -Continue physical therapy  2. Multiple myeloma-new diagnosis. -Incidental finding of innumerable skull Lytic lesions: Concern for multiple myeloma was present especially given her renal failure, hypercalcemia and anemia -Appreciate oncology consult -Related lab work ordered-significantly elevated M spike noted -Received 1 dose of Zometa for hypercalcemia with improvement -Started on Decadron 4 mg  daily for 5 days and follow-up with oncology -Skeletal survey ordered prior to discharge  3. Anemia: Likely anemia of chronic disease.  Work-up for myeloma being done -Received 2 units transfusion prior to surgery and hemoglobin at 8.5.  No indication for transfusion unless less than 7. -Continue to monitor -On iron supplements  4. CKD: Stage III; avoid nephrotoxic agents.  5.  Upper kalemia-replace  6.  Acute delirium-discontinued narcotics and also avoid Ativan. - improvement noted-will need to sleeping at nighttime, constant reorientation.  Presence of family has been helping -Tylenol and tramadol only for pain as needed. - Patient takes Paxil at home which we will continue at this time. --Urine analysis with minimal bacteria noted.  Urine culture pending and on Cipro - improvement in mental status noted, remains alert, follows commands  7.  DVT prophylaxis-Lovenox for 10 more days to complete a 2-week post surgery Lovenox course.  If increased bleeding noted at her surgical site, can discontinue Lovenox and change to full-strength aspirin  Will be discharged to rehab today    DISCHARGE CONDITIONS:   Critical  CONSULTS OBTAINED:   Treatment Team:  Thornton Park, MD Cammie Sickle, MD  DRUG ALLERGIES:   Allergies  Allergen Reactions  . Codeine Nausea And Vomiting  . Penicillins Swelling and Other (See Comments)    Lips swell Has patient had a PCN reaction causing immediate rash, facial/tongue/throat swelling, SOB or lightheadedness with hypotension: Yes Has patient had a PCN reaction causing severe rash involving mucus membranes or skin necrosis: No Has patient had a PCN reaction that required hospitalization: No Has patient had a PCN reaction occurring within the last 10 years:  No If all of the above answers are "NO", then may proceed with Cephalosporin use.    DISCHARGE MEDICATIONS:   Allergies as of 05/31/2017      Reactions   Codeine Nausea  And Vomiting   Penicillins Swelling, Other (See Comments)   Lips swell Has patient had a PCN reaction causing immediate rash, facial/tongue/throat swelling, SOB or lightheadedness with hypotension: Yes Has patient had a PCN reaction causing severe rash involving mucus membranes or skin necrosis: No Has patient had a PCN reaction that required hospitalization: No Has patient had a PCN reaction occurring within the last 10 years: No If all of the above answers are "NO", then may proceed with Cephalosporin use.      Medication List    TAKE these medications   acetaminophen 325 MG tablet Commonly known as:  TYLENOL Take 2 tablets (650 mg total) by mouth every 6 (six) hours as needed for mild pain, moderate pain or fever.   amLODipine 5 MG tablet Commonly known as:  NORVASC Take 1 tablet (5 mg total) by mouth daily. Start taking on:  06/01/2017   atorvastatin 10 MG tablet Commonly known as:  LIPITOR Take 5 mg by mouth daily.   ciprofloxacin 500 MG tablet Commonly known as:  CIPRO Take 1 tablet (500 mg total) by mouth 2 (two) times daily for 2 days.   dexamethasone 4 MG tablet Commonly known as:  DECADRON Take 1 tablet (4 mg total) by mouth daily for 4 days. Start taking on:  06/01/2017   enoxaparin 30 MG/0.3ML injection Commonly known as:  LOVENOX Inject 0.3 mLs (30 mg total) into the skin daily for 10 days. X 10 more days Start taking on:  06/01/2017   feeding supplement (ENSURE ENLIVE) Liqd Take 237 mLs by mouth 2 (two) times daily between meals.   ferrous sulfate 325 (65 FE) MG tablet Take 1 tablet (325 mg total) by mouth daily with breakfast.   levothyroxine 25 MCG tablet Commonly known as:  SYNTHROID, LEVOTHROID Take 1 tablet (25 mcg total) by mouth daily before breakfast. Start taking on:  06/01/2017   metoprolol tartrate 25 MG tablet Commonly known as:  LOPRESSOR Take 1 tablet (25 mg total) by mouth 2 (two) times daily.   PARoxetine 40 MG tablet Commonly known  as:  PAXIL Take 40 mg by mouth at bedtime.   polyethylene glycol packet Commonly known as:  MIRALAX / GLYCOLAX Take 17 g by mouth daily. Start taking on:  06/01/2017   traMADol 50 MG tablet Commonly known as:  ULTRAM Take 1 tablet (50 mg total) by mouth every 6 (six) hours as needed for moderate pain or severe pain.   traZODone 50 MG tablet Commonly known as:  DESYREL Take 25 mg by mouth at bedtime.   vitamin B-12 1000 MCG tablet Commonly known as:  CYANOCOBALAMIN Take 1,000 mcg by mouth daily.        DISCHARGE INSTRUCTIONS:   1. PCP f/u in 1-2 weeks 2. F/u with oncology in 1 week 3. Orthopedics f/u as scheduled  DIET:   Cardiac diet  ACTIVITY:   Activity as tolerated  OXYGEN:   Home Oxygen: No.  Oxygen Delivery: room air  DISCHARGE LOCATION:   nursing home   If you experience worsening of your admission symptoms, develop shortness of breath, life threatening emergency, suicidal or homicidal thoughts you must seek medical attention immediately by calling 911 or calling your MD immediately  if symptoms less severe.  You Must read complete instructions/literature   along with all the possible adverse reactions/side effects for all the Medicines you take and that have been prescribed to you. Take any new Medicines after you have completely understood and accpet all the possible adverse reactions/side effects.   Please note  You were cared for by a hospitalist during your hospital stay. If you have any questions about your discharge medications or the care you received while you were in the hospital after you are discharged, you can call the unit and asked to speak with the hospitalist on call if the hospitalist that took care of you is not available. Once you are discharged, your primary care physician will handle any further medical issues. Please note that NO REFILLS for any discharge medications will be authorized once you are discharged, as it is imperative that you  return to your primary care physician (or establish a relationship with a primary care physician if you do not have one) for your aftercare needs so that they can reassess your need for medications and monitor your lab values.    On the day of Discharge:  VITAL SIGNS:   Blood pressure (!) 157/82, pulse 71, temperature 98 F (36.7 C), resp. rate 16, height 5' 6" (1.676 m), weight 64.9 kg (143 lb), SpO2 99 %.  PHYSICAL EXAMINATION:   GENERAL:  84 y.o.-year-old elderly patient lying in the bed with no acute distress.  EYES: Pupils equal, round, reactive to light and accommodation. No scleral icterus. Extraocular muscles intact.  HEENT: Head atraumatic, normocephalic. Oropharynx and nasopharynx clear.  NECK:  Supple, no jugular venous distention. No thyroid enlargement, no tenderness.  LUNGS: Normal breath sounds bilaterally, no wheezing, rales,rhonchi or crepitation. No use of accessory muscles of respiration. Decreased bibasilar breath sounds CARDIOVASCULAR: S1, S2 normal. No  rubs, or gallops. 3/6 systolic murmur present ABDOMEN: Soft, nontender, nondistended. Bowel sounds present. No organomegaly or mass.  EXTREMITIES: left thigh dressing in place. No pedal edema, cyanosis, or clubbing.  NEUROLOGIC: Cranial nerves II through XII are intact. Muscle strength 5/5 in all extremities except left leg due to pain from recent surgery. Sensation intact. Gait not checked. Global weakness PSYCHIATRIC: The patient is alert  and following commands.  Oriented to self and person SKIN: No obvious rash, lesion, or ulcer.   DATA REVIEW:   CBC Recent Labs  Lab 05/31/17 0505  WBC 8.5  HGB 8.5*  HCT 25.6*  PLT 276    Chemistries  Recent Labs  Lab 05/26/17 0419  05/30/17 0450  05/31/17 0505  NA 138   < > 137  --  134*  K 4.8   < > 3.0*   < > 4.1  CL 104   < > 104  --  103  CO2 23   < > 23  --  23  GLUCOSE 115*   < > 106*  --  93  BUN 31*   < > 24*  --  19  CREATININE 1.24*   < > 1.02*  --   1.03*  CALCIUM 10.4*  10.1   < > 7.8*  --  7.2*  MG  --    < > 1.8  --   --   AST 53*  --   --   --   --   ALT 26  --   --   --   --   ALKPHOS 75  --   --   --   --   BILITOT 2.1*  --   --   --   --    < > =   values in this interval not displayed.     Microbiology Results  Results for orders placed or performed during the hospital encounter of 05/24/17  Surgical pcr screen     Status: Abnormal   Collection Time: 05/25/17  2:10 AM  Result Value Ref Range Status   MRSA, PCR NEGATIVE NEGATIVE Final   Staphylococcus aureus POSITIVE (A) NEGATIVE Final    Comment: (NOTE) The Xpert SA Assay (FDA approved for NASAL specimens in patients 22 years of age and older), is one component of a comprehensive surveillance program. It is not intended to diagnose infection nor to guide or monitor treatment. Performed at [facility], [address] [place], [place] [address]   Urine Culture     Status: Abnormal   Collection Time: 05/28/17  3:48 PM  Result Value Ref Range Status   Specimen Description   Final    URINE, RANDOM Performed at [facility], [address] [place], [place] [address]    Special Requests   Final    NONE Performed at [facility], [address] , [place] [address]    Culture >=100,000 COLONIES/mL ESCHERICHIA COLI (A)  Final   Report Status 05/31/2017 FINAL  Final   Organism ID, Bacteria ESCHERICHIA COLI (A)  Final      Susceptibility   Escherichia coli - MIC*    AMPICILLIN 4 SENSITIVE Sensitive     CEFAZOLIN <=4 SENSITIVE Sensitive     CEFTRIAXONE <=1 SENSITIVE Sensitive     CIPROFLOXACIN <=0.25 SENSITIVE Sensitive     GENTAMICIN <=1 SENSITIVE Sensitive     IMIPENEM <=0.25 SENSITIVE Sensitive     NITROFURANTOIN <=16 SENSITIVE Sensitive     TRIMETH/SULFA <=20 SENSITIVE Sensitive     AMPICILLIN/SULBACTAM <=2 SENSITIVE Sensitive     PIP/TAZO <=4 SENSITIVE Sensitive     Extended ESBL NEGATIVE Sensitive     * >=100,000  COLONIES/mL ESCHERICHIA COLI    RADIOLOGY:  No results found.   Management plans discussed with the patient, family and they are in agreement.  CODE STATUS:     Code Status Orders  (From admission, onward)        Start     Ordered   05/25/17 1245  Do not attempt resuscitation (DNR)  Continuous    Question Answer Comment  In the event of cardiac or respiratory ARREST Do not call a "code blue"   In the event of cardiac or respiratory ARREST Do not perform Intubation, CPR, defibrillation or ACLS   In the event of cardiac or respiratory ARREST Use medication by any route, position, wound care, and other measures to relive pain and suffering. May use oxygen, suction and manual treatment of airway obstruction as needed for comfort.      05/25/17 1245    Code Status History    Date Active Date Inactive Code Status Order ID Comments User Context   05/25/2017 0157 05/25/2017 1245 Full Code [id number]  Diamond, Michael S, MD Inpatient    Advance Directive Documentation     Most Recent Value  Type of Advance Directive  Living will  Pre-existing out of facility DNR order (yellow form or pink MOST form)  -  "MOST" Form in Place?  -      TOTAL TIME TAKING CARE OF THIS PATIENT: 36 minutes.    , M.D on 05/31/2017 at 2:23 PM  Between 7am to 6pm - Pager - [phone]  After 6pm go to [email or web address] - password [email or web address]  [employer]   [facility]  Office  [phone]  CC: Primary care physician; [name], MD   Note: This dictation was prepared with Dragon dictation along with smaller phrase technology. Any transcriptional errors that result from this process are unintentional.

## 2017-05-31 NOTE — Progress Notes (Signed)
Patient is medically stable for D/C to [facility] today. Per [facility] admissions coordinator at [facility] patient can come today to room 506. RN will call report and arrange EMS for transport. Clinical Education officer, museum (CSW) sent D/C orders to [facility] via [place]. Patient is aware of above. Patient's daughter [name] is at bedside and aware of above. Please reconsult if future social work needs arise. CSW signing off.   [employer], LCSW [phone]

## 2017-05-31 NOTE — Progress Notes (Signed)
Awake on rounds. Ext catheter in place and working approply. No apparent distress. Bed alarm on, call bell in reach.

## 2017-05-31 NOTE — Clinical Social Work Placement (Signed)
   CLINICAL SOCIAL WORK PLACEMENT  NOTE  Date:  05/31/2017  Patient Details  Name: Theresa Lynch MRN: [id number] Date of Birth: 1929-02-21  Clinical Social Work is seeking post-discharge placement for this patient at the [place] level of care (*CSW will initial, date and re-position this form in  chart as items are completed):  Yes   Patient/family provided with [facility] Work Department's list of facilities offering this level of care within the geographic area requested by the patient (or if unable, by the patient's family).  Yes   Patient/family informed of their freedom to choose among providers that offer the needed level of care, that participate in Medicare, Medicaid or managed care program needed by the patient, have an available bed and are willing to accept the patient.  Yes   Patient/family informed of [facility]'s ownership interest in [facility] and [facility], as well as of the fact that they are under no obligation to receive care at these facilities.  PASRR submitted to EDS on 05/25/17     PASRR number received on 05/25/17     Existing PASRR number confirmed on       FL2 transmitted to all facilities in geographic area requested by pt/family on 05/25/17     FL2 transmitted to all facilities within larger geographic area on       Patient informed that his/her managed care company has contracts with or will negotiate with certain facilities, including the following:        Yes   Patient/family informed of bed offers received.  Patient chooses bed at [facility] )     Physician recommends and patient chooses bed at      Patient to be transferred to [facility] ) on 05/31/17.  Patient to be transferred to facility by [facility] )     Patient family notified on 05/31/17 of transfer.  Name of family member notified:  (Patient's daughter [name] is at bedside and aware of D/C today. )      PHYSICIAN       Additional Comment:    _______________________________________________ Sayda Grable, Veronia Beets, LCSW 05/31/2017, 3:02 PM

## 2017-05-31 NOTE — Progress Notes (Signed)
Physical Therapy Treatment Patient Details Name: Theresa Lynch MRN: [id number] DOB: 08/25/1929 Today's Date: 05/31/2017    History of Present Illness Patient is an 84 y/o female that presents s/p fall and L hip fracture, IM nailing performed to repair hip. Of note she was noted to have abnormalities on CT scan, concern is for multiple myeloma and is being worked up by oncology team.     PT Comments    Pt presents with deficits in strength, transfers, mobility, gait, balance, and activity tolerance.  Pt continues to require extensive assistance with bed mobility and transfers but was able to follow commands this session with slightly decreased cueing.  Pt remains limited with standing/amb secondary to pain in the LLE with WB.  Pt required extensive encouragement and cues to increase weight on the LLE while in standing and at first would only put her toes on the floor.  Pt eventually was able to take 1-2 very small steps with assistance with poor clearance while advancing the RLE.  Pt will benefit from PT services in a SNF setting upon discharge to safely address above deficits for decreased caregiver assistance and eventual return to PLOF.      Follow Up Recommendations  SNF     Equipment Recommendations       Recommendations for Other Services       Precautions / Restrictions Precautions Precautions: Fall Restrictions Weight Bearing Restrictions: Yes LLE Weight Bearing: Weight bearing as tolerated    Mobility  Bed Mobility Overal bed mobility: Needs Assistance Bed Mobility: Sit to Supine;Supine to Sit     Supine to sit: Mod assist Sit to supine: +2 for physical assistance;Max assist   General bed mobility comments: Min verbal and tactile cues for sequencing with bed mobility tasks  Transfers Overall transfer level: Needs assistance Equipment used: Rolling walker (2 wheeled) Transfers: Sit to/from Stand Sit to Stand: Mod assist;From elevated surface;+2 physical  assistance         General transfer comment: Mod verbal and tactile cues for sequencing   Ambulation/Gait Ambulation/Gait assistance: Min assist Ambulation Distance (Feet): 1 Feet Assistive device: Rolling walker (2 wheeled) Gait Pattern/deviations: Antalgic;Decreased stance time - left;Decreased step length - right;Step-to pattern     General Gait Details: Pt able to advance LE this session but only able to advance RLE with great effort and with limited clearance secondary to LLE pain in WB   Stairs             Wheelchair Mobility    Modified Rankin (Stroke Patients Only)       Balance Overall balance assessment: Needs assistance Sitting-balance support: Bilateral upper extremity supported;Feet supported Sitting balance-Leahy Scale: Fair Sitting balance - Comments: Pt able to maintain static sitting balance with physical assistance initially but progressed to Ind sitting during session   Standing balance support: Bilateral upper extremity supported Standing balance-Leahy Scale: Poor                              Cognition Arousal/Alertness: Lethargic Behavior During Therapy: WFL for tasks assessed/performed Overall Cognitive Status: Impaired/Different from baseline                                 General Comments: Pt able to follow commands with decreased cueing this session      Exercises Total Joint Exercises Ankle Circles/Pumps: Strengthening;Both;10 reps;15 reps Quad Sets:  Strengthening;Both;5 reps;10 reps Gluteal Sets: Strengthening;Both;10 reps Short Arc Quad: AROM;Left;5 reps;10 reps Heel Slides: AAROM;Left;5 reps Hip ABduction/ADduction: AAROM;Both;10 reps;AROM;5 reps(AAROM on the LLE) Straight Leg Raises: AAROM;Both;10 reps;AROM;5 reps(AAROM on the LLE) Long Arc Quad: 10 reps;AROM;Both Knee Flexion: 10 reps;AROM;Both Other Exercises Other Exercises: Log roll technique training with sup to/from sit Other Exercises:  Anterior weight shifting activities in sitting    General Comments        Pertinent Vitals/Pain Pain Assessment: 0-10 Pain Score: 5  Pain Location: L hip Pain Intervention(s): Premedicated before session;Monitored during session;Limited activity within patient's tolerance    Home Living                      Prior Function            PT Goals (current goals can now be found in the care plan section)      Frequency    BID      PT Plan Current plan remains appropriate    Co-evaluation              AM-PAC PT "6 Clicks" Daily Activity  Outcome Measure                   End of Session Equipment Utilized During Treatment: Gait belt Activity Tolerance: Patient limited by pain Patient left: in bed;with call bell/phone within reach;with family/visitor present;with bed alarm set Nurse Communication: Mobility status PT Visit Diagnosis: Unsteadiness on feet (R26.81);Muscle weakness (generalized) (M62.81);History of falling (Z91.81)     Time: 0254-2706 PT Time Calculation (min) (ACUTE ONLY): 24 min  Charges:  $Therapeutic Exercise: 8-22 mins $Therapeutic Activity: 8-22 mins                    G Codes:       D. Royetta Asal PT, DPT 05/31/17, 11:25 AM

## 2017-06-01 ENCOUNTER — Telehealth: Payer: Self-pay | Admitting: Internal Medicine

## 2017-06-01 DIAGNOSIS — F325 Major depressive disorder, single episode, in full remission: Secondary | ICD-10-CM | POA: Diagnosis not present

## 2017-06-01 DIAGNOSIS — M8000XD Age-related osteoporosis with current pathological fracture, unspecified site, subsequent encounter for fracture with routine healing: Secondary | ICD-10-CM | POA: Insufficient documentation

## 2017-06-01 DIAGNOSIS — C9000 Multiple myeloma not having achieved remission: Secondary | ICD-10-CM | POA: Diagnosis not present

## 2017-06-01 DIAGNOSIS — D72829 Elevated white blood cell count, unspecified: Secondary | ICD-10-CM

## 2017-06-01 NOTE — Telephone Encounter (Signed)
Orders faxed to [facility]- and I also spoke with nurse.

## 2017-06-01 NOTE — Addendum Note (Signed)
Addended by: Sabino Gasser on: 06/01/2017 04:53 PM   Modules accepted: Orders

## 2017-06-01 NOTE — Progress Notes (Signed)
Theresa Lynch   DOB:[birth year]   EK#:[id number]    Subjective: Patient is confused; but alert.  She is accompanied by family.  As per family-she has been working with physical therapy; has not been complaining of much pain. Foley catheter is taken out.  Awaiting discharge to rehab today.  ROS: Difficult to assess given patient's mentation  Objective:  Vitals:   05/31/17 0944 05/31/17 1637  BP: (!) 157/82 (!) 150/79  Pulse: 71 73  Resp:  15  Temp:  98.2 F (36.8 C)  SpO2:  99%     Intake/Output Summary (Last 24 hours) at 06/01/2017 0759 Last data filed at 05/31/2017 1640 Gross per 24 hour  Intake 120 ml  Output 550 ml  Net -430 ml    GENERAL: Moderately built moderately nourished female patient drowsy but confused.  No distress and comfortable.     She is accompanied by family.   EYES: Positive for pallor. OROPHARYNX: no thrush or ulceration. NECK: supple, no masses felt LYMPH:  no palpable lymphadenopathy in the cervical, axillary or inguinal regions LUNGS: decreased breath sounds to auscultation at bases and  No wheeze or crackles HEART/CVS: regular rate & rhythm and no murmurs; No lower extremity edema ABDOMEN: abdomen soft, non-tender and normal bowel sounds Musculoskeletal:no cyanosis of digits and no clubbing  PSYCH: drowsy/ arousable; Oriented x0.  NEURO: no focal motor/sensory deficits SKIN:  no rashes or significant lesions    Labs:  Lab Results  Component Value Date   WBC 8.5 05/31/2017   HGB 8.5 (L) 05/31/2017   HCT 25.6 (L) 05/31/2017   MCV 97.5 05/31/2017   PLT 276 05/31/2017   NEUTROABS 3.7 05/24/2017    Lab Results  Component Value Date   NA 134 (L) 05/31/2017   K 4.1 05/31/2017   CL 103 05/31/2017   CO2 23 05/31/2017    Studies:  Dg Bone Survey Met  Result Date: 05/31/2017 CLINICAL DATA:  Multiple myeloma EXAM: METASTATIC BONE SURVEY COMPARISON:  None. FINDINGS: Chest: Normal cardiomediastinal contours. No focal airspace consolidation or  pulmonary edema. No osseous lesions of the ribs or clavicles. Spine: The bones are generally osteopenic. No discrete lytic lesion is identified. Skull: Extensive mottled lucency throughout the skull with multiple well-circumscribed rounded lesions. Appendicular skeleton: Heterogeneous lucency within both humeri and radii is probably due to underlying osteopenia. No discrete lytic lesion. There is an antegrade intramedullary nail within the left femur. There are fractures of the left superior and inferior pubic rami, unchanged from 05/24/2017. Heterogeneous lucency throughout the right femur, greatest proximally. IMPRESSION: 1. Multiple rounded lytic lesions in the skull superimposed on a background of diffusely mottled lucency. 2. No spinal lytic lesions. 3. Heterogeneous lucency within the proximal humeri, radii and femora the is favored to be due to underlying osteopenia. No wall circumscribed lytic lesion identified in the appendicular skeleton. Electronically Signed   By: Ulyses Jarred M.D.   On: 05/31/2017 14:46    Assessment & Plan:  # 84 year old female patient with a history of mild to moderate dementia-admitted the hospital for left hip fracture/and anemia  #Active multiple myeloma-IgG kappa 3.4 g/dL.  New diagnosis [meets crab criteria]; given her age comorbidities; I do not recommend a bone marrow biospy. Plan to start the patient on dexamethasone 4 mg once a day for 5 days [20 mg a week].  Discussed this might help her energy levels/improve her appetite.  And also treat the underlying multiple myeloma.  Also discussed the downside of worsening delirium/confusion.  #  Hypercalcemia-status post Zometa 3 mg/given renal insufficiency; calcium is 7.5/low albumin.  Plan starting calcium and vitamin D  # Mild to moderate dementia/currently delirious post surgery-.  Improving.   #Left hip fracture-question pathologic versus status post fall.    Awaiting discharge to nursing home.   #Met with  patient's son Mark/daughter Max [facility]ane [facility] #[phone]/[facility] phone]; Long discussion regarding overall incurable nature of the disease/poor prognosis-especially in the context of mild to moderate dementia/other comorbidities/recent hip fracture etc. Also discussed with Dr.Kalisetti.   # 40 minutes face-to-face with the patient/pt's family discussing the above plan of care; more than 50% of time spent on prognosis/ natural history; counseling and coordination.   Cammie Sickle, MD 06/01/2017  7:59 AM

## 2017-06-01 NOTE — Telephone Encounter (Signed)
Please inform [facility]- the patient needs to be on calcium [500] plus vitamin D [800] twice a day;  #  Also needs to have a CBC/CMP drawn in one week/labs sent over to [place].  # follow-up with me in 2 weeks CBC CMP/please order [patient's daughter Karren Cobble be a good point of contact for patient for appts etc]

## 2017-06-13 DIAGNOSIS — M25551 Pain in right hip: Secondary | ICD-10-CM | POA: Diagnosis not present

## 2017-06-13 DIAGNOSIS — M8000XD Age-related osteoporosis with current pathological fracture, unspecified site, subsequent encounter for fracture with routine healing: Secondary | ICD-10-CM | POA: Diagnosis not present

## 2017-06-13 DIAGNOSIS — C9000 Multiple myeloma not having achieved remission: Secondary | ICD-10-CM | POA: Diagnosis not present

## 2017-06-13 DIAGNOSIS — I10 Essential (primary) hypertension: Secondary | ICD-10-CM | POA: Diagnosis not present

## 2017-06-13 DIAGNOSIS — E7800 Pure hypercholesterolemia, unspecified: Secondary | ICD-10-CM | POA: Diagnosis not present

## 2017-06-15 ENCOUNTER — Other Ambulatory Visit: Payer: Self-pay

## 2017-06-15 ENCOUNTER — Inpatient Hospital Stay: Payer: PPO | Admitting: Internal Medicine

## 2017-06-15 ENCOUNTER — Inpatient Hospital Stay: Payer: PPO | Attending: Internal Medicine

## 2017-06-15 ENCOUNTER — Encounter: Payer: Self-pay | Admitting: *Deleted

## 2017-06-15 ENCOUNTER — Telehealth: Payer: Self-pay | Admitting: *Deleted

## 2017-06-15 DIAGNOSIS — F0390 Unspecified dementia without behavioral disturbance: Secondary | ICD-10-CM | POA: Insufficient documentation

## 2017-06-15 DIAGNOSIS — E8809 Other disorders of plasma-protein metabolism, not elsewhere classified: Secondary | ICD-10-CM | POA: Diagnosis not present

## 2017-06-15 DIAGNOSIS — R634 Abnormal weight loss: Secondary | ICD-10-CM | POA: Diagnosis not present

## 2017-06-15 DIAGNOSIS — K5900 Constipation, unspecified: Secondary | ICD-10-CM | POA: Insufficient documentation

## 2017-06-15 DIAGNOSIS — I10 Essential (primary) hypertension: Secondary | ICD-10-CM

## 2017-06-15 DIAGNOSIS — F329 Major depressive disorder, single episode, unspecified: Secondary | ICD-10-CM | POA: Diagnosis not present

## 2017-06-15 DIAGNOSIS — R5383 Other fatigue: Secondary | ICD-10-CM | POA: Diagnosis not present

## 2017-06-15 DIAGNOSIS — D649 Anemia, unspecified: Secondary | ICD-10-CM | POA: Diagnosis not present

## 2017-06-15 DIAGNOSIS — Z79899 Other long term (current) drug therapy: Secondary | ICD-10-CM | POA: Diagnosis not present

## 2017-06-15 DIAGNOSIS — D72829 Elevated white blood cell count, unspecified: Secondary | ICD-10-CM

## 2017-06-15 DIAGNOSIS — C9002 Multiple myeloma in relapse: Secondary | ICD-10-CM | POA: Diagnosis not present

## 2017-06-15 LAB — CBC WITH DIFFERENTIAL/PLATELET
BASOS ABS: 0.1 10*3/uL (ref 0–0.1)
Basophils Relative: 1 %
EOS PCT: 0 %
Eosinophils Absolute: 0 10*3/uL (ref 0–0.7)
HCT: 26.2 % — ABNORMAL LOW (ref 35.0–47.0)
Hemoglobin: 8.8 g/dL — ABNORMAL LOW (ref 12.0–16.0)
Lymphocytes Relative: 18 %
Lymphs Abs: 0.9 10*3/uL — ABNORMAL LOW (ref 1.0–3.6)
MCH: 32.5 pg (ref 26.0–34.0)
MCHC: 33.4 g/dL (ref 32.0–36.0)
MCV: 97.3 fL (ref 80.0–100.0)
MONO ABS: 0.4 10*3/uL (ref 0.2–0.9)
Monocytes Relative: 7 %
Neutro Abs: 3.9 10*3/uL (ref 1.4–6.5)
Neutrophils Relative %: 74 %
PLATELETS: 287 10*3/uL (ref 150–440)
RBC: 2.69 MIL/uL — ABNORMAL LOW (ref 3.80–5.20)
RDW: 19.6 % — AB (ref 11.5–14.5)
WBC: 5.3 10*3/uL (ref 3.6–11.0)

## 2017-06-15 LAB — COMPREHENSIVE METABOLIC PANEL
ALT: 11 U/L — ABNORMAL LOW (ref 14–54)
AST: 21 U/L (ref 15–41)
Albumin: 2.4 g/dL — ABNORMAL LOW (ref 3.5–5.0)
Alkaline Phosphatase: 134 U/L — ABNORMAL HIGH (ref 38–126)
Anion gap: 12 (ref 5–15)
BUN: 14 mg/dL (ref 6–20)
CO2: 23 mmol/L (ref 22–32)
Calcium: 8.2 mg/dL — ABNORMAL LOW (ref 8.9–10.3)
Chloride: 100 mmol/L — ABNORMAL LOW (ref 101–111)
Creatinine, Ser: 1.05 mg/dL — ABNORMAL HIGH (ref 0.44–1.00)
GFR calc Af Amer: 54 mL/min — ABNORMAL LOW (ref >60)
GFR calc non Af Amer: 47 mL/min — ABNORMAL LOW (ref >60)
Glucose, Bld: 189 mg/dL — ABNORMAL HIGH (ref 65–99)
Potassium: 4.2 mmol/L (ref 3.5–5.1)
Sodium: 135 mmol/L (ref 135–145)
Total Bilirubin: 0.8 mg/dL (ref 0.3–1.2)
Total Protein: 8.8 g/dL — ABNORMAL HIGH (ref 6.5–8.1)

## 2017-06-15 MED ORDER — LENALIDOMIDE 5 MG PO CAPS: ORAL_CAPSULE | ORAL | 4 refills | Status: DC

## 2017-06-15 NOTE — Assessment & Plan Note (Addendum)
#  Multiple myeloma -IgA kappa; symptomatic.  Stage II.  Long discussion the patient and family regarding the diagnosis and overall prognosis; and the fact that this is incurable disease. Discussed the role of bone marrow biopsy- but I dont think this is going to change pt's management. Bone Biopsy was not submitted to pathology; discussed with Dr.RUbinas.   #Discussed the treatments are palliative/control will disease and not curative.  Given her advanced age/borderline performance status/mild to moderate dementia-I would recommend starting dexamethasone 4 mg Monday Tuesday Wednesday [3 times a week]; and then add lenalidomide 5 mg 2 weeks on 1 week off.   #Discussed the potential side effects of steroids; and also discussed the potential side effects of Revlimid-including but not limited to teratogenicity; diarrhea skin rash blood clots.  Patient on aspirin.  #Anemia hemoglobin 8.8; second multiple myeloma; plan treatment above.  Stable  #Constipation-NEW/worsened discontinue iron; recommend continued MiraLAX stool softeners.  #Hypercalcemia-status post Zometa in the hospital; currently calcium 8.2.  Monitor closely especially with hypoalbuminemia.  Recommend Zometa if calcium is elevated again.  Improved  # dementia- STABLE; Delirium- improved.    # follow up June 10th/labs; discussed with oral pharmacy; plan to start at that visit.   # 40 minutes face-to-face with the patient discussing the above plan of care; more than 50% of time spent on prognosis/ natural history; counseling and coordination.

## 2017-06-15 NOTE — Telephone Encounter (Signed)
Contacted patient's daughter- susan. MD plans to start patient on Revlmid. Given pt's dementia, pt will most likely not be able to complete REMS survey/paperwork.  I left a vm for the patient's daughter [name] to further discuss.

## 2017-06-15 NOTE — Progress Notes (Signed)
[facility] CONSULT NOTE  Patient Care Team: Theresa Body, MD as PCP - General (Family Medicine)  CHIEF COMPLAINTS/PURPOSE OF CONSULTATION: MULTIPLE MYELOMA #  Oncology History   # MAY 2019- MULTIPLE MYELOMA [No BMBx]; IgA-K [baseline- 3.4 gm/dl; ]  # Hypercalcemia- s/p Zometa 82m IVP [May 2019]  # Mod dementia; s/p Left hip Fx [may 2019]  -------------------------------------------------------------------------   DIAGNOSIS: [MAY 2019]- ACTIVE MULTIPLE MYELOMA  STAGE:  II  ; GOALS: PALLIATIVE  CURRENT/MOST RECENT THERAPY '[ ]'       Multiple myeloma in relapse ([facility]     HISTORY OF PRESENTING ILLNESS:  Theresa Brunn88y.o.  female mild to moderate dementia with newly diagnosed multiple myeloma is here for follow-up.  Patient was recently seen in the hospital for left hip fracture status post ORIF; patient is currently rehab.  During the hospitalizations patient was noted to have anemia/mild renal insufficiency creatinine up to 1.5/hypercalcemia/multiple lytic bone lesions in the skull.  Given the delirium in the hospital patient was started on steroids at the nursing home/at the time of discharge.  Patient recently has staples taken out.  At the nursing note patient seems to be doing better delirium improved; using a walker but working with physical therapy.  Review of Systems  Constitutional: Positive for malaise/fatigue and weight loss. Negative for chills, diaphoresis and fever.  HENT: Negative for nosebleeds and sore throat.   Eyes: Negative for double vision.  Respiratory: Negative for cough, hemoptysis, sputum production, shortness of breath and wheezing.   Cardiovascular: Negative for chest pain, palpitations, orthopnea and leg swelling.  Gastrointestinal: Negative for abdominal pain, blood in stool, constipation, diarrhea, heartburn, melena, nausea and vomiting.  Genitourinary: Negative for dysuria, frequency and urgency.  Musculoskeletal:  Positive for back pain and joint pain.  Skin: Negative.  Negative for itching and rash.  Neurological: Positive for dizziness. Negative for tingling, focal weakness, weakness and headaches.  Endo/Heme/Allergies: Does not bruise/bleed easily.  Psychiatric/Behavioral: Negative for depression. The patient is not nervous/anxious and does not have insomnia.      MEDICAL HISTORY:  Past Medical History:  Diagnosis Date  . Arthritis    hands  . Cataract   . Claustrophobia   . Depression    with anxiety/h/o agoraphobia  . Glaucoma   . Hip fracture ([facility]    left hip  . HOH (hard of hearing)   . Hyperlipidemia   . Hypertension   . Multiple myeloma ([facility]   . PONV (postoperative nausea and vomiting)     SURGICAL HISTORY: Past Surgical History:  Procedure Laterality Date  . CATARACT EXTRACTION W/PHACO Right 03/15/2015   Procedure: CATARACT EXTRACTION PHACO AND INTRAOCULAR LENS PLACEMENT (IOC);  Surgeon: ARonnell Freshwater MD;  Location: [facility]  Service: Ophthalmology;  Laterality: Right;  . INTRAMEDULLARY (IM) NAIL INTERTROCHANTERIC Left 05/25/2017   Procedure: INTRAMEDULLARY (IM) NAIL INTERTROCHANTRIC;  Surgeon: KThornton Park MD;  Location: [facility];  Service: Orthopedics;  Laterality: Left;  . ROTATOR CUFF REPAIR  1999   bilateral    SOCIAL HISTORY: Social History   Socioeconomic History  . Marital status: Married    Spouse name: Not on file  . Number of children: Not on file  . Years of education: Not on file  . Highest education level: Not on file  Occupational History  . Not on file  Social Needs  . Financial resource strain: Not on file  . Food insecurity:    Worry: Not on file    Inability:  Not on file  . Transportation needs:    Medical: Not on file    Non-medical: Not on file  Tobacco Use  . Smoking status: Never Smoker  . Smokeless tobacco: Never Used  Substance and Sexual Activity  . Alcohol use: Yes    Alcohol/week: 4.2 oz    Types: 7  Glasses of wine per week  . Drug use: No  . Sexual activity: Not Currently  Lifestyle  . Physical activity:    Days per week: Not on file    Minutes per session: Not on file  . Stress: Not on file  Relationships  . Social connections:    Talks on phone: Not on file    Gets together: Not on file    Attends religious service: Not on file    Active member of club or organization: Not on file    Attends meetings of clubs or organizations: Not on file    Relationship status: Not on file  . Intimate partner violence:    Fear of current or ex partner: Not on file    Emotionally abused: Not on file    Physically abused: Not on file    Forced sexual activity: Not on file  Other Topics Concern  . Not on file  Social History Narrative  . Not on file    FAMILY HISTORY: Family History  Problem Relation Age of Onset  . Cancer - Other Son        appendix cancer  . Prostate cancer Brother   . CAD Brother   . Kidney cancer Father   . Breast cancer Sister   . Stroke Sister     ALLERGIES:  is allergic to codeine and penicillins.  MEDICATIONS:  Current Outpatient Medications  Medication Sig Dispense Refill  . acetaminophen (TYLENOL) 325 MG tablet Take 2 tablets (650 mg total) by mouth every 6 (six) hours as needed for mild pain, moderate pain or fever. 30 tablet 2  . amLODipine (NORVASC) 5 MG tablet Take 1 tablet (5 mg total) by mouth daily. 30 tablet 0  . ATORVASTATIN CALCIUM PO Take 5 mg by mouth daily.     . calcium gluconate 500 MG tablet Take 1 tablet by mouth 2 (two) times daily.    . Cholecalciferol (VITAMIN D) 2000 units tablet Take 1 tablet by mouth daily.    . ferrous sulfate 325 (65 FE) MG tablet Take 1 tablet (325 mg total) by mouth daily with breakfast. 30 tablet 1  . levothyroxine (SYNTHROID, LEVOTHROID) 25 MCG tablet Take 1 tablet (25 mcg total) by mouth daily before breakfast. 30 tablet 1  . metoprolol tartrate (LOPRESSOR) 25 MG tablet Take 1 tablet (25 mg total) by  mouth 2 (two) times daily. 60 tablet 1  . PARoxetine (PAXIL) 40 MG tablet Take 40 mg by mouth at bedtime.    . polyethylene glycol (MIRALAX / GLYCOLAX) packet Take 17 g by mouth daily. 14 each 0  . senna-docusate (SENOKOT-S) 8.6-50 MG tablet Take 2 tablets by mouth at bedtime as needed for mild constipation.     . traMADol (ULTRAM) 50 MG tablet Take 1 tablet (50 mg total) by mouth every 6 (six) hours as needed for moderate pain or severe pain. 30 tablet 0  . traZODone (DESYREL) 50 MG tablet Take 25 mg by mouth at bedtime.     . vitamin B-12 (CYANOCOBALAMIN) 1000 MCG tablet Take 1,000 mcg by mouth daily.    . feeding supplement, ENSURE ENLIVE, (ENSURE ENLIVE) LIQD  Take 237 mLs by mouth 2 (two) times daily between meals. (Patient not taking: Reported on 06/15/2017) 237 mL 12  . lenalidomide (REVLIMID) 5 MG capsule 2 weeks-ON and 1 week OFF 14 capsule 4   No current facility-administered medications for this visit.       Marland Kitchen  PHYSICAL EXAMINATION: ECOG PERFORMANCE STATUS: 2 - Symptomatic, <50% confined to bed  Vitals:   06/15/17 1345  BP: 107/69  Pulse: 70  Resp: 18  Temp: 97.6 F (36.4 C)   Filed Weights   06/15/17 1355  Weight: 112 lb (50.8 kg)   .GENERAL: Theresa Lynch Caucasian female patient alert, no distress and comfortable.  Accompanied by family.  She is in a wheelchair. EYES: no pallor or icterus OROPHARYNX: no thrush or ulceration; NECK: supple; no lymph nodes felt. LYMPH:  no palpable lymphadenopathy in the axillary or inguinal regions LUNGS: Decreased breath sounds auscultation bilaterally. No wheeze or crackles HEART/CVS: regular rate & rhythm and no murmurs; No lower extremity edema ABDOMEN:abdomen soft, non-tender and normal bowel sounds. No hepatomegaly or splenomegaly.  Musculoskeletal:no cyanosis of digits and no clubbing  PSYCH: alert & oriented x 2-3. NEURO: no focal motor/sensory deficits SKIN:  no rashes or significant lesions  LABORATORY DATA:  I have  reviewed the data as listed Lab Results  Component Value Date   WBC 5.3 06/15/2017   HGB 8.8 (L) 06/15/2017   HCT 26.2 (L) 06/15/2017   MCV 97.3 06/15/2017   PLT 287 06/15/2017   Recent Labs    02/10/17 1451  05/26/17 0419  05/30/17 0450 05/30/17 1614 05/31/17 0505 06/15/17 1329  NA 137   < > 138   < > 137  --  134* 135  K 3.7   < > 4.8   < > 3.0* 4.6 4.1 4.2  CL 99*   < > 104   < > 104  --  103 100*  CO2 25   < > 23   < > 23  --  23 23  GLUCOSE 107*   < > 115*   < > 106*  --  93 189*  BUN 19   < > 31*   < > 24*  --  19 14  CREATININE 1.40*   < > 1.24*   < > 1.02*  --  1.03* 1.05*  CALCIUM 9.5   < > 10.4*  10.1   < > 7.8*  --  7.2* 8.2*  GFRNONAA 33*   < > 38*   < > 49*  --  48* 47*  GFRAA 39*   < > 45*   < > 56*  --  56* 54*  PROT 10.2*  --  8.5*  --   --   --   --  8.8*  ALBUMIN 2.9*  --  2.4*  --   --   --   --  2.4*  AST 18  --  53*  --   --   --   --  21  ALT 9*  --  26  --   --   --   --  11*  ALKPHOS 91  --  75  --   --   --   --  134*  BILITOT 1.5*  --  2.1*  --   --   --   --  0.8   < > = values in this interval not displayed.    RADIOGRAPHIC STUDIES: I have personally reviewed the radiological images as listed  and agreed with the findings in the report. Dg Chest 1 View  Result Date: 05/24/2017 CLINICAL DATA:  Dizzy, hip fracture EXAM: CHEST  1 VIEW COMPARISON:  Report 02/29/2016 FINDINGS: Hyperinflation. Borderline to mild cardiomegaly. No acute opacity or effusion. No pneumothorax. Postsurgical or posttraumatic deformity of the distal left clavicle. Prominent paratracheal opacity. IMPRESSION: 1. No radiographic evidence for acute cardiopulmonary abnormality 2. Borderline to mild cardiomegaly 3. Paratracheal opacities, possible thyroid enlargement. Electronically Signed   By: Donavan Foil M.D.   On: 05/24/2017 22:48   Ct Head Wo Contrast  Result Date: 05/24/2017 CLINICAL DATA:  Dizziness EXAM: CT HEAD WITHOUT CONTRAST TECHNIQUE: Contiguous axial images were  obtained from the base of the skull through the vertex without intravenous contrast. COMPARISON:  MRI brain dated 02/07/2006 FINDINGS: Brain: No evidence of acute infarction, hemorrhage, hydrocephalus, extra-axial collection or mass lesion/mass effect. 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 Ex vacuo dilatation of the occipital horn of the left lateral ventricle. Global cortical atrophy. Subcortical white matter and periventricular small vessel ischemic changes. Vascular: No hyperdense vessel or unexpected calcification. Skull: Innumerable lytic lesions throughout the calvarium. Sinuses/Orbits: Partial opacification of the left frontal sinus. Mastoid air cells are clear. Other: None. IMPRESSION: Innumerable lytic lesions throughout the calvarium. Correlate for multiple myeloma, lytic metastases, or hyperparathyroidism. No evidence of acute intracranial abnormality. 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 Atrophy with small vessel ischemic changes. Electronically Signed   By: Julian Hy M.D.   On: 05/24/2017 23:00   Dg Bone Survey Met  Result Date: 05/31/2017 CLINICAL DATA:  Multiple myeloma EXAM: METASTATIC BONE SURVEY COMPARISON:  None. FINDINGS: Chest: Normal cardiomediastinal contours. No focal airspace consolidation or pulmonary edema. No osseous lesions of the ribs or clavicles. Spine: The bones are generally osteopenic. No discrete lytic lesion is identified. Skull: Extensive mottled lucency throughout the skull with multiple well-circumscribed rounded lesions. Appendicular skeleton: Heterogeneous lucency within both humeri and radii is probably due to underlying osteopenia. No discrete lytic lesion. There is an antegrade intramedullary nail within the left femur. There are fractures of the left superior and inferior pubic rami, unchanged from 05/24/2017. Heterogeneous lucency throughout the right femur, greatest proximally. IMPRESSION: 1. Multiple  rounded lytic lesions in the skull superimposed on a background of diffusely mottled lucency. 2. No spinal lytic lesions. 3. Heterogeneous lucency within the proximal humeri, radii and femora the is favored to be due to underlying osteopenia. No wall circumscribed lytic lesion identified in the appendicular skeleton. Electronically Signed   By: Ulyses Jarred M.D.   On: 05/31/2017 14:46   Dg Hip Operative Unilat W Or W/o Pelvis Left  Result Date: 05/25/2017 CLINICAL DATA:  OPEN REDUCTION INTERNAL FIXATION FOR FRACTURE EXAM: OPERATIVE LEFT HIP  2 VIEWS TECHNIQUE: Fluoroscopic spot image(s) were submitted for interpretation post-operatively. FLUOROSCOPY TIME:  1 MINUTES 6 SECONDS; 4 ACQUIRED IMAGES COMPARISON:  Pelvis and left hip images May 24, 2017 FINDINGS: Frontal and lateral views were obtained. There is screw and rod fixation through an intertrochanteric femur fracture on the left. Alignment at the fracture site is essentially anatomic. The tip of the screw is in the proximal femoral head. No dislocation. There is mild narrowing of the left hip joint. IMPRESSION: Postoperative fixation of intertrochanteric femur fracture on the left with alignment essentially anatomic at the fracture site. Screw tip in proximal femoral head. No dislocation. Mild narrowing left hip joint. Electronically Signed   By: Lowella Grip III M.D.   On: 05/25/2017 15:06  Dg Hip Unilat W Or Wo Pelvis 2-3 Views Left  Result Date: 05/24/2017 CLINICAL DATA:  Fall with left hip pain EXAM: DG HIP (WITH OR WITHOUT PELVIS) 2-3V LEFT COMPARISON:  None. FINDINGS: Osteopenia. SI joints non widened. Pubic symphysis is intact. Both femoral heads project in joint. Acute intertrochanteric fracture of the proximal left femur with minimal posterior angulation. Additional acute minimally displaced fractures involving the left superior and inferior pubic rami. IMPRESSION: 1. Acute left intertrochanteric fracture of the femur 2. Suspected acute  fractures of the left superior and inferior pubic rami. Electronically Signed   By: Donavan Foil M.D.   On: 05/24/2017 22:46   Dg Femur Min 2 Views Left  Result Date: 05/25/2017 CLINICAL DATA:  Left IM nail. EXAM: LEFT FEMUR 2 VIEWS COMPARISON:  No recent prior. FINDINGS: ORIF left femur. Hardware intact. Anatomic alignment. No acute bony abnormality. IMPRESSION: ORIF left femur with anatomic alignment. Electronically Signed   By: Marcello Moores  Register   On: 05/25/2017 16:19   US Abdomen Limited Ruq  Result Date: 05/25/2017 CLINICAL DATA:  Elevated INR. EXAM: ULTRASOUND ABDOMEN LIMITED RIGHT UPPER QUADRANT COMPARISON:  None. FINDINGS: Gallbladder: No gallstones or wall thickening visualized. No sonographic Murphy sign noted by sonographer. Common bile duct: Diameter: 3.5 mm Liver: No focal lesion identified. Within normal limits in parenchymal echogenicity. Portal vein is patent on color Doppler imaging with normal direction of blood flow towards the liver. Additional finding: Small right pleural effusion. IMPRESSION: Small right pleural effusion.  Otherwise, normal examination. Electronically Signed   By: Claudie Revering M.D.   On: 05/25/2017 12:06    ASSESSMENT & PLAN:   Multiple myeloma in relapse ([place]) #Multiple myeloma -IgA kappa; symptomatic.  Stage II.  Long discussion the patient and family regarding the diagnosis and overall prognosis; and the fact that this is incurable disease. Discussed the role of bone marrow biopsy- but I dont think this is going to change pt's management. Bone Biopsy was not submitted to pathology; discussed with Dr.RUbinas.   #Discussed the treatments are palliative/control will disease and not curative.  Given her advanced age/borderline performance status/mild to moderate dementia-I would recommend starting dexamethasone 4 mg Monday Tuesday Wednesday [3 times a week]; and then add lenalidomide 5 mg 2 weeks on 1 week off.   #Discussed the potential side effects of steroids;  and also discussed the potential side effects of Revlimid-including but not limited to teratogenicity; diarrhea skin rash blood clots.  Patient on aspirin.  #Anemia hemoglobin 8.8; second multiple myeloma; plan treatment above.  Stable  #Constipation-NEW/worsened discontinue iron; recommend continued MiraLAX stool softeners.  #Hypercalcemia-status post Zometa in the hospital; currently calcium 8.2.  Monitor closely especially with hypoalbuminemia.  Recommend Zometa if calcium is elevated again.  Improved   # follow up June 10th/labs; discussed with oral pharmacy; plan to start at that visit.   # 40 minutes face-to-face with the patient discussing the above plan of care; more than 50% of time spent on prognosis/ natural history; counseling and coordination.   All questions were answered. The patient knows to call the clinic with any problems, questions or concerns.       Cammie Sickle, MD 06/15/2017 6:09 PM

## 2017-06-15 NOTE — Progress Notes (Signed)
patient weight in w/c. w/c from facility weighs 20 kg as noted on chair. weight with w/c and patient is 156. patient has lost 21% of weight since last weight in [place]. last recorded weight per daughter was 130. pt has poor appetite. Current weight is 112.

## 2017-06-19 ENCOUNTER — Telehealth: Payer: Self-pay | Admitting: Pharmacist

## 2017-06-19 ENCOUNTER — Telehealth: Payer: Self-pay | Admitting: *Deleted

## 2017-06-19 NOTE — Telephone Encounter (Signed)
Oral Oncology Pharmacist Encounter  Received new prescription for Revlimid (lenalidomide) for the treatment of multiple myeloma in conjunction with dexamethasone, planned duration until disease progression or unacceptable drug toxicity.  CBC/CMP from 06/15/17 assessed, no relevant lab abnormalities. Prescription dose and frequency assessed. For patient tolerance, she is starting on a low dose  Current medication list in Epic reviewed, no DDIs with Revlimid identified.  Prescription will be e-scribed to [facility].   Oral Oncology Clinic will continue to follow for insurance authorization, copayment issues, initial counseling and start date.  Darl Pikes, PharmD, BCPS Hematology/Oncology Clinical Pharmacist [facility] [phone]  06/19/2017 8:32 AM

## 2017-06-19 NOTE — Telephone Encounter (Signed)
Daughter returned my phone call - see phone note from 06/19/17

## 2017-06-19 NOTE — Telephone Encounter (Signed)
Oral Oncology Pharmacist Encounter   Prior Authorization for Revlimid has been approved.     PA# [id number] Effective dates: 06/19/17 through 01/22/18   Oral Oncology Clinic will continue to follow.   I will place a copy of the approval letter to be scanned into patient's chart.  Darl Pikes, PharmD, BCPS Hematology/Oncology Clinical Pharmacist [facility] [facility] [phone]  06/19/2017 11:10 AM

## 2017-06-19 NOTE — Telephone Encounter (Signed)
Oral Oncology Pharmacist Encounter  Received notification from [facility] Medicare that prior authorization for Revlimid is required.  PA submitted on CoverMyMeds (EnvisionRx) Key [id number] Status is pending  Oral Oncology Clinic will continue to follow.  Darl Pikes, PharmD, BCPS Hematology/Oncology Clinical Pharmacist [facility] [place] [facility] [phone]  06/19/2017 9:14 AM

## 2017-06-19 NOTE — Telephone Encounter (Signed)
Phone call returned. Discussed the REMS program with daughter. Daughter states that family is in the midst of changing legal paperwork for durable power of attorney/healthcare poa/living will. Daughter states that her brother 'Sevin Farone' who is now deceased was the only family member on the documents. They are working with a lawyer to resolve this as quickly as possible and may have the finalized documents some time this week. At that time, she will reach back to me with the copy of these legal documents, so that the REMS survey can be performed. Due to pt's dementia, she is not able to self-enroll in the REMS program.  Pt has an apt on 6/10 with covering provider in Dr. Sharmaine Base absence. She states that she is optimistic that all this will be resolved within the week.

## 2017-06-19 NOTE — Telephone Encounter (Addendum)
Daughter [name] returning [employer]. Please return her call at home or on cell

## 2017-06-24 DIAGNOSIS — I129 Hypertensive chronic kidney disease with stage 1 through stage 4 chronic kidney disease, or unspecified chronic kidney disease: Secondary | ICD-10-CM | POA: Diagnosis not present

## 2017-06-24 DIAGNOSIS — S72046D Nondisplaced fracture of base of neck of unspecified femur, subsequent encounter for closed fracture with routine healing: Secondary | ICD-10-CM | POA: Diagnosis not present

## 2017-06-24 DIAGNOSIS — D631 Anemia in chronic kidney disease: Secondary | ICD-10-CM | POA: Diagnosis not present

## 2017-06-24 DIAGNOSIS — N183 Chronic kidney disease, stage 3 (moderate): Secondary | ICD-10-CM | POA: Diagnosis not present

## 2017-06-24 DIAGNOSIS — F329 Major depressive disorder, single episode, unspecified: Secondary | ICD-10-CM | POA: Diagnosis not present

## 2017-06-24 DIAGNOSIS — S72142D Displaced intertrochanteric fracture of left femur, subsequent encounter for closed fracture with routine healing: Secondary | ICD-10-CM | POA: Diagnosis not present

## 2017-06-24 DIAGNOSIS — Z9181 History of falling: Secondary | ICD-10-CM | POA: Diagnosis not present

## 2017-06-24 DIAGNOSIS — C9000 Multiple myeloma not having achieved remission: Secondary | ICD-10-CM | POA: Diagnosis not present

## 2017-06-24 DIAGNOSIS — M1991 Primary osteoarthritis, unspecified site: Secondary | ICD-10-CM | POA: Diagnosis not present

## 2017-06-28 ENCOUNTER — Other Ambulatory Visit: Payer: Self-pay | Admitting: *Deleted

## 2017-06-28 ENCOUNTER — Encounter: Payer: Self-pay | Admitting: *Deleted

## 2017-06-28 NOTE — Patient Outreach (Addendum)
Transition of care call completed today. Pt came home from [facility] on 06/23/2017. Pt has 24 hour supervision by family. She is getting home health nursing and PT. She in in the process of getting her HCPOA, she has a living will already. Daughter, Ms. York would like a MOST form.  I have given her information about our Care Management services and they will participate in transition of care calls from me over the next month.  I will send her a MOST form.  I have encouraged Ms. York to call me with any issues, problems.  Patient was recently discharged from hospital and all medications have been reviewed.  Allergies as of 06/28/2017      Reactions   Codeine Nausea And Vomiting   Penicillins Swelling, Other (See Comments)   Lips swell Has patient had a PCN reaction causing immediate rash, facial/tongue/throat swelling, SOB or lightheadedness with hypotension: Yes Has patient had a PCN reaction causing severe rash involving mucus membranes or skin necrosis: No Has patient had a PCN reaction that required hospitalization: No Has patient had a PCN reaction occurring within the last 10 years: No If all of the above answers are "NO", then may proceed with Cephalosporin use.      Medication List        Accurate as of 06/28/17 11:08 AM. Always use your most recent med list.          acetaminophen 325 MG tablet Commonly known as:  TYLENOL Take 2 tablets (650 mg total) by mouth every 6 (six) hours as needed for mild pain, moderate pain or fever.   amLODipine 5 MG tablet Commonly known as:  NORVASC Take 1 tablet (5 mg total) by mouth daily.   ATORVASTATIN CALCIUM PO Take 5 mg by mouth daily.   calcium gluconate 500 MG tablet Take 1 tablet by mouth 2 (two) times daily.   feeding supplement (ENSURE ENLIVE) Liqd Take 237 mLs by mouth 2 (two) times daily between meals.   lenalidomide 5 MG capsule Commonly known as:  REVLIMID 2 weeks-ON and 1 week OFF   levothyroxine 25 MCG  tablet Commonly known as:  SYNTHROID, LEVOTHROID Take 1 tablet (25 mcg total) by mouth daily before breakfast.   metoprolol tartrate 25 MG tablet Commonly known as:  LOPRESSOR Take 1 tablet (25 mg total) by mouth 2 (two) times daily.   PARoxetine 40 MG tablet Commonly known as:  PAXIL Take 40 mg by mouth at bedtime.   polyethylene glycol packet Commonly known as:  MIRALAX / GLYCOLAX Take 17 g by mouth daily.   senna-docusate 8.6-50 MG tablet Commonly known as:  Senokot-S Take 2 tablets by mouth at bedtime as needed for mild constipation.   traMADol 50 MG tablet Commonly known as:  ULTRAM Take 1 tablet (50 mg total) by mouth every 6 (six) hours as needed for moderate pain or severe pain.   traZODone 50 MG tablet Commonly known as:  DESYREL Take 25 mg by mouth at bedtime.   vitamin B-12 1000 MCG tablet Commonly known as:  CYANOCOBALAMIN Take 1,000 mcg by mouth daily.   Vitamin D 2000 units tablet Take 1 tablet by mouth daily.      Depression screen PHQ 2/9 06/28/2017  Decreased Interest 3  Down, Depressed, Hopeless 1  PHQ - 2 Score 4  Altered sleeping 3  Tired, decreased energy 3  Change in appetite 1  Feeling bad or failure about yourself  0  Trouble concentrating 2  Moving  slowly or fidgety/restless 0  Suicidal thoughts 0  PHQ-9 Score 13  Difficult doing work/chores Somewhat difficult   The patient has a history of falls. I did complete a risk assessment for falls. A plan of care for falls was documented.   THN CM Care Plan Problem One     Most Recent Value  Care Plan Problem One  Frequent falls  Role Documenting the Problem One  Care Management [place] for Problem One  Active  THN CM Short Term Goal #1   Pt will participate fully with PT as reported by family members over the next 30days.  THN CM Short Term Goal #1 Start Date  06/28/17  Interventions for Short Term Goal #1  Encouraged daughter to encouraged pt to give 100% in her therapy  sessions.   THN CM Short Term Goal #2   Pt will not fall over the next 30 days per report by family.  THN CM Short Term Goal #2 Start Date  06/28/17  Interventions for Short Term Goal #2  Verbally assessed safety measures which are adequate and pt has 24 hour supervision.    THN CM Care Plan Problem Two     Most Recent Value  Care Plan Problem Two  HCPOA, living will and MOST forms are not in medical record.  Role Documenting the Problem Two  Care Management Coordinator  Care Plan for Problem Two  Active  THN CM Short Term Goal #1   Family to get a copy of HCPOA and Living will to MD to put in electronic record.  THN CM Short Term Goal #1 Start Date  06/28/17  Interventions for Short Term Goal #2   Request that daughter get copy to MD when prepared by the end of the month.  THN CM Short Term Goal #2   Family to assist pt to complete a MOST form in the next 30 days.  THN CM Short Term Goal #2 Start Date  06/28/17  Interventions for Short Term Goal #2  I am sending a MOST form for completetion.     I will be calling weekly for the next month.  [name]. [name], MSN, [facility] Gerontological Nurse Practitioner [facility] Care Management [phone]

## 2017-07-02 ENCOUNTER — Encounter: Payer: Self-pay | Admitting: Oncology

## 2017-07-02 ENCOUNTER — Telehealth: Payer: Self-pay | Admitting: Pharmacist

## 2017-07-02 ENCOUNTER — Inpatient Hospital Stay: Payer: PPO | Attending: Oncology

## 2017-07-02 ENCOUNTER — Other Ambulatory Visit: Payer: Self-pay | Admitting: *Deleted

## 2017-07-02 ENCOUNTER — Other Ambulatory Visit: Payer: Self-pay

## 2017-07-02 ENCOUNTER — Inpatient Hospital Stay: Payer: PPO | Admitting: Oncology

## 2017-07-02 VITALS — BP 123/74 | HR 102 | Temp 98.4°F | Resp 18 | Wt 111.7 lb

## 2017-07-02 DIAGNOSIS — I129 Hypertensive chronic kidney disease with stage 1 through stage 4 chronic kidney disease, or unspecified chronic kidney disease: Secondary | ICD-10-CM | POA: Diagnosis not present

## 2017-07-02 DIAGNOSIS — D7289 Other specified disorders of white blood cells: Secondary | ICD-10-CM | POA: Diagnosis not present

## 2017-07-02 DIAGNOSIS — Z7952 Long term (current) use of systemic steroids: Secondary | ICD-10-CM | POA: Diagnosis not present

## 2017-07-02 DIAGNOSIS — C9002 Multiple myeloma in relapse: Secondary | ICD-10-CM | POA: Diagnosis not present

## 2017-07-02 DIAGNOSIS — C9000 Multiple myeloma not having achieved remission: Secondary | ICD-10-CM | POA: Diagnosis not present

## 2017-07-02 DIAGNOSIS — M1991 Primary osteoarthritis, unspecified site: Secondary | ICD-10-CM | POA: Diagnosis not present

## 2017-07-02 DIAGNOSIS — Z9181 History of falling: Secondary | ICD-10-CM | POA: Diagnosis not present

## 2017-07-02 DIAGNOSIS — D729 Disorder of white blood cells, unspecified: Secondary | ICD-10-CM

## 2017-07-02 DIAGNOSIS — N183 Chronic kidney disease, stage 3 unspecified: Secondary | ICD-10-CM

## 2017-07-02 DIAGNOSIS — D72829 Elevated white blood cell count, unspecified: Secondary | ICD-10-CM

## 2017-07-02 DIAGNOSIS — D630 Anemia in neoplastic disease: Secondary | ICD-10-CM | POA: Insufficient documentation

## 2017-07-02 DIAGNOSIS — S72046D Nondisplaced fracture of base of neck of unspecified femur, subsequent encounter for closed fracture with routine healing: Secondary | ICD-10-CM | POA: Diagnosis not present

## 2017-07-02 DIAGNOSIS — S72142D Displaced intertrochanteric fracture of left femur, subsequent encounter for closed fracture with routine healing: Secondary | ICD-10-CM | POA: Diagnosis not present

## 2017-07-02 DIAGNOSIS — D631 Anemia in chronic kidney disease: Secondary | ICD-10-CM | POA: Diagnosis not present

## 2017-07-02 DIAGNOSIS — F329 Major depressive disorder, single episode, unspecified: Secondary | ICD-10-CM | POA: Diagnosis not present

## 2017-07-02 LAB — CBC WITH DIFFERENTIAL/PLATELET
BASOS ABS: 0.1 10*3/uL (ref 0–0.1)
BASOS PCT: 1 %
EOS ABS: 0 10*3/uL (ref 0–0.7)
Eosinophils Relative: 0 %
HCT: 28 % — ABNORMAL LOW (ref 35.0–47.0)
HEMOGLOBIN: 9.4 g/dL — AB (ref 12.0–16.0)
Lymphocytes Relative: 8 %
Lymphs Abs: 0.9 10*3/uL — ABNORMAL LOW (ref 1.0–3.6)
MCH: 32.8 pg (ref 26.0–34.0)
MCHC: 33.7 g/dL (ref 32.0–36.0)
MCV: 97.6 fL (ref 80.0–100.0)
MONOS PCT: 2 %
Monocytes Absolute: 0.2 10*3/uL (ref 0.2–0.9)
NEUTROS PCT: 89 %
Neutro Abs: 9.4 10*3/uL — ABNORMAL HIGH (ref 1.4–6.5)
Platelets: 390 10*3/uL (ref 150–440)
RBC: 2.87 MIL/uL — ABNORMAL LOW (ref 3.80–5.20)
RDW: 19.7 % — AB (ref 11.5–14.5)
WBC: 10.7 10*3/uL (ref 3.6–11.0)

## 2017-07-02 LAB — COMPREHENSIVE METABOLIC PANEL
ALBUMIN: 2.8 g/dL — AB (ref 3.5–5.0)
ALK PHOS: 119 U/L (ref 38–126)
ALT: 12 U/L — ABNORMAL LOW (ref 14–54)
ANION GAP: 12 (ref 5–15)
AST: 19 U/L (ref 15–41)
BUN: 14 mg/dL (ref 6–20)
CO2: 23 mmol/L (ref 22–32)
Calcium: 8.5 mg/dL — ABNORMAL LOW (ref 8.9–10.3)
Chloride: 103 mmol/L (ref 101–111)
Creatinine, Ser: 1.02 mg/dL — ABNORMAL HIGH (ref 0.44–1.00)
GFR calc Af Amer: 56 mL/min — ABNORMAL LOW (ref >60)
GFR calc non Af Amer: 48 mL/min — ABNORMAL LOW (ref >60)
Glucose, Bld: 116 mg/dL — ABNORMAL HIGH (ref 65–99)
POTASSIUM: 4.2 mmol/L (ref 3.5–5.1)
SODIUM: 138 mmol/L (ref 135–145)
Total Bilirubin: 1.2 mg/dL (ref 0.3–1.2)
Total Protein: 9 g/dL — ABNORMAL HIGH (ref 6.5–8.1)

## 2017-07-02 LAB — SAMPLE TO BLOOD BANK

## 2017-07-02 MED ORDER — ASPIRIN EC 81 MG PO TBEC: 81.0000 mg | DELAYED_RELEASE_TABLET | Freq: Every day | ORAL | 1 refills | Status: DC

## 2017-07-02 MED ORDER — LENALIDOMIDE 5 MG PO CAPS: 5.0000 mg | ORAL_CAPSULE | Freq: Every day | ORAL | 0 refills | Status: DC

## 2017-07-02 NOTE — Progress Notes (Signed)
Rems consent performed with HPOA- Max Sane- ?[id number] rems auth/survey completed.

## 2017-07-02 NOTE — Progress Notes (Signed)
Patient here for follow up

## 2017-07-02 NOTE — Telephone Encounter (Signed)
Oral Chemotherapy Pharmacist Encounter   Prescription for Revlimid has been sent to [facility] along with her REMS number ?[id number] . Supportive documentation was faxed to [employer]. PA has already been obtained.   Darl Pikes, PharmD, BCPS Hematology/Oncology Clinical Pharmacist [facility] [place] [facility] [phone]  07/02/2017 4:08 PM

## 2017-07-02 NOTE — Telephone Encounter (Signed)
Oral Chemotherapy Pharmacist Encounter  Patient Education I spoke with patient, her daughter, and her daughter-in-law for overview of new oral chemotherapy medication: Revlimid (lenalidomide) for the treatment of multiple myeloma, planned duration until disease progression or unacceptable drug toxicity.   Counseled patient on administration, dosing, side effects, monitoring, drug-food interactions, safe handling, storage, and disposal. Patient will take 5mg daily for 14 days on, then 7 days off.  She will also take aspirin 81mg daily.  Side effects include but not limited to: N/V, diarrhea, constipation, decrease wbc/hgb/plt, fatigue, rash, blood clots.    Reviewed with patient importance of keeping a medication schedule and plan for any missed doses.  Theresa Lynch voiced understanding and appreciation. All questions answered. Medication handout provided and consent obtained via POA.   Provided patient with Oral Chemotherapy Navigation Clinic phone number. Patient knows to call the office with questions or concerns. Oral Chemotherapy Navigation Clinic will continue to follow.   N. , PharmD, BCPS Hematology/Oncology Clinical Pharmacist [facility] [phone]  07/02/2017 3:45 PM  

## 2017-07-02 NOTE — Progress Notes (Signed)
Theresa Lynch NOTE  Patient Care Team: [name], MD as PCP - General (Family Medicine) Deloria Lair, NP as [place] Management  CHIEF COMPLAINTS/PURPOSE OF CONSULTATION: MULTIPLE MYELOMA #  Oncology History   # MAY 2019- MULTIPLE MYELOMA [No BMBx]; IgA-K [baseline- 3.4 gm/dl; ]  # Hypercalcemia- Theresa Lynch 68m IVP [May 2019]  # Mod dementia; Theresa Lynch [may 2019]  -------------------------------------------------------------------------   DIAGNOSIS: [MAY 2019]- ACTIVE MULTIPLE MYELOMA  STAGE:  II  ; GOALS: PALLIATIVE  CURRENT/MOST RECENT THERAPY '[ ]'       [place] in relapse ([place]     HISTORY OF PRESENTING ILLNESS:  Theresa Napoli84y.o.  female with mild to moderate dementia with newly diagnosed multiple myeloma presents  For management of multiple myeloma. I am covering Dr.Brahmanday to evaluate this patient.   May 2019 She had left hip fracture status post ORIF;  During the hospitalizations patient was noted to have anemia/renal insufficiency creatinine up to 1.5/hypercalcemia/multiple lytic bone lesions in the skull. Steroid started at nursing home with Dexamethasone 42mMonday Tuesday wednesday [3 times a week]  # Today patient presents to discuss about future management. Accompanied with daughter and daughter in law.  [facility] now lives with son. She is able to walk with a walker. Has home PT.  Patient does not have new complaints. Not a good historian due to mild dementia.  # Hip fracture Theresa ORIF. Stable. No on any coagulants since discharge. Pain is controlled with tramadol PRN # Fatigue,chronic, stable. Appetite is better during the days when she takes Dexamethasone.  .   Review of Systems  Constitutional: Positive for malaise/fatigue and weight loss. Negative for chills, diaphoresis and fever.  HENT: Negative for congestion, ear discharge, ear pain,  nosebleeds, sinus pain and sore throat.   Eyes: Negative for double vision, photophobia, pain, discharge and redness.  Respiratory: Negative for cough, hemoptysis, sputum production, shortness of breath and wheezing.   Cardiovascular: Negative for chest pain, palpitations, orthopnea, claudication and leg swelling.  Gastrointestinal: Negative for abdominal pain, blood in stool, constipation, diarrhea, heartburn, melena, nausea and vomiting.  Genitourinary: Negative for dysuria, flank pain, frequency, hematuria and urgency.  Musculoskeletal: Negative for back pain, joint pain, myalgias and neck pain.  Skin: Negative for itching and rash.  Neurological: Negative for dizziness, tingling, tremors, focal weakness, weakness and headaches.  Endo/Heme/Allergies: Negative for environmental allergies. Does not bruise/bleed easily.  Psychiatric/Behavioral: Negative for depression and hallucinations. The patient is not nervous/anxious and does not have insomnia.      MEDICAL HISTORY:  Past Medical History:  Diagnosis Date  . Arthritis    hands  . Cataract   . Claustrophobia   . Depression    with anxiety/h/o agoraphobia  . Glaucoma   . Hip fracture (HCG. L. Garcia   left hip  . HOH (hard of hearing)   . Hyperlipidemia   . Hypertension   . Multiple myeloma ([place]  . PONV (postoperative nausea and vomiting)     SURGICAL HISTORY: Past Surgical History:  Procedure Laterality Date  . CATARACT EXTRACTION W/PHACO Right 03/15/2015   Procedure: CATARACT EXTRACTION PHACO AND INTRAOCULAR LENS PLACEMENT (IOC);  Surgeon: AnRonnell FreshwaterMD;  [facility] Service: Ophthalmology;  Laterality: Right;  . INTRAMEDULLARY (IM) NAIL INTERTROCHANTERIC Left 05/25/2017   Procedure: INTRAMEDULLARY (IM) NAIL INTERTROCHANTRIC;  Surgeon: KrThornton ParkMD;  Location: [facility];  Service: Orthopedics;  Laterality: Left;  .  ROTATOR CUFF REPAIR  1999   bilateral    SOCIAL HISTORY: Social History    Socioeconomic History  . Marital status: Married    Spouse name: Not on file  . Number of children: Not on file  . Years of education: Not on file  . Highest education level: Not on file  Occupational History  . Not on file  Social Needs  . Financial resource strain: Not on file  . Food insecurity:    Worry: Not on file    Inability: Not on file  . Transportation needs:    Medical: Not on file    Non-medical: Not on file  Tobacco Use  . Smoking status: Never Smoker  . Smokeless tobacco: Never Used  Substance and Sexual Activity  . Alcohol use: Yes    Alcohol/week: 4.2 oz    Types: 7 Glasses of wine per week  . Drug use: No  . Sexual activity: Not Currently  Lifestyle  . Physical activity:    Days per week: Not on file    Minutes per session: Not on file  . Stress: Not on file  Relationships  . Social connections:    Talks on phone: Not on file    Gets together: Not on file    Attends religious service: Not on file    Active member of club or organization: Not on file    Attends meetings of clubs or organizations: Not on file    Relationship status: Not on file  . Intimate partner violence:    Fear of current or ex partner: Not on file    Emotionally abused: Not on file    Physically abused: Not on file    Forced sexual activity: Not on file  Other Topics Concern  . Not on file  Social History Narrative  . Not on file    FAMILY HISTORY: Family History  Problem Relation Age of Onset  . Cancer - Other Son        appendix cancer  . Prostate cancer Brother   . CAD Brother   . Kidney cancer Father   . Breast cancer Sister   . Stroke Sister     ALLERGIES:  is allergic to codeine and penicillins.  MEDICATIONS:  Current Outpatient Medications  Medication Sig Dispense Refill  . acetaminophen (TYLENOL) 325 MG tablet Take 2 tablets (650 mg total) by mouth every 6 (six) hours as needed for mild pain, moderate pain or fever. 30 tablet 2  . amLODipine (NORVASC)  5 MG tablet Take 1 tablet (5 mg total) by mouth daily. 30 tablet 0  . ATORVASTATIN CALCIUM PO Take 5 mg by mouth daily.     . calcium gluconate 500 MG tablet Take 1 tablet by mouth 2 (two) times daily.    . Cholecalciferol (VITAMIN D) 2000 units tablet Take 1 tablet by mouth daily.    Marland Kitchen dexamethasone (DECADRON) 4 MG tablet Taking Monday, Tuesday, Wednesday.    . levothyroxine (SYNTHROID, LEVOTHROID) 25 MCG tablet Take 1 tablet (25 mcg total) by mouth daily before breakfast. 30 tablet 1  . metoprolol tartrate (LOPRESSOR) 25 MG tablet Take 1 tablet (25 mg total) by mouth 2 (two) times daily. 60 tablet 1  . PARoxetine (PAXIL) 40 MG tablet Take 40 mg by mouth at bedtime.    . polyethylene glycol (MIRALAX / GLYCOLAX) packet Take 17 g by mouth daily. 14 each 0  . traMADol (ULTRAM) 50 MG tablet Take 1 tablet (50 mg total) by  mouth every 6 (six) hours as needed for moderate pain or severe pain. 30 tablet 0  . traZODone (DESYREL) 50 MG tablet Take 25 mg by mouth at bedtime.     . vitamin B-12 (CYANOCOBALAMIN) 1000 MCG tablet Take 1,000 mcg by mouth daily.    Marland Kitchen aspirin EC 81 MG tablet Take 1 tablet (81 mg total) by mouth daily. 30 tablet 1  . feeding supplement, ENSURE ENLIVE, (ENSURE ENLIVE) LIQD Take 237 mLs by mouth 2 (two) times daily between meals. (Patient not taking: Reported on 06/15/2017) 237 mL 12  . lenalidomide (REVLIMID) 5 MG capsule Take 1 capsule (5 mg total) by mouth daily. Take for 14 days on, then 7 days off. 14 capsule 0  . senna-docusate (SENOKOT-Theresa) 8.6-50 MG tablet Take 2 tablets by mouth at bedtime as needed for mild constipation.      No current facility-administered medications for this visit.       Marland Kitchen  PHYSICAL EXAMINATION: ECOG PERFORMANCE STATUS: 2 - Symptomatic, <50% confined to bed  Vitals:   07/02/17 1416  BP: 123/74  Pulse: (!) 102  Resp: 18  Temp: 98.4 F (36.9 C)   Filed Weights   07/02/17 1416  Weight: 111 lb 11.2 oz (50.7 kg)   .Physical Exam   Constitutional: She is oriented to person, place, and time and well-developed, well-nourished, and in no distress. No distress.  Frail-appearing Caucasian female patient alert, no distress and comfortable  HENT:  Head: Normocephalic and atraumatic.  Nose: Nose normal.  Mouth/Throat: Oropharynx is clear and moist. No oropharyngeal exudate.  Eyes: Pupils are equal, round, and reactive to light. EOM are normal. No scleral icterus.  Neck: Normal range of motion. Neck supple.  Cardiovascular: Normal rate, regular rhythm and normal heart sounds.  No murmur heard. Pulmonary/Chest: Effort normal and breath sounds normal. No respiratory distress. She has no wheezes. She has no rales. She exhibits no tenderness.  Abdominal: Soft. Bowel sounds are normal. She exhibits no distension. There is no tenderness.  Musculoskeletal: Normal range of motion. She exhibits no edema or tenderness.  Lymphadenopathy:    She has no cervical adenopathy.  Neurological: She is alert and oriented to person, place, and time. No cranial nerve deficit.  Skin: Skin is warm and dry. She is not diaphoretic. No erythema.  Psychiatric: Affect normal.   LABORATORY DATA:  I have reviewed the data as listed Lab Results  Component Value Date   WBC 10.7 07/02/2017   HGB 9.4 (L) 07/02/2017   HCT 28.0 (L) 07/02/2017   MCV 97.6 07/02/2017   PLT 390 07/02/2017   Recent Labs    05/26/17 0419  05/31/17 0505 06/15/17 1329 07/02/17 1400  NA 138   < > 134* 135 138  K 4.8   < > 4.1 4.2 4.2  CL 104   < > 103 100* 103  CO2 23   < > '23 23 23  ' GLUCOSE 115*   < > 93 189* 116*  BUN 31*   < > '19 14 14  ' CREATININE 1.24*   < > 1.03* 1.05* 1.02*  CALCIUM 10.4*  10.1   < > 7.2* 8.2* 8.5*  GFRNONAA 38*   < > 48* 47* 48*  GFRAA 45*   < > 56* 54* 56*  PROT 8.5*  --   --  8.8* 9.0*  ALBUMIN 2.4*  --   --  2.4* 2.8*  AST 53*  --   --  21 19  ALT 26  --   --  11* 12*  ALKPHOS 75  --   --  134* 119  BILITOT 2.1*  --   --  0.8 1.2   <  > = values in this interval not displayed.   SPEP IgA 5027 M spike 3.4 Kappa light chain 319, kappa/lamda ration 199.44.    RADIOGRAPHIC STUDIES: I have personally reviewed the radiological images as listed and agreed with the findings in the report. # Skeletal Bone survey.  Multiple rounded lytic lesions in the skull, superimpsed on background of diffusely mottled lucency. No spinal lytic lesions.  Heterogenous lucency within the proximal humeri, radii, and femora.   ASSESSMENT & PLAN:  84 yo female with mild/moderate dementia presents for mangement of newly diagnosed IgA multiple myeloma.  1. Multiple myeloma not having achieved remission ([place])   2. Neutrophilia   3. Stage 3 chronic kidney disease ([place])     # Multiple myeloma, IgA Kappa. Tolerates Dexamethasone 19m Monday Tuesday Wednesday [3 times a week]. Clinically improved.  Discussed with patient and her family members about starting low dose Revlimid 5102mDay 1-14 for every 21 days. Side effects including but not limited to low blood counts, blood clots, skin rash, infection, bleeding, diarrhea. Patient and family members voice understanding and willing to proceed. Pharmacist Alyson met with patient and family.  Add Aspirin 8159maily for DVT prophylaxis.  Follow up in 1 week, with cbc and cmp.   # Neutrophilia: likely due to steroid.  # CKD: creatinine stable.  All questions were answered. The patient knows to call the clinic with any problems, questions or concerns. Total face to face encounter time for this patient visit was 40 min. >50% of the time was  spent in counseling and coordination of care.    ZhoEarlie ServerD, PhD Hematology Oncology [facility] [phone]

## 2017-07-03 LAB — MULTIPLE MYELOMA PANEL, SERUM
ALPHA 1: 0.2 g/dL (ref 0.0–0.4)
Albumin SerPl Elph-Mcnc: 3.1 g/dL (ref 2.9–4.4)
Albumin/Glob SerPl: 0.6 — ABNORMAL LOW (ref 0.7–1.7)
Alpha2 Glob SerPl Elph-Mcnc: 0.6 g/dL (ref 0.4–1.0)
B-GLOBULIN SERPL ELPH-MCNC: 4.7 g/dL — AB (ref 0.7–1.3)
GAMMA GLOB SERPL ELPH-MCNC: 0.2 g/dL — AB (ref 0.4–1.8)
Globulin, Total: 5.8 g/dL — ABNORMAL HIGH (ref 2.2–3.9)
IGA: 5114 mg/dL — AB (ref 64–422)
IgG (Immunoglobin G), Serum: 162 mg/dL — ABNORMAL LOW (ref 700–1600)
IgM (Immunoglobulin M), Srm: <5 mg/dL — ABNORMAL LOW (ref 26–217)
M Protein SerPl Elph-Mcnc: 3.4 g/dL — ABNORMAL HIGH
Total Protein ELP: 8.9 g/dL — ABNORMAL HIGH (ref 6.0–8.5)

## 2017-07-03 LAB — KAPPA/LAMBDA LIGHT CHAINS
KAPPA, LAMDA LIGHT CHAIN RATIO: 402.59 — AB (ref 0.26–1.65)
Kappa free light chain: 684.4 mg/L — ABNORMAL HIGH (ref 3.3–19.4)
Lambda free light chains: 1.7 mg/L — ABNORMAL LOW (ref 5.7–26.3)

## 2017-07-04 DIAGNOSIS — E876 Hypokalemia: Secondary | ICD-10-CM | POA: Diagnosis not present

## 2017-07-04 DIAGNOSIS — I619 Nontraumatic intracerebral hemorrhage, unspecified: Secondary | ICD-10-CM | POA: Diagnosis not present

## 2017-07-04 DIAGNOSIS — D72829 Elevated white blood cell count, unspecified: Secondary | ICD-10-CM | POA: Diagnosis not present

## 2017-07-04 DIAGNOSIS — S06890A Other specified intracranial injury without loss of consciousness, initial encounter: Secondary | ICD-10-CM | POA: Diagnosis not present

## 2017-07-04 DIAGNOSIS — Z9181 History of falling: Secondary | ICD-10-CM | POA: Diagnosis not present

## 2017-07-05 NOTE — Telephone Encounter (Signed)
Oral Chemotherapy Pharmacist Encounter   Called [employer] and they stated they were having trouble with the REMS number provided. Called [employer] and they stated that the previous number was cancelled due to a mismatch between the POA that signed and the patient. I was able to obtain a new auth number over the phone [id number]  Called [employer] and provided them with the new auth number. They stated that the patient was enrolled with [employer] for copay assistance and they should be able to process the prescription today now that the new REMs number has been received.   Darl Pikes, PharmD, BCPS Hematology/Oncology Clinical Pharmacist [facility] [place] [facility] [phone]  07/05/2017 8:56 AM

## 2017-07-06 ENCOUNTER — Other Ambulatory Visit: Payer: Self-pay | Admitting: *Deleted

## 2017-07-06 DIAGNOSIS — C9000 Multiple myeloma not having achieved remission: Secondary | ICD-10-CM | POA: Diagnosis not present

## 2017-07-06 DIAGNOSIS — M1991 Primary osteoarthritis, unspecified site: Secondary | ICD-10-CM | POA: Diagnosis not present

## 2017-07-06 DIAGNOSIS — D631 Anemia in chronic kidney disease: Secondary | ICD-10-CM | POA: Diagnosis not present

## 2017-07-06 DIAGNOSIS — Z9181 History of falling: Secondary | ICD-10-CM | POA: Diagnosis not present

## 2017-07-06 DIAGNOSIS — I129 Hypertensive chronic kidney disease with stage 1 through stage 4 chronic kidney disease, or unspecified chronic kidney disease: Secondary | ICD-10-CM | POA: Diagnosis not present

## 2017-07-06 DIAGNOSIS — S72142D Displaced intertrochanteric fracture of left femur, subsequent encounter for closed fracture with routine healing: Secondary | ICD-10-CM | POA: Diagnosis not present

## 2017-07-06 DIAGNOSIS — F329 Major depressive disorder, single episode, unspecified: Secondary | ICD-10-CM | POA: Diagnosis not present

## 2017-07-06 DIAGNOSIS — N183 Chronic kidney disease, stage 3 (moderate): Secondary | ICD-10-CM | POA: Diagnosis not present

## 2017-07-06 DIAGNOSIS — S72046D Nondisplaced fracture of base of neck of unspecified femur, subsequent encounter for closed fracture with routine healing: Secondary | ICD-10-CM | POA: Diagnosis not present

## 2017-07-06 NOTE — Patient Outreach (Signed)
Transition of care call, week 2, post SNF stay. Unable to reach pt or daughter today, left message requesting return call.  [name]. [name], MSN, [facility] Gerontological Nurse Practitioner [facility] Care Management [phone]

## 2017-07-12 ENCOUNTER — Inpatient Hospital Stay: Payer: PPO

## 2017-07-12 ENCOUNTER — Inpatient Hospital Stay: Payer: PPO | Admitting: Oncology

## 2017-07-12 ENCOUNTER — Other Ambulatory Visit: Payer: Self-pay

## 2017-07-12 ENCOUNTER — Encounter: Payer: Self-pay | Admitting: Oncology

## 2017-07-12 VITALS — BP 120/78 | HR 83 | Temp 97.9°F | Resp 18 | Wt 114.2 lb

## 2017-07-12 DIAGNOSIS — C9000 Multiple myeloma not having achieved remission: Secondary | ICD-10-CM

## 2017-07-12 DIAGNOSIS — D630 Anemia in neoplastic disease: Secondary | ICD-10-CM

## 2017-07-12 DIAGNOSIS — I129 Hypertensive chronic kidney disease with stage 1 through stage 4 chronic kidney disease, or unspecified chronic kidney disease: Secondary | ICD-10-CM

## 2017-07-12 DIAGNOSIS — Z7952 Long term (current) use of systemic steroids: Secondary | ICD-10-CM

## 2017-07-12 DIAGNOSIS — C9002 Multiple myeloma in relapse: Secondary | ICD-10-CM | POA: Diagnosis not present

## 2017-07-12 DIAGNOSIS — N183 Chronic kidney disease, stage 3 unspecified: Secondary | ICD-10-CM

## 2017-07-12 LAB — CBC WITH DIFFERENTIAL/PLATELET
BASOS ABS: 0 10*3/uL (ref 0–0.1)
BASOS PCT: 0 %
EOS PCT: 0 %
Eosinophils Absolute: 0 10*3/uL (ref 0–0.7)
HEMATOCRIT: 28.4 % — AB (ref 35.0–47.0)
Hemoglobin: 9.5 g/dL — ABNORMAL LOW (ref 12.0–16.0)
Lymphocytes Relative: 15 %
Lymphs Abs: 1.2 10*3/uL (ref 1.0–3.6)
MCH: 32.9 pg (ref 26.0–34.0)
MCHC: 33.4 g/dL (ref 32.0–36.0)
MCV: 98.4 fL (ref 80.0–100.0)
MONO ABS: 0.6 10*3/uL (ref 0.2–0.9)
Monocytes Relative: 7 %
NEUTROS ABS: 6.4 10*3/uL (ref 1.4–6.5)
Neutrophils Relative %: 78 %
PLATELETS: 296 10*3/uL (ref 150–440)
RBC: 2.88 MIL/uL — AB (ref 3.80–5.20)
RDW: 19.3 % — AB (ref 11.5–14.5)
WBC: 8.3 10*3/uL (ref 3.6–11.0)

## 2017-07-12 LAB — COMPREHENSIVE METABOLIC PANEL
ALK PHOS: 83 U/L (ref 38–126)
ALT: 25 U/L (ref 14–54)
ANION GAP: 10 (ref 5–15)
AST: 23 U/L (ref 15–41)
Albumin: 2.8 g/dL — ABNORMAL LOW (ref 3.5–5.0)
BILIRUBIN TOTAL: 1 mg/dL (ref 0.3–1.2)
BUN: 28 mg/dL — ABNORMAL HIGH (ref 6–20)
CALCIUM: 8.3 mg/dL — AB (ref 8.9–10.3)
CO2: 23 mmol/L (ref 22–32)
Chloride: 104 mmol/L (ref 101–111)
Creatinine, Ser: 1 mg/dL (ref 0.44–1.00)
GFR calc Af Amer: 57 mL/min — ABNORMAL LOW (ref >60)
GFR, EST NON AFRICAN AMERICAN: 50 mL/min — AB (ref >60)
GLUCOSE: 136 mg/dL — AB (ref 65–99)
Potassium: 3.7 mmol/L (ref 3.5–5.1)
Sodium: 137 mmol/L (ref 135–145)
TOTAL PROTEIN: 7.7 g/dL (ref 6.5–8.1)

## 2017-07-12 NOTE — Progress Notes (Signed)
Patient here today for follow up. No concerns voiced.  °

## 2017-07-13 ENCOUNTER — Other Ambulatory Visit: Payer: Self-pay | Admitting: *Deleted

## 2017-07-13 NOTE — Patient Outreach (Signed)
Transition of care call #2, was unable to reach pt/daughter last week. Today I spoke with pt's daughter, Theresa Lynch. She reports her mother is doing well. She is feeling pretty good. Appetite has picked up as a side effect of the dexamethasome. She has minimal hip pain which is managed with tramadol. She is sleeping OK. She seems to be tolerating her low dose cancer treatment and will be seen again next week to re-evaluate.  I have encouraged her daughter to celebrate her life with her now, do the things she loves, make sure all the family get some good quality visits with her.  I encouraged her to call me with any problems and reminded her I would be checking in weekly. She appreciated the call and support.  [name]. [name], MSN, [facility] Gerontological Nurse Practitioner [facility] Care Management [phone]

## 2017-07-13 NOTE — Progress Notes (Signed)
Theresa Lynch NOTE  Patient Care Team: [name], MD as PCP - General (Family Medicine) Deloria Lair, NP as [place] [facility]  CHIEF COMPLAINTS/PURPOSE OF CONSULTATION: MULTIPLE MYELOMA #  Oncology History   # MAY 2019- MULTIPLE MYELOMA [No BMBx]; IgA-K [baseline- 3.4 gm/dl; ]  # Hypercalcemia- s/p Zometa 49m IVP [May 2019]  # Mod dementia; s/p Left hip Fx [may 2019]  -------------------------------------------------------------------------   DIAGNOSIS: [MAY 2019]- ACTIVE MULTIPLE MYELOMA  STAGE:  II  ; GOALS: PALLIATIVE  CURRENT/MOST RECENT THERAPY '[ ]'       [place]     HISTORY OF PRESENTING ILLNESS:  Theresa Abdelrahman89y.o.  female with mild to moderate dementia with multiple myeloma.  I am covering Dr.Brahmanday to evaluate this patient.   I am covering Dr.Brahmanday to evaluate this patient. Multiple myeloma treatment: patient takes dexamethasone 4 mg Monday Tuesday Wednesday[3 times a week] started on Revlimid, 5 mg daily, 14 days on, 7 days off. Reports tolerating Revlimid well.  Has not noticed any side effects.  She is taking aspirin 81 mg daily. # Fatigue chronic, appetite is fair.  # Hip Fracture s/p ORIP. Stable. Pain is controlled.   Review of Systems  Constitutional: Positive for malaise/fatigue and weight loss. Negative for chills, diaphoresis and fever.  HENT: Negative for congestion, ear discharge, ear pain, nosebleeds, sinus pain and sore throat.   Eyes: Negative for double vision, photophobia, pain, discharge and redness.  Respiratory: Negative for cough, hemoptysis, sputum production, shortness of breath and wheezing.   Cardiovascular: Negative for chest pain, palpitations, orthopnea, claudication and leg swelling.  Gastrointestinal: Negative for abdominal pain, blood in stool, constipation, diarrhea, heartburn, melena, nausea and vomiting.  Genitourinary: Negative for  dysuria, flank pain, frequency, hematuria and urgency.  Musculoskeletal: Negative for back pain, joint pain, myalgias and neck pain.  Skin: Negative for itching and rash.  Neurological: Negative for dizziness, tingling, tremors, focal weakness, weakness and headaches.  Endo/Heme/Allergies: Negative for environmental allergies. Does not bruise/bleed easily.  Psychiatric/Behavioral: Negative for depression and hallucinations. The patient is not nervous/anxious and does not have insomnia.      MEDICAL HISTORY:  Past Medical History:  Diagnosis Date  . Arthritis    hands  . Cataract   . Claustrophobia   . Depression    with anxiety/h/o agoraphobia  . Glaucoma   . [place]    left hip  . HOH (hard of hearing)   . Hyperlipidemia   . Hypertension   . [place]   . PONV (postoperative nausea and vomiting)     SURGICAL HISTORY: Past Surgical History:  Procedure Laterality Date  . CATARACT EXTRACTION W/PHACO Right 03/15/2015   Procedure: CATARACT EXTRACTION PHACO AND INTRAOCULAR LENS PLACEMENT (IOC);  Surgeon: ARonnell Freshwater MD;  [facility]  Service: Ophthalmology;  Laterality: Right;  . INTRAMEDULLARY (IM) NAIL INTERTROCHANTERIC Left 05/25/2017   Procedure: INTRAMEDULLARY (IM) NAIL INTERTROCHANTRIC;  Surgeon: KThornton Park MD;  Location: [facility] ORS;  Service: Orthopedics;  Laterality: Left;  . ROTATOR CUFF REPAIR  1999   bilateral    SOCIAL HISTORY: Social History   Socioeconomic History  . Marital status: Married    Spouse name: Not on file  . Number of children: Not on file  . Years of education: Not on file  . Highest education level: Not on file  Occupational History  . Not on file  Social Needs  . Financial resource strain:  Not on file  . Food insecurity:    Worry: Not on file    Inability: Not on file  . Transportation needs:    Medical: Not on file    Non-medical: Not on file  Tobacco Use  . Smoking status:  Never Smoker  . Smokeless tobacco: Never Used  Substance and Sexual Activity  . Alcohol use: Yes    Alcohol/week: 4.2 oz    Types: 7 Glasses of wine per week  . Drug use: No  . Sexual activity: Not Currently  Lifestyle  . Physical activity:    Days per week: Not on file    Minutes per session: Not on file  . Stress: Not on file  Relationships  . Social connections:    Talks on phone: Not on file    Gets together: Not on file    Attends religious service: Not on file    Active member of club or organization: Not on file    Attends meetings of clubs or organizations: Not on file    Relationship status: Not on file  . Intimate partner violence:    Fear of current or ex partner: Not on file    Emotionally abused: Not on file    Physically abused: Not on file    Forced sexual activity: Not on file  Other Topics Concern  . Not on file  Social History Narrative  . Not on file    FAMILY HISTORY: Family History  Problem Relation Age of Onset  . Cancer - Other Son        appendix cancer  . Prostate cancer Brother   . CAD Brother   . Kidney cancer Father   . Breast cancer Sister   . Stroke Sister     ALLERGIES:  is allergic to codeine and penicillins.  MEDICATIONS:  Current Outpatient Medications  Medication Sig Dispense Refill  . acetaminophen (TYLENOL) 325 MG tablet Take 2 tablets (650 mg total) by mouth every 6 (six) hours as needed for mild pain, moderate pain or fever. 30 tablet 2  . amLODipine (NORVASC) 5 MG tablet Take 1 tablet (5 mg total) by mouth daily. 30 tablet 0  . aspirin EC 81 MG tablet Take 1 tablet (81 mg total) by mouth daily. 30 tablet 1  . ATORVASTATIN CALCIUM PO Take 5 mg by mouth daily.     . calcium gluconate 500 MG tablet Take 1 tablet by mouth 2 (two) times daily.    . Cholecalciferol (VITAMIN D) 2000 units tablet Take 1 tablet by mouth daily.    Marland Kitchen dexamethasone (DECADRON) 4 MG tablet Taking Monday, Tuesday, Wednesday.    . lenalidomide (REVLIMID)  5 MG capsule Take 1 capsule (5 mg total) by mouth daily. Take for 14 days on, then 7 days off. 14 capsule 0  . levothyroxine (SYNTHROID, LEVOTHROID) 25 MCG tablet Take 1 tablet (25 mcg total) by mouth daily before breakfast. 30 tablet 1  . metoprolol tartrate (LOPRESSOR) 25 MG tablet Take 1 tablet (25 mg total) by mouth 2 (two) times daily. 60 tablet 1  . PARoxetine (PAXIL) 40 MG tablet Take 40 mg by mouth at bedtime.    . polyethylene glycol (MIRALAX / GLYCOLAX) packet Take 17 g by mouth daily. 14 each 0  . senna-docusate (SENOKOT-S) 8.6-50 MG tablet Take 2 tablets by mouth at bedtime as needed for mild constipation.     . traZODone (DESYREL) 50 MG tablet Take 25 mg by mouth at bedtime.     Marland Kitchen  vitamin B-12 (CYANOCOBALAMIN) 1000 MCG tablet Take 1,000 mcg by mouth daily.    . feeding supplement, ENSURE ENLIVE, (ENSURE ENLIVE) LIQD Take 237 mLs by mouth 2 (two) times daily between meals. (Patient not taking: Reported on 06/15/2017) 237 mL 12  . traMADol (ULTRAM) 50 MG tablet Take 1 tablet (50 mg total) by mouth every 6 (six) hours as needed for moderate pain or severe pain. (Patient not taking: Reported on 07/12/2017) 30 tablet 0   No current facility-administered medications for this visit.       Marland Kitchen  PHYSICAL EXAMINATION: ECOG PERFORMANCE STATUS: 2 - Symptomatic, <50% confined to bed  Vitals:   07/12/17 1426  BP: 120/78  Pulse: 83  Resp: 18  Temp: 97.9 F (36.6 C)   Filed Weights   07/12/17 1426  Weight: 114 lb 3.2 oz (51.8 kg)   .Physical Exam  Constitutional: She is oriented to person, place, and time and well-developed, well-nourished, and in no distress. No distress.  Frail-appearing Caucasian female patient alert, no distress and comfortable  HENT:  Head: Normocephalic and atraumatic.  Nose: Nose normal.  Mouth/Throat: Oropharynx is clear and moist. No oropharyngeal exudate.  Eyes: Pupils are equal, round, and reactive to light. EOM are normal. Left eye exhibits no discharge.  No scleral icterus.  Neck: Normal range of motion. Neck supple. No JVD present.  Cardiovascular: Normal rate, regular rhythm and normal heart sounds.  No murmur heard. Pulmonary/Chest: Effort normal and breath sounds normal. No respiratory distress. She has no wheezes. She has no rales. She exhibits no tenderness.  Abdominal: Soft. Bowel sounds are normal. She exhibits no distension and no mass. There is no tenderness. There is no rebound.  Musculoskeletal: Normal range of motion. She exhibits no edema or tenderness.  Lymphadenopathy:    She has no cervical adenopathy.  Neurological: She is alert and oriented to person, place, and time. No cranial nerve deficit. She exhibits normal muscle tone. Coordination normal.  Skin: Skin is warm and dry. No rash noted. She is not diaphoretic. No erythema.  Psychiatric: Affect and judgment normal.   LABORATORY DATA:  I have reviewed the data as listed Lab Results  Component Value Date   WBC 8.3 07/12/2017   HGB 9.5 (L) 07/12/2017   HCT 28.4 (L) 07/12/2017   MCV 98.4 07/12/2017   PLT 296 07/12/2017   Recent Labs    06/15/17 1329 07/02/17 1400 07/12/17 1403  NA 135 138 137  K 4.2 4.2 3.7  CL 100* 103 104  CO2 '23 23 23  ' GLUCOSE 189* 116* 136*  BUN 14 14 28*  CREATININE 1.05* 1.02* 1.00  CALCIUM 8.2* 8.5* 8.3*  GFRNONAA 47* 48* 50*  GFRAA 54* 56* 57*  PROT 8.8* 9.0* 7.7  ALBUMIN 2.4* 2.8* 2.8*  AST '21 19 23  ' ALT 11* 12* 25  ALKPHOS 134* 119 83  BILITOT 0.8 1.2 1.0   SPEP IgA 5027 M spike 3.4 Kappa light chain 319, kappa/lamda ration 199.44.    RADIOGRAPHIC STUDIES: I have personally reviewed the radiological images as listed and agreed with the findings in the report. # Skeletal Bone survey.  Multiple rounded lytic lesions in the skull, superimpsed on background of diffusely mottled lucency. No spinal lytic lesions.  Heterogenous lucency within the proximal humeri, radii, and femora.   ASSESSMENT & PLAN:  84 yo female with  mild/moderate dementia presents for mangement of newly diagnosed IgA multiple myeloma.  1. Multiple myeloma in relapse (HCC)   2. Stage 3 chronic  kidney disease ([place])   3. Anemia in neoplastic disease    # Multiple myeloma, IgA Kappa. Labs reviewed. Tolerates current treatment well.  Continue Dexamethasone 27m Monday Tuesday Wednesday [ 3x weekly] Continue Revlimid 558md1-14 q 21 days.  Aspirin 8134maily for DVT prophylaxis.  Follow up in 2 weeks to see Dr.Brahmanday.  Repeat cbc, cmp, multiple myeloma labs.   # Anemia: stable.   # CKD: kidney function stable. Avoid nephrotoxin.  All questions were answered. The patient knows to call the clinic with any problems, questions or concerns. Total face to face encounter time for this patient visit was 25 min. >50% of the time was  spent in counseling and coordination of care.    ZhoEarlie ServerD, PhD Hematology Oncology [facility] [id number]

## 2017-07-16 ENCOUNTER — Telehealth: Payer: Self-pay | Admitting: *Deleted

## 2017-07-16 NOTE — Telephone Encounter (Signed)
Spoke with [name] and she was satisfied with physician response

## 2017-07-16 NOTE — Telephone Encounter (Signed)
Per Dr. Rogue Bussing - MD states increase urinary output is not related to revlimid.

## 2017-07-16 NOTE — Telephone Encounter (Signed)
Daughter called to report that patient has had a marked increase in urine output since starting the Revlimid. She asks if this is of concern or not. Please return her call [phone]

## 2017-07-19 ENCOUNTER — Telehealth: Payer: Self-pay | Admitting: Pharmacy Technician

## 2017-07-19 NOTE — Telephone Encounter (Signed)
Oral Oncology Patient Advocate Encounter  Patient was approved by Patient [place] (PAF) for a grant to cover copayment of Revlimid.  I have faxed the Diagnosis Verification Form to [phone] for completion of grant approval.    [place] [facility] [place] Phone [phone] Fax [phone] 07/19/2017 10:55 AM

## 2017-07-20 ENCOUNTER — Other Ambulatory Visit: Payer: Self-pay | Admitting: *Deleted

## 2017-07-20 DIAGNOSIS — M25551 Pain in right hip: Secondary | ICD-10-CM | POA: Diagnosis not present

## 2017-07-20 DIAGNOSIS — C9002 Multiple myeloma in relapse: Secondary | ICD-10-CM

## 2017-07-20 MED ORDER — LENALIDOMIDE 5 MG PO CAPS: 5.0000 mg | ORAL_CAPSULE | Freq: Every day | ORAL | 0 refills | Status: DC

## 2017-07-20 NOTE — Patient Outreach (Signed)
Transition of care call made to pt's daughter, [name]. I was not able to reach her today but left a message and requested a call back to give me an update.  Theresa Lynch. [name], MSN, [facility] Gerontological Nurse Practitioner [facility] Care Management [phone]

## 2017-07-27 ENCOUNTER — Inpatient Hospital Stay: Payer: PPO | Admitting: Internal Medicine

## 2017-07-27 ENCOUNTER — Other Ambulatory Visit: Payer: Self-pay | Admitting: *Deleted

## 2017-07-27 ENCOUNTER — Encounter: Payer: Self-pay | Admitting: Internal Medicine

## 2017-07-27 ENCOUNTER — Inpatient Hospital Stay: Payer: PPO | Attending: Internal Medicine

## 2017-07-27 VITALS — BP 118/68 | HR 84 | Temp 98.1°F | Resp 16 | Wt 111.8 lb

## 2017-07-27 DIAGNOSIS — Z79899 Other long term (current) drug therapy: Secondary | ICD-10-CM | POA: Diagnosis not present

## 2017-07-27 DIAGNOSIS — E46 Unspecified protein-calorie malnutrition: Secondary | ICD-10-CM | POA: Insufficient documentation

## 2017-07-27 DIAGNOSIS — D649 Anemia, unspecified: Secondary | ICD-10-CM | POA: Insufficient documentation

## 2017-07-27 DIAGNOSIS — Z7982 Long term (current) use of aspirin: Secondary | ICD-10-CM | POA: Insufficient documentation

## 2017-07-27 DIAGNOSIS — C9002 Multiple myeloma in relapse: Secondary | ICD-10-CM | POA: Insufficient documentation

## 2017-07-27 DIAGNOSIS — E785 Hyperlipidemia, unspecified: Secondary | ICD-10-CM | POA: Diagnosis not present

## 2017-07-27 DIAGNOSIS — I10 Essential (primary) hypertension: Secondary | ICD-10-CM | POA: Diagnosis not present

## 2017-07-27 DIAGNOSIS — F329 Major depressive disorder, single episode, unspecified: Secondary | ICD-10-CM | POA: Insufficient documentation

## 2017-07-27 DIAGNOSIS — F0390 Unspecified dementia without behavioral disturbance: Secondary | ICD-10-CM | POA: Diagnosis not present

## 2017-07-27 DIAGNOSIS — H409 Unspecified glaucoma: Secondary | ICD-10-CM | POA: Diagnosis not present

## 2017-07-27 DIAGNOSIS — R197 Diarrhea, unspecified: Secondary | ICD-10-CM | POA: Diagnosis not present

## 2017-07-27 LAB — CBC WITH DIFFERENTIAL/PLATELET
BASOS ABS: 0 10*3/uL (ref 0–0.1)
Basophils Relative: 1 %
Eosinophils Absolute: 0 10*3/uL (ref 0–0.7)
Eosinophils Relative: 0 %
HEMATOCRIT: 30.1 % — AB (ref 35.0–47.0)
Hemoglobin: 10 g/dL — ABNORMAL LOW (ref 12.0–16.0)
LYMPHS ABS: 1.3 10*3/uL (ref 1.0–3.6)
LYMPHS PCT: 19 %
MCH: 32.3 pg (ref 26.0–34.0)
MCHC: 33.2 g/dL (ref 32.0–36.0)
MCV: 97.3 fL (ref 80.0–100.0)
MONO ABS: 1.1 10*3/uL — AB (ref 0.2–0.9)
Monocytes Relative: 17 %
NEUTROS ABS: 4 10*3/uL (ref 1.4–6.5)
Neutrophils Relative %: 63 %
Platelets: 448 10*3/uL — ABNORMAL HIGH (ref 150–440)
RBC: 3.09 MIL/uL — ABNORMAL LOW (ref 3.80–5.20)
RDW: 18.5 % — ABNORMAL HIGH (ref 11.5–14.5)
WBC: 6.4 10*3/uL (ref 3.6–11.0)

## 2017-07-27 LAB — COMPREHENSIVE METABOLIC PANEL
ALK PHOS: 71 U/L (ref 38–126)
ALT: 24 U/L (ref 0–44)
ANION GAP: 12 (ref 5–15)
AST: 24 U/L (ref 15–41)
Albumin: 2.9 g/dL — ABNORMAL LOW (ref 3.5–5.0)
BILIRUBIN TOTAL: 0.7 mg/dL (ref 0.3–1.2)
BUN: 29 mg/dL — ABNORMAL HIGH (ref 8–23)
CALCIUM: 8.6 mg/dL — AB (ref 8.9–10.3)
CO2: 21 mmol/L — AB (ref 22–32)
CREATININE: 1.04 mg/dL — AB (ref 0.44–1.00)
Chloride: 103 mmol/L (ref 98–111)
GFR calc non Af Amer: 47 mL/min — ABNORMAL LOW (ref >60)
GFR, EST AFRICAN AMERICAN: 55 mL/min — AB (ref >60)
Glucose, Bld: 84 mg/dL (ref 70–99)
Potassium: 3.5 mmol/L (ref 3.5–5.1)
SODIUM: 136 mmol/L (ref 135–145)
TOTAL PROTEIN: 7.4 g/dL (ref 6.5–8.1)

## 2017-07-27 NOTE — Progress Notes (Signed)
[place] OFFICE PROGRESS NOTE  Patient Care Team: [name], MD as PCP - General (Family Medicine) Deloria Lair, NP as [place] No matching staging information was found for the patient.   Oncology History   # MAY 2019- MULTIPLE MYELOMA [No BMBx]; IgA-K [baseline- 3.4 gm/dl; ]  # Hypercalcemia- s/p Zometa 41m IVP [May 2019]  # Mod dementia; s/p Left hip Fx [may 2019]  -------------------------------------------------------------------------   DIAGNOSIS: [MAY 2019]- ACTIVE MULTIPLE MYELOMA  STAGE:  II  ; GOALS: PALLIATIVE  CURRENT/MOST RECENT THERAPY: Dex 4674m3/w; Rev 74m22mw-On/1w-OFF      [place]    INTERVAL HISTORY: Please note patient has mild to moderate dementia; limited history of complete by daughter.  [name] 18o.  female pleasant patient above history of active multiple myeloma currently on dexamethasone Revlimid is here for follow-up.  As per the daughter patient overall seems to be improving.  Appetite improved.  No diarrhea.  No skin rash.  However overall feels weak.  Her activity is limited.  Review of Systems  Constitutional: Positive for malaise/fatigue. Negative for chills, diaphoresis, fever and weight loss.  HENT: Negative for nosebleeds and sore throat.   Eyes: Negative for double vision.  Respiratory: Negative for cough, hemoptysis, sputum production, shortness of breath and wheezing.   Cardiovascular: Negative for chest pain, palpitations, orthopnea and leg swelling.  Gastrointestinal: Negative for abdominal pain, blood in stool, constipation, diarrhea, heartburn, melena, nausea and vomiting.  Genitourinary: Negative for dysuria, frequency and urgency.  Musculoskeletal: Negative for back pain and joint pain.  Skin: Negative.  Negative for itching and rash.  Neurological: Negative for dizziness, tingling, focal weakness, weakness  and headaches.  Endo/Heme/Allergies: Does not bruise/bleed easily.  Psychiatric/Behavioral: Positive for memory loss. Negative for depression. The patient is not nervous/anxious and does not have insomnia.       PAST MEDICAL HISTORY :  Past Medical History:  Diagnosis Date  . Arthritis    hands  . Cataract   . Claustrophobia   . Depression    with anxiety/h/o agoraphobia  . Glaucoma   . [place] ([place]  left hip  . HOH (hard of hearing)   . Hyperlipidemia   . Hypertension   . Multiple myeloma ([place] . PONV (postoperative nausea and vomiting)     PAST SURGICAL HISTORY :   Past Surgical History:  Procedure Laterality Date  . CATARACT EXTRACTION W/PHACO Right 03/15/2015   Procedure: CATARACT EXTRACTION PHACO AND INTRAOCULAR LENS PLACEMENT (IOC);  Surgeon: AniRonnell FreshwaterD;  [place];  Laterality: Right;  . INTRAMEDULLARY (IM) NAIL INTERTROCHANTERIC Left 05/25/2017   Procedure: INTRAMEDULLARY (IM) NAIL INTERTROCHANTRIC;  Surgeon: KraThornton ParkD;  Location: [facility] ORS;  Service: Orthopedics;  Laterality: Left;  . ROTATOR CUFF REPAIR  1999   bilateral    FAMILY HISTORY :   Family History  Problem Relation Age of Onset  . Cancer - Other Son        appendix cancer  . Prostate cancer Brother   . CAD Brother   . Kidney cancer Father   . Breast cancer Sister   . Stroke Sister     SOCIAL HISTORY:   Social History   Tobacco Use  . Smoking status: Never Smoker  . Smokeless tobacco: Never Used  Substance Use Topics  . Alcohol use: Yes    Alcohol/week:  4.2 oz    Types: 7 Glasses of wine per week  . Drug use: No    ALLERGIES:  is allergic to codeine and penicillins.  MEDICATIONS:  Current Outpatient Medications  Medication Sig Dispense Refill  . acetaminophen (TYLENOL) 325 MG tablet Take 2 tablets (650 mg total) by mouth every 6 (six) hours as needed for mild pain, moderate pain or fever. 30 tablet 2  .  amLODipine (NORVASC) 5 MG tablet Take 1 tablet (5 mg total) by mouth daily. 30 tablet 0  . aspirin EC 81 MG tablet Take 1 tablet (81 mg total) by mouth daily. 30 tablet 1  . ATORVASTATIN CALCIUM PO Take 5 mg by mouth daily.     . calcium gluconate 500 MG tablet Take 1 tablet by mouth 2 (two) times daily.    . Cholecalciferol (VITAMIN D) 2000 units tablet Take 1 tablet by mouth daily.    Marland Kitchen dexamethasone (DECADRON) 4 MG tablet Taking Monday, Tuesday, Wednesday.    . feeding supplement, ENSURE ENLIVE, (ENSURE ENLIVE) LIQD Take 237 mLs by mouth 2 (two) times daily between meals. 237 mL 12  . lenalidomide (REVLIMID) 5 MG capsule Take 1 capsule (5 mg total) by mouth daily. Take for 14 days on, then 7 days off. 14 capsule 0  . levothyroxine (SYNTHROID, LEVOTHROID) 25 MCG tablet Take 1 tablet (25 mcg total) by mouth daily before breakfast. 30 tablet 1  . metoprolol tartrate (LOPRESSOR) 25 MG tablet Take 1 tablet (25 mg total) by mouth 2 (two) times daily. 60 tablet 1  . PARoxetine (PAXIL) 40 MG tablet Take 40 mg by mouth at bedtime.    . polyethylene glycol (MIRALAX / GLYCOLAX) packet Take 17 g by mouth daily. 14 each 0  . senna-docusate (SENOKOT-S) 8.6-50 MG tablet Take 2 tablets by mouth at bedtime as needed for mild constipation.     . traMADol (ULTRAM) 50 MG tablet Take 1 tablet (50 mg total) by mouth every 6 (six) hours as needed for moderate pain or severe pain. 30 tablet 0  . traZODone (DESYREL) 50 MG tablet Take 25 mg by mouth at bedtime.     . vitamin B-12 (CYANOCOBALAMIN) 1000 MCG tablet Take 1,000 mcg by mouth daily.     No current facility-administered medications for this visit.     PHYSICAL EXAMINATION: ECOG PERFORMANCE STATUS: 3 - Symptomatic, >50% confined to bed  BP 118/68 (BP Location: Left Arm, Patient Position: Sitting)   Pulse 84   Temp 98.1 F (36.7 C) (Tympanic)   Resp 16   Wt 111 lb 12.8 oz (50.7 kg)   BMI 18.04 kg/m   Filed Weights   07/27/17 1219  Weight: 111 lb  12.8 oz (50.7 kg)    GENERAL: Well-nourished well-developed; Alert, no distress and comfortable.  She is in a wheelchair.  Accompanied by daughter. EYES: no pallor or icterus OROPHARYNX: no thrush or ulceration; NECK: supple; no lymph nodes felt. LYMPH:  no palpable lymphadenopathy in the axillary or inguinal regions LUNGS: Decreased breath sounds auscultation bilaterally. No wheeze or crackles HEART/CVS: regular rate & rhythm and no murmurs; No lower extremity edema ABDOMEN:abdomen soft, non-tender and normal bowel sounds. No hepatomegaly or splenomegaly.  Musculoskeletal:no cyanosis of digits and no clubbing  PSYCH: alert & oriented x 3 with fluent speech NEURO: no focal motor/sensory deficits SKIN:  no rashes or significant lesions    LABORATORY DATA:  I have reviewed the data as listed    Component Value Date/Time   NA 136  07/27/2017 1137   K 3.5 07/27/2017 1137   CL 103 07/27/2017 1137   CO2 21 (L) 07/27/2017 1137   GLUCOSE 84 07/27/2017 1137   BUN 29 (H) 07/27/2017 1137   CREATININE 1.04 (H) 07/27/2017 1137   CALCIUM 8.6 (L) 07/27/2017 1137   CALCIUM 10.1 05/26/2017 0419   PROT 7.4 07/27/2017 1137   ALBUMIN 2.9 (L) 07/27/2017 1137   AST 24 07/27/2017 1137   ALT 24 07/27/2017 1137   ALKPHOS 71 07/27/2017 1137   BILITOT 0.7 07/27/2017 1137   GFRNONAA 47 (L) 07/27/2017 1137   GFRAA 55 (L) 07/27/2017 1137    No results found for: SPEP, UPEP  Lab Results  Component Value Date   WBC 6.4 07/27/2017   NEUTROABS 4.0 07/27/2017   HGB 10.0 (L) 07/27/2017   HCT 30.1 (L) 07/27/2017   MCV 97.3 07/27/2017   PLT 448 (H) 07/27/2017      Chemistry      Component Value Date/Time   NA 136 07/27/2017 1137   K 3.5 07/27/2017 1137   CL 103 07/27/2017 1137   CO2 21 (L) 07/27/2017 1137   BUN 29 (H) 07/27/2017 1137   CREATININE 1.04 (H) 07/27/2017 1137      Component Value Date/Time   CALCIUM 8.6 (L) 07/27/2017 1137   CALCIUM 10.1 05/26/2017 0419   ALKPHOS 71 07/27/2017  1137   AST 24 07/27/2017 1137   ALT 24 07/27/2017 1137   BILITOT 0.7 07/27/2017 1137       RADIOGRAPHIC STUDIES: I have personally reviewed the radiological images as listed and agreed with the findings in the report. No results found.   ASSESSMENT & PLAN:  Multiple myeloma in relapse ([place]) #Multiple myeloma -IgA kappa; symptomatic.  Stage II.  Currently on dexamethasone 4 mg 3 times a week;Revlimid 5 mg 2 weeks on 1 week off.  #Clinically improving; no major side effects noted.  Continue current therapy/current dose.  #Anemia hemoglobin 8.8; second multiple myeloma; hemoglobin today improved to 10.   # Constipation-improved.  #Hypercalcemia-status post Zometa in the hospital; currently calcium 8.2; hold Zometa at this time.  #Dementia stable  # follow up in 3 weeks/labs- cbc/cmp.    Orders Placed This Encounter  Procedures  . CBC with Differential/Platelet    Standing Status:   Future    Standing Expiration Date:   07/28/2018  . Comprehensive metabolic panel    Standing Status:   Future    Standing Expiration Date:   07/28/2018   All questions were answered. The patient knows to call the clinic with any problems, questions or concerns.      Cammie Sickle, MD 07/28/2017 9:48 AM

## 2017-07-27 NOTE — Patient Outreach (Signed)
Transition of care call. I spoke to Theresa Lynch today, pt's daughter. She is getting ready to take her to lab work to see if she is OK to take her Revlamid, oral chemothearpeutic agent. [name] says she is doing fair and is well supported by family members. I asked if there is anything I can do to enhance her care or quality of life and she says they are good at this time. We did talk about getting a w/c if her condition declines and I advised her how [employer] will cover that cost.  I will call again next week. She is appreciative of the frequent check in calls.  [name]. [name], MSN, [facility] Gerontological Nurse Practitioner [facility] Care Management [phone]

## 2017-07-27 NOTE — Assessment & Plan Note (Addendum)
#  Multiple myeloma -IgA kappa; symptomatic.  Stage II.  Currently on dexamethasone 4 mg 3 times a week;Revlimid 5 mg 2 weeks on 1 week off.  #Clinically improving; no major side effects noted.  Continue current therapy/current dose.  #Anemia hemoglobin 8.8; second multiple myeloma; hemoglobin today improved to 10.   # Constipation-improved.  #Hypercalcemia-status post Zometa in the hospital; currently calcium 8.2; hold Zometa at this time.  #Dementia stable  # follow up in 3 weeks/labs- cbc/cmp.

## 2017-07-30 LAB — MULTIPLE MYELOMA PANEL, SERUM
ALBUMIN/GLOB SERPL: 0.8 (ref 0.7–1.7)
ALPHA2 GLOB SERPL ELPH-MCNC: 0.7 g/dL (ref 0.4–1.0)
Albumin SerPl Elph-Mcnc: 3 g/dL (ref 2.9–4.4)
Alpha 1: 0.2 g/dL (ref 0.0–0.4)
B-GLOBULIN SERPL ELPH-MCNC: 2.7 g/dL — AB (ref 0.7–1.3)
GAMMA GLOB SERPL ELPH-MCNC: 0.3 g/dL — AB (ref 0.4–1.8)
GLOBULIN, TOTAL: 4 g/dL — AB (ref 2.2–3.9)
IGG (IMMUNOGLOBIN G), SERUM: 186 mg/dL — AB (ref 700–1600)
IgA: 2785 mg/dL — ABNORMAL HIGH (ref 64–422)
IgM (Immunoglobulin M), Srm: 8 mg/dL — ABNORMAL LOW (ref 26–217)
M PROTEIN SERPL ELPH-MCNC: 1.9 g/dL — AB
TOTAL PROTEIN ELP: 7 g/dL (ref 6.0–8.5)

## 2017-07-30 LAB — KAPPA/LAMBDA LIGHT CHAINS
KAPPA FREE LGHT CHN: 351.6 mg/L — AB (ref 3.3–19.4)
KAPPA, LAMDA LIGHT CHAIN RATIO: 140.64 — AB (ref 0.26–1.65)
LAMDA FREE LIGHT CHAINS: 2.5 mg/L — AB (ref 5.7–26.3)

## 2017-08-03 ENCOUNTER — Other Ambulatory Visit: Payer: Self-pay | Admitting: *Deleted

## 2017-08-03 NOTE — Patient Outreach (Signed)
Transition of care call. Talked with daughter and got update on pt condition which is stable and she was able to continue her oral chemotherapy. She continues to walk with her walker and has not had any falls. [name] knows she can call me if any issue arises. Reinforced safety precautions.  I will call again in 2 weeks to follow up.  [name]. [name], MSN, [facility] Gerontological Nurse Practitioner [facility] Care Management [phone]

## 2017-08-13 ENCOUNTER — Other Ambulatory Visit: Payer: Self-pay | Admitting: *Deleted

## 2017-08-13 DIAGNOSIS — C9002 Multiple myeloma in relapse: Secondary | ICD-10-CM

## 2017-08-13 NOTE — Telephone Encounter (Signed)
Theresa Lynch [phone]

## 2017-08-14 MED ORDER — LENALIDOMIDE 5 MG PO CAPS: 5.0000 mg | ORAL_CAPSULE | Freq: Every day | ORAL | 0 refills | Status: DC

## 2017-08-15 ENCOUNTER — Other Ambulatory Visit: Payer: Self-pay | Admitting: *Deleted

## 2017-08-15 NOTE — Patient Outreach (Signed)
Transition of care call made but did not talk to [facility], pt's daughter as usual. I left a message to return my call.  [name]. [name], MSN, [facility] Gerontological Nurse Practitioner [facility] Care Management [phone]

## 2017-08-20 ENCOUNTER — Inpatient Hospital Stay: Payer: PPO

## 2017-08-20 ENCOUNTER — Encounter: Payer: Self-pay | Admitting: Internal Medicine

## 2017-08-20 ENCOUNTER — Other Ambulatory Visit: Payer: Self-pay

## 2017-08-20 ENCOUNTER — Inpatient Hospital Stay (HOSPITAL_BASED_OUTPATIENT_CLINIC_OR_DEPARTMENT_OTHER): Payer: PPO | Admitting: Internal Medicine

## 2017-08-20 VITALS — BP 121/79 | HR 82 | Temp 97.9°F | Resp 18 | Ht 66.0 in | Wt 110.2 lb

## 2017-08-20 DIAGNOSIS — F0390 Unspecified dementia without behavioral disturbance: Secondary | ICD-10-CM

## 2017-08-20 DIAGNOSIS — C9002 Multiple myeloma in relapse: Secondary | ICD-10-CM | POA: Diagnosis not present

## 2017-08-20 DIAGNOSIS — D649 Anemia, unspecified: Secondary | ICD-10-CM

## 2017-08-20 DIAGNOSIS — R197 Diarrhea, unspecified: Secondary | ICD-10-CM

## 2017-08-20 DIAGNOSIS — Z79899 Other long term (current) drug therapy: Secondary | ICD-10-CM

## 2017-08-20 DIAGNOSIS — E46 Unspecified protein-calorie malnutrition: Secondary | ICD-10-CM | POA: Diagnosis not present

## 2017-08-20 LAB — CBC WITH DIFFERENTIAL/PLATELET
Basophils Absolute: 0.1 10*3/uL (ref 0–0.1)
Basophils Relative: 1 %
Eosinophils Absolute: 0.3 10*3/uL (ref 0–0.7)
Eosinophils Relative: 5 %
HEMATOCRIT: 30.9 % — AB (ref 35.0–47.0)
Hemoglobin: 9.9 g/dL — ABNORMAL LOW (ref 12.0–16.0)
LYMPHS ABS: 0.7 10*3/uL — AB (ref 1.0–3.6)
LYMPHS PCT: 12 %
MCH: 29.8 pg (ref 26.0–34.0)
MCHC: 31.9 g/dL — AB (ref 32.0–36.0)
MCV: 93.4 fL (ref 80.0–100.0)
MONO ABS: 0.6 10*3/uL (ref 0.2–0.9)
MONOS PCT: 10 %
NEUTROS ABS: 4.3 10*3/uL (ref 1.4–6.5)
Neutrophils Relative %: 72 %
Platelets: 503 10*3/uL — ABNORMAL HIGH (ref 150–440)
RBC: 3.31 MIL/uL — ABNORMAL LOW (ref 3.80–5.20)
RDW: 18.4 % — AB (ref 11.5–14.5)
WBC: 6 10*3/uL (ref 3.6–11.0)

## 2017-08-20 LAB — COMPREHENSIVE METABOLIC PANEL
ALBUMIN: 2.8 g/dL — AB (ref 3.5–5.0)
ALK PHOS: 72 U/L (ref 38–126)
ALT: 13 U/L (ref 0–44)
ANION GAP: 11 (ref 5–15)
AST: 16 U/L (ref 15–41)
BUN: 22 mg/dL (ref 8–23)
CO2: 24 mmol/L (ref 22–32)
Calcium: 8.6 mg/dL — ABNORMAL LOW (ref 8.9–10.3)
Chloride: 101 mmol/L (ref 98–111)
Creatinine, Ser: 1.16 mg/dL — ABNORMAL HIGH (ref 0.44–1.00)
GFR calc Af Amer: 48 mL/min — ABNORMAL LOW (ref >60)
GFR calc non Af Amer: 41 mL/min — ABNORMAL LOW (ref >60)
Glucose, Bld: 97 mg/dL (ref 70–99)
POTASSIUM: 4.6 mmol/L (ref 3.5–5.1)
SODIUM: 136 mmol/L (ref 135–145)
Total Bilirubin: 1.2 mg/dL (ref 0.3–1.2)
Total Protein: 7.3 g/dL (ref 6.5–8.1)

## 2017-08-20 NOTE — Progress Notes (Signed)
[place] OFFICE PROGRESS NOTE  Patient Care Team: [name], MD as PCP - General (Family Medicine) Deloria Lair, NP as [place] No matching staging information was found for the patient.   Oncology History   # MAY 2019- MULTIPLE MYELOMA [No BMBx]; IgA-K [baseline- 3.4 gm/dl; ]  # Hypercalcemia- s/p Zometa 76m IVP [May 2019]  # Mod dementia; s/p Left hip Fx [may 2019]  -------------------------------------------------------------------------   DIAGNOSIS: [MAY 2019]- ACTIVE MULTIPLE MYELOMA  STAGE:  II  ; GOALS: PALLIATIVE  CURRENT/MOST RECENT THERAPY: Dex 464m3/w; Rev 63m79mw-On/1w-OFF      Multiple [place] in relapse ([facility]    INTERVAL HISTORY:  [name] 52o.  female pleasant patient above history of multiple myeloma currently on dexamethasone Revlimid is here for follow-up.  As per the daughter patient is having 1-2 loose stools a day.  Appetite is poor.  She is snacking on peanut butter.  She complains of mild to moderate fatigue.  No blood in stools black or stools.  Continues to have back pain/joint pains which is improved/stable.  Review of Systems  Constitutional: Positive for malaise/fatigue and weight loss. Negative for chills, diaphoresis and fever.  HENT: Negative for nosebleeds and sore throat.   Eyes: Negative for double vision.  Respiratory: Negative for cough, hemoptysis, sputum production, shortness of breath and wheezing.   Cardiovascular: Negative for chest pain, palpitations, orthopnea and leg swelling.  Gastrointestinal: Positive for diarrhea. Negative for abdominal pain, blood in stool, constipation, heartburn, melena, nausea and vomiting.  Genitourinary: Negative for dysuria, frequency and urgency.  Musculoskeletal: Positive for back pain and joint pain.  Skin: Negative.  Negative for itching and rash.  Neurological: Negative for dizziness, tingling, focal  weakness, weakness and headaches.  Endo/Heme/Allergies: Does not bruise/bleed easily.  Psychiatric/Behavioral: Negative for depression. The patient is not nervous/anxious and does not have insomnia.       PAST MEDICAL HISTORY :  Past Medical History:  Diagnosis Date  . Arthritis    hands  . Cataract   . Claustrophobia   . Depression    with anxiety/h/o agoraphobia  . Glaucoma   . [place] ([facility]  left hip  . HOH (hard of hearing)   . Hyperlipidemia   . Hypertension   . Multiple myeloma ([facility] . PONV (postoperative nausea and vomiting)     PAST SURGICAL HISTORY :   Past Surgical History:  Procedure Laterality Date  . CATARACT EXTRACTION W/PHACO Right 03/15/2015   Procedure: CATARACT EXTRACTION PHACO AND INTRAOCULAR LENS PLACEMENT (IOC);  Surgeon: AniRonnell FreshwaterD;  [facility];  Laterality: Right;  . INTRAMEDULLARY (IM) NAIL INTERTROCHANTERIC Left 05/25/2017   Procedure: INTRAMEDULLARY (IM) NAIL INTERTROCHANTRIC;  Surgeon: KraThornton ParkD;  Location: [facility] ORS;  Service: Orthopedics;  Laterality: Left;  . ROTATOR CUFF REPAIR  1999   bilateral    FAMILY HISTORY :   Family History  Problem Relation Age of Onset  . Cancer - Other Son        appendix cancer  . Prostate cancer Brother   . CAD Brother   . Kidney cancer Father   . Breast cancer Sister   . Stroke Sister     SOCIAL HISTORY:   Social History   Tobacco Use  . Smoking status: Never Smoker  . Smokeless tobacco: Never Used  Substance Use Topics  . Alcohol use: Yes  Alcohol/week: 4.2 oz    Types: 7 Glasses of wine per week  . Drug use: No    ALLERGIES:  is allergic to codeine and penicillins.  MEDICATIONS:  Current Outpatient Medications  Medication Sig Dispense Refill  . amLODipine (NORVASC) 5 MG tablet Take 1 tablet (5 mg total) by mouth daily. 30 tablet 0  . aspirin EC 81 MG tablet Take 1 tablet (81 mg total) by mouth daily. 30  tablet 1  . ATORVASTATIN CALCIUM PO Take 5 mg by mouth daily.     . calcium gluconate 500 MG tablet Take 1 tablet by mouth 2 (two) times daily.    . Cholecalciferol (VITAMIN D) 2000 units tablet Take 1 tablet by mouth daily.    Marland Kitchen dexamethasone (DECADRON) 4 MG tablet Taking Monday, Tuesday, Wednesday.    . lenalidomide (REVLIMID) 5 MG capsule Take 1 capsule (5 mg total) by mouth daily. Take for 14 days on, then 7 days off. 14 capsule 0  . levothyroxine (SYNTHROID, LEVOTHROID) 25 MCG tablet Take 1 tablet (25 mcg total) by mouth daily before breakfast. 30 tablet 1  . metoprolol tartrate (LOPRESSOR) 25 MG tablet Take 1 tablet (25 mg total) by mouth 2 (two) times daily. 60 tablet 1  . PARoxetine (PAXIL) 40 MG tablet Take 40 mg by mouth at bedtime.    . traZODone (DESYREL) 50 MG tablet Take 25 mg by mouth at bedtime.     . vitamin B-12 (CYANOCOBALAMIN) 1000 MCG tablet Take 1,000 mcg by mouth daily.    Marland Kitchen acetaminophen (TYLENOL) 325 MG tablet Take 2 tablets (650 mg total) by mouth every 6 (six) hours as needed for mild pain, moderate pain or fever. (Patient not taking: Reported on 08/20/2017) 30 tablet 2  . feeding supplement, ENSURE ENLIVE, (ENSURE ENLIVE) LIQD Take 237 mLs by mouth 2 (two) times daily between meals. (Patient not taking: Reported on 08/20/2017) 237 mL 12  . polyethylene glycol (MIRALAX / GLYCOLAX) packet Take 17 g by mouth daily. (Patient not taking: Reported on 08/20/2017) 14 each 0  . senna-docusate (SENOKOT-S) 8.6-50 MG tablet Take 2 tablets by mouth at bedtime as needed for mild constipation.     . traMADol (ULTRAM) 50 MG tablet Take 1 tablet (50 mg total) by mouth every 6 (six) hours as needed for moderate pain or severe pain. (Patient not taking: Reported on 08/20/2017) 30 tablet 0   No current facility-administered medications for this visit.     PHYSICAL EXAMINATION: ECOG PERFORMANCE STATUS: 2 - Symptomatic, <50% confined to bed  BP 121/79 (Patient Position: Sitting)   Pulse 82    Temp 97.9 F (36.6 C) (Tympanic)   Resp 18   Ht _0  (1.676 m)   Wt 110 lb 3.2 oz (50 kg)   BMI 17.79 kg/m   Filed Weights   08/20/17 1414  Weight: 110 lb 3.2 oz (50 kg)    GENERAL: Theresa Lynch; female patient in a wheelchair.  Accompanied by daughter.  No acute distress. EYES: no pallor or icterus OROPHARYNX: no thrush or ulceration; NECK: supple; no lymph nodes felt. LYMPH:  no palpable lymphadenopathy in the axillary or inguinal regions LUNGS: Decreased breath sounds auscultation bilaterally. No wheeze or crackles HEART/CVS: regular rate & rhythm and no murmurs; No lower extremity edema ABDOMEN:abdomen soft, non-tender and normal bowel sounds. No hepatomegaly or splenomegaly.  Musculoskeletal:no cyanosis of digits and no clubbing  PSYCH: alert & oriented x 3 with fluent speech NEURO: no focal motor/sensory deficits SKIN:  no rashes or  significant lesions    LABORATORY DATA:  I have reviewed the data as listed    Component Value Date/Time   NA 136 08/20/2017 1332   K 4.6 08/20/2017 1332   CL 101 08/20/2017 1332   CO2 24 08/20/2017 1332   GLUCOSE 97 08/20/2017 1332   BUN 22 08/20/2017 1332   CREATININE 1.16 (H) 08/20/2017 1332   CALCIUM 8.6 (L) 08/20/2017 1332   CALCIUM 10.1 05/26/2017 0419   PROT 7.3 08/20/2017 1332   ALBUMIN 2.8 (L) 08/20/2017 1332   AST 16 08/20/2017 1332   ALT 13 08/20/2017 1332   ALKPHOS 72 08/20/2017 1332   BILITOT 1.2 08/20/2017 1332   GFRNONAA 41 (L) 08/20/2017 1332   GFRAA 48 (L) 08/20/2017 1332    No results found for: SPEP, UPEP  Lab Results  Component Value Date   WBC 6.0 08/20/2017   NEUTROABS 4.3 08/20/2017   HGB 9.9 (L) 08/20/2017   HCT 30.9 (L) 08/20/2017   MCV 93.4 08/20/2017   PLT 503 (H) 08/20/2017      Chemistry      Component Value Date/Time   NA 136 08/20/2017 1332   K 4.6 08/20/2017 1332   CL 101 08/20/2017 1332   CO2 24 08/20/2017 1332   BUN 22 08/20/2017 1332   CREATININE 1.16 (H) 08/20/2017 1332       Component Value Date/Time   CALCIUM 8.6 (L) 08/20/2017 1332   CALCIUM 10.1 05/26/2017 0419   ALKPHOS 72 08/20/2017 1332   AST 16 08/20/2017 1332   ALT 13 08/20/2017 1332   BILITOT 1.2 08/20/2017 1332       RADIOGRAPHIC STUDIES: I have personally reviewed the radiological images as listed and agreed with the findings in the report. No results found.   ASSESSMENT & PLAN:  Multiple myeloma in relapse ([place]) #Multiple myeloma -IgA kappa; symptomatic.  Stage II.  Currently on dexamethasone 4 mg 3 times a week;Revlimid 5 mg 2 weeks on 1 week off.  Improving-M-M protein decreasing; no need for any blood transfusions.  #Clinically improving; no major side effects noted except for mild diarrhea.  Continue current therapy/current dose.  #Anemia hemoglobin 8.8; second multiple myeloma; hemoglobin today improved to 9.9.    # diarrhea-new G-1 sec to Revlimid- continue Imodium as needed.  #Hypercalcemia-status post Zometa in the hospital; stable ; with plans of Zometa at next visit  # Malnutrition albumin 2.5 worsening discussed at length regarding increasing protein intake.  #Dementia stable  # follow up in 3 weeks/labs- cbc/cmp/myeloma panel kappa lambda Zometa.   Orders Placed This Encounter  Procedures  . CBC with Differential    Standing Status:   Future    Standing Expiration Date:   08/21/2018  . Comprehensive metabolic panel    Standing Status:   Future    Standing Expiration Date:   08/21/2018  . Multiple Myeloma Panel (SPEP&IFE w/QIG)    Standing Status:   Future    Standing Expiration Date:   08/21/2018  . Kappa/lambda light chains    Standing Status:   Future    Standing Expiration Date:   08/21/2018   All questions were answered. The patient knows to call the clinic with any problems, questions or concerns.      Cammie Sickle, MD 08/20/2017 2:45 PM

## 2017-08-20 NOTE — Assessment & Plan Note (Addendum)
#  Multiple myeloma -IgA kappa; symptomatic.  Stage II.  Currently on dexamethasone 4 mg 3 times a week;Revlimid 5 mg 2 weeks on 1 week off.  Improving-M-M protein decreasing; no need for any blood transfusions.  #Clinically improving; no major side effects noted except for mild diarrhea.  Continue current therapy/current dose.  #Anemia hemoglobin 8.8; second multiple myeloma; hemoglobin today improved to 9.9.    # diarrhea-new G-1 sec to Revlimid- continue Imodium as needed.  #Hypercalcemia-status post Zometa in the hospital; stable ; with plans of Zometa at next visit  # Malnutrition albumin 2.5 worsening discussed at length regarding increasing protein intake.  #Dementia stable  # follow up in 3 weeks/labs- cbc/cmp/myeloma panel kappa lambda Zometa.

## 2017-08-28 ENCOUNTER — Other Ambulatory Visit: Payer: Self-pay | Admitting: Internal Medicine

## 2017-08-31 ENCOUNTER — Encounter: Payer: Self-pay | Admitting: *Deleted

## 2017-08-31 ENCOUNTER — Other Ambulatory Visit: Payer: Self-pay | Admitting: *Deleted

## 2017-08-31 NOTE — Patient Outreach (Signed)
Telephone call for case closure. I spoke with [name] and she says her mothers is doing well at home with family. She is happy and comfortable. She is continuing on low dose chemotherapy tx (oral) and is tolerating this well.  I have advised this is my last call and that I will be available in the future should they need me. [name] expressed her gratefulness for the supportive service.  [facility] CM Care Plan Problem One     Most Recent Value  Care Plan Problem One  Frequent falls  Role Documenting the Problem One  Care Management [place] for Problem One  Not Active  THN CM Short Term Goal #1   Pt will participate fully with PT as reported by family members over the next 30days.  THN CM Short Term Goal #1 Start Date  06/28/17  [facility]N CM Short Term Goal #1 Met Date  07/27/17  [facility] CM Short Term Goal #2   Pt will not fall over the next 30 days per report by family.  THN CM Short Term Goal #2 Start Date  06/28/17  [facility] CM Short Term Goal #2 Met Date  07/27/17    [facility] CM Care Plan Problem Two     Most Recent Value  Care Plan Problem Two  HCPOA, living will and MOST forms are not in medical record.  Role Documenting the Problem Two  Care Management Coordinator  Care Plan for Problem Two  Not Active  THN CM Short Term Goal #1   Family to get a copy of HCPOA and Living will to MD to put in electronic record.  THN CM Short Term Goal #1 Start Date  06/28/17  THN CM Short Term Goal #1 Met Date   07/02/17  THN CM Short Term Goal #2   Family to assist pt to complete a MOST form in the next 30 days.  THN CM Short Term Goal #2 Start Date  06/28/17  [facility] CM Short Term Goal #2 Met Date  07/27/17    [facility] CM Care Plan Problem Three     Most Recent Value  Care Plan Problem Three  Multiple Myeloma  Role Documenting the Problem Three  Care Management Coordinator  Care Plan for Problem Three  Active  THN CM Short Term Goal #1   Pt and or family member to participate in remaining transiton of care calls  over the next month.  THN CM Short Term Goal #1 Start Date  07/27/17  Interventions for Short Term Goal #1  Encouraged [name] to not hesitate to engage with palliative care and or hospice at the time that her mother's condition may take a turn for the worse. She is very supportive of this suggestion.     [name]. [name], MSN, [facility] Gerontological Nurse Practitioner [facility] Care Management [phone]

## 2017-09-10 ENCOUNTER — Inpatient Hospital Stay: Payer: PPO

## 2017-09-10 ENCOUNTER — Inpatient Hospital Stay: Payer: PPO | Attending: Internal Medicine | Admitting: Internal Medicine

## 2017-09-10 VITALS — BP 132/85 | HR 77 | Resp 16 | Wt 113.6 lb

## 2017-09-10 DIAGNOSIS — R197 Diarrhea, unspecified: Secondary | ICD-10-CM | POA: Diagnosis not present

## 2017-09-10 DIAGNOSIS — C9002 Multiple myeloma in relapse: Secondary | ICD-10-CM

## 2017-09-10 DIAGNOSIS — E43 Unspecified severe protein-calorie malnutrition: Secondary | ICD-10-CM

## 2017-09-10 DIAGNOSIS — E785 Hyperlipidemia, unspecified: Secondary | ICD-10-CM | POA: Diagnosis not present

## 2017-09-10 DIAGNOSIS — Z79899 Other long term (current) drug therapy: Secondary | ICD-10-CM | POA: Diagnosis not present

## 2017-09-10 DIAGNOSIS — H409 Unspecified glaucoma: Secondary | ICD-10-CM | POA: Insufficient documentation

## 2017-09-10 DIAGNOSIS — Z7982 Long term (current) use of aspirin: Secondary | ICD-10-CM | POA: Diagnosis not present

## 2017-09-10 DIAGNOSIS — F329 Major depressive disorder, single episode, unspecified: Secondary | ICD-10-CM | POA: Diagnosis not present

## 2017-09-10 DIAGNOSIS — F0390 Unspecified dementia without behavioral disturbance: Secondary | ICD-10-CM | POA: Insufficient documentation

## 2017-09-10 DIAGNOSIS — I10 Essential (primary) hypertension: Secondary | ICD-10-CM | POA: Diagnosis not present

## 2017-09-10 DIAGNOSIS — E46 Unspecified protein-calorie malnutrition: Secondary | ICD-10-CM | POA: Diagnosis not present

## 2017-09-10 LAB — CBC WITH DIFFERENTIAL/PLATELET
BASOS ABS: 0.1 10*3/uL (ref 0–0.1)
Basophils Relative: 1 %
Eosinophils Absolute: 0 10*3/uL (ref 0–0.7)
Eosinophils Relative: 1 %
HEMATOCRIT: 34.4 % — AB (ref 35.0–47.0)
HEMOGLOBIN: 10.8 g/dL — AB (ref 12.0–16.0)
Lymphocytes Relative: 9 %
Lymphs Abs: 0.7 10*3/uL — ABNORMAL LOW (ref 1.0–3.6)
MCH: 29.1 pg (ref 26.0–34.0)
MCHC: 31.4 g/dL — ABNORMAL LOW (ref 32.0–36.0)
MCV: 92.6 fL (ref 80.0–100.0)
Monocytes Absolute: 0.5 10*3/uL (ref 0.2–0.9)
Monocytes Relative: 7 %
NEUTROS ABS: 6.3 10*3/uL (ref 1.4–6.5)
NEUTROS PCT: 82 %
Platelets: 430 10*3/uL (ref 150–440)
RBC: 3.72 MIL/uL — AB (ref 3.80–5.20)
RDW: 19.1 % — ABNORMAL HIGH (ref 11.5–14.5)
WBC: 7.7 10*3/uL (ref 3.6–11.0)

## 2017-09-10 LAB — COMPREHENSIVE METABOLIC PANEL
ALBUMIN: 3.2 g/dL — AB (ref 3.5–5.0)
ALT: 28 U/L (ref 0–44)
ANION GAP: 13 (ref 5–15)
AST: 20 U/L (ref 15–41)
Alkaline Phosphatase: 63 U/L (ref 38–126)
BILIRUBIN TOTAL: 1.2 mg/dL (ref 0.3–1.2)
BUN: 21 mg/dL (ref 8–23)
CALCIUM: 8.9 mg/dL (ref 8.9–10.3)
CO2: 26 mmol/L (ref 22–32)
Chloride: 101 mmol/L (ref 98–111)
Creatinine, Ser: 1.18 mg/dL — ABNORMAL HIGH (ref 0.44–1.00)
GFR, EST AFRICAN AMERICAN: 47 mL/min — AB (ref >60)
GFR, EST NON AFRICAN AMERICAN: 41 mL/min — AB (ref >60)
Glucose, Bld: 104 mg/dL — ABNORMAL HIGH (ref 70–99)
Potassium: 3.8 mmol/L (ref 3.5–5.1)
Sodium: 140 mmol/L (ref 135–145)
TOTAL PROTEIN: 7.4 g/dL (ref 6.5–8.1)

## 2017-09-10 MED ORDER — ZOLEDRONIC ACID 4 MG/5ML IV CONC
3.0000 mg | Freq: Once | INTRAVENOUS | Status: AC
  Administered 2017-09-10: 3 mg via INTRAVENOUS
  Filled 2017-09-10: qty 3.75

## 2017-09-10 NOTE — Progress Notes (Signed)
[place] OFFICE PROGRESS NOTE  Patient Care Team: [name], MD as PCP - General (Family Medicine)  Cancer Staging No matching staging information was found for the patient.   Oncology History   # MAY 2019- MULTIPLE MYELOMA [No BMBx]; IgA-K [baseline- 3.4 gm/dl; ]  # Hypercalcemia- s/p Zometa 64m IVP [May 2019]  # Mod dementia; s/p Left hip Fx [may 2019]  -------------------------------------------------------------------------   DIAGNOSIS: [MAY 2019]- ACTIVE MULTIPLE MYELOMA  STAGE:  II  ; GOALS: PALLIATIVE  CURRENT/MOST RECENT THERAPY: Dex 420m3/w; Rev 54m954mw-On/1w-OFF      [facility] in [place] ([facility]    INTERVAL HISTORY:  SarFrimy Uffelman 78o.  female pleasant patient above history of dementia; also history of multiple myeloma currently on Revlimid dexamethasone is here for follow-up.  Patient's appetite is improving.  No nausea vomiting.  No diarrhea.  No bone pain.  Review of Systems  Constitutional: Negative for chills, diaphoresis, fever, malaise/fatigue and weight loss.  HENT: Negative for nosebleeds and sore throat.   Eyes: Negative for double vision.  Respiratory: Negative for cough, hemoptysis, sputum production, shortness of breath and wheezing.   Cardiovascular: Negative for chest pain, palpitations, orthopnea and leg swelling.  Gastrointestinal: Negative for abdominal pain, blood in stool, constipation, diarrhea, heartburn, melena, nausea and vomiting.  Genitourinary: Negative for dysuria, frequency and urgency.  Musculoskeletal: Negative for back pain and joint pain.  Skin: Negative.  Negative for itching and rash.  Neurological: Negative for dizziness, tingling, focal weakness, weakness and headaches.  Endo/Heme/Allergies: Does not bruise/bleed easily.  Psychiatric/Behavioral: Positive for memory loss. Negative for depression. The patient is not nervous/anxious and does not have insomnia.       PAST MEDICAL  HISTORY :  Past Medical History:  Diagnosis Date  . Arthritis    hands  . Cataract   . Claustrophobia   . Depression    with anxiety/h/o agoraphobia  . Glaucoma   . [place] ([place]  left hip  . HOH (hard of hearing)   . Hyperlipidemia   . Hypertension   . Multiple myeloma ([facility] . PONV (postoperative nausea and vomiting)     PAST SURGICAL HISTORY :   Past Surgical History:  Procedure Laterality Date  . CATARACT EXTRACTION W/PHACO Right 03/15/2015   Procedure: CATARACT EXTRACTION PHACO AND INTRAOCULAR LENS PLACEMENT (IOC);  Surgeon: AniRonnell FreshwaterD;  [facility];  Laterality: Right;  . INTRAMEDULLARY (IM) NAIL INTERTROCHANTERIC Left 05/25/2017   Procedure: INTRAMEDULLARY (IM) NAIL INTERTROCHANTRIC;  Surgeon: KraThornton ParkD;  Location: [facility] ORS;  Service: Orthopedics;  Laterality: Left;  . ROTATOR CUFF REPAIR  1999   bilateral    FAMILY HISTORY :   Family History  Problem Relation Age of Onset  . Cancer - Other Son        appendix cancer  . Prostate cancer Brother   . CAD Brother   . Kidney cancer Father   . Breast cancer Sister   . Stroke Sister     SOCIAL HISTORY:   Social History   Tobacco Use  . Smoking status: Never Smoker  . Smokeless tobacco: Never Used  Substance Use Topics  . Alcohol use: Yes    Alcohol/week: 7.0 standard drinks    Types: 7 Glasses of wine per week  . Drug use: No    ALLERGIES:  is allergic to codeine and penicillins.  MEDICATIONS:  Current Outpatient Medications  Medication  Sig Dispense Refill  . amLODipine (NORVASC) 5 MG tablet Take 1 tablet (5 mg total) by mouth daily. 30 tablet 0  . aspirin EC 81 MG tablet Take 1 tablet (81 mg total) by mouth daily. 30 tablet 1  . ATORVASTATIN CALCIUM PO Take 5 mg by mouth daily.     . calcium gluconate 500 MG tablet Take 1 tablet by mouth 2 (two) times daily.    . Cholecalciferol (VITAMIN D) 2000 units tablet Take 1 tablet by  mouth daily.    Marland Kitchen dexamethasone (DECADRON) 4 MG tablet TAKE ONE TABLET THREE TIMES A WEEK 12 tablet 4  . feeding supplement, ENSURE ENLIVE, (ENSURE ENLIVE) LIQD Take 237 mLs by mouth 2 (two) times daily between meals. 237 mL 12  . levothyroxine (SYNTHROID, LEVOTHROID) 25 MCG tablet Take 1 tablet (25 mcg total) by mouth daily before breakfast. 30 tablet 1  . metoprolol tartrate (LOPRESSOR) 25 MG tablet Take 1 tablet (25 mg total) by mouth 2 (two) times daily. 60 tablet 1  . PARoxetine (PAXIL) 40 MG tablet Take 40 mg by mouth at bedtime.    . polyethylene glycol (MIRALAX / GLYCOLAX) packet Take 17 g by mouth daily. 14 each 0  . senna-docusate (SENOKOT-S) 8.6-50 MG tablet Take 2 tablets by mouth at bedtime as needed for mild constipation.     . traMADol (ULTRAM) 50 MG tablet Take 1 tablet (50 mg total) by mouth every 6 (six) hours as needed for moderate pain or severe pain. 30 tablet 0  . traZODone (DESYREL) 50 MG tablet Take 25 mg by mouth at bedtime.     . vitamin B-12 (CYANOCOBALAMIN) 1000 MCG tablet Take 1,000 mcg by mouth daily.    Marland Kitchen acetaminophen (TYLENOL) 325 MG tablet Take 2 tablets (650 mg total) by mouth every 6 (six) hours as needed for mild pain, moderate pain or fever. (Patient not taking: Reported on 08/20/2017) 30 tablet 2  . lenalidomide (REVLIMID) 5 MG capsule Take 1 capsule (5 mg total) by mouth daily. Take for 14 days on, then 7 days off. (Patient not taking: Reported on 09/10/2017) 14 capsule 0   No current facility-administered medications for this visit.     PHYSICAL EXAMINATION: ECOG PERFORMANCE STATUS: 0 - Asymptomatic  BP 132/85 (BP Location: Left Arm, Patient Position: Sitting)   Pulse 77   Resp 16   Wt 113 lb 9.6 oz (51.5 kg)   BMI 18.34 kg/m   Filed Weights   09/10/17 1439  Weight: 113 lb 9.6 oz (51.5 kg)    GENERAL: Well-nourished well-developed; Alert, no distress and comfortable. Accompanied by family.  EYES: no pallor or icterus OROPHARYNX: no thrush or  ulceration; NECK: supple; no lymph nodes felt. LYMPH:  no palpable lymphadenopathy in the axillary or inguinal regions LUNGS: Decreased breath sounds auscultation bilaterally. No wheeze or crackles HEART/CVS: regular rate & rhythm and no murmurs; No lower extremity edema ABDOMEN:abdomen soft, non-tender and normal bowel sounds. No hepatomegaly or splenomegaly.  Musculoskeletal:no cyanosis of digits and no clubbing  PSYCH: alert & oriented x 3 with fluent speech NEURO: no focal motor/sensory deficits SKIN:  no rashes or significant lesions    LABORATORY DATA:  I have reviewed the data as listed    Component Value Date/Time   NA 140 09/10/2017 1408   K 3.8 09/10/2017 1408   CL 101 09/10/2017 1408   CO2 26 09/10/2017 1408   GLUCOSE 104 (H) 09/10/2017 1408   BUN 21 09/10/2017 1408   CREATININE 1.18 (H)  09/10/2017 1408   CALCIUM 8.9 09/10/2017 1408   CALCIUM 10.1 05/26/2017 0419   PROT 7.4 09/10/2017 1408   ALBUMIN 3.2 (L) 09/10/2017 1408   AST 20 09/10/2017 1408   ALT 28 09/10/2017 1408   ALKPHOS 63 09/10/2017 1408   BILITOT 1.2 09/10/2017 1408   GFRNONAA 41 (L) 09/10/2017 1408   GFRAA 47 (L) 09/10/2017 1408    No results found for: SPEP, UPEP  Lab Results  Component Value Date   WBC 7.7 09/10/2017   NEUTROABS 6.3 09/10/2017   HGB 10.8 (L) 09/10/2017   HCT 34.4 (L) 09/10/2017   MCV 92.6 09/10/2017   PLT 430 09/10/2017      Chemistry      Component Value Date/Time   NA 140 09/10/2017 1408   K 3.8 09/10/2017 1408   CL 101 09/10/2017 1408   CO2 26 09/10/2017 1408   BUN 21 09/10/2017 1408   CREATININE 1.18 (H) 09/10/2017 1408      Component Value Date/Time   CALCIUM 8.9 09/10/2017 1408   CALCIUM 10.1 05/26/2017 0419   ALKPHOS 63 09/10/2017 1408   AST 20 09/10/2017 1408   ALT 28 09/10/2017 1408   BILITOT 1.2 09/10/2017 1408       RADIOGRAPHIC STUDIES: I have personally reviewed the radiological images as listed and agreed with the findings in the  report. No results found.   ASSESSMENT & PLAN:  Multiple myeloma in relapse ([place]) #Multiple myeloma -IgA kappa; symptomatic.  Stage II.  Currently on dexamethasone 4 mg 3 times a week;Revlimid 5 mg 2 weeks on 1 week off.  Improving-M-M protein decreasing; no need for any blood transfusions.  #Clinically improving; no major side effects noted except for mild diarrhea.  Continue current therapy/current dose.awaiting refills.   # Anemia hemoglobin 8.8; second multiple myeloma; hemoglobin today improved to 9.9.    # diarrhea- G-1 sec to Revlimid- continue Imodium as needed.  Stable.  #Hypercalcemia-;STABLE.  Ca- 8.9; continue zometa [reluctance].    # Malnutrition albumin 2.5- improving.  # Dementia- STABLE.   # follow up in 3 weeks/labs- cbc/bmp.  Myeloma panel from today pending.   Orders Placed This Encounter  Procedures  . CBC with Differential/Platelet    Standing Status:   Future    Standing Expiration Date:   09/11/2018  . Comprehensive metabolic panel    Standing Status:   Future    Standing Expiration Date:   09/11/2018   All questions were answered. The patient knows to call the clinic with any problems, questions or concerns.      Cammie Sickle, MD 09/10/2017 5:03 PM

## 2017-09-10 NOTE — Assessment & Plan Note (Addendum)
#  Multiple myeloma -IgA kappa; symptomatic.  Stage II.  Currently on dexamethasone 4 mg 3 times a week;Revlimid 5 mg 2 weeks on 1 week off.  Improving-M-M protein decreasing; no need for any blood transfusions.  #Clinically improving; no major side effects noted except for mild diarrhea.  Continue current therapy/current dose.awaiting refills.   # Anemia hemoglobin 8.8; second multiple myeloma; hemoglobin today improved to 9.9.    # diarrhea- G-1 sec to Revlimid- continue Imodium as needed.  Stable.  #Hypercalcemia-;STABLE.  Ca- 8.9; continue zometa [reluctance].    # Malnutrition albumin 2.5- improving.  # Dementia- STABLE.   # follow up in 3 weeks/labs- cbc/bmp.  Myeloma panel from today pending.

## 2017-09-11 ENCOUNTER — Other Ambulatory Visit: Payer: Self-pay | Admitting: *Deleted

## 2017-09-11 DIAGNOSIS — C9002 Multiple myeloma in relapse: Secondary | ICD-10-CM

## 2017-09-11 LAB — KAPPA/LAMBDA LIGHT CHAINS
KAPPA FREE LGHT CHN: 331.1 mg/L — AB (ref 3.3–19.4)
KAPPA, LAMDA LIGHT CHAIN RATIO: 127.35 — AB (ref 0.26–1.65)
LAMDA FREE LIGHT CHAINS: 2.6 mg/L — AB (ref 5.7–26.3)

## 2017-09-11 MED ORDER — LENALIDOMIDE 5 MG PO CAPS: 5.0000 mg | ORAL_CAPSULE | Freq: Every day | ORAL | 0 refills | Status: DC

## 2017-09-12 LAB — MULTIPLE MYELOMA PANEL, SERUM
Albumin SerPl Elph-Mcnc: 3.3 g/dL (ref 2.9–4.4)
Albumin/Glob SerPl: 0.9 (ref 0.7–1.7)
Alpha 1: 0.2 g/dL (ref 0.0–0.4)
Alpha2 Glob SerPl Elph-Mcnc: 0.7 g/dL (ref 0.4–1.0)
B-Globulin SerPl Elph-Mcnc: 2.5 g/dL — ABNORMAL HIGH (ref 0.7–1.3)
Gamma Glob SerPl Elph-Mcnc: 0.5 g/dL (ref 0.4–1.8)
Globulin, Total: 3.8 g/dL (ref 2.2–3.9)
IGA: 2531 mg/dL — AB (ref 64–422)
IGM (IMMUNOGLOBULIN M), SRM: 6 mg/dL — AB (ref 26–217)
IgG (Immunoglobin G), Serum: 233 mg/dL — ABNORMAL LOW (ref 700–1600)
M Protein SerPl Elph-Mcnc: 1.6 g/dL — ABNORMAL HIGH
TOTAL PROTEIN ELP: 7.1 g/dL (ref 6.0–8.5)

## 2017-09-13 ENCOUNTER — Telehealth: Payer: Self-pay | Admitting: *Deleted

## 2017-09-13 NOTE — Telephone Encounter (Signed)
Daughter would like to continue giving patient tums as substitute for calcium as this is easier for the patient to chew. Dr. B made aware.

## 2017-09-13 NOTE — Telephone Encounter (Signed)
Daughter called RN back. She feels more comfortable holding the revlimid until tomorrow until symptoms resolve

## 2017-09-13 NOTE — Telephone Encounter (Signed)
Dr. Rogue Bussing spoke with daughter. Patient has not started back on revlimid. Daughter expecting shipment of revlimid tonight. Daughter will contact our office back tomorrow to determine if pt's symptoms of diarrhea has resolved. Daughter instructed to have patient continue to take imodium AD after each loose stool to see if symptoms resolve. Daughter instructed to have patient increase po fluid intake.

## 2017-09-13 NOTE — Telephone Encounter (Signed)
Daughter called and reports that patient has had diarrhea for 2 days and the Loperamide is not helping. Also asking about the instructions to give her calcium, states she is already taking TUMS for calcium, is she to continue that? Please advise

## 2017-09-14 ENCOUNTER — Telehealth: Payer: Self-pay | Admitting: *Deleted

## 2017-09-14 NOTE — Telephone Encounter (Signed)
Call returned to daughter and she was informed that patient can restart her Revlimid.

## 2017-09-14 NOTE — Telephone Encounter (Signed)
Daughter called to report that patient diarrhea has now subsided and is asking if she  should start her Revlimid now as she is a week over due starting now. Please advise

## 2017-09-14 NOTE — Telephone Encounter (Signed)
Yes, she can start back on revlimid

## 2017-09-26 ENCOUNTER — Other Ambulatory Visit: Payer: Self-pay | Admitting: *Deleted

## 2017-09-26 DIAGNOSIS — C9002 Multiple myeloma in relapse: Secondary | ICD-10-CM

## 2017-09-27 MED ORDER — LENALIDOMIDE 5 MG PO CAPS: 5.0000 mg | ORAL_CAPSULE | Freq: Every day | ORAL | 0 refills | Status: DC

## 2017-10-01 ENCOUNTER — Other Ambulatory Visit: Payer: PPO

## 2017-10-01 ENCOUNTER — Ambulatory Visit: Payer: PPO | Admitting: Internal Medicine

## 2017-10-03 ENCOUNTER — Inpatient Hospital Stay: Payer: PPO | Attending: Internal Medicine

## 2017-10-03 ENCOUNTER — Inpatient Hospital Stay: Payer: PPO | Admitting: Internal Medicine

## 2017-10-03 ENCOUNTER — Other Ambulatory Visit: Payer: Self-pay

## 2017-10-03 VITALS — BP 99/62 | HR 55 | Temp 97.6°F | Resp 18 | Ht 66.0 in | Wt 116.0 lb

## 2017-10-03 DIAGNOSIS — I959 Hypotension, unspecified: Secondary | ICD-10-CM | POA: Insufficient documentation

## 2017-10-03 DIAGNOSIS — K521 Toxic gastroenteritis and colitis: Secondary | ICD-10-CM | POA: Diagnosis not present

## 2017-10-03 DIAGNOSIS — F0390 Unspecified dementia without behavioral disturbance: Secondary | ICD-10-CM

## 2017-10-03 DIAGNOSIS — R1310 Dysphagia, unspecified: Secondary | ICD-10-CM

## 2017-10-03 DIAGNOSIS — B359 Dermatophytosis, unspecified: Secondary | ICD-10-CM | POA: Insufficient documentation

## 2017-10-03 DIAGNOSIS — D630 Anemia in neoplastic disease: Secondary | ICD-10-CM | POA: Insufficient documentation

## 2017-10-03 DIAGNOSIS — C9002 Multiple myeloma in relapse: Secondary | ICD-10-CM | POA: Insufficient documentation

## 2017-10-03 DIAGNOSIS — Z79899 Other long term (current) drug therapy: Secondary | ICD-10-CM | POA: Insufficient documentation

## 2017-10-03 DIAGNOSIS — R944 Abnormal results of kidney function studies: Secondary | ICD-10-CM | POA: Insufficient documentation

## 2017-10-03 DIAGNOSIS — Z7952 Long term (current) use of systemic steroids: Secondary | ICD-10-CM

## 2017-10-03 DIAGNOSIS — T451X5A Adverse effect of antineoplastic and immunosuppressive drugs, initial encounter: Secondary | ICD-10-CM | POA: Insufficient documentation

## 2017-10-03 LAB — CBC WITH DIFFERENTIAL/PLATELET
BASOS PCT: 1 %
Basophils Absolute: 0.1 10*3/uL (ref 0–0.1)
EOS ABS: 0.1 10*3/uL (ref 0–0.7)
EOS PCT: 1 %
HEMATOCRIT: 34.4 % — AB (ref 35.0–47.0)
Hemoglobin: 11.1 g/dL — ABNORMAL LOW (ref 12.0–16.0)
Lymphocytes Relative: 10 %
Lymphs Abs: 0.8 10*3/uL — ABNORMAL LOW (ref 1.0–3.6)
MCH: 29.2 pg (ref 26.0–34.0)
MCHC: 32.2 g/dL (ref 32.0–36.0)
MCV: 90.5 fL (ref 80.0–100.0)
MONO ABS: 0.6 10*3/uL (ref 0.2–0.9)
MONOS PCT: 8 %
NEUTROS ABS: 6.6 10*3/uL — AB (ref 1.4–6.5)
Neutrophils Relative %: 80 %
PLATELETS: 322 10*3/uL (ref 150–440)
RBC: 3.8 MIL/uL (ref 3.80–5.20)
RDW: 19.2 % — AB (ref 11.5–14.5)
WBC: 8.2 10*3/uL (ref 3.6–11.0)

## 2017-10-03 LAB — COMPREHENSIVE METABOLIC PANEL
ALBUMIN: 3.2 g/dL — AB (ref 3.5–5.0)
ALK PHOS: 49 U/L (ref 38–126)
ALT: 28 U/L (ref 0–44)
ANION GAP: 10 (ref 5–15)
AST: 21 U/L (ref 15–41)
BILIRUBIN TOTAL: 0.9 mg/dL (ref 0.3–1.2)
BUN: 21 mg/dL (ref 8–23)
CALCIUM: 8.6 mg/dL — AB (ref 8.9–10.3)
CO2: 25 mmol/L (ref 22–32)
CREATININE: 1.23 mg/dL — AB (ref 0.44–1.00)
Chloride: 105 mmol/L (ref 98–111)
GFR calc Af Amer: 45 mL/min — ABNORMAL LOW (ref >60)
GFR calc non Af Amer: 39 mL/min — ABNORMAL LOW (ref >60)
GLUCOSE: 109 mg/dL — AB (ref 70–99)
Potassium: 3.7 mmol/L (ref 3.5–5.1)
SODIUM: 140 mmol/L (ref 135–145)
TOTAL PROTEIN: 7.2 g/dL (ref 6.5–8.1)

## 2017-10-03 NOTE — Patient Instructions (Signed)
#   HOLD METOPROLOL for now; continue amlodipine; check blood pressures at home; call us/bring the log

## 2017-10-03 NOTE — Assessment & Plan Note (Addendum)
#  Multiple myeloma -IgA kappa; symptomatic.  Stage II.  Currently on dexamethasone 4 mg 3 times a week;Revlimid 5 mg 2 weeks on 1 week off.  M protein decreasing;AUG 2019 M=1.6;  no need for any blood transfusions. STABLE  #Continue Revlimid dexamethasone.  # Creatinine elevated at 1.23; [BL ~1.0]; ? Sec to hypotension/Poor po intake. See below;   # Hypotension- on metoprolo/amlodipine. HOLD off metoprolol; check cuff at home; bring [place] log.   # Anemia hemoglobin- sec to MM-11.1;improved  # Dysphagia- new; esophagogram if worse.   # Tinea of left underam-recommend miconazole ointment.  # diarrhea- G-1 sec to Revlimid- continue Imodium as needed.  Stable.  #Hypercalcemia/MM- improved on Zometa '3mg'$  q 4 weeks.   # Dementia- STABLE.   # follow up in 3 weeks/labs- cbc/bmp; Myeloma panel; k/l light chain.

## 2017-10-03 NOTE — Progress Notes (Signed)
[place] OFFICE PROGRESS NOTE  Patient Care Team: [name], MD as PCP - General (Family Medicine)  Cancer Staging No matching staging information was found for the patient.   Oncology History   # MAY 2019- MULTIPLE MYELOMA [No BMBx]; IgA-K [baseline- 3.4 gm/dl; ]  # Hypercalcemia- s/p Zometa 68m IVP [May 2019]  # Mod dementia; s/p Left hip Fx [may 2019]  -------------------------------------------------------------------------   DIAGNOSIS: [MAY 2019]- ACTIVE MULTIPLE MYELOMA  STAGE:  II  ; GOALS: PALLIATIVE  CURRENT/MOST RECENT THERAPY: Dex 434m3/w; Rev 39m82mw-On/1w-OFF      [place]    INTERVAL HISTORY:  [facility] 40o.  female pleasant patient above history of dementia; also history of multiple myeloma currently on Revlimid dexamethasone is here for follow-up.  Patient has gained some weight.  However she complains of mild itching and rash in her left underarm.  No diarrhea.   No dizzy spells. No nausea vomiting.  No diarrhea.  No bone pain.  Review of Systems  Constitutional: Negative for chills, diaphoresis, fever, malaise/fatigue and weight loss.  HENT: Negative for nosebleeds and sore throat.   Eyes: Negative for double vision.  Respiratory: Negative for cough, hemoptysis, sputum production, shortness of breath and wheezing.   Cardiovascular: Negative for chest pain, palpitations, orthopnea and leg swelling.  Gastrointestinal: Negative for abdominal pain, blood in stool, constipation, diarrhea, heartburn, melena, nausea and vomiting.  Genitourinary: Negative for dysuria, frequency and urgency.  Musculoskeletal: Negative for back pain and joint pain.  Skin: Positive for itching and rash.  Neurological: Negative for dizziness, tingling, focal weakness, weakness and headaches.  Endo/Heme/Allergies: Does not bruise/bleed easily.  Psychiatric/Behavioral: Positive for memory loss. Negative for  depression. The patient is not nervous/anxious and does not have insomnia.       PAST MEDICAL HISTORY :  Past Medical History:  Diagnosis Date  . Arthritis    hands  . Cataract   . Claustrophobia   . Depression    with anxiety/h/o agoraphobia  . Glaucoma   . [place]  [place]  . HOH (hard of hearing)   . Hyperlipidemia   . Hypertension   . [place] ([place] . PONV (postoperative nausea and vomiting)     PAST SURGICAL HISTORY :   Past Surgical History:  Procedure Laterality Date  . CATARACT EXTRACTION W/PHACO Right 03/15/2015   Procedure: CATARACT EXTRACTION PHACO AND INTRAOCULAR LENS PLACEMENT (IOC);  Surgeon: AniRonnell FreshwaterD;  [facility];  Laterality: Right;  . INTRAMEDULLARY (IM) NAIL INTERTROCHANTERIC Left 05/25/2017   Procedure: INTRAMEDULLARY (IM) NAIL INTERTROCHANTRIC;  Surgeon: KraThornton ParkD;  Location: [facility] ORS;  Service: Orthopedics;  Laterality: Left;  . ROTATOR CUFF REPAIR  1999   bilateral    FAMILY HISTORY :   Family History  Problem Relation Age of Onset  . Cancer - Other Son        appendix cancer  . Prostate cancer Brother   . CAD Brother   . Kidney cancer Father   . Breast cancer Sister   . Stroke Sister     SOCIAL HISTORY:   Social History   Tobacco Use  . Smoking status: Never Smoker  . Smokeless tobacco: Never Used  Substance Use Topics  . Alcohol use: Yes    Alcohol/week: 7.0 standard drinks    Types: 7 Glasses of wine per week  . Drug use: No  ALLERGIES:  is allergic to codeine and penicillins.  MEDICATIONS:  Current Outpatient Medications  Medication Sig Dispense Refill  . aspirin EC 81 MG tablet Take 1 tablet (81 mg total) by mouth daily. 30 tablet 1  . ATORVASTATIN CALCIUM PO Take 5 mg by mouth daily.     . calcium gluconate 500 MG tablet Take 1 tablet by mouth 2 (two) times daily.    . Cholecalciferol (VITAMIN D) 2000 units tablet Take 1  tablet by mouth daily.    Marland Kitchen dexamethasone (DECADRON) 4 MG tablet TAKE ONE TABLET THREE TIMES A WEEK 12 tablet 4  . feeding supplement, ENSURE ENLIVE, (ENSURE ENLIVE) LIQD Take 237 mLs by mouth 2 (two) times daily between meals. 237 mL 12  . levothyroxine (SYNTHROID, LEVOTHROID) 25 MCG tablet Take 1 tablet (25 mcg total) by mouth daily before breakfast. 30 tablet 1  . metoprolol tartrate (LOPRESSOR) 25 MG tablet Take 1 tablet (25 mg total) by mouth 2 (two) times daily. 60 tablet 1  . PARoxetine (PAXIL) 40 MG tablet Take 40 mg by mouth at bedtime.    . senna-docusate (SENOKOT-S) 8.6-50 MG tablet Take 2 tablets by mouth at bedtime as needed for mild constipation.     . traZODone (DESYREL) 50 MG tablet Take 25 mg by mouth at bedtime.     . vitamin B-12 (CYANOCOBALAMIN) 1000 MCG tablet Take 1,000 mcg by mouth daily.    Marland Kitchen acetaminophen (TYLENOL) 325 MG tablet Take 2 tablets (650 mg total) by mouth every 6 (six) hours as needed for mild pain, moderate pain or fever. (Patient not taking: Reported on 08/20/2017) 30 tablet 2  . amLODipine (NORVASC) 5 MG tablet Take 1 tablet (5 mg total) by mouth daily. 30 tablet 0  . lenalidomide (REVLIMID) 5 MG capsule Take 1 capsule (5 mg total) by mouth daily. Take for 14 days on, then 7 days off. (Patient not taking: Reported on 10/03/2017) 14 capsule 0  . polyethylene glycol (MIRALAX / GLYCOLAX) packet Take 17 g by mouth daily. (Patient not taking: Reported on 10/03/2017) 14 each 0  . traMADol (ULTRAM) 50 MG tablet Take 1 tablet (50 mg total) by mouth every 6 (six) hours as needed for moderate pain or severe pain. (Patient not taking: Reported on 10/03/2017) 30 tablet 0   No current facility-administered medications for this visit.     PHYSICAL EXAMINATION: ECOG PERFORMANCE STATUS: 0 - Asymptomatic  BP 99/62 (Patient Position: Sitting) Comment: recheck  Pulse (!) 55   Temp 97.6 F (36.4 C) (Tympanic)   Resp 18   Ht '5\' 6"'  (1.676 m)   Wt 116 lb (52.6 kg)   BMI 18.72  kg/m   Filed Weights   10/03/17 1326  Weight: 116 lb (52.6 kg)    Physical Exam  Constitutional: She is oriented to person, place, and time.  Frail-appearing Caucasian female patient.  Resting in a wheelchair.  She is accompanied by daughter.  HENT:  Head: Normocephalic and atraumatic.  Mouth/Throat: Oropharynx is clear and moist. No oropharyngeal exudate.  Eyes: Pupils are equal, round, and reactive to light.  Neck: Normal range of motion. Neck supple.  Cardiovascular: Normal rate and regular rhythm.  Pulmonary/Chest: No respiratory distress. She has no wheezes.  Abdominal: Soft. Bowel sounds are normal. She exhibits no distension and no mass. There is no tenderness. There is no rebound and no guarding.  Musculoskeletal: Normal range of motion. She exhibits no edema or tenderness.  Neurological: She is alert and oriented to person, place,  and time.  Skin: Skin is warm.  Erythematous rash with central clearing noted in the left underarm.  Psychiatric: Affect normal.       LABORATORY DATA:  I have reviewed the data as listed    Component Value Date/Time   NA 140 10/03/2017 1303   K 3.7 10/03/2017 1303   CL 105 10/03/2017 1303   CO2 25 10/03/2017 1303   GLUCOSE 109 (H) 10/03/2017 1303   BUN 21 10/03/2017 1303   CREATININE 1.23 (H) 10/03/2017 1303   CALCIUM 8.6 (L) 10/03/2017 1303   CALCIUM 10.1 05/26/2017 0419   PROT 7.2 10/03/2017 1303   ALBUMIN 3.2 (L) 10/03/2017 1303   AST 21 10/03/2017 1303   ALT 28 10/03/2017 1303   ALKPHOS 49 10/03/2017 1303   BILITOT 0.9 10/03/2017 1303   GFRNONAA 39 (L) 10/03/2017 1303   GFRAA 45 (L) 10/03/2017 1303    No results found for: SPEP, UPEP  Lab Results  Component Value Date   WBC 8.2 10/03/2017   NEUTROABS 6.6 (H) 10/03/2017   HGB 11.1 (L) 10/03/2017   HCT 34.4 (L) 10/03/2017   MCV 90.5 10/03/2017   PLT 322 10/03/2017      Chemistry      Component Value Date/Time   NA 140 10/03/2017 1303   K 3.7 10/03/2017 1303    CL 105 10/03/2017 1303   CO2 25 10/03/2017 1303   BUN 21 10/03/2017 1303   CREATININE 1.23 (H) 10/03/2017 1303      Component Value Date/Time   CALCIUM 8.6 (L) 10/03/2017 1303   CALCIUM 10.1 05/26/2017 0419   ALKPHOS 49 10/03/2017 1303   AST 21 10/03/2017 1303   ALT 28 10/03/2017 1303   BILITOT 0.9 10/03/2017 1303       RADIOGRAPHIC STUDIES: I have personally reviewed the radiological images as listed and agreed with the findings in the report. No results found.   ASSESSMENT & PLAN:  Multiple myeloma in relapse ([place]) # Multiple myeloma -IgA kappa; symptomatic.  Stage II.  Currently on dexamethasone 4 mg 3 times a week;Revlimid 5 mg 2 weeks on 1 week off.  M protein decreasing;AUG 2019 M=1.6;  no need for any blood transfusions. STABLE  #Continue Revlimid dexamethasone.  # Creatinine elevated at 1.23; [BL ~1.0]; ? Sec to hypotension/Poor po intake. See below;   # Hypotension- on metoprolo/amlodipine. HOLD off metoprolol; check cuff at home; bring [place] log.   # Anemia hemoglobin- sec to MM-11.1;improved  # Dysphagia- new; esophagogram if worse.   # Tinea of left underam-recommend miconazole ointment.  # diarrhea- G-1 sec to Revlimid- continue Imodium as needed.  Stable.  #Hypercalcemia/MM- improved on Zometa 45m q 4 weeks.   # Dementia- STABLE.   # follow up in 3 weeks/labs- cbc/bmp; Myeloma panel; k/l light chain.    No orders of the defined types were placed in this encounter.  All questions were answered. The patient knows to call the clinic with any problems, questions or concerns.      GCammie Sickle MD 10/03/2017 5:12 PM

## 2017-10-15 DIAGNOSIS — I10 Essential (primary) hypertension: Secondary | ICD-10-CM | POA: Diagnosis not present

## 2017-10-15 DIAGNOSIS — Z0000 Encounter for general adult medical examination without abnormal findings: Secondary | ICD-10-CM | POA: Diagnosis not present

## 2017-10-15 DIAGNOSIS — Z66 Do not resuscitate: Secondary | ICD-10-CM | POA: Insufficient documentation

## 2017-10-15 DIAGNOSIS — D539 Nutritional anemia, unspecified: Secondary | ICD-10-CM | POA: Diagnosis not present

## 2017-10-15 DIAGNOSIS — C9000 Multiple myeloma not having achieved remission: Secondary | ICD-10-CM | POA: Diagnosis not present

## 2017-10-15 DIAGNOSIS — F325 Major depressive disorder, single episode, in full remission: Secondary | ICD-10-CM | POA: Diagnosis not present

## 2017-10-15 DIAGNOSIS — N183 Chronic kidney disease, stage 3 (moderate): Secondary | ICD-10-CM | POA: Diagnosis not present

## 2017-10-19 ENCOUNTER — Other Ambulatory Visit: Payer: Self-pay | Admitting: *Deleted

## 2017-10-19 DIAGNOSIS — C9002 Multiple myeloma in relapse: Secondary | ICD-10-CM

## 2017-10-22 MED ORDER — LENALIDOMIDE 5 MG PO CAPS: 5.0000 mg | ORAL_CAPSULE | Freq: Every day | ORAL | 0 refills | Status: DC

## 2017-10-24 ENCOUNTER — Ambulatory Visit: Payer: PPO | Admitting: Internal Medicine

## 2017-10-24 ENCOUNTER — Other Ambulatory Visit: Payer: PPO

## 2017-10-25 ENCOUNTER — Other Ambulatory Visit: Payer: Self-pay | Admitting: Internal Medicine

## 2017-10-25 DIAGNOSIS — C9002 Multiple myeloma in relapse: Secondary | ICD-10-CM

## 2017-10-26 ENCOUNTER — Other Ambulatory Visit: Payer: Self-pay

## 2017-10-26 ENCOUNTER — Inpatient Hospital Stay (HOSPITAL_BASED_OUTPATIENT_CLINIC_OR_DEPARTMENT_OTHER): Payer: PPO | Admitting: Internal Medicine

## 2017-10-26 ENCOUNTER — Inpatient Hospital Stay: Payer: PPO | Attending: Internal Medicine

## 2017-10-26 VITALS — BP 128/81 | HR 103 | Temp 97.9°F | Resp 20 | Ht 66.0 in | Wt 116.0 lb

## 2017-10-26 DIAGNOSIS — R197 Diarrhea, unspecified: Secondary | ICD-10-CM | POA: Diagnosis not present

## 2017-10-26 DIAGNOSIS — F0390 Unspecified dementia without behavioral disturbance: Secondary | ICD-10-CM | POA: Diagnosis not present

## 2017-10-26 DIAGNOSIS — C9002 Multiple myeloma in relapse: Secondary | ICD-10-CM | POA: Diagnosis not present

## 2017-10-26 DIAGNOSIS — Z79899 Other long term (current) drug therapy: Secondary | ICD-10-CM | POA: Insufficient documentation

## 2017-10-26 DIAGNOSIS — D649 Anemia, unspecified: Secondary | ICD-10-CM | POA: Insufficient documentation

## 2017-10-26 DIAGNOSIS — I129 Hypertensive chronic kidney disease with stage 1 through stage 4 chronic kidney disease, or unspecified chronic kidney disease: Secondary | ICD-10-CM | POA: Insufficient documentation

## 2017-10-26 DIAGNOSIS — N183 Chronic kidney disease, stage 3 (moderate): Secondary | ICD-10-CM | POA: Insufficient documentation

## 2017-10-26 LAB — COMPREHENSIVE METABOLIC PANEL
ALBUMIN: 3.4 g/dL — AB (ref 3.5–5.0)
ALK PHOS: 58 U/L (ref 38–126)
ALT: 28 U/L (ref 0–44)
ANION GAP: 11 (ref 5–15)
AST: 25 U/L (ref 15–41)
BILIRUBIN TOTAL: 1 mg/dL (ref 0.3–1.2)
BUN: 25 mg/dL — AB (ref 8–23)
CALCIUM: 8.9 mg/dL (ref 8.9–10.3)
CO2: 24 mmol/L (ref 22–32)
CREATININE: 1.31 mg/dL — AB (ref 0.44–1.00)
Chloride: 104 mmol/L (ref 98–111)
GFR calc Af Amer: 41 mL/min — ABNORMAL LOW (ref >60)
GFR calc non Af Amer: 36 mL/min — ABNORMAL LOW (ref >60)
GLUCOSE: 129 mg/dL — AB (ref 70–99)
Potassium: 4.1 mmol/L (ref 3.5–5.1)
SODIUM: 139 mmol/L (ref 135–145)
TOTAL PROTEIN: 7.4 g/dL (ref 6.5–8.1)

## 2017-10-26 LAB — CBC WITH DIFFERENTIAL/PLATELET
BASOS ABS: 0.1 10*3/uL (ref 0–0.1)
Basophils Relative: 1 %
Eosinophils Absolute: 0.1 10*3/uL (ref 0–0.7)
Eosinophils Relative: 1 %
HCT: 36.4 % (ref 35.0–47.0)
Hemoglobin: 11.9 g/dL — ABNORMAL LOW (ref 12.0–16.0)
LYMPHS PCT: 11 %
Lymphs Abs: 0.8 10*3/uL — ABNORMAL LOW (ref 1.0–3.6)
MCH: 29.5 pg (ref 26.0–34.0)
MCHC: 32.6 g/dL (ref 32.0–36.0)
MCV: 90.6 fL (ref 80.0–100.0)
Monocytes Absolute: 0.7 10*3/uL (ref 0.2–0.9)
Monocytes Relative: 9 %
NEUTROS ABS: 5.9 10*3/uL (ref 1.4–6.5)
NEUTROS PCT: 78 %
PLATELETS: 310 10*3/uL (ref 150–440)
RBC: 4.02 MIL/uL (ref 3.80–5.20)
RDW: 19.1 % — AB (ref 11.5–14.5)
WBC: 7.5 10*3/uL (ref 3.6–11.0)
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
## 2017-10-26 NOTE — Progress Notes (Signed)
[facility] OFF[facility] PROGRESS NOTE  Patient Care Team: [name], MD as PCP - General (Family Medicine)  Cancer Staging No matching staging information was found for the patient.   Oncology History   # MAY 2019- MULTIPLE MYELOMA [No BMBx]; IgA-K [baseline- 3.4 gm/dl; ]  # Hypercalcemia- s/p Zometa 74m IVP [May 2019]  # Mod dementia; s/p Left hip Fx [may 2019]  -------------------------------------------------------------------------   DIAGNOSIS: [MAY 2019]- ACTIVE MULTIPLE MYELOMA  STAGE:  II  ; GOALS: PALLIATIVE  CURRENT/MOST RECENT THERAPY: Dex 42m3/w; Rev 66m32mw-On/1w-OFF      [place]    INTERVAL HISTORY:  [facility] 82o.  female pleasant patient above history of dementia; also history of multiple myeloma currently on Revlimid dexamethasone is here for follow-up.  Patient appetite is improving.  Denies any significant nausea vomiting or diarrhea.  No dizzy spells.  No bone pain.   Review of Systems  Constitutional: Negative for chills, diaphoresis, fever, malaise/fatigue and weight loss.  HENT: Negative for nosebleeds and sore throat.   Eyes: Negative for double vision.  Respiratory: Negative for cough, hemoptysis, sputum production, shortness of breath and wheezing.   Cardiovascular: Negative for chest pain, palpitations, orthopnea and leg swelling.  Gastrointestinal: Negative for abdominal pain, blood in stool, constipation, diarrhea, heartburn, melena, nausea and vomiting.  Genitourinary: Negative for dysuria, frequency and urgency.  Musculoskeletal: Negative for back pain and joint pain.  Neurological: Negative for dizziness, tingling, focal weakness, weakness and headaches.  Endo/Heme/Allergies: Does not bruise/bleed easily.  Psychiatric/Behavioral: Positive for memory loss. Negative for depression. The patient is not nervous/anxious and does not have insomnia.       PAST MEDICAL HISTORY :   Past Medical History:  Diagnosis Date  . Arthritis    hands  . Cataract   . Claustrophobia   . Depression    with anxiety/h/o agoraphobia  . Glaucoma   . [place]  [place]  . HOH (hard of hearing)   . Hyperlipidemia   . Hypertension   . [place] ([place] . PONV (postoperative nausea and vomiting)     PAST SURGICAL HISTORY :   Past Surgical History:  Procedure Laterality Date  . CATARACT EXTRACTION W/PHACO Right 03/15/2015   Procedure: CATARACT EXTRACTION PHACO AND INTRAOCULAR LENS PLACEMENT (IOC);  Surgeon: AniRonnell FreshwaterD;  [place] [facility];  Laterality: Right;  . INTRAMEDULLARY (IM) NAIL INTERTROCHANTERIC Left 05/25/2017   Procedure: INTRAMEDULLARY (IM) NAIL INTERTROCHANTRIC;  Surgeon: KraThornton ParkD;  Location: [facility] ORS;  Service: Orthopedics;  Laterality: Left;  . ROTATOR CUFF REPAIR  1999   bilateral    FAMILY HISTORY :   Family History  Problem Relation Age of Onset  . Cancer - Other Son        appendix cancer  . Prostate cancer Brother   . CAD Brother   . Kidney cancer Father   . Breast cancer Sister   . Stroke Sister     SOCIAL HISTORY:   Social History   Tobacco Use  . Smoking status: Never Smoker  . Smokeless tobacco: Never Used  Substance Use Topics  . Alcohol use: Yes    Alcohol/week: 7.0 standard drinks    Types: 7 Glasses of wine per week  . Drug use: No    ALLERGIES:  is allergic to codeine and penicillins.  MEDICATIONS:  Current Outpatient Medications  Medication Sig Dispense Refill  .  acetaminophen (TYLENOL) 325 MG tablet Take 2 tablets (650 mg total) by mouth every 6 (six) hours as needed for mild pain, moderate pain or fever. (Patient not taking: Reported on 08/20/2017) 30 tablet 2  . amLODipine (NORVASC) 5 MG tablet Take 1 tablet (5 mg total) by mouth daily. 30 tablet 0  . aspirin EC 81 MG tablet Take 1 tablet (81 mg total) by mouth daily. 30 tablet 1  .  ATORVASTATIN CALCIUM PO Take 5 mg by mouth daily.     . calcium gluconate 500 MG tablet Take 1 tablet by mouth 2 (two) times daily.    . Cholecalciferol (VITAMIN D) 2000 units tablet Take 1 tablet by mouth daily.    Marland Kitchen dexamethasone (DECADRON) 4 MG tablet TAKE ONE TABLET THREE TIMES A WEEK 12 tablet 4  . feeding supplement, ENSURE ENLIVE, (ENSURE ENLIVE) LIQD Take 237 mLs by mouth 2 (two) times daily between meals. 237 mL 12  . lenalidomide (REVLIMID) 5 MG capsule Take 1 capsule (5 mg total) by mouth daily. Take for 14 days on, then 7 days off. 14 capsule 0  . levothyroxine (SYNTHROID, LEVOTHROID) 25 MCG tablet Take 1 tablet (25 mcg total) by mouth daily before breakfast. 30 tablet 1  . metoprolol tartrate (LOPRESSOR) 25 MG tablet Take 1 tablet (25 mg total) by mouth 2 (two) times daily. 60 tablet 1  . PARoxetine (PAXIL) 40 MG tablet Take 40 mg by mouth at bedtime.    . polyethylene glycol (MIRALAX / GLYCOLAX) packet Take 17 g by mouth daily. (Patient not taking: Reported on 10/03/2017) 14 each 0  . senna-docusate (SENOKOT-S) 8.6-50 MG tablet Take 2 tablets by mouth at bedtime as needed for mild constipation.     . traMADol (ULTRAM) 50 MG tablet Take 1 tablet (50 mg total) by mouth every 6 (six) hours as needed for moderate pain or severe pain. (Patient not taking: Reported on 10/03/2017) 30 tablet 0  . traZODone (DESYREL) 50 MG tablet Take 25 mg by mouth at bedtime.     . vitamin B-12 (CYANOCOBALAMIN) 1000 MCG tablet Take 1,000 mcg by mouth daily.     No current facility-administered medications for this visit.     PHYSICAL EXAMINATION: ECOG PERFORMANCE STATUS: 0 - Asymptomatic  BP 128/81 (BP Location: Left Arm, Patient Position: Sitting)   Pulse (!) 103   Temp 97.9 F (36.6 C) (Tympanic)   Resp 20   Ht 5' 6" (1.676 m)   Wt 116 lb (52.6 kg)   BMI 18.72 kg/m   Filed Weights   10/26/17 1310  Weight: 116 lb (52.6 kg)    Physical Exam  Constitutional:  Frail-appearing Caucasian female  patient.  Resting in a wheelchair.  She is accompanied by daughter.  HENT:  Head: Normocephalic and atraumatic.  Mouth/Throat: Oropharynx is clear and moist. No oropharyngeal exudate.  Eyes: Pupils are equal, round, and reactive to light.  Neck: Normal range of motion. Neck supple.  Cardiovascular: Normal rate and regular rhythm.  Pulmonary/Chest: No respiratory distress. She has no wheezes.  Abdominal: Soft. Bowel sounds are normal. She exhibits no distension and no mass. There is no tenderness. There is no rebound and no guarding.  Musculoskeletal: Normal range of motion. She exhibits no edema or tenderness.  Neurological: She is alert.  Oriented 2-3.  Skin: Skin is warm.  Psychiatric: Affect normal.       LABORATORY DATA:  I have reviewed the data as listed    Component Value Date/Time   NA  139 10/26/2017 1245   K 4.1 10/26/2017 1245   CL 104 10/26/2017 1245   CO2 24 10/26/2017 1245   GLUCOSE 129 (H) 10/26/2017 1245   BUN 25 (H) 10/26/2017 1245   CREATININE 1.31 (H) 10/26/2017 1245   CALCIUM 8.9 10/26/2017 1245   CALCIUM 10.1 05/26/2017 0419   PROT 7.4 10/26/2017 1245   ALBUMIN 3.4 (L) 10/26/2017 1245   AST 25 10/26/2017 1245   ALT 28 10/26/2017 1245   ALKPHOS 58 10/26/2017 1245   BILITOT 1.0 10/26/2017 1245   GFRNONAA 36 (L) 10/26/2017 1245   GFRAA 41 (L) 10/26/2017 1245    No results found for: SPEP, UPEP  Lab Results  Component Value Date   WBC 7.5 10/26/2017   NEUTROABS 5.9 10/26/2017   HGB 11.9 (L) 10/26/2017   HCT 36.4 10/26/2017   MCV 90.6 10/26/2017   PLT 310 10/26/2017      Chemistry      Component Value Date/Time   NA 139 10/26/2017 1245   K 4.1 10/26/2017 1245   CL 104 10/26/2017 1245   CO2 24 10/26/2017 1245   BUN 25 (H) 10/26/2017 1245   CREATININE 1.31 (H) 10/26/2017 1245      Component Value Date/Time   CALCIUM 8.9 10/26/2017 1245   CALCIUM 10.1 05/26/2017 0419   ALKPHOS 58 10/26/2017 1245   AST 25 10/26/2017 1245   ALT 28  10/26/2017 1245   BILITOT 1.0 10/26/2017 1245       RADIOGRAPHIC STUDIES: I have personally reviewed the radiological images as listed and agreed with the findings in the report. No results found.   ASSESSMENT & PLAN:  Multiple myeloma in relapse ([place]) # Multiple myeloma -IgA kappa; symptomatic.  Stage II.  Currently on dexamethasone 4 mg 3 times a week;Revlimid 5 mg 2 weeks on 1 week off.  M protein decreasing; AUG 2019 M=1.6;  Improving.   #Continue Revlimid dexamethasone. Clinically improved;   # Creatinine elevated at 1.3 ; [BL ~1.0]; increase PO intake.    # Anemia hemoglobin- sec to MM-11.9;improved  # Tinea of left underam-s/p miconazole improved  # diarrhea- G-1 sec to Revlimid- continue Imodium as needed.stable.   # Hypercalcemia/MM- improved on Zometa 86m q 4 weeks.   # Dementia- STABLE.   # follow up in 3 weeks/labs- cbc/bmp; Myeloma panel; k/l light chain; zometa   No orders of the defined types were placed in this encounter.  All questions were answered. The patient knows to call the clinic with any problems, questions or concerns.      GCammie Sickle MD 10/28/2017 4:43 PM

## 2017-10-26 NOTE — Assessment & Plan Note (Addendum)
#  Multiple myeloma -IgA kappa; symptomatic.  Stage II.  Currently on dexamethasone 4 mg 3 times a week;Revlimid 5 mg 2 weeks on 1 week off.  M protein decreasing; AUG 2019 M=1.6;  Improving.   #Continue Revlimid dexamethasone. Clinically improved;   # Creatinine elevated at 1.3 ; [BL ~1.0]; increase PO intake.    # Anemia hemoglobin- sec to MM-11.9;improved  # Tinea of left underam-s/p miconazole improved  # diarrhea- G-1 sec to Revlimid- continue Imodium as needed.stable.   # Hypercalcemia/MM- improved on Zometa '3mg'$  q 4 weeks.   # Dementia- STABLE.   # follow up in 3 weeks/labs- cbc/bmp; Myeloma panel; k/l light chain; zometa

## 2017-11-08 ENCOUNTER — Other Ambulatory Visit: Payer: Self-pay | Admitting: *Deleted

## 2017-11-08 DIAGNOSIS — C9002 Multiple myeloma in relapse: Secondary | ICD-10-CM

## 2017-11-08 MED ORDER — LENALIDOMIDE 5 MG PO CAPS: 5.0000 mg | ORAL_CAPSULE | Freq: Every day | ORAL | 0 refills | Status: DC

## 2017-11-16 ENCOUNTER — Other Ambulatory Visit: Payer: Self-pay | Admitting: Internal Medicine

## 2017-11-16 ENCOUNTER — Inpatient Hospital Stay (HOSPITAL_BASED_OUTPATIENT_CLINIC_OR_DEPARTMENT_OTHER): Payer: PPO

## 2017-11-16 ENCOUNTER — Other Ambulatory Visit: Payer: Self-pay

## 2017-11-16 ENCOUNTER — Inpatient Hospital Stay: Payer: PPO

## 2017-11-16 ENCOUNTER — Inpatient Hospital Stay (HOSPITAL_BASED_OUTPATIENT_CLINIC_OR_DEPARTMENT_OTHER): Payer: PPO | Admitting: Internal Medicine

## 2017-11-16 VITALS — BP 142/80 | HR 85 | Temp 98.2°F | Resp 18 | Wt 119.2 lb

## 2017-11-16 DIAGNOSIS — D649 Anemia, unspecified: Secondary | ICD-10-CM

## 2017-11-16 DIAGNOSIS — F0390 Unspecified dementia without behavioral disturbance: Secondary | ICD-10-CM

## 2017-11-16 DIAGNOSIS — N183 Chronic kidney disease, stage 3 (moderate): Secondary | ICD-10-CM

## 2017-11-16 DIAGNOSIS — C9002 Multiple myeloma in relapse: Secondary | ICD-10-CM

## 2017-11-16 DIAGNOSIS — Z79899 Other long term (current) drug therapy: Secondary | ICD-10-CM

## 2017-11-16 DIAGNOSIS — I129 Hypertensive chronic kidney disease with stage 1 through stage 4 chronic kidney disease, or unspecified chronic kidney disease: Secondary | ICD-10-CM | POA: Diagnosis not present

## 2017-11-16 DIAGNOSIS — R197 Diarrhea, unspecified: Secondary | ICD-10-CM | POA: Diagnosis not present

## 2017-11-16 LAB — COMPREHENSIVE METABOLIC PANEL
ALBUMIN: 3.3 g/dL — AB (ref 3.5–5.0)
ALT: 32 U/L (ref 0–44)
AST: 23 U/L (ref 15–41)
Alkaline Phosphatase: 54 U/L (ref 38–126)
Anion gap: 8 (ref 5–15)
BILIRUBIN TOTAL: 0.9 mg/dL (ref 0.3–1.2)
BUN: 28 mg/dL — AB (ref 8–23)
CHLORIDE: 106 mmol/L (ref 98–111)
CO2: 25 mmol/L (ref 22–32)
Calcium: 9.2 mg/dL (ref 8.9–10.3)
Creatinine, Ser: 1.36 mg/dL — ABNORMAL HIGH (ref 0.44–1.00)
GFR calc Af Amer: 40 mL/min — ABNORMAL LOW (ref >60)
GFR calc non Af Amer: 34 mL/min — ABNORMAL LOW (ref >60)
GLUCOSE: 110 mg/dL — AB (ref 70–99)
POTASSIUM: 3.9 mmol/L (ref 3.5–5.1)
Sodium: 139 mmol/L (ref 135–145)
TOTAL PROTEIN: 7.3 g/dL (ref 6.5–8.1)

## 2017-11-16 LAB — CBC WITH DIFFERENTIAL/PLATELET
ABS IMMATURE GRANULOCYTES: 0.03 10*3/uL (ref 0.00–0.07)
Basophils Absolute: 0.1 10*3/uL (ref 0.0–0.1)
Basophils Relative: 1 %
Eosinophils Absolute: 0 10*3/uL (ref 0.0–0.5)
Eosinophils Relative: 0 %
HEMATOCRIT: 37 % (ref 36.0–46.0)
HEMOGLOBIN: 11.3 g/dL — AB (ref 12.0–15.0)
Immature Granulocytes: 0 %
LYMPHS ABS: 0.6 10*3/uL — AB (ref 0.7–4.0)
LYMPHS PCT: 7 %
MCH: 28.6 pg (ref 26.0–34.0)
MCHC: 30.5 g/dL (ref 30.0–36.0)
MCV: 93.7 fL (ref 80.0–100.0)
MONO ABS: 0.6 10*3/uL (ref 0.1–1.0)
MONOS PCT: 7 %
NEUTROS ABS: 6.9 10*3/uL (ref 1.7–7.7)
Neutrophils Relative %: 85 %
Platelets: 305 10*3/uL (ref 150–400)
RBC: 3.95 MIL/uL (ref 3.87–5.11)
RDW: 16.6 % — ABNORMAL HIGH (ref 11.5–15.5)
WBC: 8.1 10*3/uL (ref 4.0–10.5)
nRBC: 0 % (ref 0.0–0.2)

## 2017-11-16 MED ORDER — ZOLEDRONIC ACID 4 MG/5ML IV CONC
3.0000 mg | Freq: Once | INTRAVENOUS | Status: AC
  Administered 2017-11-16: 3 mg via INTRAVENOUS
  Filled 2017-11-16: qty 3.75

## 2017-11-16 MED ORDER — SODIUM CHLORIDE 0.9 % IV SOLN
Freq: Once | INTRAVENOUS | Status: AC
  Administered 2017-11-16: 15:00:00 via INTRAVENOUS
  Filled 2017-11-16: qty 250

## 2017-11-16 NOTE — Progress Notes (Signed)
[facility] OFFICE PROGRESS NOTE  Patient Care Team: [name], MD as PCP - General (Family Medicine)  Cancer Staging No matching staging information was found for the patient.   Oncology History   # MAY 2019- MULTIPLE MYELOMA [No BMBx]; IgA-K [baseline- 3.4 gm/dl; ]  # Hypercalcemia- s/p Zometa 67m IVP [May 2019]  # Mod dementia; s/p Left hip Fx [may 2019]  -------------------------------------------------------------------------   DIAGNOSIS: [MAY 2019]- ACTIVE MULTIPLE MYELOMA  STAGE:  II  ; GOALS: PALLIATIVE  CURRENT/MOST RECENT THERAPY: Dex 480m3/w; Rev 7m72mw-On/1w-OFF      [place]    INTERVAL HISTORY:  [name] 52o.  female pleasant patient above history of dementia; also history of multiple myeloma currently on Revlimid dexamethasone is here for follow-up.  Denies any worsening bone pain.  Denies any chest pain or shortness of breath or cough but no diarrhea.  No tingling or numbness.   Review of Systems  Constitutional: Negative for chills, diaphoresis, fever, malaise/fatigue and weight loss.  HENT: Negative for nosebleeds and sore throat.   Eyes: Negative for double vision.  Respiratory: Negative for cough, hemoptysis, sputum production, shortness of breath and wheezing.   Cardiovascular: Negative for chest pain, palpitations, orthopnea and leg swelling.  Gastrointestinal: Negative for abdominal pain, blood in stool, constipation, diarrhea, heartburn, melena, nausea and vomiting.  Genitourinary: Negative for dysuria, frequency and urgency.  Musculoskeletal: Negative for back pain and joint pain.  Neurological: Negative for dizziness, tingling, focal weakness, weakness and headaches.  Endo/Heme/Allergies: Does not bruise/bleed easily.  Psychiatric/Behavioral: Positive for memory loss. Negative for depression. The patient is not nervous/anxious and does not have insomnia.       PAST MEDICAL  HISTORY :  Past Medical History:  Diagnosis Date  . Arthritis    hands  . Cataract   . Claustrophobia   . Depression    with anxiety/h/o agoraphobia  . Glaucoma   . [place] ([place]  [place]  . HOH (hard of hearing)   . Hyperlipidemia   . Hypertension   . Multiple myeloma ([facility] . PONV (postoperative nausea and vomiting)     PAST SURGICAL HISTORY :   Past Surgical History:  Procedure Laterality Date  . CATARACT EXTRACTION W/PHACO Right 03/15/2015   Procedure: CATARACT EXTRACTION PHACO AND INTRAOCULAR LENS PLACEMENT (IOC);  Surgeon: AniRonnell FreshwaterD;  [facility];  Laterality: Right;  . INTRAMEDULLARY (IM) NAIL INTERTROCHANTERIC Left 05/25/2017   Procedure: INTRAMEDULLARY (IM) NAIL INTERTROCHANTRIC;  Surgeon: KraThornton ParkD;  Location: [facility] ORS;  Service: Orthopedics;  Laterality: Left;  . ROTATOR CUFF REPAIR  1999   bilateral    FAMILY HISTORY :   Family History  Problem Relation Age of Onset  . Cancer - Other Son        appendix cancer  . Prostate cancer Brother   . CAD Brother   . Kidney cancer Father   . Breast cancer Sister   . Stroke Sister     SOCIAL HISTORY:   Social History   Tobacco Use  . Smoking status: Never Smoker  . Smokeless tobacco: Never Used  Substance Use Topics  . Alcohol use: Yes    Alcohol/week: 7.0 standard drinks    Types: 7 Glasses of wine per week  . Drug use: No    ALLERGIES:  is allergic to codeine and penicillins.  MEDICATIONS:  Current Outpatient Medications  Medication Sig Dispense Refill  . amLODipine (NORVASC) 5 MG tablet Take 1 tablet (5 mg total) by mouth daily. 30 tablet 0  . aspirin EC 81 MG tablet Take 1 tablet (81 mg total) by mouth daily. 30 tablet 1  . ATORVASTATIN CALCIUM PO Take 5 mg by mouth daily.     . calcium gluconate 500 MG tablet Take 1 tablet by mouth 2 (two) times daily.    . Cholecalciferol (VITAMIN D) 2000 units tablet Take 1 tablet by  mouth daily.    Marland Kitchen dexamethasone (DECADRON) 4 MG tablet TAKE ONE TABLET THREE TIMES A WEEK 12 tablet 4  . lenalidomide (REVLIMID) 5 MG capsule Take 1 capsule (5 mg total) by mouth daily. Take for 14 days on, then 7 days off. 14 capsule 0  . levothyroxine (SYNTHROID, LEVOTHROID) 25 MCG tablet Take 1 tablet (25 mcg total) by mouth daily before breakfast. 30 tablet 1  . PARoxetine (PAXIL) 40 MG tablet Take 40 mg by mouth at bedtime.    . polyethylene glycol (MIRALAX / GLYCOLAX) packet Take 17 g by mouth daily. 14 each 0  . traZODone (DESYREL) 50 MG tablet Take 25 mg by mouth at bedtime.     . vitamin B-12 (CYANOCOBALAMIN) 1000 MCG tablet Take 1,000 mcg by mouth daily.    Marland Kitchen acetaminophen (TYLENOL) 325 MG tablet Take 2 tablets (650 mg total) by mouth every 6 (six) hours as needed for mild pain, moderate pain or fever. (Patient not taking: Reported on 11/16/2017) 30 tablet 2  . feeding supplement, ENSURE ENLIVE, (ENSURE ENLIVE) LIQD Take 237 mLs by mouth 2 (two) times daily between meals. (Patient not taking: Reported on 11/16/2017) 237 mL 12  . metoprolol tartrate (LOPRESSOR) 25 MG tablet Take 1 tablet (25 mg total) by mouth 2 (two) times daily. (Patient not taking: Reported on 11/16/2017) 60 tablet 1  . senna-docusate (SENOKOT-S) 8.6-50 MG tablet Take 2 tablets by mouth at bedtime as needed for mild constipation.     . traMADol (ULTRAM) 50 MG tablet Take 1 tablet (50 mg total) by mouth every 6 (six) hours as needed for moderate pain or severe pain. (Patient not taking: Reported on 11/16/2017) 30 tablet 0   No current facility-administered medications for this visit.     PHYSICAL EXAMINATION: ECOG PERFORMANCE STATUS: 0 - Asymptomatic  BP (!) 142/80 (BP Location: Left Arm, Patient Position: Sitting)   Pulse 85   Temp 98.2 F (36.8 C) (Tympanic)   Resp 18   Wt 119 lb 3.2 oz (54.1 kg)   BMI 19.24 kg/m   Filed Weights   11/16/17 1337  Weight: 119 lb 3.2 oz (54.1 kg)    Physical Exam   Constitutional:  Frail-appearing Caucasian female patient.  Resting in a wheelchair.  She is accompanied by daughter.  HENT:  Head: Normocephalic and atraumatic.  Mouth/Throat: Oropharynx is clear and moist. No oropharyngeal exudate.  Eyes: Pupils are equal, round, and reactive to light.  Neck: Normal range of motion. Neck supple.  Cardiovascular: Normal rate and regular rhythm.  Pulmonary/Chest: No respiratory distress. She has no wheezes.  Abdominal: Soft. Bowel sounds are normal. She exhibits no distension and no mass. There is no tenderness. There is no rebound and no guarding.  Musculoskeletal: Normal range of motion. She exhibits no edema or tenderness.  Neurological: She is alert.  Oriented 2-3.  Skin: Skin is warm.  Psychiatric: Affect normal.       LABORATORY DATA:  I have reviewed the data as listed  Component Value Date/Time   NA 139 11/16/2017 1310   K 3.9 11/16/2017 1310   CL 106 11/16/2017 1310   CO2 25 11/16/2017 1310   GLUCOSE 110 (H) 11/16/2017 1310   BUN 28 (H) 11/16/2017 1310   CREATININE 1.36 (H) 11/16/2017 1310   CALCIUM 9.2 11/16/2017 1310   CALCIUM 10.1 05/26/2017 0419   PROT 7.3 11/16/2017 1310   ALBUMIN 3.3 (L) 11/16/2017 1310   AST 23 11/16/2017 1310   ALT 32 11/16/2017 1310   ALKPHOS 54 11/16/2017 1310   BILITOT 0.9 11/16/2017 1310   GFRNONAA 34 (L) 11/16/2017 1310   GFRAA 40 (L) 11/16/2017 1310    No results found for: SPEP, UPEP  Lab Results  Component Value Date   WBC 8.1 11/16/2017   NEUTROABS 6.9 11/16/2017   HGB 11.3 (L) 11/16/2017   HCT 37.0 11/16/2017   MCV 93.7 11/16/2017   PLT 305 11/16/2017      Chemistry      Component Value Date/Time   NA 139 11/16/2017 1310   K 3.9 11/16/2017 1310   CL 106 11/16/2017 1310   CO2 25 11/16/2017 1310   BUN 28 (H) 11/16/2017 1310   CREATININE 1.36 (H) 11/16/2017 1310      Component Value Date/Time   CALCIUM 9.2 11/16/2017 1310   CALCIUM 10.1 05/26/2017 0419   ALKPHOS 54  11/16/2017 1310   AST 23 11/16/2017 1310   ALT 32 11/16/2017 1310   BILITOT 0.9 11/16/2017 1310       RADIOGRAPHIC STUDIES: I have personally reviewed the radiological images as listed and agreed with the findings in the report. No results found.   ASSESSMENT & PLAN:  Multiple myeloma in relapse ([place]) # Multiple myeloma -IgA kappa; symptomatic.  Stage II.  ON dexamethasone 4 mg 3 times a week;Revlimid 5 mg 2 weeks on 1 week off.  M protein decreasing; AUG 2019 M=1.6; improved.   # Continue Revlimid dexamethasone. Clinically improved; MM/K-l pending today.   # CKD- stage III- 1.3 ; [BL ~1.0]; increase PO intake.    # Anemia hemoglobin- sec to MM-11.3; STABLE.  # Hypercalcemia/MM- improved on Zometa 32m q 4 weeks; again 10/25.   # DISPOSITION: # zometa today.  # follow up in 3 weeks/labs- cbc/bmp- Dr.B   No orders of the defined types were placed in this encounter.  All questions were answered. The patient knows to call the clinic with any problems, questions or concerns.      GCammie Sickle MD 11/21/2017 11:31 AM

## 2017-11-16 NOTE — Progress Notes (Signed)
Here for follow up. " I feel excellent for an old lady" per pt

## 2017-11-16 NOTE — Assessment & Plan Note (Addendum)
#  Multiple myeloma -IgA kappa; symptomatic.  Stage II.  ON dexamethasone 4 mg 3 times a week;Revlimid 5 mg 2 weeks on 1 week off.  M protein decreasing; AUG 2019 M=1.6; improved.   # Continue Revlimid dexamethasone. Clinically improved; MM/K-l pending today.   # CKD- stage III- 1.3 ; [BL ~1.0]; increase PO intake.    # Anemia hemoglobin- sec to MM-11.3; STABLE.  # Hypercalcemia/MM- improved on Zometa '3mg'$  q 4 weeks; again 10/25.   # DISPOSITION: # zometa today.  # follow up in 3 weeks/labs- cbc/bmp- Dr.B

## 2017-11-19 LAB — KAPPA/LAMBDA LIGHT CHAINS
Kappa free light chain: 210.6 mg/L — ABNORMAL HIGH (ref 3.3–19.4)
Kappa, lambda light chain ratio: 29.25 — ABNORMAL HIGH (ref 0.26–1.65)
Lambda free light chains: 7.2 mg/L (ref 5.7–26.3)

## 2017-11-20 DIAGNOSIS — E039 Hypothyroidism, unspecified: Secondary | ICD-10-CM | POA: Diagnosis not present

## 2017-11-20 LAB — MULTIPLE MYELOMA PANEL, SERUM
Albumin SerPl Elph-Mcnc: 3.2 g/dL (ref 2.9–4.4)
Albumin/Glob SerPl: 0.9 (ref 0.7–1.7)
Alpha 1: 0.2 g/dL (ref 0.0–0.4)
Alpha2 Glob SerPl Elph-Mcnc: 0.8 g/dL (ref 0.4–1.0)
B-Globulin SerPl Elph-Mcnc: 2.2 g/dL — ABNORMAL HIGH (ref 0.7–1.3)
Gamma Glob SerPl Elph-Mcnc: 0.5 g/dL (ref 0.4–1.8)
Globulin, Total: 3.7 g/dL (ref 2.2–3.9)
IgA: 2137 mg/dL — ABNORMAL HIGH (ref 64–422)
IgG (Immunoglobin G), Serum: 349 mg/dL — ABNORMAL LOW (ref 700–1600)
IgM (Immunoglobulin M), Srm: <5 mg/dL — ABNORMAL LOW (ref 26–217)
M Protein SerPl Elph-Mcnc: 1.3 g/dL — ABNORMAL HIGH
Total Protein ELP: 6.9 g/dL (ref 6.0–8.5)

## 2017-11-30 ENCOUNTER — Other Ambulatory Visit: Payer: Self-pay | Admitting: *Deleted

## 2017-11-30 DIAGNOSIS — C9002 Multiple myeloma in relapse: Secondary | ICD-10-CM

## 2017-11-30 MED ORDER — LENALIDOMIDE 5 MG PO CAPS: 5.0000 mg | ORAL_CAPSULE | Freq: Every day | ORAL | 0 refills | Status: DC

## 2017-12-03 ENCOUNTER — Other Ambulatory Visit: Payer: Self-pay | Admitting: Internal Medicine

## 2017-12-03 DIAGNOSIS — C9002 Multiple myeloma in relapse: Secondary | ICD-10-CM

## 2017-12-07 ENCOUNTER — Inpatient Hospital Stay: Payer: PPO | Admitting: Internal Medicine

## 2017-12-07 ENCOUNTER — Inpatient Hospital Stay: Payer: PPO

## 2017-12-07 ENCOUNTER — Inpatient Hospital Stay: Payer: PPO | Attending: Internal Medicine

## 2017-12-07 ENCOUNTER — Other Ambulatory Visit: Payer: Self-pay

## 2017-12-07 VITALS — BP 134/79 | HR 84 | Temp 97.8°F | Wt 125.0 lb

## 2017-12-07 DIAGNOSIS — I129 Hypertensive chronic kidney disease with stage 1 through stage 4 chronic kidney disease, or unspecified chronic kidney disease: Secondary | ICD-10-CM

## 2017-12-07 DIAGNOSIS — N183 Chronic kidney disease, stage 3 (moderate): Secondary | ICD-10-CM | POA: Diagnosis not present

## 2017-12-07 DIAGNOSIS — C9002 Multiple myeloma in relapse: Secondary | ICD-10-CM

## 2017-12-07 DIAGNOSIS — E7800 Pure hypercholesterolemia, unspecified: Secondary | ICD-10-CM | POA: Insufficient documentation

## 2017-12-07 DIAGNOSIS — Z79899 Other long term (current) drug therapy: Secondary | ICD-10-CM | POA: Diagnosis not present

## 2017-12-07 DIAGNOSIS — D631 Anemia in chronic kidney disease: Secondary | ICD-10-CM | POA: Insufficient documentation

## 2017-12-07 DIAGNOSIS — I10 Essential (primary) hypertension: Secondary | ICD-10-CM | POA: Insufficient documentation

## 2017-12-07 LAB — CBC WITH DIFFERENTIAL/PLATELET
Abs Immature Granulocytes: 0.06 10*3/uL (ref 0.00–0.07)
BASOS ABS: 0.1 10*3/uL (ref 0.0–0.1)
Basophils Relative: 1 %
EOS ABS: 0 10*3/uL (ref 0.0–0.5)
EOS PCT: 0 %
HCT: 36 % (ref 36.0–46.0)
Hemoglobin: 11.1 g/dL — ABNORMAL LOW (ref 12.0–15.0)
Immature Granulocytes: 1 %
LYMPHS ABS: 0.6 10*3/uL — AB (ref 0.7–4.0)
Lymphocytes Relative: 7 %
MCH: 29.1 pg (ref 26.0–34.0)
MCHC: 30.8 g/dL (ref 30.0–36.0)
MCV: 94.5 fL (ref 80.0–100.0)
MONO ABS: 0.4 10*3/uL (ref 0.1–1.0)
Monocytes Relative: 5 %
NRBC: 0 % (ref 0.0–0.2)
Neutro Abs: 7.9 10*3/uL — ABNORMAL HIGH (ref 1.7–7.7)
Neutrophils Relative %: 86 %
Platelets: 277 10*3/uL (ref 150–400)
RBC: 3.81 MIL/uL — AB (ref 3.87–5.11)
RDW: 15.9 % — AB (ref 11.5–15.5)
WBC: 9 10*3/uL (ref 4.0–10.5)

## 2017-12-07 LAB — COMPREHENSIVE METABOLIC PANEL
ALK PHOS: 57 U/L (ref 38–126)
ALT: 27 U/L (ref 0–44)
AST: 17 U/L (ref 15–41)
Albumin: 3.2 g/dL — ABNORMAL LOW (ref 3.5–5.0)
Anion gap: 9 (ref 5–15)
BILIRUBIN TOTAL: 0.7 mg/dL (ref 0.3–1.2)
BUN: 26 mg/dL — ABNORMAL HIGH (ref 8–23)
CALCIUM: 8.7 mg/dL — AB (ref 8.9–10.3)
CO2: 24 mmol/L (ref 22–32)
CREATININE: 1.19 mg/dL — AB (ref 0.44–1.00)
Chloride: 105 mmol/L (ref 98–111)
GFR, EST AFRICAN AMERICAN: 47 mL/min — AB (ref >60)
GFR, EST NON AFRICAN AMERICAN: 40 mL/min — AB (ref >60)
Glucose, Bld: 107 mg/dL — ABNORMAL HIGH (ref 70–99)
Potassium: 3.6 mmol/L (ref 3.5–5.1)
Sodium: 138 mmol/L (ref 135–145)
Total Protein: 7.2 g/dL (ref 6.5–8.1)

## 2017-12-07 NOTE — Progress Notes (Signed)
[place] OFFICE PROGRESS NOTE  Patient Care Team: [name], MD as PCP - General (Family Medicine)  Cancer Staging No matching staging information was found for the patient.   Oncology History   # MAY 2019- MULTIPLE MYELOMA [No BMBx]; IgA-K [baseline- 3.4 gm/dl; ]  # Hypercalcemia- s/p Zometa 80m IVP [May 2019]  # Mod dementia; s/p Left hip Fx [may 2019]  -------------------------------------------------------------------------   DIAGNOSIS: [MAY 2019]- ACTIVE MULTIPLE MYELOMA  STAGE:  II  ; GOALS: PALLIATIVE  CURRENT/MOST RECENT THERAPY: Dex 464m3/w; Rev 57m68mw-On/1w-OFF      [name]    INTERVAL HISTORY:  [name] 36o.  female pleasant patient above history of dementia; also history of multiple myeloma currently on Revlimid dexamethasone is here for follow-up.  Denies any nausea vomiting or diarrhea.  Appetite is fair.  No chills.  No tingling or numbness.   Review of Systems  Constitutional: Negative for chills, diaphoresis, fever, malaise/fatigue and weight loss.  HENT: Negative for nosebleeds and sore throat.   Eyes: Negative for double vision.  Respiratory: Negative for cough, hemoptysis, sputum production, shortness of breath and wheezing.   Cardiovascular: Negative for chest pain, palpitations, orthopnea and leg swelling.  Gastrointestinal: Negative for abdominal pain, blood in stool, constipation, diarrhea, heartburn, melena, nausea and vomiting.  Genitourinary: Negative for dysuria, frequency and urgency.  Musculoskeletal: Negative for back pain and joint pain.  Neurological: Negative for dizziness, tingling, focal weakness, weakness and headaches.  Endo/Heme/Allergies: Does not bruise/bleed easily.  Psychiatric/Behavioral: Positive for memory loss. Negative for depression. The patient is not nervous/anxious and does not have insomnia.       PAST MEDICAL HISTORY :  Past Medical History:   Diagnosis Date  . Arthritis    hands  . Cataract   . Claustrophobia   . Depression    with anxiety/h/o agoraphobia  . Glaucoma   . Hip fracture ([facility]  left hip  . HOH (hard of hearing)   . Hyperlipidemia   . Hypertension   . [place] . PONV (postoperative nausea and vomiting)     PAST SURGICAL HISTORY :   Past Surgical History:  Procedure Laterality Date  . CATARACT EXTRACTION W/PHACO Right 03/15/2015   Procedure: CATARACT EXTRACTION PHACO AND INTRAOCULAR LENS PLACEMENT (IOC);  Surgeon: AniRonnell FreshwaterD;  [place] [facility];  Laterality: Right;  . INTRAMEDULLARY (IM) NAIL INTERTROCHANTERIC Left 05/25/2017   Procedure: INTRAMEDULLARY (IM) NAIL INTERTROCHANTRIC;  Surgeon: KraThornton ParkD;  Location: [facility] ORS;  Service: Orthopedics;  Laterality: Left;  . ROTATOR CUFF REPAIR  1999   bilateral    FAMILY HISTORY :   Family History  Problem Relation Age of Onset  . Cancer - Other Son        appendix cancer  . Prostate cancer Brother   . CAD Brother   . Kidney cancer Father   . Breast cancer Sister   . Stroke Sister     SOCIAL HISTORY:   Social History   Tobacco Use  . Smoking status: Never Smoker  . Smokeless tobacco: Never Used  Substance Use Topics  . Alcohol use: Yes    Alcohol/week: 7.0 standard drinks    Types: 7 Glasses of wine per week  . Drug use: No    ALLERGIES:  is allergic to codeine and penicillins.  MEDICATIONS:  Current Outpatient Medications  Medication Sig Dispense Refill  .  acetaminophen (TYLENOL) 325 MG tablet Take 2 tablets (650 mg total) by mouth every 6 (six) hours as needed for mild pain, moderate pain or fever. 30 tablet 2  . amLODipine (NORVASC) 5 MG tablet Take 1 tablet (5 mg total) by mouth daily. 30 tablet 0  . aspirin EC 81 MG tablet Take 1 tablet (81 mg total) by mouth daily. 30 tablet 1  . ATORVASTATIN CALCIUM PO Take 5 mg by mouth daily.     . calcium gluconate  500 MG tablet Take 1 tablet by mouth 2 (two) times daily.    . Cholecalciferol (VITAMIN D) 2000 units tablet Take 1 tablet by mouth daily.    Marland Kitchen dexamethasone (DECADRON) 4 MG tablet TAKE ONE TABLET THREE TIMES A WEEK 12 tablet 4  . feeding supplement, ENSURE ENLIVE, (ENSURE ENLIVE) LIQD Take 237 mLs by mouth 2 (two) times daily between meals. 237 mL 12  . lenalidomide (REVLIMID) 5 MG capsule Take 1 capsule (5 mg total) by mouth daily. Take for 14 days on, then 7 days off. 14 capsule 0  . levothyroxine (SYNTHROID, LEVOTHROID) 25 MCG tablet Take 1 tablet (25 mcg total) by mouth daily before breakfast. 30 tablet 1  . metoprolol tartrate (LOPRESSOR) 25 MG tablet Take 1 tablet (25 mg total) by mouth 2 (two) times daily. 60 tablet 1  . PARoxetine (PAXIL) 40 MG tablet Take 40 mg by mouth at bedtime.    . polyethylene glycol (MIRALAX / GLYCOLAX) packet Take 17 g by mouth daily. 14 each 0  . senna-docusate (SENOKOT-S) 8.6-50 MG tablet Take 2 tablets by mouth at bedtime as needed for mild constipation.     . traMADol (ULTRAM) 50 MG tablet Take 1 tablet (50 mg total) by mouth every 6 (six) hours as needed for moderate pain or severe pain. 30 tablet 0  . traZODone (DESYREL) 50 MG tablet Take 25 mg by mouth at bedtime.     . vitamin B-12 (CYANOCOBALAMIN) 1000 MCG tablet Take 1,000 mcg by mouth daily.     No current facility-administered medications for this visit.     PHYSICAL EXAMINATION: ECOG PERFORMANCE STATUS: 0 - Asymptomatic  BP 134/79 (BP Location: Left Arm, Patient Position: Sitting)   Pulse 84   Temp 97.8 F (36.6 C) (Oral)   Wt 125 lb (56.7 kg)   BMI 20.18 kg/m   Filed Weights   12/07/17 1508  Weight: 125 lb (56.7 kg)    Physical Exam  Constitutional:  Frail-appearing Caucasian female patient.  Resting in a wheelchair.  She is accompanied by daughter.  HENT:  Head: Normocephalic and atraumatic.  Mouth/Throat: Oropharynx is clear and moist. No oropharyngeal exudate.  Eyes: Pupils  are equal, round, and reactive to light.  Neck: Normal range of motion. Neck supple.  Cardiovascular: Normal rate and regular rhythm.  Pulmonary/Chest: No respiratory distress. She has no wheezes.  Abdominal: Soft. Bowel sounds are normal. She exhibits no distension and no mass. There is no tenderness. There is no rebound and no guarding.  Musculoskeletal: Normal range of motion. She exhibits no edema or tenderness.  Neurological: She is alert.  Oriented 2-3.  Skin: Skin is warm.  Psychiatric: Affect normal.       LABORATORY DATA:  I have reviewed the data as listed    Component Value Date/Time   NA 138 12/07/2017 1420   K 3.6 12/07/2017 1420   CL 105 12/07/2017 1420   CO2 24 12/07/2017 1420   GLUCOSE 107 (H) 12/07/2017 1420  BUN 26 (H) 12/07/2017 1420   CREATININE 1.19 (H) 12/07/2017 1420   CALCIUM 8.7 (L) 12/07/2017 1420   CALCIUM 10.1 05/26/2017 0419   PROT 7.2 12/07/2017 1420   ALBUMIN 3.2 (L) 12/07/2017 1420   AST 17 12/07/2017 1420   ALT 27 12/07/2017 1420   ALKPHOS 57 12/07/2017 1420   BILITOT 0.7 12/07/2017 1420   GFRNONAA 40 (L) 12/07/2017 1420   GFRAA 47 (L) 12/07/2017 1420    No results found for: SPEP, UPEP  Lab Results  Component Value Date   WBC 9.0 12/07/2017   NEUTROABS 7.9 (H) 12/07/2017   HGB 11.1 (L) 12/07/2017   HCT 36.0 12/07/2017   MCV 94.5 12/07/2017   PLT 277 12/07/2017      Chemistry      Component Value Date/Time   NA 138 12/07/2017 1420   K 3.6 12/07/2017 1420   CL 105 12/07/2017 1420   CO2 24 12/07/2017 1420   BUN 26 (H) 12/07/2017 1420   CREATININE 1.19 (H) 12/07/2017 1420      Component Value Date/Time   CALCIUM 8.7 (L) 12/07/2017 1420   CALCIUM 10.1 05/26/2017 0419   ALKPHOS 57 12/07/2017 1420   AST 17 12/07/2017 1420   ALT 27 12/07/2017 1420   BILITOT 0.7 12/07/2017 1420       RADIOGRAPHIC STUDIES: I have personally reviewed the radiological images as listed and agreed with the findings in the report. No results  found.   ASSESSMENT & PLAN:  Multiple myeloma in relapse ([place]) # Multiple myeloma -IgA kappa; symptomatic.  Stage II.  ON dexamethasone 4 mg 3 times a week;Revlimid 5 mg 2 weeks on 1 week off.  M protein decreasing; OCT 2019 M=1.3;K/l= ratio=29; improved.  # Continue Revlimid dexamethasone. Clinically improved; MM/K-l pending today.   # CKD- stage III- 1.3 ; [BL ~1.0]; increase PO intake.    # Anemia hemoglobin- sec to MM-11.3; STABLE.  # Hypercalcemia/MM- improved on Zometa 46m q 4 weeks; again 10/25.   # DISPOSITION: # HOLD Zometa # follow up in 3 weeks/labs- cbc/bmp/ Zometa- Dr.B   No orders of the defined types were placed in this encounter.  All questions were answered. The patient knows to call the clinic with any problems, questions or concerns.      GCammie Sickle MD 12/10/2017 7:12 AM

## 2017-12-07 NOTE — Assessment & Plan Note (Addendum)
#  Multiple myeloma -IgA kappa; symptomatic.  Stage II.  ON dexamethasone 4 mg 3 times a week;Revlimid 5 mg 2 weeks on 1 week off.  M protein decreasing; OCT 2019 M=1.3;K/l= ratio=29; improved.  # Continue Revlimid dexamethasone. Clinically improved; MM/K-l pending today.   # CKD- stage III- 1.3 ; [BL ~1.0]; increase PO intake.    # Anemia hemoglobin- sec to MM-11.3; STABLE.  # Hypercalcemia/MM- improved on Zometa 37m q 4 weeks; again 10/25.   # DISPOSITION: # HOLD Zometa # follow up in 3 weeks/labs- cbc/bmp/ Zometa- Dr.B

## 2017-12-10 LAB — KAPPA/LAMBDA LIGHT CHAINS
KAPPA, LAMDA LIGHT CHAIN RATIO: 28.97 — AB (ref 0.26–1.65)
Kappa free light chain: 208.6 mg/L — ABNORMAL HIGH (ref 3.3–19.4)
LAMDA FREE LIGHT CHAINS: 7.2 mg/L (ref 5.7–26.3)

## 2017-12-11 LAB — MULTIPLE MYELOMA PANEL, SERUM
ALBUMIN SERPL ELPH-MCNC: 3.1 g/dL (ref 2.9–4.4)
ALPHA 1: 0.2 g/dL (ref 0.0–0.4)
ALPHA2 GLOB SERPL ELPH-MCNC: 0.7 g/dL (ref 0.4–1.0)
Albumin/Glob SerPl: 0.9 (ref 0.7–1.7)
B-GLOBULIN SERPL ELPH-MCNC: 2.2 g/dL — AB (ref 0.7–1.3)
Gamma Glob SerPl Elph-Mcnc: 0.4 g/dL (ref 0.4–1.8)
Globulin, Total: 3.5 g/dL (ref 2.2–3.9)
IGA: 2190 mg/dL — AB (ref 64–422)
IgG (Immunoglobin G), Serum: 311 mg/dL — ABNORMAL LOW (ref 700–1600)
M PROTEIN SERPL ELPH-MCNC: 1.4 g/dL — AB
TOTAL PROTEIN ELP: 6.6 g/dL (ref 6.0–8.5)

## 2017-12-26 ENCOUNTER — Other Ambulatory Visit: Payer: Self-pay | Admitting: *Deleted

## 2017-12-26 DIAGNOSIS — C9002 Multiple myeloma in relapse: Secondary | ICD-10-CM

## 2017-12-26 MED ORDER — LENALIDOMIDE 5 MG PO CAPS: 5.0000 mg | ORAL_CAPSULE | Freq: Every day | ORAL | 0 refills | Status: DC

## 2017-12-27 ENCOUNTER — Other Ambulatory Visit: Payer: Self-pay

## 2017-12-27 DIAGNOSIS — C9002 Multiple myeloma in relapse: Secondary | ICD-10-CM

## 2017-12-28 ENCOUNTER — Inpatient Hospital Stay: Payer: PPO

## 2017-12-28 ENCOUNTER — Inpatient Hospital Stay: Payer: PPO | Admitting: Internal Medicine

## 2017-12-28 ENCOUNTER — Inpatient Hospital Stay: Payer: PPO | Attending: Internal Medicine

## 2017-12-28 ENCOUNTER — Other Ambulatory Visit: Payer: Self-pay

## 2017-12-28 VITALS — BP 161/83 | HR 80 | Temp 98.1°F | Resp 20 | Ht 66.0 in | Wt 128.0 lb

## 2017-12-28 DIAGNOSIS — N183 Chronic kidney disease, stage 3 (moderate): Secondary | ICD-10-CM | POA: Insufficient documentation

## 2017-12-28 DIAGNOSIS — F0390 Unspecified dementia without behavioral disturbance: Secondary | ICD-10-CM | POA: Diagnosis not present

## 2017-12-28 DIAGNOSIS — C9002 Multiple myeloma in relapse: Secondary | ICD-10-CM

## 2017-12-28 DIAGNOSIS — R509 Fever, unspecified: Secondary | ICD-10-CM | POA: Diagnosis not present

## 2017-12-28 DIAGNOSIS — D630 Anemia in neoplastic disease: Secondary | ICD-10-CM | POA: Diagnosis not present

## 2017-12-28 DIAGNOSIS — I129 Hypertensive chronic kidney disease with stage 1 through stage 4 chronic kidney disease, or unspecified chronic kidney disease: Secondary | ICD-10-CM

## 2017-12-28 DIAGNOSIS — J189 Pneumonia, unspecified organism: Secondary | ICD-10-CM | POA: Insufficient documentation

## 2017-12-28 DIAGNOSIS — E860 Dehydration: Secondary | ICD-10-CM | POA: Insufficient documentation

## 2017-12-28 LAB — COMPREHENSIVE METABOLIC PANEL WITH GFR
ALT: 27 U/L (ref 0–44)
AST: 20 U/L (ref 15–41)
Albumin: 3.3 g/dL — ABNORMAL LOW (ref 3.5–5.0)
Alkaline Phosphatase: 48 U/L (ref 38–126)
Anion gap: 10 (ref 5–15)
BUN: 26 mg/dL — ABNORMAL HIGH (ref 8–23)
CO2: 24 mmol/L (ref 22–32)
Calcium: 8.9 mg/dL (ref 8.9–10.3)
Chloride: 105 mmol/L (ref 98–111)
Creatinine, Ser: 1.18 mg/dL — ABNORMAL HIGH (ref 0.44–1.00)
GFR calc Af Amer: 48 mL/min — ABNORMAL LOW (ref >60)
GFR calc non Af Amer: 42 mL/min — ABNORMAL LOW (ref >60)
Glucose, Bld: 102 mg/dL — ABNORMAL HIGH (ref 70–99)
Potassium: 3.9 mmol/L (ref 3.5–5.1)
Sodium: 139 mmol/L (ref 135–145)
Total Bilirubin: 0.8 mg/dL (ref 0.3–1.2)
Total Protein: 7.2 g/dL (ref 6.5–8.1)

## 2017-12-28 LAB — CBC WITH DIFFERENTIAL/PLATELET
Abs Immature Granulocytes: 0.06 10*3/uL (ref 0.00–0.07)
Basophils Absolute: 0.1 10*3/uL (ref 0.0–0.1)
Basophils Relative: 1 %
Eosinophils Absolute: 0 10*3/uL (ref 0.0–0.5)
Eosinophils Relative: 0 %
HCT: 36.4 % (ref 36.0–46.0)
Hemoglobin: 11.1 g/dL — ABNORMAL LOW (ref 12.0–15.0)
IMMATURE GRANULOCYTES: 1 %
Lymphocytes Relative: 7 %
Lymphs Abs: 0.6 10*3/uL — ABNORMAL LOW (ref 0.7–4.0)
MCH: 29.2 pg (ref 26.0–34.0)
MCHC: 30.5 g/dL (ref 30.0–36.0)
MCV: 95.8 fL (ref 80.0–100.0)
Monocytes Absolute: 0.5 10*3/uL (ref 0.1–1.0)
Monocytes Relative: 6 %
NRBC: 0 % (ref 0.0–0.2)
Neutro Abs: 6.7 10*3/uL (ref 1.7–7.7)
Neutrophils Relative %: 85 %
PLATELETS: 295 10*3/uL (ref 150–400)
RBC: 3.8 MIL/uL — ABNORMAL LOW (ref 3.87–5.11)
RDW: 15.7 % — ABNORMAL HIGH (ref 11.5–15.5)
WBC: 7.9 10*3/uL (ref 4.0–10.5)

## 2017-12-28 MED ORDER — SODIUM CHLORIDE 0.9 % IV SOLN
INTRAVENOUS | Status: DC
  Administered 2017-12-28: 14:00:00 via INTRAVENOUS
  Filled 2017-12-28: qty 250

## 2017-12-28 MED ORDER — ZOLEDRONIC ACID 4 MG/5ML IV CONC
3.0000 mg | Freq: Once | INTRAVENOUS | Status: AC
  Administered 2017-12-28: 3 mg via INTRAVENOUS
  Filled 2017-12-28: qty 3.75

## 2017-12-28 NOTE — Progress Notes (Signed)
[place] OFFICE PROGRESS NOTE  Patient Care Team: [name], MD as PCP - General (Family Medicine)  Cancer Staging No matching staging information was found for the patient.   Oncology History   # MAY 2019- MULTIPLE MYELOMA [No BMBx]; IgA-K [baseline- 3.4 gm/dl; ]  # Hypercalcemia- s/p Zometa 39m IVP [May 2019]  # Mod dementia; s/p Left hip Fx [may 2019]  ----------------------------------------------------------------------  DIAGNOSIS: [MAY 2019]- ACTIVE MULTIPLE MYELOMA  STAGE:  II  ; GOALS: PALLIATIVE  CURRENT/MOST RECENT THERAPY: Dex 421m3/w; Rev 64m46mw-On/1w-OFF      Multiple myeloma in relapse ([facility]    INTERVAL HISTORY:  Theresa Lynch 42o.  female pleasant patient above history of dementia; also history of multiple myeloma currently on Revlimid dexamethasone is here for follow-up.  As per the family patient is having " mood swings"; otherwise appetite is good.  No weight loss.  Complains of mild swelling in the legs.  No skin rash.  No falls.  Patient had a bruise of her right arm while picking her dog.   Review of Systems  Constitutional: Negative for chills, diaphoresis, fever, malaise/fatigue and weight loss.  HENT: Negative for nosebleeds and sore throat.   Eyes: Negative for double vision.  Respiratory: Negative for cough, hemoptysis, sputum production, shortness of breath and wheezing.   Cardiovascular: Positive for leg swelling. Negative for chest pain, palpitations and orthopnea.  Gastrointestinal: Negative for abdominal pain, blood in stool, constipation, diarrhea, heartburn, melena, nausea and vomiting.  Genitourinary: Negative for dysuria, frequency and urgency.  Musculoskeletal: Negative for back pain and joint pain.  Neurological: Negative for dizziness, tingling, focal weakness, weakness and headaches.  Endo/Heme/Allergies: Does not bruise/bleed easily.  Psychiatric/Behavioral: Positive for memory loss.  Negative for depression. The patient is not nervous/anxious and does not have insomnia.       PAST MEDICAL HISTORY :  Past Medical History:  Diagnosis Date  . Arthritis    hands  . Cataract   . Claustrophobia   . Depression    with anxiety/h/o agoraphobia  . Glaucoma   . [place] fracture ([facility]  left hip  . HOH (hard of hearing)   . Hyperlipidemia   . Hypertension   . Multiple myeloma ([facility] . PONV (postoperative nausea and vomiting)     PAST SURGICAL HISTORY :   Past Surgical History:  Procedure Laterality Date  . CATARACT EXTRACTION W/PHACO Right 03/15/2015   Procedure: CATARACT EXTRACTION PHACO AND INTRAOCULAR LENS PLACEMENT (IOC);  Surgeon: AniRonnell FreshwaterD;  [facility];  Laterality: Right;  . INTRAMEDULLARY (IM) NAIL INTERTROCHANTERIC Left 05/25/2017   Procedure: INTRAMEDULLARY (IM) NAIL INTERTROCHANTRIC;  Surgeon: KraThornton ParkD;  Location: [facility] ORS;  Service: Orthopedics;  Laterality: Left;  . ROTATOR CUFF REPAIR  1999   bilateral    FAMILY HISTORY :   Family History  Problem Relation Age of Onset  . Cancer - Other Son        appendix cancer  . Prostate cancer Brother   . CAD Brother   . Kidney cancer Father   . Breast cancer Sister   . Stroke Sister     SOCIAL HISTORY:   Social History   Tobacco Use  . Smoking status: Never Smoker  . Smokeless tobacco: Never Used  Substance Use Topics  . Alcohol use: Yes    Alcohol/week: 7.0 standard drinks    Types: 7 Glasses of wine per week  .  Drug use: No    ALLERGIES:  is allergic to codeine and penicillins.  MEDICATIONS:  Current Outpatient Medications  Medication Sig Dispense Refill  . acetaminophen (TYLENOL) 325 MG tablet Take 2 tablets (650 mg total) by mouth every 6 (six) hours as needed for mild pain, moderate pain or fever. 30 tablet 2  . amLODipine (NORVASC) 5 MG tablet Take 1 tablet (5 mg total) by mouth daily. 30 tablet 0  . aspirin EC 81  MG tablet Take 1 tablet (81 mg total) by mouth daily. 30 tablet 1  . ATORVASTATIN CALCIUM PO Take 5 mg by mouth daily.     . calcium gluconate 500 MG tablet Take 1 tablet by mouth 2 (two) times daily.    . Cholecalciferol (VITAMIN D) 2000 units tablet Take 1 tablet by mouth daily.    Marland Kitchen dexamethasone (DECADRON) 4 MG tablet TAKE ONE TABLET THREE TIMES A WEEK 12 tablet 4  . feeding supplement, ENSURE ENLIVE, (ENSURE ENLIVE) LIQD Take 237 mLs by mouth 2 (two) times daily between meals. 237 mL 12  . lenalidomide (REVLIMID) 5 MG capsule Take 1 capsule (5 mg total) by mouth daily. Take for 14 days on, then 7 days off. 14 capsule 0  . levothyroxine (SYNTHROID, LEVOTHROID) 25 MCG tablet Take 1 tablet (25 mcg total) by mouth daily before breakfast. 30 tablet 1  . metoprolol tartrate (LOPRESSOR) 25 MG tablet Take 1 tablet (25 mg total) by mouth 2 (two) times daily. 60 tablet 1  . PARoxetine (PAXIL) 40 MG tablet Take 40 mg by mouth at bedtime.    . polyethylene glycol (MIRALAX / GLYCOLAX) packet Take 17 g by mouth daily. 14 each 0  . senna-docusate (SENOKOT-S) 8.6-50 MG tablet Take 2 tablets by mouth at bedtime as needed for mild constipation.     . traMADol (ULTRAM) 50 MG tablet Take 1 tablet (50 mg total) by mouth every 6 (six) hours as needed for moderate pain or severe pain. 30 tablet 0  . traZODone (DESYREL) 50 MG tablet Take 25 mg by mouth at bedtime.     . vitamin B-12 (CYANOCOBALAMIN) 1000 MCG tablet Take 1,000 mcg by mouth daily.     No current facility-administered medications for this visit.    Facility-Administered Medications Ordered in Other Visits  Medication Dose Route Frequency Provider Last Rate Last Dose  . 0.9 %  sodium chloride infusion   Intravenous Continuous Cammie Sickle, MD   Stopped at 12/28/17 1440    PHYSICAL EXAMINATION: ECOG PERFORMANCE STATUS: 1 - Symptomatic but completely ambulatory  BP (!) 161/83 (BP Location: Right Arm, Patient Position: Sitting)   Pulse 80    Temp 98.1 F (36.7 C) (Oral)   Resp 20   Ht '5\' 6"'  (1.676 m)   Wt 128 lb (58.1 kg)   BMI 20.66 kg/m   Filed Weights   12/28/17 1324  Weight: 128 lb (58.1 kg)    Physical Exam  Constitutional:  [place] Caucasian female patient.  Resting in a wheelchair.  She is accompanied by daughter.  HENT:  Head: Normocephalic and atraumatic.  Mouth/Throat: Oropharynx is clear and moist. No oropharyngeal exudate.  Eyes: Pupils are equal, round, and reactive to light.  Neck: Normal range of motion. Neck supple.  Cardiovascular: Normal rate and regular rhythm.  Pulmonary/Chest: No respiratory distress. She has no wheezes.  Abdominal: Soft. Bowel sounds are normal. She exhibits no distension and no mass. There is no tenderness. There is no rebound and no guarding.  Musculoskeletal:  Normal range of motion. She exhibits no edema or tenderness.  Neurological: She is alert.  Oriented 2-3.  Skin: Skin is warm.  Right upper extremity focal swelling/erythema.  No warmth.  No signs of infection.  Psychiatric: Affect normal.       LABORATORY DATA:  I have reviewed the data as listed    Component Value Date/Time   NA 139 12/28/2017 1306   K 3.9 12/28/2017 1306   CL 105 12/28/2017 1306   CO2 24 12/28/2017 1306   GLUCOSE 102 (H) 12/28/2017 1306   BUN 26 (H) 12/28/2017 1306   CREATININE 1.18 (H) 12/28/2017 1306   CALCIUM 8.9 12/28/2017 1306   CALCIUM 10.1 05/26/2017 0419   PROT 7.2 12/28/2017 1306   ALBUMIN 3.3 (L) 12/28/2017 1306   AST 20 12/28/2017 1306   ALT 27 12/28/2017 1306   ALKPHOS 48 12/28/2017 1306   BILITOT 0.8 12/28/2017 1306   GFRNONAA 42 (L) 12/28/2017 1306   GFRAA 48 (L) 12/28/2017 1306    No results found for: SPEP, UPEP  Lab Results  Component Value Date   WBC 7.9 12/28/2017   NEUTROABS 6.7 12/28/2017   HGB 11.1 (L) 12/28/2017   HCT 36.4 12/28/2017   MCV 95.8 12/28/2017   PLT 295 12/28/2017      Chemistry      Component Value Date/Time   NA 139  12/28/2017 1306   K 3.9 12/28/2017 1306   CL 105 12/28/2017 1306   CO2 24 12/28/2017 1306   BUN 26 (H) 12/28/2017 1306   CREATININE 1.18 (H) 12/28/2017 1306      Component Value Date/Time   CALCIUM 8.9 12/28/2017 1306   CALCIUM 10.1 05/26/2017 0419   ALKPHOS 48 12/28/2017 1306   AST 20 12/28/2017 1306   ALT 27 12/28/2017 1306   BILITOT 0.8 12/28/2017 1306       RADIOGRAPHIC STUDIES: I have personally reviewed the radiological images as listed and agreed with the findings in the report. No results found.   ASSESSMENT & PLAN:  Multiple myeloma in relapse ([place]) # Multiple myeloma -IgA kappa; symptomatic.  Stage II.  ON dexamethasone 4 mg 3 times a week;Revlimid 5 mg 2 weeks on 1 week off.  M protein decreasing-0.3gm/dl; OCT 2019 M=1.3;K/l= ratio=29; STABLE.   # Continue Revlimid dexamethasone; start cycle today.   #Discussed regarding steroid intolerance [mood swings/mild swelling of the legs]-discussed regarding cutting down the dose of dexamethasone to 4 mg a week.  However patient/family wanted to keep dexamethasone at the current dose of 12 mg/week.  # CKD- stage III- 1.3 ; [BL ~1.0]; STABLE.   # Anemia hemoglobin- sec to MM-11.3; STABLE.   # Right arm bruise- recommend ice packing/tylenol  # Hypercalcemia/MM- improved on Zometa 53m q 6 weeks;  # DISPOSITION: # Zometa today.  # follow up in 6 weeks/labs- cbc/Cmp/zometa- MM panel;K-light chain- Dr.B   No orders of the defined types were placed in this encounter.  All questions were answered. The patient knows to call the clinic with any problems, questions or concerns.      GCammie Sickle MD 12/28/2017 5:02 PM

## 2017-12-28 NOTE — Assessment & Plan Note (Addendum)
#  Multiple myeloma -IgA kappa; symptomatic.  Stage II.  ON dexamethasone 4 mg 3 times a week;Revlimid 5 mg 2 weeks on 1 week off.  M protein decreasing-0.3gm/dl; OCT 2019 M=1.3;K/l= ratio=29; STABLE.   # Continue Revlimid dexamethasone; start cycle today.   #Discussed regarding steroid intolerance [mood swings/mild swelling of the legs]-discussed regarding cutting down the dose of dexamethasone to 4 mg a week.  However patient/family wanted to keep dexamethasone at the current dose of 12 mg/week.  # CKD- stage III- 1.3 ; [BL ~1.0]; STABLE.   # Anemia hemoglobin- sec to MM-11.3; STABLE.   # Right arm bruise- recommend ice packing/tylenol  # Hypercalcemia/MM- improved on Zometa 29m q 6 weeks;  # DISPOSITION: # Zometa today.  # follow up in 6 weeks/labs- cbc/Cmp/zometa- MM panel;K-light chain- Dr.B

## 2017-12-31 LAB — KAPPA/LAMBDA LIGHT CHAINS
Kappa free light chain: 207.3 mg/L — ABNORMAL HIGH (ref 3.3–19.4)
Kappa, lambda light chain ratio: 50.56 — ABNORMAL HIGH (ref 0.26–1.65)
LAMDA FREE LIGHT CHAINS: 4.1 mg/L — AB (ref 5.7–26.3)

## 2018-01-02 LAB — MULTIPLE MYELOMA PANEL, SERUM
Albumin SerPl Elph-Mcnc: 3.3 g/dL (ref 2.9–4.4)
Albumin/Glob SerPl: 0.9 (ref 0.7–1.7)
Alpha 1: 0.2 g/dL (ref 0.0–0.4)
Alpha2 Glob SerPl Elph-Mcnc: 0.8 g/dL (ref 0.4–1.0)
B-Globulin SerPl Elph-Mcnc: 2.3 g/dL — ABNORMAL HIGH (ref 0.7–1.3)
GAMMA GLOB SERPL ELPH-MCNC: 0.4 g/dL (ref 0.4–1.8)
Globulin, Total: 3.7 g/dL (ref 2.2–3.9)
IgA: 2643 mg/dL — ABNORMAL HIGH (ref 64–422)
IgG (Immunoglobin G), Serum: 288 mg/dL — ABNORMAL LOW (ref 700–1600)
IgM (Immunoglobulin M), Srm: 6 mg/dL — ABNORMAL LOW (ref 26–217)
M Protein SerPl Elph-Mcnc: 1.5 g/dL — ABNORMAL HIGH
Total Protein ELP: 7 g/dL (ref 6.0–8.5)

## 2018-01-09 ENCOUNTER — Telehealth: Payer: Self-pay | Admitting: *Deleted

## 2018-01-09 DIAGNOSIS — R509 Fever, unspecified: Secondary | ICD-10-CM

## 2018-01-09 DIAGNOSIS — Z5189 Encounter for other specified aftercare: Secondary | ICD-10-CM

## 2018-01-09 NOTE — Telephone Encounter (Signed)
Per Dr. Jacinto Reap - Please schedule asap with [facility] NP

## 2018-01-09 NOTE — Telephone Encounter (Signed)
Daughter called reporting that patient has temp of 100.5, and that she is on oral chemotherapy. She has a productive cough of white / clear sputum. She reports that patient gets this every year. She asks how we want to handle this with her being in a "compromised state". Please advise

## 2018-01-09 NOTE — Telephone Encounter (Signed)
Appointment offered for today, but declined and so I offered 830 tomorrow, but declined, accepted 1030 appointment for lab then NP

## 2018-01-10 ENCOUNTER — Inpatient Hospital Stay: Payer: PPO

## 2018-01-10 ENCOUNTER — Ambulatory Visit
Admission: RE | Admit: 2018-01-10 | Discharge: 2018-01-10 | Disposition: A | Discharge status: Home / Self Care | Payer: PPO | Source: Home / Self Care | Attending: Oncology | Admitting: Oncology

## 2018-01-10 ENCOUNTER — Inpatient Hospital Stay (HOSPITAL_BASED_OUTPATIENT_CLINIC_OR_DEPARTMENT_OTHER): Payer: PPO | Admitting: Oncology

## 2018-01-10 ENCOUNTER — Other Ambulatory Visit: Payer: Self-pay

## 2018-01-10 ENCOUNTER — Ambulatory Visit
Admission: RE | Admit: 2018-01-10 | Discharge: 2018-01-10 | Disposition: A | Discharge status: Home / Self Care | Payer: PPO | Source: Ambulatory Visit | Attending: Oncology | Admitting: Oncology

## 2018-01-10 VITALS — BP 124/75 | HR 87 | Temp 98.2°F | Resp 18

## 2018-01-10 DIAGNOSIS — Z7982 Long term (current) use of aspirin: Secondary | ICD-10-CM

## 2018-01-10 DIAGNOSIS — M810 Age-related osteoporosis without current pathological fracture: Secondary | ICD-10-CM | POA: Diagnosis not present

## 2018-01-10 DIAGNOSIS — R05 Cough: Secondary | ICD-10-CM

## 2018-01-10 DIAGNOSIS — J189 Pneumonia, unspecified organism: Secondary | ICD-10-CM | POA: Diagnosis not present

## 2018-01-10 DIAGNOSIS — Z8051 Family history of malignant neoplasm of kidney: Secondary | ICD-10-CM | POA: Diagnosis not present

## 2018-01-10 DIAGNOSIS — Z885 Allergy status to narcotic agent status: Secondary | ICD-10-CM

## 2018-01-10 DIAGNOSIS — E860 Dehydration: Secondary | ICD-10-CM | POA: Diagnosis not present

## 2018-01-10 DIAGNOSIS — Z87310 Personal history of (healed) osteoporosis fracture: Secondary | ICD-10-CM

## 2018-01-10 DIAGNOSIS — I472 Ventricular tachycardia: Secondary | ICD-10-CM | POA: Diagnosis not present

## 2018-01-10 DIAGNOSIS — F418 Other specified anxiety disorders: Secondary | ICD-10-CM | POA: Diagnosis present

## 2018-01-10 DIAGNOSIS — I248 Other forms of acute ischemic heart disease: Secondary | ICD-10-CM | POA: Diagnosis present

## 2018-01-10 DIAGNOSIS — J121 Respiratory syncytial virus pneumonia: Secondary | ICD-10-CM | POA: Diagnosis not present

## 2018-01-10 DIAGNOSIS — C9002 Multiple myeloma in relapse: Secondary | ICD-10-CM

## 2018-01-10 DIAGNOSIS — R509 Fever, unspecified: Secondary | ICD-10-CM

## 2018-01-10 DIAGNOSIS — C9000 Multiple myeloma not having achieved remission: Secondary | ICD-10-CM | POA: Diagnosis present

## 2018-01-10 DIAGNOSIS — Z800 Family history of malignant neoplasm of digestive organs: Secondary | ICD-10-CM

## 2018-01-10 DIAGNOSIS — M19042 Primary osteoarthritis, left hand: Secondary | ICD-10-CM | POA: Diagnosis present

## 2018-01-10 DIAGNOSIS — Z880 Allergy status to penicillin: Secondary | ICD-10-CM

## 2018-01-10 DIAGNOSIS — Z87820 Personal history of traumatic brain injury: Secondary | ICD-10-CM

## 2018-01-10 DIAGNOSIS — M19041 Primary osteoarthritis, right hand: Secondary | ICD-10-CM | POA: Diagnosis not present

## 2018-01-10 DIAGNOSIS — R0602 Shortness of breath: Secondary | ICD-10-CM | POA: Diagnosis not present

## 2018-01-10 DIAGNOSIS — E039 Hypothyroidism, unspecified: Secondary | ICD-10-CM | POA: Diagnosis not present

## 2018-01-10 DIAGNOSIS — Z66 Do not resuscitate: Secondary | ICD-10-CM | POA: Diagnosis present

## 2018-01-10 DIAGNOSIS — I491 Atrial premature depolarization: Secondary | ICD-10-CM | POA: Diagnosis not present

## 2018-01-10 DIAGNOSIS — Z961 Presence of intraocular lens: Secondary | ICD-10-CM | POA: Diagnosis present

## 2018-01-10 DIAGNOSIS — I129 Hypertensive chronic kidney disease with stage 1 through stage 4 chronic kidney disease, or unspecified chronic kidney disease: Secondary | ICD-10-CM | POA: Diagnosis not present

## 2018-01-10 DIAGNOSIS — Z79890 Hormone replacement therapy (postmenopausal): Secondary | ICD-10-CM

## 2018-01-10 DIAGNOSIS — Z9841 Cataract extraction status, right eye: Secondary | ICD-10-CM

## 2018-01-10 DIAGNOSIS — Z8042 Family history of malignant neoplasm of prostate: Secondary | ICD-10-CM

## 2018-01-10 DIAGNOSIS — E785 Hyperlipidemia, unspecified: Secondary | ICD-10-CM | POA: Diagnosis present

## 2018-01-10 DIAGNOSIS — Z8249 Family history of ischemic heart disease and other diseases of the circulatory system: Secondary | ICD-10-CM

## 2018-01-10 DIAGNOSIS — E872 Acidosis: Secondary | ICD-10-CM | POA: Diagnosis not present

## 2018-01-10 DIAGNOSIS — Z5189 Encounter for other specified aftercare: Secondary | ICD-10-CM

## 2018-01-10 DIAGNOSIS — E876 Hypokalemia: Secondary | ICD-10-CM

## 2018-01-10 DIAGNOSIS — Z7952 Long term (current) use of systemic steroids: Secondary | ICD-10-CM

## 2018-01-10 DIAGNOSIS — Z803 Family history of malignant neoplasm of breast: Secondary | ICD-10-CM | POA: Diagnosis not present

## 2018-01-10 DIAGNOSIS — N183 Chronic kidney disease, stage 3 (moderate): Secondary | ICD-10-CM | POA: Diagnosis not present

## 2018-01-10 DIAGNOSIS — Z79899 Other long term (current) drug therapy: Secondary | ICD-10-CM

## 2018-01-10 DIAGNOSIS — Z79891 Long term (current) use of opiate analgesic: Secondary | ICD-10-CM

## 2018-01-10 DIAGNOSIS — R7989 Other specified abnormal findings of blood chemistry: Secondary | ICD-10-CM | POA: Diagnosis not present

## 2018-01-10 DIAGNOSIS — Z823 Family history of stroke: Secondary | ICD-10-CM

## 2018-01-10 LAB — COMPREHENSIVE METABOLIC PANEL
ALT: 17 U/L (ref 0–44)
AST: 13 U/L — AB (ref 15–41)
Albumin: 3.3 g/dL — ABNORMAL LOW (ref 3.5–5.0)
Alkaline Phosphatase: 48 U/L (ref 38–126)
Anion gap: 11 (ref 5–15)
BUN: 24 mg/dL — ABNORMAL HIGH (ref 8–23)
CO2: 23 mmol/L (ref 22–32)
Calcium: 8.7 mg/dL — ABNORMAL LOW (ref 8.9–10.3)
Chloride: 104 mmol/L (ref 98–111)
Creatinine, Ser: 1.02 mg/dL — ABNORMAL HIGH (ref 0.44–1.00)
GFR calc Af Amer: 58 mL/min — ABNORMAL LOW (ref >60)
GFR calc non Af Amer: 50 mL/min — ABNORMAL LOW (ref >60)
Glucose, Bld: 90 mg/dL (ref 70–99)
POTASSIUM: 3.2 mmol/L — AB (ref 3.5–5.1)
Sodium: 138 mmol/L (ref 135–145)
TOTAL PROTEIN: 7.5 g/dL (ref 6.5–8.1)
Total Bilirubin: 1.5 mg/dL — ABNORMAL HIGH (ref 0.3–1.2)

## 2018-01-10 LAB — URINALYSIS, COMPLETE (UACMP) WITH MICROSCOPIC
BILIRUBIN URINE: NEGATIVE
Bacteria, UA: NONE SEEN
Glucose, UA: NEGATIVE mg/dL
Hgb urine dipstick: NEGATIVE
Ketones, ur: NEGATIVE mg/dL
Leukocytes, UA: NEGATIVE
Nitrite: NEGATIVE
Protein, ur: 100 mg/dL — AB
SPECIFIC GRAVITY, URINE: 1.024 (ref 1.005–1.030)
pH: 5 (ref 5.0–8.0)

## 2018-01-10 LAB — CBC WITH DIFFERENTIAL/PLATELET
Abs Immature Granulocytes: 0.1 10*3/uL — ABNORMAL HIGH (ref 0.00–0.07)
BASOS PCT: 0 %
Basophils Absolute: 0 10*3/uL (ref 0.0–0.1)
Eosinophils Absolute: 0 10*3/uL (ref 0.0–0.5)
Eosinophils Relative: 0 %
HCT: 34.8 % — ABNORMAL LOW (ref 36.0–46.0)
Hemoglobin: 10.8 g/dL — ABNORMAL LOW (ref 12.0–15.0)
Immature Granulocytes: 1 %
Lymphocytes Relative: 6 %
Lymphs Abs: 0.9 10*3/uL (ref 0.7–4.0)
MCH: 29.2 pg (ref 26.0–34.0)
MCHC: 31 g/dL (ref 30.0–36.0)
MCV: 94.1 fL (ref 80.0–100.0)
Monocytes Absolute: 2 10*3/uL — ABNORMAL HIGH (ref 0.1–1.0)
Monocytes Relative: 12 %
NEUTROS ABS: 12.8 10*3/uL — AB (ref 1.7–7.7)
Neutrophils Relative %: 81 %
Platelets: 339 10*3/uL (ref 150–400)
RBC: 3.7 MIL/uL — AB (ref 3.87–5.11)
RDW: 15.4 % (ref 11.5–15.5)
WBC: 15.8 10*3/uL — ABNORMAL HIGH (ref 4.0–10.5)
nRBC: 0 % (ref 0.0–0.2)

## 2018-01-10 MED ORDER — LEVOFLOXACIN 250 MG PO TABS: 250.0000 mg | ORAL_TABLET | Freq: Every day | ORAL | 0 refills | Status: DC

## 2018-01-10 MED ORDER — SODIUM CHLORIDE 0.9 % IV SOLN
INTRAVENOUS | Status: DC
  Administered 2018-01-10: 12:00:00 via INTRAVENOUS
  Filled 2018-01-10 (×2): qty 1000

## 2018-01-10 MED ORDER — CIPROFLOXACIN HCL 500 MG PO TABS: 500.0000 mg | ORAL_TABLET | Freq: Two times a day (BID) | ORAL | 0 refills | Status: DC

## 2018-01-10 MED ORDER — POTASSIUM CHLORIDE ER 10 MEQ PO TBCR: 10.0000 meq | EXTENDED_RELEASE_TABLET | Freq: Every day | ORAL | 0 refills | Status: DC

## 2018-01-10 MED ORDER — AZITHROMYCIN 250 MG PO TABS: ORAL_TABLET | ORAL | 0 refills | Status: DC

## 2018-01-10 NOTE — Progress Notes (Signed)
Symptom Management Consult note [facility]  [phone] [phone]  Patient Care Team: [name], MD as PCP - General (Family Medicine)   Name of the patient: Theresa Lynch  [id number]  [birth year]   Date of visit: 01/10/2018  Diagnosis: Multiple myeloma  Chief Complaint: Fever  Current Treatment: Revlimid with Dex- 2 weeks on and 1 week off  Oncology History: Patient was last seen by primary oncologist Dr. Rogue Bussing on 12/28/2017 for follow-up.  At that visit, it was reported she was having acute swings but otherwise doing well with a good appetite.  He complained of mild swelling in bilateral legs.  She denied falls or skin rash.  Began next cycle of Revlimid/dexamethasone.  Tolerating well.  Labs remained stable.  Discussed tolerance of steroid with mood swings and mild leg swelling.  Discussed cutting down dexamethasone if needed in future.  Continued to have hypercalcemia secondary to multiple myeloma.  Continue Zometa every 6 weeks.  Patient's daughter called on 01/09/2018 reporting patient had a temperature of 100.5.  Complained of productive cough with white/clear sputum.  Offered an appointment in [facility] and it was accepted for next day.   Oncology History   # MAY 2019- MULTIPLE MYELOMA [No BMBx]; IgA-K [baseline- 3.4 gm/dl; ]  # Hypercalcemia- s/p Zometa 40m IVP [May 2019]  # Mod dementia; s/p Left hip Fx [may 2019]  ----------------------------------------------------------------------  DIAGNOSIS: [MAY 2019]- ACTIVE MULTIPLE MYELOMA  STAGE:  II  ; GOALS: PALLIATIVE  CURRENT/MOST RECENT THERAPY: Dex 478m3/w; Rev 58m79mw-On/1w-OFF      [place] Subjective Data:   ECOG: 2 - Symptomatic, <50% confined to bed   Subjective:     Theresa Lynch a [age]o. female who presents for evaluation of fever. She has had the fever for 2 days. Symptoms have been gradually worsening. Symptoms are  described as fevers up to 100.5 degrees, and are worse in the evening. Associated symptoms are poor appetite and URI symptoms. Patient denies abdominal pain, diarrhea, nausea and urinary tract symptoms.  She has tried to alleviate the symptoms with acetaminophen with minimal relief. The patient has cancer, immunocompromised state active multiple myeloma treatment.  The following portions of the patient's history were reviewed and updated as appropriate: allergies, current medications, past family history, past medical history, past social history, past surgical history and problem list.  Review of Systems A comprehensive review of systems was negative except for: Constitutional: positive for anorexia, fatigue, fevers and malaise Respiratory: positive for cough, dyspnea on exertion and pneumonia Musculoskeletal: positive for muscle weakness Neurological: positive for memory problems   Objective:    BP 124/75 (BP Location: Left Arm, Patient Position: Sitting)   Pulse 87   Temp 98.2 F (36.8 C) (Tympanic)   Resp 18   SpO2 98%  General appearance: alert, fatigued and no distress Lungs: diminished breath sounds LLL and RLL Heart: regular rate and rhythm, S1, S2 normal, no murmur, click, rub or gallop Abdomen: soft, non-tender; bowel sounds normal; no masses,  no organomegaly Skin: Skin color, texture, turgor normal. No rashes or lesions Neurologic: Mental status: orientation: time, date   Assessment:    Fever is likely secondary to pneumonia.   Plan:    Supportive care with appropriate antipyretics and fluids. Obtain labs per orders. Chest x-ray. Antibiotics as per orders.    Multiple myeloma: s/p several cycles of Revlimid and dexamethasone.  Last cycle started on 12/28/2017-01/10/18.  Beginning 1 week  off today.  Tolerated well to date.  Scheduled to return to clinic for labs and assessment by Dr. Rogue Bussing on 02/07/2018.   Fever: T-max 100.5.  Afebrile while in clinic.  Cough with  sputum production (clear/white).  On assessment, lungs are diminished bilateral lower lobes.  Patient appears well.  Blood pressure stable.   Hypokalemia: No diarrhea.  Possibly due to medications and/or dehydration. RX potassium 10 mEq daily X 10 days.   Plan: Stat labs: Blood cultures x2, urinalysis and urine culture, CBC, CMP.  Urinalysis positive for protein possibly indicating dehydration.  Will give fluids as mentioned below.  No UTI.  Blood cultures negative. Stat chest x-ray.  Chest x-ray revealed new right upper lobe infiltrate indicative of pneumonia. RX Levaquin 250 mg daily x10 days. 1 L NaCl while in clinic.  Can use cough medicine OTC as needed.  Recommend Delsym.  RX potassium 10 mEq daily X 10 days.   Curb criteria: Given patient's age, patient meets criteria for inpatient admission for IV antibiotics.  Spoke with daughter regarding recommendations.  Daughter states she is eating okay and drinking fluids.  She would like to try to stay out of the hospital if possible.  Consulted with primary oncologist Dr. Rogue Bussing, and will try Levaquin 250 mg daily x10 days.  Given her age and kidney function will dose reduce Levaquin from 500 mg twice daily to 250 daily.  Instructed patient and daughter that if fever worsens or fails to improve, to call clinic or to proceed to emergency department over the weekend.  They are in agreement with plan.  Greater than 50% was spent in counseling and coordination of care with this patient including but not limited to discussion of the relevant topics above (See A&P) including, but not limited to diagnosis and management of acute and chronic medical conditions.   Faythe Casa, NP 01/14/2018 8:44 AM

## 2018-01-10 NOTE — Progress Notes (Signed)
Patient here today to be evaluated in the symptom management clinic for fever and cough which started a few days ago. Patient denies having any nausea, vomiting or diarrhea. She has not taken any medication for the fever or the cough. She states that she is feeling more weak and fatigued than her usual.

## 2018-01-11 LAB — URINE CULTURE

## 2018-01-13 ENCOUNTER — Telehealth: Payer: Self-pay | Admitting: Hematology and Oncology

## 2018-01-13 ENCOUNTER — Emergency Department: Payer: PPO

## 2018-01-13 ENCOUNTER — Inpatient Hospital Stay
Admission: EM | Admit: 2018-01-13 | Discharge: 2018-01-15 | DRG: 194 | Disposition: A | Discharge status: Home / Self Care | Payer: PPO | Attending: Internal Medicine | Admitting: Internal Medicine

## 2018-01-13 ENCOUNTER — Other Ambulatory Visit: Payer: Self-pay

## 2018-01-13 DIAGNOSIS — Z961 Presence of intraocular lens: Secondary | ICD-10-CM | POA: Diagnosis present

## 2018-01-13 DIAGNOSIS — Z66 Do not resuscitate: Secondary | ICD-10-CM | POA: Diagnosis present

## 2018-01-13 DIAGNOSIS — Z87820 Personal history of traumatic brain injury: Secondary | ICD-10-CM | POA: Diagnosis not present

## 2018-01-13 DIAGNOSIS — N183 Chronic kidney disease, stage 3 (moderate): Secondary | ICD-10-CM | POA: Diagnosis present

## 2018-01-13 DIAGNOSIS — Z9841 Cataract extraction status, right eye: Secondary | ICD-10-CM | POA: Diagnosis not present

## 2018-01-13 DIAGNOSIS — Z8051 Family history of malignant neoplasm of kidney: Secondary | ICD-10-CM | POA: Diagnosis not present

## 2018-01-13 DIAGNOSIS — E876 Hypokalemia: Secondary | ICD-10-CM | POA: Diagnosis present

## 2018-01-13 DIAGNOSIS — F418 Other specified anxiety disorders: Secondary | ICD-10-CM | POA: Diagnosis present

## 2018-01-13 DIAGNOSIS — R7989 Other specified abnormal findings of blood chemistry: Secondary | ICD-10-CM

## 2018-01-13 DIAGNOSIS — J181 Lobar pneumonia, unspecified organism: Secondary | ICD-10-CM

## 2018-01-13 DIAGNOSIS — J189 Pneumonia, unspecified organism: Secondary | ICD-10-CM | POA: Diagnosis present

## 2018-01-13 DIAGNOSIS — Z87310 Personal history of (healed) osteoporosis fracture: Secondary | ICD-10-CM | POA: Diagnosis not present

## 2018-01-13 DIAGNOSIS — Z800 Family history of malignant neoplasm of digestive organs: Secondary | ICD-10-CM | POA: Diagnosis not present

## 2018-01-13 DIAGNOSIS — R778 Other specified abnormalities of plasma proteins: Secondary | ICD-10-CM

## 2018-01-13 DIAGNOSIS — C9000 Multiple myeloma not having achieved remission: Secondary | ICD-10-CM | POA: Diagnosis present

## 2018-01-13 DIAGNOSIS — E785 Hyperlipidemia, unspecified: Secondary | ICD-10-CM | POA: Diagnosis present

## 2018-01-13 DIAGNOSIS — I248 Other forms of acute ischemic heart disease: Secondary | ICD-10-CM | POA: Diagnosis present

## 2018-01-13 DIAGNOSIS — E039 Hypothyroidism, unspecified: Secondary | ICD-10-CM | POA: Diagnosis present

## 2018-01-13 DIAGNOSIS — M810 Age-related osteoporosis without current pathological fracture: Secondary | ICD-10-CM | POA: Diagnosis present

## 2018-01-13 DIAGNOSIS — Z803 Family history of malignant neoplasm of breast: Secondary | ICD-10-CM | POA: Diagnosis not present

## 2018-01-13 DIAGNOSIS — E860 Dehydration: Secondary | ICD-10-CM | POA: Diagnosis present

## 2018-01-13 DIAGNOSIS — I491 Atrial premature depolarization: Secondary | ICD-10-CM | POA: Diagnosis present

## 2018-01-13 DIAGNOSIS — Z8042 Family history of malignant neoplasm of prostate: Secondary | ICD-10-CM | POA: Diagnosis not present

## 2018-01-13 DIAGNOSIS — J121 Respiratory syncytial virus pneumonia: Secondary | ICD-10-CM | POA: Diagnosis present

## 2018-01-13 DIAGNOSIS — I129 Hypertensive chronic kidney disease with stage 1 through stage 4 chronic kidney disease, or unspecified chronic kidney disease: Secondary | ICD-10-CM | POA: Diagnosis present

## 2018-01-13 DIAGNOSIS — E872 Acidosis: Secondary | ICD-10-CM | POA: Diagnosis present

## 2018-01-13 DIAGNOSIS — M19041 Primary osteoarthritis, right hand: Secondary | ICD-10-CM | POA: Diagnosis present

## 2018-01-13 DIAGNOSIS — M19042 Primary osteoarthritis, left hand: Secondary | ICD-10-CM | POA: Diagnosis present

## 2018-01-13 LAB — CBC
HCT: 35.7 % — ABNORMAL LOW (ref 36.0–46.0)
HCT: 30.7 % — ABNORMAL LOW (ref 36.0–46.0)
Hemoglobin: 11 g/dL — ABNORMAL LOW (ref 12.0–15.0)
Hemoglobin: 9.6 g/dL — ABNORMAL LOW (ref 12.0–15.0)
MCH: 29.3 pg (ref 26.0–34.0)
MCH: 29.5 pg (ref 26.0–34.0)
MCHC: 30.8 g/dL (ref 30.0–36.0)
MCHC: 31.3 g/dL (ref 30.0–36.0)
MCV: 95.2 fL (ref 80.0–100.0)
MCV: 94.5 fL (ref 80.0–100.0)
PLATELETS: 343 10*3/uL (ref 150–400)
Platelets: 277 10*3/uL (ref 150–400)
RBC: 3.75 MIL/uL — ABNORMAL LOW (ref 3.87–5.11)
RBC: 3.25 MIL/uL — ABNORMAL LOW (ref 3.87–5.11)
RDW: 15.4 % (ref 11.5–15.5)
RDW: 15.3 % (ref 11.5–15.5)
WBC: 9.2 10*3/uL (ref 4.0–10.5)
WBC: 8 10*3/uL (ref 4.0–10.5)
nRBC: 0 % (ref 0.0–0.2)
nRBC: 0 % (ref 0.0–0.2)

## 2018-01-13 LAB — LACTIC ACID, PLASMA: Lactic Acid, Venous: 1.5 mmol/L (ref 0.5–1.9)

## 2018-01-13 LAB — BASIC METABOLIC PANEL
Anion gap: 12 (ref 5–15)
BUN: 14 mg/dL (ref 8–23)
CO2: 20 mmol/L — ABNORMAL LOW (ref 22–32)
CREATININE: 0.92 mg/dL (ref 0.44–1.00)
Calcium: 8.4 mg/dL — ABNORMAL LOW (ref 8.9–10.3)
Chloride: 105 mmol/L (ref 98–111)
GFR calc non Af Amer: 56 mL/min — ABNORMAL LOW (ref >60)
Glucose, Bld: 98 mg/dL (ref 70–99)
Potassium: 3.4 mmol/L — ABNORMAL LOW (ref 3.5–5.1)
Sodium: 137 mmol/L (ref 135–145)

## 2018-01-13 LAB — INFLUENZA PANEL BY PCR (TYPE A & B)
Influenza A By PCR: NEGATIVE
Influenza B By PCR: NEGATIVE

## 2018-01-13 LAB — CG4 I-STAT (LACTIC ACID): Lactic Acid, Venous: 2.83 mmol/L (ref 0.5–1.9)

## 2018-01-13 LAB — TROPONIN I
Troponin I: 0.03 ng/mL (ref <0.03)
Troponin I: 0.06 ng/mL (ref <0.03)

## 2018-01-13 MED ORDER — SODIUM CHLORIDE 0.9 % IV SOLN
1.0000 g | Freq: Once | INTRAVENOUS | Status: AC
  Administered 2018-01-13: 1 g via INTRAVENOUS
  Filled 2018-01-13: qty 10

## 2018-01-13 MED ORDER — VITAMIN D3 25 MCG (1000 UNIT) PO TABS
2000.0000 [IU] | ORAL_TABLET | Freq: Two times a day (BID) | ORAL | Status: DC
  Administered 2018-01-13 – 2018-01-15 (×3): 2000 [IU] via ORAL
  Filled 2018-01-13 (×7): qty 2

## 2018-01-13 MED ORDER — ONDANSETRON HCL 4 MG PO TABS: 4.0000 mg | ORAL_TABLET | Freq: Four times a day (QID) | ORAL | Status: DC | PRN

## 2018-01-13 MED ORDER — INFLUENZA VAC SPLIT HIGH-DOSE 0.5 ML IM SUSY
0.5000 mL | PREFILLED_SYRINGE | INTRAMUSCULAR | Status: DC
  Filled 2018-01-13 (×2): qty 0.5

## 2018-01-13 MED ORDER — POLYETHYLENE GLYCOL 3350 17 G PO PACK: 17.0000 g | PACK | Freq: Every day | ORAL | Status: DC | PRN

## 2018-01-13 MED ORDER — SODIUM CHLORIDE 0.9 % IV SOLN
500.0000 mg | Freq: Once | INTRAVENOUS | Status: AC
  Administered 2018-01-13: 500 mg via INTRAVENOUS
  Filled 2018-01-13: qty 500

## 2018-01-13 MED ORDER — DEXTROMETHORPHAN POLISTIREX ER 30 MG/5ML PO SUER
30.0000 mg | Freq: Two times a day (BID) | ORAL | Status: DC
  Administered 2018-01-13 – 2018-01-15 (×4): 30 mg via ORAL
  Filled 2018-01-13 (×5): qty 5

## 2018-01-13 MED ORDER — CALCIUM GLUCONATE 500 MG PO TABS
1.0000 | ORAL_TABLET | Freq: Two times a day (BID) | ORAL | Status: DC
  Filled 2018-01-13 (×5): qty 1

## 2018-01-13 MED ORDER — BISACODYL 5 MG PO TBEC: 5.0000 mg | DELAYED_RELEASE_TABLET | Freq: Every day | ORAL | Status: DC | PRN

## 2018-01-13 MED ORDER — METOPROLOL TARTRATE 25 MG PO TABS
25.0000 mg | ORAL_TABLET | Freq: Two times a day (BID) | ORAL | Status: DC
  Administered 2018-01-13 – 2018-01-14 (×2): 25 mg via ORAL
  Filled 2018-01-13 (×2): qty 1

## 2018-01-13 MED ORDER — DOCUSATE SODIUM 100 MG PO CAPS
100.0000 mg | ORAL_CAPSULE | Freq: Two times a day (BID) | ORAL | Status: DC
  Administered 2018-01-14 – 2018-01-15 (×2): 100 mg via ORAL
  Filled 2018-01-13 (×4): qty 1

## 2018-01-13 MED ORDER — DM-GUAIFENESIN ER 30-600 MG PO TB12: 1.0000 | ORAL_TABLET | Freq: Two times a day (BID) | ORAL | Status: DC

## 2018-01-13 MED ORDER — ACETAMINOPHEN 325 MG PO TABS: 650.0000 mg | ORAL_TABLET | Freq: Four times a day (QID) | ORAL | Status: DC | PRN

## 2018-01-13 MED ORDER — PAROXETINE HCL 20 MG PO TABS
40.0000 mg | ORAL_TABLET | Freq: Every day | ORAL | Status: DC
  Administered 2018-01-13 – 2018-01-14 (×2): 40 mg via ORAL
  Filled 2018-01-13 (×3): qty 2

## 2018-01-13 MED ORDER — ONDANSETRON HCL 4 MG/2ML IJ SOLN: 4.0000 mg | Freq: Four times a day (QID) | INTRAMUSCULAR | Status: DC | PRN

## 2018-01-13 MED ORDER — SODIUM CHLORIDE 0.9 % IV SOLN
500.0000 mg | INTRAVENOUS | Status: DC
  Filled 2018-01-13: qty 500

## 2018-01-13 MED ORDER — SODIUM CHLORIDE 0.9 % IV SOLN
INTRAVENOUS | Status: DC
  Administered 2018-01-13 – 2018-01-14 (×2): via INTRAVENOUS

## 2018-01-13 MED ORDER — ACETAMINOPHEN 650 MG RE SUPP: 650.0000 mg | Freq: Four times a day (QID) | RECTAL | Status: DC | PRN

## 2018-01-13 MED ORDER — ATORVASTATIN CALCIUM 10 MG PO TABS
5.0000 mg | ORAL_TABLET | Freq: Every day | ORAL | Status: DC
  Administered 2018-01-13 – 2018-01-15 (×3): 5 mg via ORAL
  Filled 2018-01-13 (×3): qty 1

## 2018-01-13 MED ORDER — TRAMADOL HCL 50 MG PO TABS: 50.0000 mg | ORAL_TABLET | Freq: Four times a day (QID) | ORAL | Status: DC | PRN

## 2018-01-13 MED ORDER — ASPIRIN EC 81 MG PO TBEC
81.0000 mg | DELAYED_RELEASE_TABLET | Freq: Every day | ORAL | Status: DC
  Administered 2018-01-14 – 2018-01-15 (×2): 81 mg via ORAL
  Filled 2018-01-13 (×2): qty 1

## 2018-01-13 MED ORDER — VITAMIN B-12 1000 MCG PO TABS
1000.0000 ug | ORAL_TABLET | Freq: Every day | ORAL | Status: DC
  Administered 2018-01-14 – 2018-01-15 (×2): 1000 ug via ORAL
  Filled 2018-01-13 (×2): qty 1

## 2018-01-13 MED ORDER — AMLODIPINE BESYLATE 5 MG PO TABS
5.0000 mg | ORAL_TABLET | Freq: Every day | ORAL | Status: DC
  Administered 2018-01-14 – 2018-01-15 (×2): 5 mg via ORAL
  Filled 2018-01-13 (×2): qty 1

## 2018-01-13 MED ORDER — LEVOTHYROXINE SODIUM 25 MCG PO TABS
25.0000 ug | ORAL_TABLET | Freq: Every day | ORAL | Status: DC
  Administered 2018-01-14 – 2018-01-15 (×2): 25 ug via ORAL
  Filled 2018-01-13 (×2): qty 1

## 2018-01-13 MED ORDER — SODIUM CHLORIDE 0.9 % IV BOLUS
1000.0000 mL | Freq: Once | INTRAVENOUS | Status: AC
  Administered 2018-01-13: 1000 mL via INTRAVENOUS

## 2018-01-13 MED ORDER — GUAIFENESIN ER 600 MG PO TB12
600.0000 mg | ORAL_TABLET | Freq: Two times a day (BID) | ORAL | Status: DC
  Administered 2018-01-13 – 2018-01-15 (×4): 600 mg via ORAL
  Filled 2018-01-13 (×4): qty 1

## 2018-01-13 MED ORDER — DEXAMETHASONE 4 MG PO TABS
4.0000 mg | ORAL_TABLET | ORAL | Status: DC
  Administered 2018-01-14: 4 mg via ORAL
  Filled 2018-01-13: qty 1

## 2018-01-13 MED ORDER — SODIUM CHLORIDE 0.9 % IV SOLN
1.0000 g | INTRAVENOUS | Status: AC
  Administered 2018-01-14: 1 g via INTRAVENOUS
  Filled 2018-01-13: qty 1

## 2018-01-13 MED ORDER — ASPIRIN 81 MG PO CHEW
324.0000 mg | CHEWABLE_TABLET | Freq: Once | ORAL | Status: AC
  Administered 2018-01-13: 324 mg via ORAL
  Filled 2018-01-13: qty 4

## 2018-01-13 MED ORDER — TRAZODONE HCL 50 MG PO TABS
25.0000 mg | ORAL_TABLET | Freq: Every day | ORAL | Status: DC
  Administered 2018-01-13 – 2018-01-14 (×2): 25 mg via ORAL
  Filled 2018-01-13 (×2): qty 1

## 2018-01-13 MED ORDER — ENOXAPARIN SODIUM 40 MG/0.4ML ~~LOC~~ SOLN
40.0000 mg | SUBCUTANEOUS | Status: DC
  Administered 2018-01-13 – 2018-01-14 (×2): 40 mg via SUBCUTANEOUS
  Filled 2018-01-13 (×2): qty 0.4

## 2018-01-13 NOTE — ED Notes (Signed)
Lactic 2.83 by Bethena Roys

## 2018-01-13 NOTE — ED Triage Notes (Signed)
Hx of multiple myeloma. Saw oncologist and had chest xray which showed pneumonia. Family states cough and congestion. Pt coughing in triage. Family states low grade fever x 2 days. Appears SOB after coughing.

## 2018-01-13 NOTE — ED Notes (Signed)
Admitting at bedside 

## 2018-01-13 NOTE — ED Notes (Signed)
ONE SET OF BLOOD CULTURES SENT DOWN TO LAB

## 2018-01-13 NOTE — ED Provider Notes (Signed)
[facility] Emergency Department Provider Note       Time seen: ----------------------------------------- 5:57 PM on 01/13/2018 -----------------------------------------   I have reviewed the triage vital signs and the nursing notes.  HISTORY   Chief Complaint Pneumonia    HPI Theresa Lynch is a 84 y.o. female with a history of arthritis, cataracts, claustrophobia, depression, hip fracture, hyperlipidemia, hypertension, multiple myeloma who presents to the ED for possible pneumonia.  Patient saw her oncologist and had a chest x-ray recently which showed pneumonia.  She has been on 3 days of Levaquin with no improvement.  Family reports continued cough and congestion with low-grade fever.  She appears short of breath after coughing.  Past Medical History:  Diagnosis Date  . Arthritis    hands  . Cataract   . Claustrophobia   . Depression    with anxiety/h/o agoraphobia  . Glaucoma   . Hip fracture ([place])    left hip  . HOH (hard of hearing)   . Hyperlipidemia   . Hypertension   . Multiple myeloma ([place])   . PONV (postoperative nausea and vomiting)     Patient Active Problem List   Diagnosis Date Noted  . Essential hypertension 12/07/2017  . Pure hypercholesterolemia 12/07/2017  . Acquired hypothyroidism 11/20/2017  . DNR (do not resuscitate) 10/15/2017  . Multiple myeloma in relapse ([place]) 06/15/2017  . Age-related osteoporosis with current pathological fracture with routine healing 06/01/2017  . Hip fracture ([place]) 05/25/2017  . Stage 3 chronic kidney disease ([place]) 08/21/2016  . Borderline diabetes mellitus 04/13/2015  . Macrocytic anemia 04/13/2015  . Vaccine counseling 04/12/2015  . Benign paroxysmal positional vertigo 09/27/2012  . Leucocytosis 08/23/2012  . TBI (traumatic brain injury) ([place]) 08/21/2012  . Hypokalemia 08/21/2012  . Hyponatremia 08/21/2012  . Syncope 08/21/2012  . Chest pain 08/21/2012  . Fall 08/18/2012  . Acute  respiratory failure ([place]) 08/18/2012  . Acute blood loss anemia 08/18/2012  . Intracerebral hemorrhage ([place]) 08/15/2012  . Subarachnoid hemorrhage ([place]) 08/15/2012  . Closed basilar skull fracture with subarachnoid hemorrhage ([place]) 08/15/2012  . Closed fracture of facial bones ([place]) 08/15/2012  . History of intracranial hemorrhage 07/23/2012    Past Surgical History:  Procedure Laterality Date  . CATARACT EXTRACTION W/PHACO Right 03/15/2015   Procedure: CATARACT EXTRACTION PHACO AND INTRAOCULAR LENS PLACEMENT (IOC);  Surgeon: Ronnell Freshwater, MD;  Location: [place];  Service: Ophthalmology;  Laterality: Right;  . HIP SURGERY    . INTRAMEDULLARY (IM) NAIL INTERTROCHANTERIC Left 05/25/2017   Procedure: INTRAMEDULLARY (IM) NAIL INTERTROCHANTRIC;  Surgeon: Thornton Park, MD;  Location: [facility];  Service: Orthopedics;  Laterality: Left;  . ROTATOR CUFF REPAIR  1999   bilateral    Allergies Codeine and Penicillins  Social History Social History   Tobacco Use  . Smoking status: Never Smoker  . Smokeless tobacco: Never Used  Substance Use Topics  . Alcohol use: Yes    Alcohol/week: 7.0 standard drinks    Types: 7 Glasses of wine per week  . Drug use: No   Review of Systems Constitutional: Negative for fever. ENT: Positive for congestion Cardiovascular: Negative for chest pain. Respiratory: Positive for shortness of breath and cough Gastrointestinal: Negative for abdominal pain, vomiting and diarrhea. Musculoskeletal: Negative for back pain. Skin: Negative for rash. Neurological: Negative for headaches, focal weakness or numbness.  All systems negative/normal/unremarkable except as stated in the HPI  ____________________________________________   PHYSICAL EXAM:  VITAL SIGNS: ED Triage Vitals [01/13/18 1639]  Enc  Vitals Group     BP 134/73     Pulse Rate 94     Resp 18     Temp 98.5 F (36.9 C)     Temp Source Oral     SpO2 93 %     Weight 125  lb (56.7 kg)     Height '5\' 5"'  (1.651 m)     Head Circumference      Peak Flow      Pain Score 0     Pain Loc      Pain Edu?      Excl. in [place]?    Constitutional: Alert and oriented.  No acute distress ENT   Head: Normocephalic and atraumatic.   Nose: No congestion/rhinnorhea.   Mouth/Throat: Mucous membranes are moist.   Neck: No stridor. Cardiovascular: Normal rate, regular rhythm. No murmurs, rubs, or gallops. Respiratory: Normal respiratory effort without tachypnea nor retractions.  Congestion and rhonchi noted on the right Gastrointestinal: Soft and nontender. Normal bowel sounds Musculoskeletal: Nontender with normal range of motion in extremities. No lower extremity tenderness nor edema. Neurologic:  Normal speech and language. No gross focal neurologic deficits are appreciated.  Skin:  Skin is warm, dry and intact. No rash noted. Psychiatric: Mood and affect are normal. Speech and behavior are normal.  ____________________________________________  EKG: Interpreted by me.  Sinus rhythm with PACs, likely anterior infarct age-indeterminate, normal QRS, normal QT, leftward axis  ____________________________________________  ED COURSE:  As part of my medical decision making, I reviewed the following data within the [place] History obtained from family if available, nursing notes, old chart and ekg, as well as notes from prior ED visits. Patient presented for persistent cough, we will assess with labs and imaging as indicated at this time.   Procedures ____________________________________________   LABS (pertinent positives/negatives)  Labs Reviewed  BASIC METABOLIC PANEL - Abnormal; Notable for the following components:      Result Value   Potassium 3.4 (*)    CO2 20 (*)    Calcium 8.4 (*)    GFR calc non Af Amer 56 (*)    All other components within normal limits  CBC - Abnormal; Notable for the following components:   RBC 3.75 (*)     Hemoglobin 11.0 (*)    HCT 35.7 (*)    All other components within normal limits  TROPONIN I - Abnormal; Notable for the following components:   Troponin I 0.03 (*)    All other components within normal limits  CULTURE, BLOOD (ROUTINE X 2)  CULTURE, BLOOD (ROUTINE X 2)  INFLUENZA PANEL BY PCR (TYPE A & B)  I-STAT CG4 LACTIC ACID, ED    RADIOLOGY Images were viewed by me  Chest x-ray IMPRESSION: 1. Partial but incomplete clearing of right upper lobe infiltrate. 2. Worsening of right middle lobe and lower lobe airspace disease suspicious for pneumonia. Imaging follow-up to resolution is recommended. ____________________________________________  DIFFERENTIAL DIAGNOSIS   Pneumonia, sepsis, UTI, URI, influenza  FINAL ASSESSMENT AND PLAN  Community-acquired pneumonia, elevated troponin   Plan: The patient had presented for persistent cough. Patient's labs did reveal an elevated troponin but otherwise white blood cell count was normal. Patient's imaging unfortunately revealed worsening of her air space disease.  She has failed outpatient treatment with 3 days of Levaquin.  I have ordered IV Rocephin and azithromycin as well as given her aspirin.  I will also check for flu.  I have discussed with the hospitalist for  admission.   Laurence Aly, MD   Note: This note was generated in part or whole with voice recognition software. Voice recognition is usually quite accurate but there are transcription errors that can and very often do occur. I apologize for any typographical errors that were not detected and corrected.     Earleen Newport, MD 01/13/18 [phone]

## 2018-01-13 NOTE — ED Notes (Signed)
ED TO INPATIENT HANDOFF REPORT  Name/Age/Gender Unk Theresa Lynch 84 y.o. female  Code Status Code Status History    Date Active Date Inactive Code Status Order ID Comments User Context   05/25/2017 2355 05/31/2017 1950 DNR [id number]  [name], MD Inpatient   05/25/2017 0157 05/25/2017 1245 Full Code [id number]  Harrie Foreman, MD Inpatient    Questions for Most Recent Historical Code Status (Order [id number])    Question Answer Comment   In the event of cardiac or respiratory ARREST Do not call a "code blue"    In the event of cardiac or respiratory ARREST Do not perform Intubation, CPR, defibrillation or ACLS    In the event of cardiac or respiratory ARREST Use medication by any route, position, wound care, and other measures to relive pain and suffering. May use oxygen, suction and manual treatment of airway obstruction as needed for comfort.         Advance Directive Documentation     Most Recent Value  Type of Advance Directive  Healthcare Power of Attorney, Living will  Pre-existing out of facility DNR order (yellow form or pink MOST form)  -  "MOST" Form in Place?  -      Home/SNF/Other Home  Chief Complaint Doctor Referred  Level of Care/Admitting Diagnosis ED Disposition    ED Disposition Condition [place]: [place] [[id number]]  Level of Care: Telemetry [5]  Diagnosis: Pneumonia [737106]  Admitting Physician: Sedalia Muta [[id number]]  Attending Physician: Sedalia Muta [[id number]]  Estimated length of stay: 3 - 4 days  Certification:: I certify this patient will need inpatient services for at least 2 midnights  PT Class (Do Not Modify): Inpatient [101]  PT Acc Code (Do Not Modify): Private [1]       Medical History Past Medical History:  Diagnosis Date  . Arthritis    hands  . Cataract   . Claustrophobia   . Depression    with anxiety/h/o agoraphobia  . Glaucoma   . Hip fracture ([place])    left hip  .  HOH (hard of hearing)   . Hyperlipidemia   . Hypertension   . Multiple myeloma ([place])   . PONV (postoperative nausea and vomiting)     Allergies Allergies  Allergen Reactions  . Codeine Nausea And Vomiting  . Penicillins Swelling and Other (See Comments)    Lips swell Has patient had a PCN reaction causing immediate rash, facial/tongue/throat swelling, SOB or lightheadedness with hypotension: Yes Has patient had a PCN reaction causing severe rash involving mucus membranes or skin necrosis: No Has patient had a PCN reaction that required hospitalization: No Has patient had a PCN reaction occurring within the last 10 years: No If all of the above answers are "NO", then may proceed with Cephalosporin use.     IV Location/Drains/Wounds Patient Lines/Drains/Airways Status   Active Line/Drains/Airways    Name:   Placement date:   Placement time:   Site:   Days:   Peripheral IV 01/13/18 Left Antecubital   01/13/18    1657    Antecubital   less than 1   Peripheral IV 01/13/18 Right Antecubital   01/13/18    1800    Antecubital   less than 1   Incision (Closed) 05/25/17 Hip Left   05/25/17    1501     233          Labs/Imaging Results for orders placed or performed during the  hospital encounter of 01/13/18 (from the past 48 hour(s))  Basic metabolic panel     Status: Abnormal   Collection Time: 01/13/18  4:55 PM  Result Value Ref Range   Sodium 137 135 - 145 mmol/L   Potassium 3.4 (L) 3.5 - 5.1 mmol/L   Chloride 105 98 - 111 mmol/L   CO2 20 (L) 22 - 32 mmol/L   Glucose, Bld 98 70 - 99 mg/dL   BUN 14 8 - 23 mg/dL   Creatinine, Ser 0.92 0.44 - 1.00 mg/dL   Calcium 8.4 (L) 8.9 - 10.3 mg/dL   GFR calc non Af Amer 56 (L) >60 mL/min   GFR calc Af Amer >60 >60 mL/min   Anion gap 12 5 - 15    Comment: Performed at [facility], [address] [place], [place] [address]  CBC     Status: Abnormal   Collection Time: 01/13/18  4:55 PM  Result Value Ref Range   WBC 9.2 4.0 -  10.5 K/uL   RBC 3.75 (L) 3.87 - 5.11 MIL/uL   Hemoglobin 11.0 (L) 12.0 - 15.0 g/dL   HCT 35.7 (L) 36.0 - 46.0 %   MCV 95.2 80.0 - 100.0 fL   MCH 29.3 26.0 - 34.0 pg   MCHC 30.8 30.0 - 36.0 g/dL   RDW 15.4 11.5 - 15.5 %   Platelets 343 150 - 400 K/uL   nRBC 0.0 0.0 - 0.2 %    Comment: Performed at [facility], [address] [place], [place] [address]  Troponin I - ONCE - STAT     Status: Abnormal   Collection Time: 01/13/18  4:55 PM  Result Value Ref Range   Troponin I 0.03 (HH) <0.03 ng/mL    Comment: CRITICAL RESULT CALLED TO, READ BACK BY AND VERIFIED WITH KIM GAULT 01/13/18 @ 1726  [place] Performed at [facility] [address] [place], [place] [address]   CG4 I-STAT (Lactic acid)     Status: Abnormal   Collection Time: 01/13/18  6:12 PM  Result Value Ref Range   Lactic Acid, Venous 2.83 (HH) 0.5 - 1.9 mmol/L   Comment NOTIFIED PHYSICIAN   Influenza panel by PCR (type A & B)     Status: None   Collection Time: 01/13/18  6:38 PM  Result Value Ref Range   Influenza A By PCR NEGATIVE NEGATIVE   Influenza B By PCR NEGATIVE NEGATIVE    Comment: (NOTE) The Xpert Xpress Flu assay is intended as an aid in the diagnosis of  influenza and should not be used as a sole basis for treatment.  This  assay is FDA approved for nasopharyngeal swab specimens only. Nasal  washings and aspirates are unacceptable for Xpert Xpress Flu testing. Performed at [facility], [address] [place], [place] [address]    Dg Chest 2 View  Result Date: 01/13/2018 CLINICAL DATA:  Cough EXAM: CHEST - 2 VIEW COMPARISON:  01/10/2018, 05/24/2017 FINDINGS: Partial clearing of right upper lobe infiltrate with small residual. Interval worsening of right middle and lower lobe airspace disease. Trace right pleural effusion. Stable cardiomediastinal silhouette. No pneumothorax. Moderate hiatal hernia IMPRESSION: 1. Partial but incomplete clearing of right upper lobe infiltrate. 2.  Worsening of right middle lobe and lower lobe airspace disease suspicious for pneumonia. Imaging follow-up to resolution is recommended. Electronically Signed   By: Donavan Foil M.D.   On: 01/13/2018 17:14    Pending Labs Unresulted Labs (From admission, onward)  Start     Ordered   01/13/18 2200  Lactic acid, plasma  Once,   STAT     01/13/18 1931   01/13/18 1920  Respiratory Panel by PCR  (Respiratory virus panel with precautions)  Once,   STAT     01/13/18 1920   01/13/18 1748  Blood culture (routine x 2)  BLOOD CULTURE X 2,   STAT     01/13/18 1747   Signed and Held  CBC  Tomorrow morning,   R     Signed and Held   Signed and Held  CBC  (enoxaparin (LOVENOX)    CrCl >/= 30 ml/min)  Once,   R    Comments:  Baseline for enoxaparin therapy IF NOT ALREADY DRAWN.  Notify MD if PLT < 100 K.    Signed and Held   Signed and Held  Creatinine, serum  (enoxaparin (LOVENOX)    CrCl >/= 30 ml/min)  Once,   R    Comments:  Baseline for enoxaparin therapy IF NOT ALREADY DRAWN.    Signed and Held   Signed and Held  Creatinine, serum  (enoxaparin (LOVENOX)    CrCl >/= 30 ml/min)  Weekly,   R    Comments:  while on enoxaparin therapy    Signed and Held   Signed and Held  Troponin I - Now Then Q6H  Now then every 6 hours,   R     Signed and Held   Signed and Held  Comprehensive metabolic panel  Tomorrow morning,   R     Signed and Held          Vitals/Pain Today's Vitals   01/13/18 1830 01/13/18 1900 01/13/18 1930 01/13/18 1944  BP: (!) 141/71 122/69 112/61   Pulse: 85 82 82   Resp: (!) 22 18 (!) 21   Temp:      TempSrc:      SpO2: 96% 95% 95%   Weight:      Height:      PainSc:    0-No pain    Isolation Precautions No active isolations  Medications Medications  sodium chloride 0.9 % bolus 1,000 mL (1,000 mLs Intravenous New Bag/Given 01/13/18 1940)  cefTRIAXone (ROCEPHIN) 1 g in sodium chloride 0.9 % 100 mL IVPB (0 g Intravenous Stopped 01/13/18 1843)  azithromycin  (ZITHROMAX) 500 mg in sodium chloride 0.9 % 250 mL IVPB (0 mg Intravenous Stopped 01/13/18 1937)  aspirin chewable tablet 324 mg (324 mg Oral Given 01/13/18 1810)    Mobility walker

## 2018-01-13 NOTE — H&P (Signed)
[facility] [place] NAME: Theresa Lynch    MR#:  [id number]   DATE OF BIRTH:  1929/03/24  DATE OF ADMISSION:  01/13/2018  PRIMARY CARE PHYSICIAN: Cammie Sickle, MD   REQUESTING/REFERRING PHYSICIAN: Lenise Arena   CHIEF COMPLAINT:   Chief Complaint  Patient presents with  . Pneumonia    HISTORY OF PRESENT ILLNESS:  Theresa Lynch  is a 84 y.o. female with a known history listed below presented to emergency room for evaluation of cough, congestion and no improvement in recent diagnosis of pneumonia.  Patient has 4 to 5 days of cough with congestion associated with fever and chills.  Patient had x-ray chest done at oncology office and diagnosed with pneumonia.  Patient was started on Levaquin.  Patient has no improvement.  Patient has generalized weakness and low appetite.  Denies chest pain or tightness.  No dizziness or palpitation.  No other complaints.  In emergency room patient had chest x-ray done that showed right middle and lower lobe airspace disease.  Patient also has mild elevation of troponin and lactic acid.  Patient received antibiotic treatment in the emergency room.  Hospitalist team requested for admission.  PAST MEDICAL HISTORY:   Past Medical History:  Diagnosis Date  . Arthritis    hands  . Cataract   . Claustrophobia   . Depression    with anxiety/h/o agoraphobia  . Glaucoma   . Hip fracture ([place])    left hip  . HOH (hard of hearing)   . Hyperlipidemia   . Hypertension   . Multiple myeloma ([place])   . PONV (postoperative nausea and vomiting)     PAST SURGICAL HISTORY:   Past Surgical History:  Procedure Laterality Date  . CATARACT EXTRACTION W/PHACO Right 03/15/2015   Procedure: CATARACT EXTRACTION PHACO AND INTRAOCULAR LENS PLACEMENT (IOC);  Surgeon: Ronnell Freshwater, MD;  Location: [facility];  Service: Ophthalmology;  Laterality: Right;  . HIP SURGERY    . INTRAMEDULLARY (IM)  NAIL INTERTROCHANTERIC Left 05/25/2017   Procedure: INTRAMEDULLARY (IM) NAIL INTERTROCHANTRIC;  Surgeon: Thornton Park, MD;  Location: [facility];  Service: Orthopedics;  Laterality: Left;  . ROTATOR CUFF REPAIR  1999   bilateral    SOCIAL HISTORY:   Social History   Tobacco Use  . Smoking status: Never Smoker  . Smokeless tobacco: Never Used  Substance Use Topics  . Alcohol use: Yes    Alcohol/week: 7.0 standard drinks    Types: 7 Glasses of wine per week    FAMILY HISTORY:   Family History  Problem Relation Age of Onset  . Cancer - Other Son        appendix cancer  . Prostate cancer Brother   . CAD Brother   . Kidney cancer Father   . Breast cancer Sister   . Stroke Sister     DRUG ALLERGIES:   Allergies  Allergen Reactions  . Codeine Nausea And Vomiting  . Penicillins Swelling and Other (See Comments)    Lips swell Has patient had a PCN reaction causing immediate rash, facial/tongue/throat swelling, SOB or lightheadedness with hypotension: Yes Has patient had a PCN reaction causing severe rash involving mucus membranes or skin necrosis: No Has patient had a PCN reaction that required hospitalization: No Has patient had a PCN reaction occurring within the last 10 years: No If all of the above answers are "NO", then may proceed with Cephalosporin use.     REVIEW OF  SYSTEMS:   ROS -12 point review of system reviewed positive as per HPI otherwise negative  MEDICATIONS AT HOME:   Prior to Admission medications   Medication Sig Start Date End Date Taking? Authorizing Provider  amLODipine (NORVASC) 5 MG tablet Take 1 tablet (5 mg total) by mouth daily. 06/01/17  Yes Gladstone Lighter, MD  aspirin EC 81 MG tablet Take 1 tablet (81 mg total) by mouth daily. 07/02/17  Yes Earlie Server, MD  atorvastatin (LIPITOR) 10 MG tablet Take 5 mg by mouth daily.    Yes [provider]  calcium gluconate 500 MG tablet Take 1 tablet by mouth 2 (two) times daily.   Yes  [provider]  cholecalciferol (VITAMIN D3) 25 MCG (1000 UT) tablet Take 2,000 Units by mouth 2 (two) times daily.    Yes [provider]  dexamethasone (DECADRON) 4 MG tablet TAKE ONE TABLET THREE TIMES A WEEK Patient taking differently: Take 4 mg by mouth 3 (three) times a week. Monday, Tuesday and Wednesday 08/28/17  Yes Cammie Sickle, MD  lenalidomide (REVLIMID) 5 MG capsule Take 1 capsule (5 mg total) by mouth daily. Take for 14 days on, then 7 days off. 12/26/17  Yes Cammie Sickle, MD  levofloxacin (LEVAQUIN) 250 MG tablet Take 1 tablet (250 mg total) by mouth daily. 01/10/18  Yes Jacquelin Hawking, NP  levothyroxine (SYNTHROID, LEVOTHROID) 25 MCG tablet Take 1 tablet (25 mcg total) by mouth daily before breakfast. 06/01/17  Yes Gladstone Lighter, MD  PARoxetine (PAXIL) 40 MG tablet Take 40 mg by mouth at bedtime. 09/05/12  Yes Love, Ivan Anchors, PA-C  polyethylene glycol (MIRALAX / GLYCOLAX) packet Take 17 g by mouth daily. Patient taking differently: Take 17 g by mouth daily as needed for mild constipation or moderate constipation.  06/01/17  Yes Gladstone Lighter, MD  potassium chloride (K-DUR) 10 MEQ tablet Take 1 tablet (10 mEq total) by mouth daily. 01/10/18  Yes Jacquelin Hawking, NP  traMADol (ULTRAM) 50 MG tablet Take 1 tablet (50 mg total) by mouth every 6 (six) hours as needed for moderate pain or severe pain. 05/31/17  Yes Gladstone Lighter, MD  traZODone (DESYREL) 50 MG tablet Take 25 mg by mouth at bedtime.    Yes [provider]  vitamin B-12 (CYANOCOBALAMIN) 1000 MCG tablet Take 1,000 mcg by mouth daily.   Yes [provider]  acetaminophen (TYLENOL) 325 MG tablet Take 2 tablets (650 mg total) by mouth every 6 (six) hours as needed for mild pain, moderate pain or fever. Patient not taking: Reported on 01/10/2018 05/31/17   Gladstone Lighter, MD  feeding supplement, ENSURE ENLIVE, (ENSURE ENLIVE) LIQD Take 237 mLs by mouth 2 (two)  times daily between meals. Patient not taking: Reported on 01/13/2018 05/31/17   Gladstone Lighter, MD  metoprolol tartrate (LOPRESSOR) 25 MG tablet Take 1 tablet (25 mg total) by mouth 2 (two) times daily. Patient not taking: Reported on 01/10/2018 09/05/12   Love, Ivan Anchors, PA-C      VITAL SIGNS:  Blood pressure 122/69, pulse 82, temperature 98.5 F (36.9 C), temperature source Oral, resp. rate 18, height '5\' 5"'  (1.651 m), weight 56.7 kg, SpO2 95 %.  PHYSICAL EXAMINATION:  Physical Exam  GENERAL:  84 y.o.-year-old patient lying in the bed with no acute distress.  EYES: Pupils equal, round, reactive to light and accommodation. No scleral icterus. Extraocular muscles intact.  HEENT: Head atraumatic, normocephalic. Oropharynx and nasopharynx clear.  NECK:  Supple, no jugular venous  distention. No thyroid enlargement, no tenderness.  LUNGS: Right-sided rales, few rhonchi CARDIOVASCULAR: S1, S2 normal. No murmurs, rubs, or gallops.  ABDOMEN: Soft, nontender, nondistended. Bowel sounds present. No organomegaly or mass.  EXTREMITIES: No pedal edema, cyanosis, or clubbing.  NEUROLOGIC: Cranial nerves II through XII are intact. Muscle strength 5/5 in all extremities. Sensation intact. Gait not checked.  PSYCHIATRIC: The patient is alert and oriented x 3.  SKIN: Warm, dry  LABORATORY PANEL:   CBC Recent Labs  Lab 01/13/18 1655  WBC 9.2  HGB 11.0*  HCT 35.7*  PLT 343   ------------------------------------------------------------------------------------------------------------------  Chemistries  Recent Labs  Lab 01/10/18 1028 01/13/18 1655  NA 138 137  K 3.2* 3.4*  CL 104 105  CO2 23 20*  GLUCOSE 90 98  BUN 24* 14  CREATININE 1.02* 0.92  CALCIUM 8.7* 8.4*  AST 13*  --   ALT 17  --   ALKPHOS 48  --   BILITOT 1.5*  --    ------------------------------------------------------------------------------------------------------------------  Cardiac Enzymes Recent Labs  Lab  01/13/18 1655  TROPONINI 0.03*   ------------------------------------------------------------------------------------------------------------------  RADIOLOGY:  Dg Chest 2 View  Result Date: 01/13/2018 CLINICAL DATA:  Cough EXAM: CHEST - 2 VIEW COMPARISON:  01/10/2018, 05/24/2017 FINDINGS: Partial clearing of right upper lobe infiltrate with small residual. Interval worsening of right middle and lower lobe airspace disease. Trace right pleural effusion. Stable cardiomediastinal silhouette. No pneumothorax. Moderate hiatal hernia IMPRESSION: 1. Partial but incomplete clearing of right upper lobe infiltrate. 2. Worsening of right middle lobe and lower lobe airspace disease suspicious for pneumonia. Imaging follow-up to resolution is recommended. Electronically Signed   By: Donavan Foil M.D.   On: 01/13/2018 17:14   EKG : Sinus rhythm with PACs   IMPRESSION AND PLAN:   1.  Community-acquired pneumonia: Patient started on IV Rocephin and azithromycin.  Symptomatic treatment.  Oxygen therapy.  Titrate as indicated.  2.  Mild lactic acidosis: Likely related to dehydration.  1 L fluid bolus then 100 mL/h normal saline ordered.  Recheck lactic acid in 6 hours.  3.  Positive troponin: Likely related to infection and dehydration.  Cycle cardiac enzymes.  Monitor on telemetry.  Patient without chest pain.  EKG without any ST elevation.  4.  History of multiple myeloma: Follow-up with oncology as outpatient.  Continue home medications.  5.  Chronic other medical problems: Monitor.  Continue home medication as ordered  DVT prophylaxis: Lovenox subcutaneous  Estimated length of stay more than 2 midnight  Moderate to high risk secondary to above   All the records are reviewed and case discussed with ED provider. Management plans discussed with the patient, family and they are in agreement.  CODE STATUS: DNR  TOTAL TIME TAKING CARE OF THIS PATIENT: 45 minutes.    Sedalia Muta M.D on  01/13/2018 at 7:39 PM  Between 7am to 6pm - Pager - [phone]  After 6pm go to [email or web address] - Proofreader  [facility]  Office  [phone]  CC: Primary care physician; Cammie Sickle, MD

## 2018-01-13 NOTE — ED Notes (Signed)
Admitting Provider at bedside. 

## 2018-01-13 NOTE — Telephone Encounter (Signed)
Re:  Fever 100.4  Patient seen on Thursday with a fever and a cough.  CXR revealed RUL pneumonia. Patient's daughter, Max Sane, states that she was given ciprofloxacin. Rx in chart notes Levaquin.  Patient's fever has continued to increase.  She is currently 100.4. She is not eating.  Requested patient to be brought to ER for evaluation and possible admission.   Lequita Asal, MD

## 2018-01-14 LAB — TROPONIN I
Troponin I: 0.03 ng/mL (ref <0.03)
Troponin I: 0.05 ng/mL (ref <0.03)
Troponin I: 0.03 ng/mL (ref <0.03)

## 2018-01-14 LAB — COMPREHENSIVE METABOLIC PANEL
ALT: 12 U/L (ref 0–44)
ANION GAP: 8 (ref 5–15)
AST: 11 U/L — ABNORMAL LOW (ref 15–41)
Albumin: 2.4 g/dL — ABNORMAL LOW (ref 3.5–5.0)
Alkaline Phosphatase: 44 U/L (ref 38–126)
BUN: 14 mg/dL (ref 8–23)
CO2: 19 mmol/L — ABNORMAL LOW (ref 22–32)
Calcium: 7.3 mg/dL — ABNORMAL LOW (ref 8.9–10.3)
Chloride: 111 mmol/L (ref 98–111)
Creatinine, Ser: 0.93 mg/dL (ref 0.44–1.00)
GFR calc Af Amer: >60 mL/min (ref >60)
GFR calc non Af Amer: 56 mL/min — ABNORMAL LOW (ref >60)
Glucose, Bld: 83 mg/dL (ref 70–99)
Potassium: 3.8 mmol/L (ref 3.5–5.1)
Sodium: 138 mmol/L (ref 135–145)
Total Bilirubin: 1 mg/dL (ref 0.3–1.2)
Total Protein: 6 g/dL — ABNORMAL LOW (ref 6.5–8.1)

## 2018-01-14 LAB — GLUCOSE, CAPILLARY: Glucose-Capillary: 68 mg/dL — ABNORMAL LOW (ref 70–99)

## 2018-01-14 LAB — RESPIRATORY PANEL BY PCR
ADENOVIRUS-RVPPCR: NOT DETECTED
Bordetella pertussis: NOT DETECTED
Chlamydophila pneumoniae: NOT DETECTED
Coronavirus 229E: NOT DETECTED
Coronavirus HKU1: NOT DETECTED
Coronavirus NL63: NOT DETECTED
Coronavirus OC43: NOT DETECTED
Influenza A: NOT DETECTED
Influenza B: NOT DETECTED
Metapneumovirus: NOT DETECTED
Mycoplasma pneumoniae: NOT DETECTED
Parainfluenza Virus 1: NOT DETECTED
Parainfluenza Virus 2: NOT DETECTED
Parainfluenza Virus 3: NOT DETECTED
Parainfluenza Virus 4: NOT DETECTED
Respiratory Syncytial Virus: DETECTED — AB
Rhinovirus / Enterovirus: NOT DETECTED

## 2018-01-14 MED ORDER — AZITHROMYCIN 250 MG PO TABS
250.0000 mg | ORAL_TABLET | Freq: Every day | ORAL | Status: DC
  Administered 2018-01-14: 250 mg via ORAL
  Filled 2018-01-14: qty 1

## 2018-01-14 MED ORDER — CEFUROXIME AXETIL 500 MG PO TABS
500.0000 mg | ORAL_TABLET | Freq: Two times a day (BID) | ORAL | Status: DC
  Administered 2018-01-15: 500 mg via ORAL
  Filled 2018-01-14 (×2): qty 1

## 2018-01-14 NOTE — Progress Notes (Signed)
[facility] [place] NAME: Theresa Lynch    MR#:  [id number]  DATE OF BIRTH:  1929/04/13  SUBJECTIVE:  patient came in with increasing cough weakness shortness of breath. Daughters in the room. Patient wants to go home.  REVIEW OF SYSTEMS:   Review of Systems  Constitutional: Negative for chills, fever and weight loss.  HENT: Negative for ear discharge, ear pain and nosebleeds.   Eyes: Negative for blurred vision, pain and discharge.  Respiratory: Negative for sputum production, shortness of breath, wheezing and stridor.   Cardiovascular: Negative for chest pain, palpitations, orthopnea and PND.  Gastrointestinal: Negative for abdominal pain, diarrhea, nausea and vomiting.  Genitourinary: Negative for frequency and urgency.  Musculoskeletal: Negative for back pain and joint pain.  Neurological: Negative for sensory change, speech change, focal weakness and weakness.  Psychiatric/Behavioral: Negative for depression and hallucinations. The patient is not nervous/anxious.    Tolerating Diet: some Tolerating PT:   DRUG ALLERGIES:   Allergies  Allergen Reactions  . Codeine Nausea And Vomiting  . Penicillins Swelling and Other (See Comments)    Lips swell Has patient had a PCN reaction causing immediate rash, facial/tongue/throat swelling, SOB or lightheadedness with hypotension: Yes Has patient had a PCN reaction causing severe rash involving mucus membranes or skin necrosis: No Has patient had a PCN reaction that required hospitalization: No Has patient had a PCN reaction occurring within the last 10 years: No If all of the above answers are "NO", then may proceed with Cephalosporin use.     VITALS:  Blood pressure 140/70, pulse 82, temperature 99.1 F (37.3 C), temperature source Oral, resp. rate 18, height _0  (1.651 m), weight 58.4 kg, SpO2 93 %.  PHYSICAL EXAMINATION:   Physical Exam  GENERAL:  84 y.o.-year-old patient  lying in the bed with no acute distress.  EYES: Pupils equal, round, reactive to light and accommodation. No scleral icterus. Extraocular muscles intact.  HEENT: Head atraumatic, normocephalic. Oropharynx and nasopharynx clear.  NECK:  Supple, no jugular venous distention. No thyroid enlargement, no tenderness.  LUNGS: Normal breath sounds bilaterally, no wheezing, rales, rhonchi. No use of accessory muscles of respiration.  CARDIOVASCULAR: S1, S2 normal. No murmurs, rubs, or gallops.  ABDOMEN: Soft, nontender, nondistended. Bowel sounds present. No organomegaly or mass.  EXTREMITIES: No cyanosis, clubbing or edema b/l.    NEUROLOGIC: Cranial nerves II through XII are intact. No focal Motor or sensory deficits b/l.   PSYCHIATRIC:  patient is alert and oriented x 3.  SKIN: No obvious rash, lesion, or ulcer.   LABORATORY PANEL:  CBC Recent Labs  Lab 01/13/18 2036  WBC 8.0  HGB 9.6*  HCT 30.7*  PLT 277    Chemistries  Recent Labs  Lab 01/14/18 0835  NA 138  K 3.8  CL 111  CO2 19*  GLUCOSE 83  BUN 14  CREATININE 0.93  CALCIUM 7.3*  AST 11*  ALT 12  ALKPHOS 44  BILITOT 1.0   Cardiac Enzymes Recent Labs  Lab 01/14/18 0835  TROPONINI 0.05*   RADIOLOGY:  Dg Chest 2 View  Result Date: 01/13/2018 CLINICAL DATA:  Cough EXAM: CHEST - 2 VIEW COMPARISON:  01/10/2018, 05/24/2017 FINDINGS: Partial clearing of right upper lobe infiltrate with small residual. Interval worsening of right middle and lower lobe airspace disease. Trace right pleural effusion. Stable cardiomediastinal silhouette. No pneumothorax. Moderate hiatal hernia IMPRESSION: 1. Partial but incomplete clearing of right upper lobe infiltrate. 2. Worsening of  right middle lobe and lower lobe airspace disease suspicious for pneumonia. Imaging follow-up to resolution is recommended. Electronically Signed   By: Donavan Foil M.D.   On: 01/13/2018 17:14   ASSESSMENT AND PLAN:  Theresa Lynch  is a 84 y.o. female with a  known history listed below presented to emergency room for evaluation of cough, congestion and no improvement in recent diagnosis of pneumonia.  Patient has 4 to 5 days of cough with congestion associated with fever and chills.  Patient had x-ray chest done at oncology office and diagnosed with pneumonia.  Patient was started on Levaquin.  Patient has no improvement.  Patient has generalized weakness and low appetite.     1.  Community-acquired pneumonia: Patient started on IV Rocephin and azithromycin.  Symptomatic treatment.  Oxygen therapy.  Titrate as indicated. -Respiratory panel positive for RSV  2.  Mild lactic acidosis: Likely related to dehydration.  1 L fluid bolus then 100 mL/h normal saline ordered.  Recheck lactic acid in 6 hour-- improved -discontinue IV fluids  3.  Positive troponin: Likely related to infection and dehydration.  Patient without chest pain.  EKG without any ST elevation. -No cardiac history according to the daughter -appears demand ischemia. Patient denies any chest pain  4.  History of multiple myeloma: Follow-up with oncology as outpatient.  Continue home medications.  5.  Chronic other medical problems: Monitor.  Continue home medication as ordered  6.DVT prophylaxis: Lovenox subcutaneous  out of bed to chair today. Case discussed with Care Management/Social Worker. Management plans discussed with the patient, family and they are in agreement.  CODE STATUS: DNR  DVT Prophylaxis: lovenox  TOTAL TIME TAKING CARE OF THIS PATIENT: *30* minutes.  >50% time spent on counselling and coordination of care  POSSIBLE D/C IN *1-2* DAYS, DEPENDING ON CLINICAL CONDITION.  Note: This dictation was prepared with Dragon dictation along with smaller phrase technology. Any transcriptional errors that result from this process are unintentional.  [name] M.D on 01/14/2018 at 1:51 PM  Between 7am to 6pm - Pager - [phone]  After 6pm go to [email or web address] -  password [email or web address]  Office  [phone]  CC: Primary care physician; Cammie Sickle, MDPatient ID: Unk Lightning, female   DOB: 19-Jul-1929, 84 y.o.   MRN: [id number]

## 2018-01-14 NOTE — Progress Notes (Signed)
Pt + for RSV per [facility] microbiology. Precautionary droplet precautions maintained at this time.

## 2018-01-14 NOTE — Plan of Care (Signed)
Pt received to room 2A 247, A&O x4 and denying c/o pain or SOB. Daughter at bedside. Admission assessment complete, pt oriented to room, unit routine, and POC for the evening, verbalized understanding. Pt currently resting comfortably in bed, on RA, SR on monitor. No additional needs noted at this time, call light within pt reach.

## 2018-01-14 NOTE — Progress Notes (Signed)
Family Meeting Note  Advance Directive:yes  Today a meeting took place with the pt and dter's patient comes in with outpatient failure for pneumonia. She has right upper lobe lower lobe pneumonia. Has RSV virus illness. Doing overall better however week and deconditioned. She has multiple myeloma. Code status discussed with patient and daughters. She is a DNR.   Time spent during discussion 16 minutes   , MD  

## 2018-01-14 NOTE — Care Management Note (Signed)
Case Management Note  Patient Details  Name: Theresa Lynch MRN: [id number] Date of Birth: [birth year]  Subjective/Objective:    Patient admitted with pneumonia, daughter and daughter in law at the bedside.  Patient lives in [place] with her son and his wife.  Daughter reports that all her needs are taken care of and she needs no assistance.  They have had home health in the past with [facility] and are okay with having home health again.  Patient has a rolling walker that she uses around the house.  Family takes care of transportation.  PCP is Dr. Netty Starring and Oncology MD is Dr. Rogue Bussing.  [facility]  RNCM will cont to follow to assess for home health needs at discharge.  Doran Clay RN BSN [phone]              Action/Plan:   Expected Discharge Date:                  Expected Discharge Plan:  Home/Self Care  In-House Referral:     Discharge planning Services  CM Consult  Post Acute Care Choice:    Choice offered to:     DME Arranged:    DME Agency:     HH Arranged:    HH Agency:     Status of Service:  In process, will continue to follow  If discussed at Long Length of Stay Meetings, dates discussed:    Additional Comments:  Shelbie Hutching, RN 01/14/2018, 11:33 AM

## 2018-01-15 LAB — BASIC METABOLIC PANEL
Anion gap: 7 (ref 5–15)
BUN: 14 mg/dL (ref 8–23)
CO2: 20 mmol/L — ABNORMAL LOW (ref 22–32)
Calcium: 7.6 mg/dL — ABNORMAL LOW (ref 8.9–10.3)
Chloride: 111 mmol/L (ref 98–111)
Creatinine, Ser: 0.98 mg/dL (ref 0.44–1.00)
GFR calc Af Amer: >60 mL/min (ref >60)
GFR, EST NON AFRICAN AMERICAN: 52 mL/min — AB (ref >60)
Glucose, Bld: 111 mg/dL — ABNORMAL HIGH (ref 70–99)
Potassium: 3.5 mmol/L (ref 3.5–5.1)
Sodium: 138 mmol/L (ref 135–145)

## 2018-01-15 LAB — CULTURE, BLOOD (ROUTINE X 2)
Culture: NO GROWTH
Culture: NO GROWTH
SPECIAL REQUESTS: ADEQUATE
Special Requests: ADEQUATE

## 2018-01-15 LAB — MAGNESIUM: Magnesium: 1.8 mg/dL (ref 1.7–2.4)

## 2018-01-15 MED ORDER — AZITHROMYCIN 250 MG PO TABS: ORAL_TABLET | ORAL | 0 refills | Status: DC

## 2018-01-15 MED ORDER — CEFUROXIME AXETIL 500 MG PO TABS: 500.0000 mg | ORAL_TABLET | Freq: Two times a day (BID) | ORAL | 0 refills | Status: DC

## 2018-01-15 MED ORDER — CALCIUM CARBONATE ANTACID 500 MG PO CHEW
0.5000 | CHEWABLE_TABLET | Freq: Two times a day (BID) | ORAL | Status: DC
  Administered 2018-01-15: 100 mg via ORAL
  Filled 2018-01-15: qty 1

## 2018-01-15 NOTE — Plan of Care (Signed)
  Problem: Health Behavior/Discharge Planning: Goal: Ability to manage health-related needs will improve Outcome: Progressing   Problem: Activity: Goal: Risk for activity intolerance will decrease Outcome: Progressing   Problem: Safety: Goal: Ability to remain free from injury will improve Outcome: Progressing   

## 2018-01-15 NOTE — Discharge Summary (Signed)
Theresa Lynch at [facility] NAME: Theresa Lynch    MR#:  [id number]  DATE OF BIRTH:  1929/10/04  DATE OF ADMISSION:  01/13/2018 ADMITTING PHYSICIAN: Theresa Muta, MD  DATE OF DISCHARGE: 01/15/2018  PRIMARY CARE PHYSICIAN: Theresa Body, MD    ADMISSION DIAGNOSIS:  Elevated troponin I level [R79.89] Community acquired pneumonia of right middle lobe of lung ([place]) [J18.1]  DISCHARGE DIAGNOSIS:  right lower lobe community acquired pneumonia  SECONDARY DIAGNOSIS:   Past Medical History:  Diagnosis Date  . Arthritis    hands  . Cataract   . Claustrophobia   . Depression    with anxiety/h/o agoraphobia  . Glaucoma   . Hip fracture ([place])    left hip  . HOH (hard of hearing)   . Hyperlipidemia   . Hypertension   . Multiple myeloma ([place])   . PONV (postoperative nausea and vomiting)     HOSPITAL COURSE:   Theresa Lynch a84 y.o.femalewith a known history listed below presented to emergency room for evaluation of cough, congestion and no improvement in recent diagnosis of pneumonia.Patient has 4 to 5 days of cough with congestion associated with fever and chills. Patient had x-ray chest done at oncology office and diagnosed with pneumonia. Patient was started on Levaquin. Patient has no improvement. Patient has generalized weakness and low appetite.    1.Right lower lobecmmunity-acquired pneumonia:Patient started on IV Rocephin and azithromycin. Symptomatic treatment. Oxygen therapy-- wean to room air 95%..  -Respiratory panel positive for RSV -feels weak and drained. -Change to oral antibiotic. -Blood culture negative. No fever.  2.Mild lactic acidosis:Likely related to dehydration. 1 L fluid bolus then 100 mL/h normal saline ordered. Recheck lactic acid in 6 hour-- improved -discontinue IV fluids  3.Positive troponin:Likely related to infection and dehydration. Patient without chest pain. EKG  without any ST elevation. -No cardiac history according to the daughter -appears demand ischemia. Patient denies any chest pain  4.History of multiple myeloma:Follow-up with oncology as outpatient. Continue home medications.  5.Chronic other medical problems:Monitor. Continue home medication as ordered  6.DVT prophylaxis:Lovenox subcutaneous  this with patient and son. Asked if she would want physical therapy to walk around she said no. Son did not seem to think patient will work with therapy. She is ambulating with her home walker according to the nurse.  Will discharge patient to home later. Discussed with son for patient to have fluids and symptomatic management along with cough medicine which she already has it at home and finish of course of antibiotic and follow-up with PCP as outpatient.   CONSULTS OBTAINED:    DRUG ALLERGIES:   Allergies  Allergen Reactions  . Codeine Nausea And Vomiting  . Penicillins Swelling and Other (See Comments)    Lips swell Has patient had a PCN reaction causing immediate rash, facial/tongue/throat swelling, SOB or lightheadedness with hypotension: Yes Has patient had a PCN reaction causing severe rash involving mucus membranes or skin necrosis: No Has patient had a PCN reaction that required hospitalization: No Has patient had a PCN reaction occurring within the last 10 years: No If all of the above answers are "NO", then may proceed with Cephalosporin use.     DISCHARGE MEDICATIONS:   Allergies as of 01/15/2018      Reactions   Codeine Nausea And Vomiting   Penicillins Swelling, Other (See Comments)   Lips swell Has patient had a PCN reaction causing immediate rash, facial/tongue/throat swelling, SOB or lightheadedness with hypotension: Yes Has  patient had a PCN reaction causing severe rash involving mucus membranes or skin necrosis: No Has patient had a PCN reaction that required hospitalization: No Has patient had a PCN  reaction occurring within the last 10 years: No If all of the above answers are "NO", then may proceed with Cephalosporin use.      Medication List    STOP taking these medications   acetaminophen 325 MG tablet Commonly known as:  TYLENOL   feeding supplement (ENSURE ENLIVE) Liqd   levofloxacin 250 MG tablet Commonly known as:  LEVAQUIN   metoprolol tartrate 25 MG tablet Commonly known as:  LOPRESSOR     TAKE these medications   amLODipine 5 MG tablet Commonly known as:  NORVASC Take 1 tablet (5 mg total) by mouth daily.   aspirin EC 81 MG tablet Take 1 tablet (81 mg total) by mouth daily.   atorvastatin 10 MG tablet Commonly known as:  LIPITOR Take 5 mg by mouth daily.   azithromycin 250 MG tablet Commonly known as:  ZITHROMAX Take daily as needed   calcium gluconate 500 MG tablet Take 1 tablet by mouth 2 (two) times daily.   cefUROXime 500 MG tablet Commonly known as:  CEFTIN Take 1 tablet (500 mg total) by mouth 2 (two) times daily with a meal.   cholecalciferol 25 MCG (1000 UT) tablet Commonly known as:  VITAMIN D3 Take 2,000 Units by mouth 2 (two) times daily.   dexamethasone 4 MG tablet Commonly known as:  DECADRON TAKE ONE TABLET THREE TIMES A WEEK What changed:  See the new instructions.   lenalidomide 5 MG capsule Commonly known as:  REVLIMID Take 1 capsule (5 mg total) by mouth daily. Take for 14 days on, then 7 days off.   levothyroxine 25 MCG tablet Commonly known as:  SYNTHROID, LEVOTHROID Take 1 tablet (25 mcg total) by mouth daily before breakfast.   PARoxetine 40 MG tablet Commonly known as:  PAXIL Take 40 mg by mouth at bedtime.   polyethylene glycol packet Commonly known as:  MIRALAX / GLYCOLAX Take 17 g by mouth daily. What changed:    when to take this  reasons to take this   potassium chloride 10 MEQ tablet Commonly known as:  K-DUR Take 1 tablet (10 mEq total) by mouth daily.   traMADol 50 MG tablet Commonly known as:   ULTRAM Take 1 tablet (50 mg total) by mouth every 6 (six) hours as needed for moderate pain or severe pain.   traZODone 50 MG tablet Commonly known as:  DESYREL Take 25 mg by mouth at bedtime.   vitamin B-12 1000 MCG tablet Commonly known as:  CYANOCOBALAMIN Take 1,000 mcg by mouth daily.       If you experience worsening of your admission symptoms, develop shortness of breath, life threatening emergency, suicidal or homicidal thoughts you must seek medical attention immediately by calling 911 or calling your MD immediately  if symptoms less severe.  You Must read complete instructions/literature along with all the possible adverse reactions/side effects for all the Medicines you take and that have been prescribed to you. Take any new Medicines after you have completely understood and accept all the possible adverse reactions/side effects.   Please note  You were cared for by a hospitalist during your hospital stay. If you have any questions about your discharge medications or the care you received while you were in the hospital after you are discharged, you can call the unit and asked to  speak with the hospitalist on call if the hospitalist that took care of you is not available. Once you are discharged, your primary care physician will handle any further medical issues. Please note that NO REFILLS for any discharge medications will be authorized once you are discharged, as it is imperative that you return to your primary care physician (or establish a relationship with a primary care physician if you do not have one) for your aftercare needs so that they can reassess your need for medications and monitor your lab values. Today   SUBJECTIVE   Feels weak drained. I I drink orange juice this morning. Coughing up sputum fever. Son in the room VITAL SIGNS:  Blood pressure (!) 169/83, pulse 86, temperature (!) 97.5 F (36.4 C), temperature source Oral, resp. rate 18, height '5\' 5"'  (1.651 m),  weight 55.8 kg, SpO2 95 %.  I/O:    Intake/Output Summary (Last 24 hours) at 01/15/2018 1120 Last data filed at 01/15/2018 0700 Gross per 24 hour  Intake 589.27 ml  Output 150 ml  Net 439.27 ml    PHYSICAL EXAMINATION:  GENERAL:  84 y.o.-year-old patient lying in the bed with no acute distress.  EYES: Pupils equal, round, reactive to light and accommodation. No scleral icterus. Extraocular muscles intact.  HEENT: Head atraumatic, normocephalic. Oropharynx and nasopharynx clear.  NECK:  Supple, no jugular venous distention. No thyroid enlargement, no tenderness.  LUNGS: decreased breath sounds bilaterally, no wheezing, rales,rhonchi or crepitation. No use of accessory muscles of respiration. No respiratory distress CARDIOVASCULAR: S1, S2 normal. No murmurs, rubs, or gallops.  ABDOMEN: Soft, non-tender, non-distended. Bowel sounds present. No organomegaly or mass.  EXTREMITIES: No pedal edema, cyanosis, or clubbing.  NEUROLOGIC: Cranial nerves II through XII are intact. Muscle strength 5/5 in all extremities. Sensation intact. Gait not checked.  PSYCHIATRIC: The patient is alert and oriented x 3.  SKIN: No obvious rash, lesion, or ulcer.   DATA REVIEW:   CBC  Recent Labs  Lab 01/13/18 2036  WBC 8.0  HGB 9.6*  HCT 30.7*  PLT 277    Chemistries  Recent Labs  Lab 01/14/18 0835 01/15/18 0049  NA 138 138  K 3.8 3.5  CL 111 111  CO2 19* 20*  GLUCOSE 83 111*  BUN 14 14  CREATININE 0.93 0.98  CALCIUM 7.3* 7.6*  MG  --  1.8  AST 11*  --   ALT 12  --   ALKPHOS 44  --   BILITOT 1.0  --     Microbiology Results   Recent Results (from the past 240 hour(s))  Urine Culture     Status: Abnormal   Collection Time: 01/10/18 11:17 AM  Result Value Ref Range Status   Specimen Description   Final    URINE, CLEAN CATCH Performed at [facility], [address], [place], [place] [address]    Special Requests   Final    NONE Performed at [facility], [address] [place], [place] [address]    Culture MULTIPLE SPECIES PRESENT, SUGGEST RECOLLECTION (A)  Final   Report Status 01/11/2018 FINAL  Final  Culture, blood (routine x 2)     Status: None   Collection Time: 01/10/18  2:01 PM  Result Value Ref Range Status   Specimen Description   Final    BLOOD RIGHT ARM Performed at [facility], [address], [place],  [address]    Special Requests   Final    BOTTLES DRAWN  AEROBIC AND ANAEROBIC Blood Culture adequate volume Performed at [facility], [address] [place], [place] [address]    Culture   Final    NO GROWTH 5 DAYS Performed at [facility], [address] [place], [place] [address]    Report Status 01/15/2018 FINAL  Final  Culture, blood (routine x 2)     Status: None   Collection Time: 01/10/18  2:15 PM  Result Value Ref Range Status   Specimen Description   Final    BLOOD LEFT ARM Performed at [facility], [address] [place], [place] [address]    Special Requests   Final    BOTTLES DRAWN AEROBIC AND ANAEROBIC Blood Culture adequate volume Performed at [facility], [address] [place], [place] [address]    Culture   Final    NO GROWTH 5 DAYS Performed at [facility], [address] [place], [place] [address]    Report Status 01/15/2018 FINAL  Final  Blood culture (routine x 2)     Status: None (Preliminary result)   Collection Time: 01/13/18  4:55 PM  Result Value Ref Range Status   Specimen Description BLOOD LAC  Final   Special Requests   Final    BOTTLES DRAWN AEROBIC AND ANAEROBIC Blood Culture adequate volume   Culture   Final    NO GROWTH 2 DAYS Performed at [facility], [address], [place], [place] [address]    Report Status PENDING  Incomplete  Blood culture (routine x 2)     Status: None (Preliminary result)   Collection Time: 01/13/18  6:10 PM  Result Value Ref Range Status   Specimen Description BLOOD RFA  Final    Special Requests   Final    BOTTLES DRAWN AEROBIC AND ANAEROBIC Blood Culture adequate volume   Culture   Final    NO GROWTH 2 DAYS Performed at [facility], [address] [place], [place] [address]    Report Status PENDING  Incomplete  Respiratory Panel by PCR     Status: Abnormal   Collection Time: 01/13/18  8:29 PM  Result Value Ref Range Status   Adenovirus NOT DETECTED NOT DETECTED Final   Coronavirus 229E NOT DETECTED NOT DETECTED Final   Coronavirus HKU1 NOT DETECTED NOT DETECTED Final   Coronavirus NL63 NOT DETECTED NOT DETECTED Final   Coronavirus OC43 NOT DETECTED NOT DETECTED Final   Metapneumovirus NOT DETECTED NOT DETECTED Final   Rhinovirus / Enterovirus NOT DETECTED NOT DETECTED Final   Influenza A NOT DETECTED NOT DETECTED Final   Influenza B NOT DETECTED NOT DETECTED Final   Parainfluenza Virus 1 NOT DETECTED NOT DETECTED Final   Parainfluenza Virus 2 NOT DETECTED NOT DETECTED Final   Parainfluenza Virus 3 NOT DETECTED NOT DETECTED Final   Parainfluenza Virus 4 NOT DETECTED NOT DETECTED Final   Respiratory Syncytial Virus DETECTED (A) NOT DETECTED Final    Comment: CRITICAL RESULT CALLED TO, READ BACK BY AND VERIFIED WITH: S. WORTH,RN 0200 01/14/2018 T. TYSOR    Bordetella pertussis NOT DETECTED NOT DETECTED Final   Chlamydophila pneumoniae NOT DETECTED NOT DETECTED Final   Mycoplasma pneumoniae NOT DETECTED NOT DETECTED Final    Comment: Performed at [facility], [place] [address] [place],  [address]    RADIOLOGY:  Dg Chest 2 View  Result Date: 01/13/2018 CLINICAL DATA:  Cough EXAM: CHEST - 2 VIEW COMPARISON:  01/10/2018, 05/24/2017 FINDINGS: Partial clearing of right  upper lobe infiltrate with small residual. Interval worsening of right middle and lower lobe airspace disease. Trace right pleural effusion. Stable cardiomediastinal silhouette. No pneumothorax. Moderate hiatal hernia IMPRESSION: 1. Partial but incomplete clearing of  right upper lobe infiltrate. 2. Worsening of right middle lobe and lower lobe airspace disease suspicious for pneumonia. Imaging follow-up to resolution is recommended. Electronically Signed   By: Donavan Foil M.D.   On: 01/13/2018 17:14     Management plans discussed with the patient, family and they are in agreement.  CODE STATUS:     Code Status Orders  (From admission, onward)         Start     Ordered   01/13/18 2021  Do not attempt resuscitation (DNR)  Continuous    Question Answer Comment  In the event of cardiac or respiratory ARREST Do not call a "code blue"   In the event of cardiac or respiratory ARREST Do not perform Intubation, CPR, defibrillation or ACLS   In the event of cardiac or respiratory ARREST Use medication by any route, position, wound care, and other measures to relive pain and suffering. May use oxygen, suction and manual treatment of airway obstruction as needed for comfort.      01/13/18 2021        Code Status History    Date Active Date Inactive Code Status Order ID Comments User Context   01/13/2018 2021 01/13/2018 2021 DNR [id number]  Theresa Muta, MD Inpatient   05/25/2017 1245 05/31/2017 1950 DNR [id number]  [name], MD Inpatient   05/25/2017 0157 05/25/2017 1245 Full Code [id number]  Harrie Foreman, MD Inpatient    Advance Directive Documentation     Most Recent Value  Type of Advance Directive  Healthcare Power of [place], Living will  Pre-existing out of facility DNR order (yellow form or pink MOST form)  -  "MOST" Form in Place?  -      TOTAL TIME TAKING CARE OF THIS PATIENT: *40* minutes.    [name] M.D on 01/15/2018 at 11:20 AM  Between 7am to 6pm - Pager - [phone] After 6pm go to [email or web address] - password [email or web address] [facility]  Office  [phone]  CC: Primary care physician; Theresa Body, MD

## 2018-01-15 NOTE — Discharge Instructions (Signed)
Community-Acquired Pneumonia, Adult  Pneumonia is an infection of the lungs. It causes swelling in the airways of the lungs. Mucus and fluid may also build up inside the airways.  One type of pneumonia can happen while a person is in a hospital. A different type can happen when a person is not in a hospital (community-acquired pneumonia).   What are the causes?    This condition is caused by germs (viruses, bacteria, or fungi). Some types of germs can be passed from one person to another. This can happen when you breathe in droplets from the cough or sneeze of an infected person.  What increases the risk?  You are more likely to develop this condition if you:   Have a long-term (chronic) disease, such as:  ? Chronic obstructive pulmonary disease (COPD).  ? Asthma.  ? Cystic fibrosis.  ? Congestive heart failure.  ? Diabetes.  ? Kidney disease.   Have HIV.   Have sickle cell disease.   Have had your spleen removed.   Do not take good care of your teeth and mouth (poor dental hygiene).   Have a medical condition that increases the risk of breathing in droplets from your own mouth and nose.   Have a weakened body defense system (immune system).   Are a smoker.   Travel to areas where the germs that cause this illness are common.   Are around certain animals or the places they live.  What are the signs or symptoms?   A dry cough.   A wet (productive) cough.   Fever.   Sweating.   Chest pain. This often happens when breathing deeply or coughing.   Fast breathing or trouble breathing.   Shortness of breath.   Shaking chills.   Feeling tired (fatigue).   Muscle aches.  How is this treated?  Treatment for this condition depends on many things. Most adults can be treated at home. In some cases, treatment must happen in a hospital. Treatment may include:   Medicines given by mouth or through an IV tube.   Being given extra oxygen.   Respiratory therapy.  In rare cases, treatment for very bad pneumonia  may include:   Using a machine to help you breathe.   Having a procedure to remove fluid from around your lungs.  Follow these instructions at home:  Medicines   Take over-the-counter and prescription medicines only as told by your doctor.  ? Only take cough medicine if you are losing sleep.   If you were prescribed an antibiotic medicine, take it as told by your doctor. Do not stop taking the antibiotic even if you start to feel better.  General instructions     Sleep with your head and neck raised (elevated). You can do this by sleeping in a recliner or by putting a few pillows under your head.   Rest as needed. Get at least 8 hours of sleep each night.   Drink enough water to keep your pee (urine) pale yellow.   Eat a healthy diet that includes plenty of vegetables, fruits, whole grains, low-fat dairy products, and lean protein.   Do not use any products that contain nicotine or tobacco. These include cigarettes, e-cigarettes, and chewing tobacco. If you need help quitting, ask your doctor.   Keep all follow-up visits as told by your doctor. This is important.  How is this prevented?  A shot (vaccine) can help prevent pneumonia. Shots are often suggested for:   People   older than 82 years of age.   People older than 82 years of age who:  ? Are having cancer treatment.  ? Have long-term (chronic) lung disease.  ? Have problems with their body's defense system.  You may also prevent pneumonia if you take these actions:   Get the flu (influenza) shot every year.   Go to the dentist as often as told.   Wash your hands often. If you cannot use soap and water, use hand sanitizer.  Contact a doctor if:   You have a fever.   You lose sleep because your cough medicine does not help.  Get help right away if:   You are short of breath and it gets worse.   You have more chest pain.   Your sickness gets worse. This is very serious if:  ? You are an older adult.  ? Your body's defense system is weak.   You  cough up blood.  Summary   Pneumonia is an infection of the lungs.   Most adults can be treated at home. Some will need treatment in a hospital.   Drink enough water to keep your pee pale yellow.   Get at least 8 hours of sleep each night.  This information is not intended to replace advice given to you by your health care provider. Make sure you discuss any questions you have with your health care provider.  Document Released: 06/28/2007 Document Revised: 09/06/2017 Document Reviewed: 09/06/2017  Elsevier Interactive Patient Education  2019 [employer]

## 2018-01-15 NOTE — Progress Notes (Addendum)
CCMD called and reported that pt have 7 beats of V-tach. Pt was asleep. VSS. Notify prime. Will continue to monitor.  Update 0017: Dr. Jerelyn Charles ordered to check pt BMP and Mag STAT. EKG today at 0500. Will continue to monitor.

## 2018-01-18 ENCOUNTER — Other Ambulatory Visit: Payer: Self-pay | Admitting: *Deleted

## 2018-01-18 DIAGNOSIS — C9002 Multiple myeloma in relapse: Secondary | ICD-10-CM

## 2018-01-18 LAB — CULTURE, BLOOD (ROUTINE X 2)
Culture: NO GROWTH
Culture: NO GROWTH
Special Requests: ADEQUATE
Special Requests: ADEQUATE

## 2018-01-21 ENCOUNTER — Telehealth: Payer: Self-pay | Admitting: *Deleted

## 2018-01-21 ENCOUNTER — Inpatient Hospital Stay: Payer: PPO | Admitting: Internal Medicine

## 2018-01-21 ENCOUNTER — Other Ambulatory Visit: Payer: Self-pay | Admitting: *Deleted

## 2018-01-21 NOTE — Assessment & Plan Note (Signed)
#  Multiple myeloma -IgA kappa; symptomatic.  Stage II.  ON dexamethasone 4 mg 3 times a week;Revlimid 5 mg 2 weeks on 1 week off.  M protein decreasing-0.3gm/dl; OCT 2019 M=1.3;K/l= ratio=29; STABLE.   # Continue Revlimid dexamethasone; start cycle today.   #Discussed regarding steroid intolerance [mood swings/mild swelling of the legs]-discussed regarding cutting down the dose of dexamethasone to 4 mg a week.  However patient/family wanted to keep dexamethasone at the current dose of 12 mg/week.  # CKD- stage III- 1.3 ; [BL ~1.0]; STABLE.   # Anemia hemoglobin- sec to MM-11.3; STABLE.   # Right arm bruise- recommend ice packing/tylenol  # Hypercalcemia/MM- improved on Zometa 3mg q 6 weeks;  # DISPOSITION: # Zometa today.  # follow up in 6 weeks/labs- cbc/Cmp/zometa- MM panel;K-light chain- Dr.B 

## 2018-01-21 NOTE — Telephone Encounter (Signed)
Theresa Lynch's daughter called and "she will not be bringing her in today. She is still to weak."

## 2018-01-21 NOTE — Progress Notes (Unsigned)
[place] OFFICE PROGRESS NOTE  Patient Care Team: [name], MD as PCP - General (Family Medicine)  Cancer Staging No matching staging information was found for the patient.   Oncology History   # MAY 2019- MULTIPLE MYELOMA [No BMBx]; IgA-K [baseline- 3.4 gm/dl; ]  # Hypercalcemia- s/p Zometa 62m IVP [May 2019]  # Mod dementia; s/p Left hip Fx [may 2019]  ----------------------------------------------------------------------  DIAGNOSIS: [MAY 2019]- ACTIVE MULTIPLE MYELOMA  STAGE:  II  ; GOALS: PALLIATIVE  CURRENT/MOST RECENT THERAPY: Dex 436m3/w; Rev 26m48mw-On/1w-OFF      Multiple myeloma in relapse ([facility]    INTERVAL HISTORY:  Theresa Lynch 20o.  female pleasant patient above history of dementia; also history of multiple myeloma currently on Revlimid dexamethasone is here for follow-up.  As per the family patient is having " mood swings"; otherwise appetite is good.  No weight loss.  Complains of mild swelling in the legs.  No skin rash.  No falls.  Patient had a bruise of her right arm while picking her dog.   Review of Systems  Constitutional: Negative for chills, diaphoresis, fever, malaise/fatigue and weight loss.  HENT: Negative for nosebleeds and sore throat.   Eyes: Negative for double vision.  Respiratory: Negative for cough, hemoptysis, sputum production, shortness of breath and wheezing.   Cardiovascular: Positive for leg swelling. Negative for chest pain, palpitations and orthopnea.  Gastrointestinal: Negative for abdominal pain, blood in stool, constipation, diarrhea, heartburn, melena, nausea and vomiting.  Genitourinary: Negative for dysuria, frequency and urgency.  Musculoskeletal: Negative for back pain and joint pain.  Neurological: Negative for dizziness, tingling, focal weakness, weakness and headaches.  Endo/Heme/Allergies: Does not bruise/bleed easily.  Psychiatric/Behavioral: Positive for memory loss.  Negative for depression. The patient is not nervous/anxious and does not have insomnia.       PAST MEDICAL HISTORY :  Past Medical History:  Diagnosis Date  . Arthritis    hands  . Cataract   . Claustrophobia   . Depression    with anxiety/h/o agoraphobia  . Glaucoma   . [place] ([facility]  left hip  . HOH (hard of hearing)   . Hyperlipidemia   . Hypertension   . Multiple myeloma ([facility] . PONV (postoperative nausea and vomiting)     PAST SURGICAL HISTORY :   Past Surgical History:  Procedure Laterality Date  . CATARACT EXTRACTION W/PHACO Right 03/15/2015   Procedure: CATARACT EXTRACTION PHACO AND INTRAOCULAR LENS PLACEMENT (IOC);  Surgeon: AniRonnell FreshwaterD;  [facility];  Laterality: Right;  . HIP SURGERY    . INTRAMEDULLARY (IM) NAIL INTERTROCHANTERIC Left 05/25/2017   Procedure: INTRAMEDULLARY (IM) NAIL INTERTROCHANTRIC;  Surgeon: KraThornton ParkD;  Location: [facility] ORS;  Service: Orthopedics;  Laterality: Left;  . ROTATOR CUFF REPAIR  1999   bilateral    FAMILY HISTORY :   Family History  Problem Relation Age of Onset  . Cancer - Other Son        appendix cancer  . Prostate cancer Brother   . CAD Brother   . Kidney cancer Father   . Breast cancer Sister   . Stroke Sister     SOCIAL HISTORY:   Social History   Tobacco Use  . Smoking status: Never Smoker  . Smokeless tobacco: Never Used  Substance Use Topics  . Alcohol use: Yes    Alcohol/week: 7.0 standard drinks    Types:  7 Glasses of wine per week  . Drug use: No    ALLERGIES:  is allergic to codeine and penicillins.  MEDICATIONS:  Current Outpatient Medications  Medication Sig Dispense Refill  . amLODipine (NORVASC) 5 MG tablet Take 1 tablet (5 mg total) by mouth daily. 30 tablet 0  . aspirin EC 81 MG tablet Take 1 tablet (81 mg total) by mouth daily. 30 tablet 1  . atorvastatin (LIPITOR) 10 MG tablet Take 5 mg by mouth daily.     Marland Kitchen  azithromycin (ZITHROMAX) 250 MG tablet Take daily as needed 4 each 0  . calcium gluconate 500 MG tablet Take 1 tablet by mouth 2 (two) times daily.    . cefUROXime (CEFTIN) 500 MG tablet Take 1 tablet (500 mg total) by mouth 2 (two) times daily with a meal. 10 tablet 0  . cholecalciferol (VITAMIN D3) 25 MCG (1000 UT) tablet Take 2,000 Units by mouth 2 (two) times daily.     Marland Kitchen dexamethasone (DECADRON) 4 MG tablet TAKE ONE TABLET THREE TIMES A WEEK (Patient taking differently: Take 4 mg by mouth 3 (three) times a week. Monday, Tuesday and Wednesday) 12 tablet 4  . lenalidomide (REVLIMID) 5 MG capsule Take 1 capsule (5 mg total) by mouth daily. Take for 14 days on, then 7 days off. 14 capsule 0  . levothyroxine (SYNTHROID, LEVOTHROID) 25 MCG tablet Take 1 tablet (25 mcg total) by mouth daily before breakfast. 30 tablet 1  . PARoxetine (PAXIL) 40 MG tablet Take 40 mg by mouth at bedtime.    . polyethylene glycol (MIRALAX / GLYCOLAX) packet Take 17 g by mouth daily. (Patient taking differently: Take 17 g by mouth daily as needed for mild constipation or moderate constipation. ) 14 each 0  . potassium chloride (K-DUR) 10 MEQ tablet Take 1 tablet (10 mEq total) by mouth daily. 10 tablet 0  . traMADol (ULTRAM) 50 MG tablet Take 1 tablet (50 mg total) by mouth every 6 (six) hours as needed for moderate pain or severe pain. 30 tablet 0  . traZODone (DESYREL) 50 MG tablet Take 25 mg by mouth at bedtime.     . vitamin B-12 (CYANOCOBALAMIN) 1000 MCG tablet Take 1,000 mcg by mouth daily.     No current facility-administered medications for this visit.     PHYSICAL EXAMINATION: ECOG PERFORMANCE STATUS: 1 - Symptomatic but completely ambulatory  There were no vitals taken for this visit.  There were no vitals filed for this visit.  Physical Exam  Constitutional:  Theresa Lynch Caucasian female patient.  Resting in a wheelchair.  She is accompanied by daughter.  HENT:  Head: Normocephalic and  atraumatic.  Mouth/Throat: Oropharynx is clear and moist. No oropharyngeal exudate.  Eyes: Pupils are equal, round, and reactive to light.  Neck: Normal range of motion. Neck supple.  Cardiovascular: Normal rate and regular rhythm.  Pulmonary/Chest: No respiratory distress. She has no wheezes.  Abdominal: Soft. Bowel sounds are normal. She exhibits no distension and no mass. There is no abdominal tenderness. There is no rebound and no guarding.  Musculoskeletal: Normal range of motion.        General: No tenderness or edema.  Neurological: She is alert.  Oriented 2-3.  Skin: Skin is warm.  Right upper extremity focal swelling/erythema.  No warmth.  No signs of infection.  Psychiatric: Affect normal.       LABORATORY DATA:  I have reviewed the data as listed    Component Value Date/Time   NA  138 01/15/2018 0049   K 3.5 01/15/2018 0049   CL 111 01/15/2018 0049   CO2 20 (L) 01/15/2018 0049   GLUCOSE 111 (H) 01/15/2018 0049   BUN 14 01/15/2018 0049   CREATININE 0.98 01/15/2018 0049   CALCIUM 7.6 (L) 01/15/2018 0049   CALCIUM 10.1 05/26/2017 0419   PROT 6.0 (L) 01/14/2018 0835   ALBUMIN 2.4 (L) 01/14/2018 0835   AST 11 (L) 01/14/2018 0835   ALT 12 01/14/2018 0835   ALKPHOS 44 01/14/2018 0835   BILITOT 1.0 01/14/2018 0835   GFRNONAA 52 (L) 01/15/2018 0049   GFRAA >60 01/15/2018 0049    No results found for: SPEP, UPEP  Lab Results  Component Value Date   WBC 8.0 01/13/2018   NEUTROABS 12.8 (H) 01/10/2018   HGB 9.6 (L) 01/13/2018   HCT 30.7 (L) 01/13/2018   MCV 94.5 01/13/2018   PLT 277 01/13/2018      Chemistry      Component Value Date/Time   NA 138 01/15/2018 0049   K 3.5 01/15/2018 0049   CL 111 01/15/2018 0049   CO2 20 (L) 01/15/2018 0049   BUN 14 01/15/2018 0049   CREATININE 0.98 01/15/2018 0049      Component Value Date/Time   CALCIUM 7.6 (L) 01/15/2018 0049   CALCIUM 10.1 05/26/2017 0419   ALKPHOS 44 01/14/2018 0835   AST 11 (L) 01/14/2018 0835    ALT 12 01/14/2018 0835   BILITOT 1.0 01/14/2018 0835       RADIOGRAPHIC STUDIES: I have personally reviewed the radiological images as listed and agreed with the findings in the report. No results found.   ASSESSMENT & PLAN:  No problem-specific Assessment & Plan notes found for this encounter.   No orders of the defined types were placed in this encounter.  All questions were answered. The patient knows to call the clinic with any problems, questions or concerns.      Cammie Sickle, MD 01/21/2018 8:42 AM

## 2018-01-28 ENCOUNTER — Other Ambulatory Visit: Payer: Self-pay | Admitting: *Deleted

## 2018-01-28 MED ORDER — DEXAMETHASONE 4 MG PO TABS: ORAL_TABLET | ORAL | 4 refills | Status: DC

## 2018-02-06 ENCOUNTER — Other Ambulatory Visit: Payer: Self-pay | Admitting: Internal Medicine

## 2018-02-06 DIAGNOSIS — C9002 Multiple myeloma in relapse: Secondary | ICD-10-CM

## 2018-02-08 ENCOUNTER — Inpatient Hospital Stay: Payer: PPO | Admitting: Internal Medicine

## 2018-02-08 ENCOUNTER — Inpatient Hospital Stay: Payer: PPO

## 2018-02-08 ENCOUNTER — Other Ambulatory Visit: Payer: Self-pay

## 2018-02-08 ENCOUNTER — Inpatient Hospital Stay: Payer: PPO | Attending: Internal Medicine

## 2018-02-08 ENCOUNTER — Encounter: Payer: Self-pay | Admitting: Internal Medicine

## 2018-02-08 VITALS — BP 126/82 | HR 118 | Temp 98.5°F | Resp 12 | Ht 66.0 in | Wt 124.6 lb

## 2018-02-08 DIAGNOSIS — D631 Anemia in chronic kidney disease: Secondary | ICD-10-CM | POA: Diagnosis not present

## 2018-02-08 DIAGNOSIS — Z79899 Other long term (current) drug therapy: Secondary | ICD-10-CM | POA: Insufficient documentation

## 2018-02-08 DIAGNOSIS — C9002 Multiple myeloma in relapse: Secondary | ICD-10-CM | POA: Diagnosis not present

## 2018-02-08 DIAGNOSIS — N183 Chronic kidney disease, stage 3 (moderate): Secondary | ICD-10-CM | POA: Diagnosis not present

## 2018-02-08 DIAGNOSIS — Z23 Encounter for immunization: Secondary | ICD-10-CM

## 2018-02-08 DIAGNOSIS — I129 Hypertensive chronic kidney disease with stage 1 through stage 4 chronic kidney disease, or unspecified chronic kidney disease: Secondary | ICD-10-CM | POA: Insufficient documentation

## 2018-02-08 LAB — COMPREHENSIVE METABOLIC PANEL
ALT: 43 U/L (ref 0–44)
ANION GAP: 10 (ref 5–15)
AST: 48 U/L — ABNORMAL HIGH (ref 15–41)
Albumin: 3.2 g/dL — ABNORMAL LOW (ref 3.5–5.0)
Alkaline Phosphatase: 58 U/L (ref 38–126)
BUN: 22 mg/dL (ref 8–23)
CALCIUM: 8.9 mg/dL (ref 8.9–10.3)
CO2: 26 mmol/L (ref 22–32)
CREATININE: 1.13 mg/dL — AB (ref 0.44–1.00)
Chloride: 105 mmol/L (ref 98–111)
GFR calc non Af Amer: 44 mL/min — ABNORMAL LOW (ref >60)
GFR, EST AFRICAN AMERICAN: 51 mL/min — AB (ref >60)
Glucose, Bld: 116 mg/dL — ABNORMAL HIGH (ref 70–99)
Potassium: 4.5 mmol/L (ref 3.5–5.1)
Sodium: 141 mmol/L (ref 135–145)
Total Bilirubin: 0.7 mg/dL (ref 0.3–1.2)
Total Protein: 7.8 g/dL (ref 6.5–8.1)

## 2018-02-08 LAB — CBC WITH DIFFERENTIAL/PLATELET
Abs Immature Granulocytes: 0.07 10*3/uL (ref 0.00–0.07)
Basophils Absolute: 0 10*3/uL (ref 0.0–0.1)
Basophils Relative: 0 %
Eosinophils Absolute: 0 10*3/uL (ref 0.0–0.5)
Eosinophils Relative: 0 %
HCT: 36.1 % (ref 36.0–46.0)
Hemoglobin: 11 g/dL — ABNORMAL LOW (ref 12.0–15.0)
Immature Granulocytes: 1 %
Lymphocytes Relative: 9 %
Lymphs Abs: 0.8 10*3/uL (ref 0.7–4.0)
MCH: 29.4 pg (ref 26.0–34.0)
MCHC: 30.5 g/dL (ref 30.0–36.0)
MCV: 96.5 fL (ref 80.0–100.0)
Monocytes Absolute: 0.3 10*3/uL (ref 0.1–1.0)
Monocytes Relative: 4 %
Neutro Abs: 7.8 10*3/uL — ABNORMAL HIGH (ref 1.7–7.7)
Neutrophils Relative %: 86 %
Platelets: 368 10*3/uL (ref 150–400)
RBC: 3.74 MIL/uL — AB (ref 3.87–5.11)
RDW: 17.2 % — ABNORMAL HIGH (ref 11.5–15.5)
WBC: 9.1 10*3/uL (ref 4.0–10.5)
nRBC: 0 % (ref 0.0–0.2)

## 2018-02-08 MED ORDER — INFLUENZA VAC SPLIT HIGH-DOSE 0.5 ML IM SUSY
0.5000 mL | PREFILLED_SYRINGE | Freq: Once | INTRAMUSCULAR | Status: AC
  Administered 2018-02-08: 0.5 mL via INTRAMUSCULAR
  Filled 2018-02-08: qty 0.5

## 2018-02-08 MED ORDER — ZOLEDRONIC ACID 4 MG/5ML IV CONC
3.0000 mg | Freq: Once | INTRAVENOUS | Status: AC
  Administered 2018-02-08: 3 mg via INTRAVENOUS
  Filled 2018-02-08: qty 3.75

## 2018-02-08 MED ORDER — SODIUM CHLORIDE 0.9 % IV SOLN
Freq: Once | INTRAVENOUS | Status: AC
  Administered 2018-02-08: 14:00:00 via INTRAVENOUS
  Filled 2018-02-08: qty 250

## 2018-02-08 NOTE — Progress Notes (Signed)
Per Dr. Rogue Bussing okay to proceed with Zometa with Calcium of 8.9 and creatinine of 1.13, pt to receive flu shot at this time.

## 2018-02-08 NOTE — Progress Notes (Signed)
Patient here for follow up. She was hospitalized in December for pneumonia. Patient denies symptoms today.

## 2018-02-08 NOTE — Progress Notes (Signed)
[place] OFFICE PROGRESS NOTE  Patient Care Team: [name], MD as PCP - General (Family Medicine)  Cancer Staging No matching staging information was found for the patient.   Oncology History   # MAY 2019- MULTIPLE MYELOMA [No BMBx]; IgA-K [baseline- 3.4 gm/dl; ]  # Hypercalcemia- s/p Zometa 3m IVP [May 2019]  # Mod dementia; s/p Left hip Fx [may 2019]  ----------------------------------------------------------------------  DIAGNOSIS: [MAY 2019]- ACTIVE MULTIPLE MYELOMA  STAGE:  II  ; GOALS: PALLIATIVE  CURRENT/MOST RECENT THERAPY: Dex 476m3/w; Rev 22m34mw-On/1w-OFF      Multiple [facility]oma in relapse ([facility]    INTERVAL HISTORY:  Theresa Lynch 61o.  female pleasant patient above history of dementia; also history of multiple myeloma currently on Revlimid dexamethasone is here for follow-up.  Patient was recently admitted to the hospital for pneumonia/RSV infection.  Patient was treated with antibiotics.  She is currently home overall improving.  Revlimid dexamethasone was held because of acute infection initially.  Her energy levels are improving.  No worsening shortness of breath or cough.  Denies any swelling in the legs.   Review of Systems  Constitutional: Positive for malaise/fatigue. Negative for chills, diaphoresis, fever and weight loss.  HENT: Negative for nosebleeds and sore throat.   Eyes: Negative for double vision.  Respiratory: Negative for cough, hemoptysis, sputum production, shortness of breath and wheezing.   Cardiovascular: Negative for chest pain, palpitations and orthopnea.  Gastrointestinal: Negative for abdominal pain, blood in stool, constipation, diarrhea, heartburn, melena, nausea and vomiting.  Genitourinary: Negative for dysuria, frequency and urgency.  Musculoskeletal: Negative for back pain and joint pain.  Neurological: Negative for dizziness, tingling, focal weakness, weakness and headaches.   Endo/Heme/Allergies: Does not bruise/bleed easily.  Psychiatric/Behavioral: Positive for memory loss. Negative for depression. The patient is not nervous/anxious and does not have insomnia.       PAST MEDICAL HISTORY :  Past Medical History:  Diagnosis Date  . Arthritis    hands  . Cataract   . Claustrophobia   . Depression    with anxiety/h/o agoraphobia  . Glaucoma   . Hip fracture ([facility]  left hip  . HOH (hard of hearing)   . Hyperlipidemia   . Hypertension   . [place]oma ([facility] . PONV (postoperative nausea and vomiting)     PAST SURGICAL HISTORY :   Past Surgical History:  Procedure Laterality Date  . CATARACT EXTRACTION W/PHACO Right 03/15/2015   Procedure: CATARACT EXTRACTION PHACO AND INTRAOCULAR LENS PLACEMENT (IOC);  Surgeon: AniRonnell FreshwaterD;  [facility];  Laterality: Right;  . HIP SURGERY    . INTRAMEDULLARY (IM) NAIL INTERTROCHANTERIC Left 05/25/2017   Procedure: INTRAMEDULLARY (IM) NAIL INTERTROCHANTRIC;  Surgeon: KraThornton ParkD;  Location: [facility] ORS;  Service: Orthopedics;  Laterality: Left;  . ROTATOR CUFF REPAIR  1999   bilateral    FAMILY HISTORY :   Family History  Problem Relation Age of Onset  . Cancer - Other Son        appendix cancer  . Prostate cancer Brother   . CAD Brother   . Kidney cancer Father   . Breast cancer Sister   . Stroke Sister     SOCIAL HISTORY:   Social History   Tobacco Use  . Smoking status: Never Smoker  . Smokeless tobacco: Never Used  Substance Use Topics  . Alcohol use: Yes    Alcohol/week: 7.0  standard drinks    Types: 7 Glasses of wine per week  . Drug use: No    ALLERGIES:  is allergic to codeine and penicillins.  MEDICATIONS:  Current Outpatient Medications  Medication Sig Dispense Refill  . amLODipine (NORVASC) 5 MG tablet Take 1 tablet (5 mg total) by mouth daily. 30 tablet 0  . aspirin EC 81 MG tablet Take 1 tablet (81 mg total)  by mouth daily. 30 tablet 1  . atorvastatin (LIPITOR) 10 MG tablet Take 5 mg by mouth daily.     . calcium gluconate 500 MG tablet Take 1 tablet by mouth 2 (two) times daily.    . cholecalciferol (VITAMIN D3) 25 MCG (1000 UT) tablet Take 2,000 Units by mouth 2 (two) times daily.     Marland Kitchen dexamethasone (DECADRON) 4 MG tablet TAKE ONE TABLET THREE TIMES A WEEK 12 tablet 4  . lenalidomide (REVLIMID) 5 MG capsule Take 1 capsule (5 mg total) by mouth daily. Take for 14 days on, then 7 days off. 14 capsule 0  . levothyroxine (SYNTHROID, LEVOTHROID) 25 MCG tablet Take 1 tablet (25 mcg total) by mouth daily before breakfast. 30 tablet 1  . PARoxetine (PAXIL) 40 MG tablet Take 40 mg by mouth at bedtime.    . polyethylene glycol (MIRALAX / GLYCOLAX) packet Take 17 g by mouth daily. (Patient taking differently: Take 17 g by mouth daily as needed for mild constipation or moderate constipation. ) 14 each 0  . traMADol (ULTRAM) 50 MG tablet Take 1 tablet (50 mg total) by mouth every 6 (six) hours as needed for moderate pain or severe pain. 30 tablet 0  . traZODone (DESYREL) 50 MG tablet Take 25 mg by mouth at bedtime.     . vitamin B-12 (CYANOCOBALAMIN) 1000 MCG tablet Take 1,000 mcg by mouth daily.     No current facility-administered medications for this visit.     PHYSICAL EXAMINATION: ECOG PERFORMANCE STATUS: 1 - Symptomatic but completely ambulatory  BP 126/82 (BP Location: Left Arm, Patient Position: Sitting)   Pulse (!) 118   Temp 98.5 F (36.9 C) (Tympanic)   Resp 12   Ht '5\' 6"'  (1.676 m)   Wt 124 lb 9.6 oz (56.5 kg)   BMI 20.11 kg/m   Filed Weights   02/08/18 1320  Weight: 124 lb 9.6 oz (56.5 kg)    Physical Exam  Constitutional:  [place] Caucasian female patient.  Resting in a wheelchair.  She is accompanied by daughter.  HENT:  Head: Normocephalic and atraumatic.  Mouth/Throat: Oropharynx is clear and moist. No oropharyngeal exudate.  Eyes: Pupils are equal, round, and  reactive to light.  Neck: Normal range of motion. Neck supple.  Cardiovascular: Normal rate and regular rhythm.  Pulmonary/Chest: No respiratory distress. She has no wheezes.  Abdominal: Soft. Bowel sounds are normal. She exhibits no distension and no mass. There is no abdominal tenderness. There is no rebound and no guarding.  Musculoskeletal: Normal range of motion.        General: No tenderness or edema.  Neurological: She is alert.  Oriented 2-3.  Skin: Skin is warm.  Psychiatric: Affect normal.       LABORATORY DATA:  I have reviewed the data as listed    Component Value Date/Time   NA 141 02/08/2018 1255   K 4.5 02/08/2018 1255   CL 105 02/08/2018 1255   CO2 26 02/08/2018 1255   GLUCOSE 116 (H) 02/08/2018 1255   BUN 22 02/08/2018 1255  CREATININE 1.13 (H) 02/08/2018 1255   CALCIUM 8.9 02/08/2018 1255   CALCIUM 10.1 05/26/2017 0419   PROT 7.8 02/08/2018 1255   ALBUMIN 3.2 (L) 02/08/2018 1255   AST 48 (H) 02/08/2018 1255   ALT 43 02/08/2018 1255   ALKPHOS 58 02/08/2018 1255   BILITOT 0.7 02/08/2018 1255   GFRNONAA 44 (L) 02/08/2018 1255   GFRAA 51 (L) 02/08/2018 1255    No results found for: SPEP, UPEP  Lab Results  Component Value Date   WBC 9.1 02/08/2018   NEUTROABS 7.8 (H) 02/08/2018   HGB 11.0 (L) 02/08/2018   HCT 36.1 02/08/2018   MCV 96.5 02/08/2018   PLT 368 02/08/2018      Chemistry      Component Value Date/Time   NA 141 02/08/2018 1255   K 4.5 02/08/2018 1255   CL 105 02/08/2018 1255   CO2 26 02/08/2018 1255   BUN 22 02/08/2018 1255   CREATININE 1.13 (H) 02/08/2018 1255      Component Value Date/Time   CALCIUM 8.9 02/08/2018 1255   CALCIUM 10.1 05/26/2017 0419   ALKPHOS 58 02/08/2018 1255   AST 48 (H) 02/08/2018 1255   ALT 43 02/08/2018 1255   BILITOT 0.7 02/08/2018 1255       RADIOGRAPHIC STUDIES: I have personally reviewed the radiological images as listed and agreed with the findings in the report. No results found.    ASSESSMENT & PLAN:  Multiple myeloma in relapse ([place]) # Multiple myeloma -IgA kappa; symptomatic.  Stage II.  ON dexamethasone 4 mg 3 times a week;Revlimid 5 mg 2 weeks on 1 week off.  M protein decreasing-0.3gm/dl; dec 2019 M=1.3;K/l= ratio=29; STABLE.   #Hold Revlimid dexamethasone-given the recent respiratory infection.  We will plan to restart at next visit.  Await myeloma panel from today.  #Recent pneumonia/RSV infection-status post antibiotics.  Clinically stable.  # CKD- stage III-1.2 stable.  # Anemia hemoglobin- sec to MM-11.3 stable  # Hypercalcemia/MM- improved on Zometa 70m q 6 weeks; proceed with Zometa today.  # Flu shot today  # DISPOSITION: # Zometa today/Flu shot today # follow up in 3 weeks/labs- cbc/Cmp Dr.B   Orders Placed This Encounter  Procedures  . CBC with Differential    Standing Status:   Future    Standing Expiration Date:   01/09/2020  . Comprehensive metabolic panel    Standing Status:   Future    Standing Expiration Date:   01/09/2020   All questions were answered. The patient knows to call the clinic with any problems, questions or concerns.      GCammie Sickle MD 02/08/2018 7:27 PM

## 2018-02-08 NOTE — Assessment & Plan Note (Addendum)
#  Multiple myeloma -IgA kappa; symptomatic.  Stage II.  ON dexamethasone 4 mg 3 times a week;Revlimid 5 mg 2 weeks on 1 week off.  M protein decreasing-0.3gm/dl; dec 2019 M=1.3;K/l= ratio=29; STABLE.   #Hold Revlimid dexamethasone-given the recent respiratory infection.  We will plan to restart at next visit.  Await myeloma panel from today.  #Recent pneumonia/RSV infection-status post antibiotics.  Clinically stable.  # CKD- stage III-1.2 stable.  # Anemia hemoglobin- sec to MM-11.3 stable  # Hypercalcemia/MM- improved on Zometa '3mg'$  q 6 weeks; proceed with Zometa today.  # Flu shot today  # DISPOSITION: # Zometa today/Flu shot today # follow up in 3 weeks/labs- cbc/Cmp Dr.B

## 2018-02-11 LAB — KAPPA/LAMBDA LIGHT CHAINS
KAPPA FREE LGHT CHN: 180 mg/L — AB (ref 3.3–19.4)
Kappa, lambda light chain ratio: 60 — ABNORMAL HIGH (ref 0.26–1.65)
Lambda free light chains: 3 mg/L — ABNORMAL LOW (ref 5.7–26.3)

## 2018-02-12 LAB — MULTIPLE MYELOMA PANEL, SERUM
Albumin SerPl Elph-Mcnc: 3.1 g/dL (ref 2.9–4.4)
Albumin/Glob SerPl: 0.8 (ref 0.7–1.7)
Alpha 1: 0.2 g/dL (ref 0.0–0.4)
Alpha2 Glob SerPl Elph-Mcnc: 0.7 g/dL (ref 0.4–1.0)
B-Globulin SerPl Elph-Mcnc: 3 g/dL — ABNORMAL HIGH (ref 0.7–1.3)
Gamma Glob SerPl Elph-Mcnc: 0.2 g/dL — ABNORMAL LOW (ref 0.4–1.8)
Globulin, Total: 4.2 g/dL — ABNORMAL HIGH (ref 2.2–3.9)
IgA: 3042 mg/dL — ABNORMAL HIGH (ref 64–422)
IgG (Immunoglobin G), Serum: 381 mg/dL — ABNORMAL LOW (ref 700–1600)
IgM (Immunoglobulin M), Srm: 5 mg/dL — ABNORMAL LOW (ref 26–217)
M Protein SerPl Elph-Mcnc: 2.1 g/dL — ABNORMAL HIGH
Total Protein ELP: 7.3 g/dL (ref 6.0–8.5)

## 2018-03-01 ENCOUNTER — Inpatient Hospital Stay: Payer: PPO

## 2018-03-01 ENCOUNTER — Inpatient Hospital Stay: Payer: PPO | Attending: Internal Medicine | Admitting: Internal Medicine

## 2018-03-01 ENCOUNTER — Inpatient Hospital Stay: Payer: PPO | Admitting: Internal Medicine

## 2018-03-01 ENCOUNTER — Encounter: Payer: Self-pay | Admitting: Internal Medicine

## 2018-03-01 VITALS — BP 104/75 | HR 128 | Temp 98.2°F | Resp 16 | Wt 129.2 lb

## 2018-03-01 DIAGNOSIS — C9002 Multiple myeloma in relapse: Secondary | ICD-10-CM

## 2018-03-01 DIAGNOSIS — N183 Chronic kidney disease, stage 3 (moderate): Secondary | ICD-10-CM | POA: Insufficient documentation

## 2018-03-01 DIAGNOSIS — E785 Hyperlipidemia, unspecified: Secondary | ICD-10-CM | POA: Insufficient documentation

## 2018-03-01 DIAGNOSIS — Z79899 Other long term (current) drug therapy: Secondary | ICD-10-CM | POA: Diagnosis not present

## 2018-03-01 DIAGNOSIS — I129 Hypertensive chronic kidney disease with stage 1 through stage 4 chronic kidney disease, or unspecified chronic kidney disease: Secondary | ICD-10-CM

## 2018-03-01 LAB — CBC WITH DIFFERENTIAL/PLATELET
Abs Immature Granulocytes: 0.05 10*3/uL (ref 0.00–0.07)
Basophils Absolute: 0 10*3/uL (ref 0.0–0.1)
Basophils Relative: 0 %
EOS ABS: 0 10*3/uL (ref 0.0–0.5)
Eosinophils Relative: 0 %
HCT: 34.3 % — ABNORMAL LOW (ref 36.0–46.0)
Hemoglobin: 10.5 g/dL — ABNORMAL LOW (ref 12.0–15.0)
Immature Granulocytes: 0 %
Lymphocytes Relative: 7 %
Lymphs Abs: 0.7 10*3/uL (ref 0.7–4.0)
MCH: 29.7 pg (ref 26.0–34.0)
MCHC: 30.6 g/dL (ref 30.0–36.0)
MCV: 96.9 fL (ref 80.0–100.0)
Monocytes Absolute: 0.3 10*3/uL (ref 0.1–1.0)
Monocytes Relative: 2 %
Neutro Abs: 10.1 10*3/uL — ABNORMAL HIGH (ref 1.7–7.7)
Neutrophils Relative %: 91 %
PLATELETS: 318 10*3/uL (ref 150–400)
RBC: 3.54 MIL/uL — AB (ref 3.87–5.11)
RDW: 17.6 % — AB (ref 11.5–15.5)
WBC: 11.2 10*3/uL — AB (ref 4.0–10.5)
nRBC: 0 % (ref 0.0–0.2)

## 2018-03-01 LAB — COMPREHENSIVE METABOLIC PANEL
ALK PHOS: 55 U/L (ref 38–126)
ALT: 32 U/L (ref 0–44)
AST: 22 U/L (ref 15–41)
Albumin: 3.2 g/dL — ABNORMAL LOW (ref 3.5–5.0)
Anion gap: 8 (ref 5–15)
BILIRUBIN TOTAL: 0.8 mg/dL (ref 0.3–1.2)
BUN: 20 mg/dL (ref 8–23)
CO2: 26 mmol/L (ref 22–32)
Calcium: 9.1 mg/dL (ref 8.9–10.3)
Chloride: 103 mmol/L (ref 98–111)
Creatinine, Ser: 1.14 mg/dL — ABNORMAL HIGH (ref 0.44–1.00)
GFR calc Af Amer: 50 mL/min — ABNORMAL LOW (ref >60)
GFR calc non Af Amer: 44 mL/min — ABNORMAL LOW (ref >60)
Glucose, Bld: 102 mg/dL — ABNORMAL HIGH (ref 70–99)
Potassium: 5 mmol/L (ref 3.5–5.1)
Sodium: 137 mmol/L (ref 135–145)
TOTAL PROTEIN: 8.1 g/dL (ref 6.5–8.1)

## 2018-03-01 MED ORDER — LENALIDOMIDE 5 MG PO CAPS: 5.0000 mg | ORAL_CAPSULE | Freq: Every day | ORAL | 0 refills | Status: DC

## 2018-03-01 NOTE — Progress Notes (Signed)
[place] OFFICE PROGRESS NOTE  Patient Care Team: [name], MD as PCP - General (Family Medicine)  Cancer Staging No matching staging information was found for the patient.   Oncology History   # MAY 2019- MULTIPLE MYELOMA [No BMBx]; IgA-K [baseline- 3.4 gm/dl; ]  # Hypercalcemia- s/p Zometa 56m IVP [May 2019]  # Mod dementia; s/p Left hip Fx [may 2019]  ----------------------------------------------------------------------  DIAGNOSIS: [MAY 2019]- ACTIVE MULTIPLE MYELOMA  STAGE:  II  ; GOALS: PALLIATIVE  CURRENT/MOST RECENT THERAPY: Dex 4359m3/w; Rev 59m78mw-On/1w-OFF      [place]    INTERVAL HISTORY:  [place] Hub[place] 66o.  female pleasant patient above history of dementia; also history of multiple myeloma most recently on Revlimid dexamethasone is here for follow-up.   Patient was taken off Revlimid because of recent pneumonia.   Currently her energy levels are improving.  Denies any worsening cough.  No swelling in the legs.  Review of Systems  Constitutional: Positive for malaise/fatigue. Negative for chills, diaphoresis, fever and weight loss.  HENT: Negative for nosebleeds and sore throat.   Eyes: Negative for double vision.  Respiratory: Negative for cough, hemoptysis, sputum production, shortness of breath and wheezing.   Cardiovascular: Negative for chest pain, palpitations and orthopnea.  Gastrointestinal: Negative for abdominal pain, blood in stool, constipation, diarrhea, heartburn, melena, nausea and vomiting.  Genitourinary: Negative for dysuria, frequency and urgency.  Musculoskeletal: Negative for back pain and joint pain.  Neurological: Negative for dizziness, tingling, focal weakness, weakness and headaches.  Endo/Heme/Allergies: Does not bruise/bleed easily.  Psychiatric/Behavioral: Positive for memory loss. Negative for depression. The patient is not nervous/anxious and does not have  insomnia.       PAST MEDICAL HISTORY :  Past Medical History:  Diagnosis Date  . Arthritis    hands  . Cataract   . Claustrophobia   . Depression    with anxiety/h/o agoraphobia  . Glaucoma   . [place] ([place]  [place]  . HOH (hard of hearing)   . Hyperlipidemia   . Hypertension   . [place] . PONV (postoperative nausea and vomiting)     PAST SURGICAL HISTORY :   Past Surgical History:  Procedure Laterality Date  . CATARACT EXTRACTION W/PHACO Right 03/15/2015   Procedure: CATARACT EXTRACTION PHACO AND INTRAOCULAR LENS PLACEMENT (IOC);  Surgeon: AniRonnell FreshwaterD;  [facility];  Laterality: Right;  . HIP SURGERY    . INTRAMEDULLARY (IM) NAIL INTERTROCHANTERIC Left 05/25/2017   Procedure: INTRAMEDULLARY (IM) NAIL INTERTROCHANTRIC;  Surgeon: KraThornton ParkD;  Location: [facility] ORS;  Service: Orthopedics;  Laterality: Left;  . ROTATOR CUFF REPAIR  1999   bilateral    FAMILY HISTORY :   Family History  Problem Relation Age of Onset  . Cancer - Other Son        appendix cancer  . Prostate cancer Brother   . CAD Brother   . Kidney cancer Father   . Breast cancer Sister   . Stroke Sister     SOCIAL HISTORY:   Social History   Tobacco Use  . Smoking status: Never Smoker  . Smokeless tobacco: Never Used  Substance Use Topics  . Alcohol use: Yes    Alcohol/week: 7.0 standard drinks    Types: 7 Glasses of wine per week  . Drug use: No    ALLERGIES:  is allergic to  codeine and penicillins.  MEDICATIONS:  Current Outpatient Medications  Medication Sig Dispense Refill  . amLODipine (NORVASC) 5 MG tablet Take 1 tablet (5 mg total) by mouth daily. 30 tablet 0  . aspirin EC 81 MG tablet Take 1 tablet (81 mg total) by mouth daily. 30 tablet 1  . atorvastatin (LIPITOR) 10 MG tablet Take 5 mg by mouth daily.     . calcium gluconate 500 MG tablet Take 1 tablet by mouth 2 (two) times daily.    .  cholecalciferol (VITAMIN D3) 25 MCG (1000 UT) tablet Take 2,000 Units by mouth 2 (two) times daily.     Marland Kitchen dexamethasone (DECADRON) 4 MG tablet TAKE ONE TABLET THREE TIMES A WEEK 12 tablet 4  . levothyroxine (SYNTHROID, LEVOTHROID) 25 MCG tablet Take 1 tablet (25 mcg total) by mouth daily before breakfast. 30 tablet 1  . PARoxetine (PAXIL) 40 MG tablet Take 40 mg by mouth at bedtime.    . traZODone (DESYREL) 50 MG tablet Take 25 mg by mouth at bedtime.     . vitamin B-12 (CYANOCOBALAMIN) 1000 MCG tablet Take 1,000 mcg by mouth daily.    Marland Kitchen lenalidomide (REVLIMID) 5 MG capsule Take 1 capsule (5 mg total) by mouth daily. Take for 14 days on, then 7 days off. 14 capsule 0  . polyethylene glycol (MIRALAX / GLYCOLAX) packet Take 17 g by mouth daily. (Patient not taking: Reported on 03/01/2018) 14 each 0  . traMADol (ULTRAM) 50 MG tablet Take 1 tablet (50 mg total) by mouth every 6 (six) hours as needed for moderate pain or severe pain. (Patient not taking: Reported on 03/01/2018) 30 tablet 0   No current facility-administered medications for this visit.     PHYSICAL EXAMINATION: ECOG PERFORMANCE STATUS: 1 - Symptomatic but completely ambulatory  BP 104/75 (BP Location: Left Arm, Patient Position: Sitting)   Pulse (!) 128   Temp 98.2 F (36.8 C) (Tympanic)   Resp 16   Wt 129 lb 4 oz (58.6 kg)   SpO2 96%   BMI 20.86 kg/m   Filed Weights   03/01/18 1352  Weight: 129 lb 4 oz (58.6 kg)    Physical Exam  Constitutional:  Frail-appearing Caucasian female patient.  Resting in a wheelchair.  She is accompanied by daughter.  HENT:  Head: Normocephalic and atraumatic.  Mouth/Throat: Oropharynx is clear and moist. No oropharyngeal exudate.  Eyes: Pupils are equal, round, and reactive to light.  Neck: Normal range of motion. Neck supple.  Cardiovascular: Normal rate and regular rhythm.  Pulmonary/Chest: No respiratory distress. She has no wheezes.  Abdominal: Soft. Bowel sounds are normal. She  exhibits no distension and no mass. There is no abdominal tenderness. There is no rebound and no guarding.  Musculoskeletal: Normal range of motion.        General: No tenderness or edema.  Neurological: She is alert.  Oriented 2-3.  Skin: Skin is warm.  Psychiatric: Affect normal.       LABORATORY DATA:  I have reviewed the data as listed    Component Value Date/Time   NA 137 03/01/2018 1315   K 5.0 03/01/2018 1315   CL 103 03/01/2018 1315   CO2 26 03/01/2018 1315   GLUCOSE 102 (H) 03/01/2018 1315   BUN 20 03/01/2018 1315   CREATININE 1.14 (H) 03/01/2018 1315   CALCIUM 9.1 03/01/2018 1315   CALCIUM 10.1 05/26/2017 0419   PROT 8.1 03/01/2018 1315   ALBUMIN 3.2 (L) 03/01/2018 1315   AST 22  03/01/2018 1315   ALT 32 03/01/2018 1315   ALKPHOS 55 03/01/2018 1315   BILITOT 0.8 03/01/2018 1315   GFRNONAA 44 (L) 03/01/2018 1315   GFRAA 50 (L) 03/01/2018 1315    No results found for: SPEP, UPEP  Lab Results  Component Value Date   WBC 11.2 (H) 03/01/2018   NEUTROABS 10.1 (H) 03/01/2018   HGB 10.5 (L) 03/01/2018   HCT 34.3 (L) 03/01/2018   MCV 96.9 03/01/2018   PLT 318 03/01/2018      Chemistry      Component Value Date/Time   NA 137 03/01/2018 1315   K 5.0 03/01/2018 1315   CL 103 03/01/2018 1315   CO2 26 03/01/2018 1315   BUN 20 03/01/2018 1315   CREATININE 1.14 (H) 03/01/2018 1315      Component Value Date/Time   CALCIUM 9.1 03/01/2018 1315   CALCIUM 10.1 05/26/2017 0419   ALKPHOS 55 03/01/2018 1315   AST 22 03/01/2018 1315   ALT 32 03/01/2018 1315   BILITOT 0.8 03/01/2018 1315       RADIOGRAPHIC STUDIES: I have personally reviewed the radiological images as listed and agreed with the findings in the report. No results found.   ASSESSMENT & PLAN:  Multiple myeloma in relapse ([place]) # Multiple myeloma -IgA kappa; symptomatic.  Stage II.  ON dexamethasone 4 mg 3 times a week;Revlimid 5 mg 2 weeks on 1 week off.  M protein decreasing-0.3gm/dl; dec 2019  M=1.3;K/l= ratio=29; RISING.  # re-start Revlimid 5 mg; with dex 4 mg three times/week.  I reviewed my concerns for worsening light chains with patient family.  #Recent pneumonia/RSV infection-clinically stable.  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  # Hypercalcemia/MM- improved on Zometa 45m q 6 weeks; proceed with Zometa today.  # DISPOSITION: # follow up in 3 weeks/labs- cbc/Cmp/Zometa-  Dr.B   Orders Placed This Encounter  Procedures  . Comprehensive metabolic panel    Standing Status:   Future    Standing Expiration Date:   03/02/2019  . CBC with Differential    Standing Status:   Future    Standing Expiration Date:   03/02/2019   All questions were answered. The patient knows to call the clinic with any problems, questions or concerns.      GCammie Sickle MD 03/17/2018 5:50 PM

## 2018-03-01 NOTE — Assessment & Plan Note (Addendum)
#  Multiple myeloma -IgA kappa; symptomatic.  Stage II.  ON dexamethasone 4 mg 3 times a week;Revlimid 5 mg 2 weeks on 1 week off.  M protein decreasing-0.3gm/dl; dec 2019 M=1.3;K/l= ratio=29; RISING.  # re-start Revlimid 5 mg; with dex 4 mg three times/week.  I reviewed my concerns for worsening light chains with patient family.  #Recent pneumonia/RSV infection-clinically stable.  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  # Hypercalcemia/MM- improved on Zometa '3mg'$  q 6 weeks; proceed with Zometa today.  # DISPOSITION: # follow up in 3 weeks/labs- cbc/Cmp/Zometa-  Dr.B

## 2018-03-01 NOTE — Progress Notes (Signed)
Pt in for follow up, denies any difficulties or concerns today.  Heart rate elevated @ 128, daughter states "was like that the last time".

## 2018-03-20 ENCOUNTER — Other Ambulatory Visit: Payer: Self-pay | Admitting: *Deleted

## 2018-03-20 DIAGNOSIS — C9002 Multiple myeloma in relapse: Secondary | ICD-10-CM

## 2018-03-20 MED ORDER — LENALIDOMIDE 5 MG PO CAPS: 5.0000 mg | ORAL_CAPSULE | Freq: Every day | ORAL | 0 refills | Status: DC

## 2018-03-22 ENCOUNTER — Encounter: Payer: Self-pay | Admitting: Internal Medicine

## 2018-03-22 ENCOUNTER — Inpatient Hospital Stay: Payer: PPO

## 2018-03-22 ENCOUNTER — Other Ambulatory Visit: Payer: Self-pay

## 2018-03-22 ENCOUNTER — Inpatient Hospital Stay: Payer: PPO | Admitting: Internal Medicine

## 2018-03-22 VITALS — BP 132/80 | HR 80 | Temp 98.9°F | Resp 16 | Wt 133.6 lb

## 2018-03-22 DIAGNOSIS — I129 Hypertensive chronic kidney disease with stage 1 through stage 4 chronic kidney disease, or unspecified chronic kidney disease: Secondary | ICD-10-CM

## 2018-03-22 DIAGNOSIS — C9002 Multiple myeloma in relapse: Secondary | ICD-10-CM

## 2018-03-22 DIAGNOSIS — Z79899 Other long term (current) drug therapy: Secondary | ICD-10-CM | POA: Diagnosis not present

## 2018-03-22 DIAGNOSIS — E785 Hyperlipidemia, unspecified: Secondary | ICD-10-CM | POA: Diagnosis not present

## 2018-03-22 DIAGNOSIS — N183 Chronic kidney disease, stage 3 (moderate): Secondary | ICD-10-CM

## 2018-03-22 LAB — CBC WITH DIFFERENTIAL/PLATELET
ABS IMMATURE GRANULOCYTES: 0.08 10*3/uL — AB (ref 0.00–0.07)
Basophils Absolute: 0 10*3/uL (ref 0.0–0.1)
Basophils Relative: 0 %
Eosinophils Absolute: 1.6 10*3/uL — ABNORMAL HIGH (ref 0.0–0.5)
Eosinophils Relative: 14 %
HCT: 32.3 % — ABNORMAL LOW (ref 36.0–46.0)
Hemoglobin: 10.2 g/dL — ABNORMAL LOW (ref 12.0–15.0)
IMMATURE GRANULOCYTES: 1 %
Lymphocytes Relative: 6 %
Lymphs Abs: 0.7 10*3/uL (ref 0.7–4.0)
MCH: 30.4 pg (ref 26.0–34.0)
MCHC: 31.6 g/dL (ref 30.0–36.0)
MCV: 96.4 fL (ref 80.0–100.0)
Monocytes Absolute: 0.5 10*3/uL (ref 0.1–1.0)
Monocytes Relative: 4 %
NEUTROS ABS: 8.7 10*3/uL — AB (ref 1.7–7.7)
NEUTROS PCT: 75 %
NRBC: 0 % (ref 0.0–0.2)
Platelets: 314 10*3/uL (ref 150–400)
RBC: 3.35 MIL/uL — ABNORMAL LOW (ref 3.87–5.11)
RDW: 16.7 % — ABNORMAL HIGH (ref 11.5–15.5)
WBC: 11.7 10*3/uL — ABNORMAL HIGH (ref 4.0–10.5)

## 2018-03-22 LAB — COMPREHENSIVE METABOLIC PANEL
ALT: 16 U/L (ref 0–44)
AST: 15 U/L (ref 15–41)
Albumin: 3.1 g/dL — ABNORMAL LOW (ref 3.5–5.0)
Alkaline Phosphatase: 59 U/L (ref 38–126)
Anion gap: 9 (ref 5–15)
BUN: 23 mg/dL (ref 8–23)
CALCIUM: 8.6 mg/dL — AB (ref 8.9–10.3)
CO2: 23 mmol/L (ref 22–32)
Chloride: 103 mmol/L (ref 98–111)
Creatinine, Ser: 1.11 mg/dL — ABNORMAL HIGH (ref 0.44–1.00)
GFR calc Af Amer: 52 mL/min — ABNORMAL LOW (ref >60)
GFR calc non Af Amer: 45 mL/min — ABNORMAL LOW (ref >60)
Glucose, Bld: 110 mg/dL — ABNORMAL HIGH (ref 70–99)
Potassium: 4.2 mmol/L (ref 3.5–5.1)
SODIUM: 135 mmol/L (ref 135–145)
Total Bilirubin: 0.9 mg/dL (ref 0.3–1.2)
Total Protein: 7.6 g/dL (ref 6.5–8.1)

## 2018-03-22 MED ORDER — SODIUM CHLORIDE 0.9 % IV SOLN
INTRAVENOUS | Status: DC
  Administered 2018-03-22: 14:00:00 via INTRAVENOUS
  Filled 2018-03-22: qty 250

## 2018-03-22 MED ORDER — ZOLEDRONIC ACID 4 MG/5ML IV CONC
3.0000 mg | Freq: Once | INTRAVENOUS | Status: AC
  Administered 2018-03-22: 3 mg via INTRAVENOUS
  Filled 2018-03-22: qty 3.75

## 2018-03-22 NOTE — Assessment & Plan Note (Addendum)
#  Multiple myeloma -IgA kappa; symptomatic.  Stage II.  ON dexamethasone 4 mg 3 times a week;Revlimid 5 mg 2 weeks on 1 week off.  M protein decreasing-0.3gm/dl; dec 2019 M=1.3;K/l= ratio=29; RISING.  # Currently on  Revlimid 5 mg; with dex 4 mg three times/week.   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  # Hypercalcemia/MM- improved on Zometa 56m q 6 weeks; proceed with Zometa today.  # DISPOSITION: # Zometa today.  # follow up in 3 weeks/labs- cbc/Cmp/MM-k-l light chains-  Dr.B

## 2018-03-22 NOTE — Progress Notes (Signed)
[place] OFFICE PROGRESS NOTE  Patient Care Team: [name], MD as PCP - General (Family Medicine)  Cancer Staging No matching staging information was found for the patient.   Oncology History   # MAY 2019- MULTIPLE MYELOMA [No BMBx]; IgA-K [baseline- 3.4 gm/dl; ]  # Hypercalcemia- s/p Zometa 71m IVP [May 2019]  # Mod dementia; s/p Left hip Fx [may 2019]  ----------------------------------------------------------------------  DIAGNOSIS: [MAY 2019]- ACTIVE MULTIPLE MYELOMA  STAGE:  II  ; GOALS: PALLIATIVE  CURRENT/MOST RECENT THERAPY: Dex 482m3/w; Rev 69m67mw-On/1w-OFF      [place]    INTERVAL HISTORY:  Theresa Lynch 89o.  female pleasant patient above history of dementia; also history of multiple myeloma most recently on Revlimid dexamethasone is here for follow-up.   Patient was taken off Revlimid because of recent pneumonia; restarted on Revlimid about 3 weeks ago.  Denies any pain.  No falls.  Currently her energy levels are improving.  Denies any worsening cough.  No swelling in the legs.  Review of Systems  Constitutional: Positive for malaise/fatigue. Negative for chills, diaphoresis, fever and weight loss.  HENT: Negative for nosebleeds and sore throat.   Eyes: Negative for double vision.  Respiratory: Negative for cough, hemoptysis, sputum production, shortness of breath and wheezing.   Cardiovascular: Negative for chest pain, palpitations and orthopnea.  Gastrointestinal: Negative for abdominal pain, blood in stool, constipation, diarrhea, heartburn, melena, nausea and vomiting.  Genitourinary: Negative for dysuria, frequency and urgency.  Musculoskeletal: Negative for back pain and joint pain.  Neurological: Negative for dizziness, tingling, focal weakness, weakness and headaches.  Endo/Heme/Allergies: Does not bruise/bleed easily.  Psychiatric/Behavioral: Positive for memory loss. Negative for  depression. The patient is not nervous/anxious and does not have insomnia.       PAST MEDICAL HISTORY :  Past Medical History:  Diagnosis Date  . Arthritis    hands  . Cataract   . Claustrophobia   . Depression    with anxiety/h/o agoraphobia  . Glaucoma   . [place]  [place]  . HOH (hard of hearing)   . Hyperlipidemia   . Hypertension   . Multiple [place]oma ([place] . PONV (postoperative nausea and vomiting)     PAST SURGICAL HISTORY :   Past Surgical History:  Procedure Laterality Date  . CATARACT EXTRACTION W/PHACO Right 03/15/2015   Procedure: CATARACT EXTRACTION PHACO AND INTRAOCULAR LENS PLACEMENT (IOC);  Surgeon: AniRonnell FreshwaterD;  [facility];  Laterality: Right;  . HIP SURGERY    . INTRAMEDULLARY (IM) NAIL INTERTROCHANTERIC Left 05/25/2017   Procedure: INTRAMEDULLARY (IM) NAIL INTERTROCHANTRIC;  Surgeon: KraThornton ParkD;  Location: [facility] ORS;  Service: Orthopedics;  Laterality: Left;  . ROTATOR CUFF REPAIR  1999   bilateral    FAMILY HISTORY :   Family History  Problem Relation Age of Onset  . Cancer - Other Son        appendix cancer  . Prostate cancer Brother   . CAD Brother   . Kidney cancer Father   . Breast cancer Sister   . Stroke Sister     SOCIAL HISTORY:   Social History   Tobacco Use  . Smoking status: Never Smoker  . Smokeless tobacco: Never Used  Substance Use Topics  . Alcohol use: Yes    Alcohol/week: 7.0 standard drinks    Types: 7 Glasses of wine per week  .  Drug use: No    ALLERGIES:  is allergic to codeine and penicillins.  MEDICATIONS:  Current Outpatient Medications  Medication Sig Dispense Refill  . amLODipine (NORVASC) 5 MG tablet Take 1 tablet (5 mg total) by mouth daily. 30 tablet 0  . aspirin EC 81 MG tablet Take 1 tablet (81 mg total) by mouth daily. 30 tablet 1  . atorvastatin (LIPITOR) 10 MG tablet Take 5 mg by mouth daily.     . calcium gluconate  500 MG tablet Take 1 tablet by mouth 2 (two) times daily.    . cholecalciferol (VITAMIN D3) 25 MCG (1000 UT) tablet Take 2,000 Units by mouth 2 (two) times daily.     Marland Kitchen dexamethasone (DECADRON) 4 MG tablet TAKE ONE TABLET THREE TIMES A WEEK 12 tablet 4  . lenalidomide (REVLIMID) 5 MG capsule Take 1 capsule (5 mg total) by mouth daily. Take for 14 days on, then 7 days off. 14 capsule 0  . levothyroxine (SYNTHROID, LEVOTHROID) 25 MCG tablet Take 1 tablet (25 mcg total) by mouth daily before breakfast. 30 tablet 1  . PARoxetine (PAXIL) 40 MG tablet Take 40 mg by mouth at bedtime.    . polyethylene glycol (MIRALAX / GLYCOLAX) packet Take 17 g by mouth daily. 14 each 0  . traMADol (ULTRAM) 50 MG tablet Take 1 tablet (50 mg total) by mouth every 6 (six) hours as needed for moderate pain or severe pain. 30 tablet 0  . traZODone (DESYREL) 50 MG tablet Take 25 mg by mouth at bedtime.     . vitamin B-12 (CYANOCOBALAMIN) 1000 MCG tablet Take 1,000 mcg by mouth daily.     No current facility-administered medications for this visit.    Facility-Administered Medications Ordered in Other Visits  Medication Dose Route Frequency Provider Last Rate Last Dose  . 0.9 %  sodium chloride infusion   Intravenous Continuous Cammie Sickle, MD   Stopped at 03/22/18 1438    PHYSICAL EXAMINATION: ECOG PERFORMANCE STATUS: 1 - Symptomatic but completely ambulatory  BP 132/80 (BP Location: Left Arm, Patient Position: Sitting, Cuff Size: Normal)   Pulse 80   Temp 98.9 F (37.2 C) (Tympanic)   Resp 16   Wt 133 lb 9.6 oz (60.6 kg)   BMI 21.56 kg/m   Filed Weights   03/22/18 1326  Weight: 133 lb 9.6 oz (60.6 kg)    Physical Exam  Constitutional:  Frail-appearing Caucasian female patient.  Resting in a wheelchair.  She is accompanied by daughter.  HENT:  Head: Normocephalic and atraumatic.  Mouth/Throat: Oropharynx is clear and moist. No oropharyngeal exudate.  Eyes: Pupils are equal, round, and reactive  to light.  Neck: Normal range of motion. Neck supple.  Cardiovascular: Normal rate and regular rhythm.  Pulmonary/Chest: No respiratory distress. She has no wheezes.  Abdominal: Soft. Bowel sounds are normal. She exhibits no distension and no mass. There is no abdominal tenderness. There is no rebound and no guarding.  Musculoskeletal: Normal range of motion.        General: No tenderness or edema.  Neurological: She is alert.  Oriented 2-3.  Skin: Skin is warm.  Psychiatric: Affect normal.       LABORATORY DATA:  I have reviewed the data as listed    Component Value Date/Time   NA 135 03/22/2018 1300   K 4.2 03/22/2018 1300   CL 103 03/22/2018 1300   CO2 23 03/22/2018 1300   GLUCOSE 110 (H) 03/22/2018 1300   BUN 23 03/22/2018  1300   CREATININE 1.11 (H) 03/22/2018 1300   CALCIUM 8.6 (L) 03/22/2018 1300   CALCIUM 10.1 05/26/2017 0419   PROT 7.6 03/22/2018 1300   ALBUMIN 3.1 (L) 03/22/2018 1300   AST 15 03/22/2018 1300   ALT 16 03/22/2018 1300   ALKPHOS 59 03/22/2018 1300   BILITOT 0.9 03/22/2018 1300   GFRNONAA 45 (L) 03/22/2018 1300   GFRAA 52 (L) 03/22/2018 1300    No results found for: SPEP, UPEP  Lab Results  Component Value Date   WBC 11.7 (H) 03/22/2018   NEUTROABS 8.7 (H) 03/22/2018   HGB 10.2 (L) 03/22/2018   HCT 32.3 (L) 03/22/2018   MCV 96.4 03/22/2018   PLT 314 03/22/2018      Chemistry      Component Value Date/Time   NA 135 03/22/2018 1300   K 4.2 03/22/2018 1300   CL 103 03/22/2018 1300   CO2 23 03/22/2018 1300   BUN 23 03/22/2018 1300   CREATININE 1.11 (H) 03/22/2018 1300      Component Value Date/Time   CALCIUM 8.6 (L) 03/22/2018 1300   CALCIUM 10.1 05/26/2017 0419   ALKPHOS 59 03/22/2018 1300   AST 15 03/22/2018 1300   ALT 16 03/22/2018 1300   BILITOT 0.9 03/22/2018 1300       RADIOGRAPHIC STUDIES: I have personally reviewed the radiological images as listed and agreed with the findings in the report. No results found.    ASSESSMENT & PLAN:  Multiple myeloma in relapse ([place]) # Multiple myeloma -IgA kappa; symptomatic.  Stage II.  ON dexamethasone 4 mg 3 times a week;Revlimid 5 mg 2 weeks on 1 week off.  M protein decreasing-0.3gm/dl; dec 2019 M=1.3;K/l= ratio=29; RISING.  # Currently on  Revlimid 5 mg; with dex 4 mg three times/week.   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  # Hypercalcemia/MM- improved on Zometa 19m q 6 weeks; proceed with Zometa today.  # DISPOSITION: # Zometa today.  # follow up in 3 weeks/labs- cbc/Cmp/MM-k-l light chains-  Dr.B   Orders Placed This Encounter  Procedures  . CBC with Differential    Standing Status:   Future    Standing Expiration Date:   03/23/2019  . Comprehensive metabolic panel    Standing Status:   Future    Standing Expiration Date:   03/23/2019  . Multiple Myeloma Panel (SPEP&IFE w/QIG)    Standing Status:   Future    Standing Expiration Date:   03/23/2019  . Kappa/lambda light chains    Standing Status:   Future    Standing Expiration Date:   03/23/2019   All questions were answered. The patient knows to call the clinic with any problems, questions or concerns.      GCammie Sickle MD 03/22/2018 4:54 PM

## 2018-04-11 ENCOUNTER — Other Ambulatory Visit: Payer: Self-pay

## 2018-04-12 ENCOUNTER — Inpatient Hospital Stay (HOSPITAL_BASED_OUTPATIENT_CLINIC_OR_DEPARTMENT_OTHER): Payer: PPO | Admitting: Internal Medicine

## 2018-04-12 ENCOUNTER — Other Ambulatory Visit: Payer: Self-pay

## 2018-04-12 ENCOUNTER — Inpatient Hospital Stay: Payer: PPO | Attending: Internal Medicine

## 2018-04-12 ENCOUNTER — Encounter: Payer: Self-pay | Admitting: Internal Medicine

## 2018-04-12 VITALS — BP 150/78 | HR 85 | Temp 99.6°F | Resp 18 | Wt 134.3 lb

## 2018-04-12 DIAGNOSIS — Z79899 Other long term (current) drug therapy: Secondary | ICD-10-CM | POA: Diagnosis not present

## 2018-04-12 DIAGNOSIS — F0390 Unspecified dementia without behavioral disturbance: Secondary | ICD-10-CM | POA: Diagnosis not present

## 2018-04-12 DIAGNOSIS — Z7982 Long term (current) use of aspirin: Secondary | ICD-10-CM | POA: Diagnosis not present

## 2018-04-12 DIAGNOSIS — N183 Chronic kidney disease, stage 3 (moderate): Secondary | ICD-10-CM | POA: Insufficient documentation

## 2018-04-12 DIAGNOSIS — I10 Essential (primary) hypertension: Secondary | ICD-10-CM | POA: Diagnosis not present

## 2018-04-12 DIAGNOSIS — C9002 Multiple myeloma in relapse: Secondary | ICD-10-CM

## 2018-04-12 DIAGNOSIS — D6489 Other specified anemias: Secondary | ICD-10-CM | POA: Diagnosis not present

## 2018-04-12 LAB — COMPREHENSIVE METABOLIC PANEL
ALK PHOS: 53 U/L (ref 38–126)
ALT: 12 U/L (ref 0–44)
AST: 16 U/L (ref 15–41)
Albumin: 3.1 g/dL — ABNORMAL LOW (ref 3.5–5.0)
Anion gap: 11 (ref 5–15)
BUN: 19 mg/dL (ref 8–23)
CO2: 22 mmol/L (ref 22–32)
Calcium: 8.6 mg/dL — ABNORMAL LOW (ref 8.9–10.3)
Chloride: 105 mmol/L (ref 98–111)
Creatinine, Ser: 1.16 mg/dL — ABNORMAL HIGH (ref 0.44–1.00)
GFR calc Af Amer: 49 mL/min — ABNORMAL LOW (ref >60)
GFR calc non Af Amer: 43 mL/min — ABNORMAL LOW (ref >60)
Glucose, Bld: 107 mg/dL — ABNORMAL HIGH (ref 70–99)
Potassium: 3.6 mmol/L (ref 3.5–5.1)
SODIUM: 138 mmol/L (ref 135–145)
Total Bilirubin: 0.7 mg/dL (ref 0.3–1.2)
Total Protein: 7.3 g/dL (ref 6.5–8.1)

## 2018-04-12 LAB — CBC WITH DIFFERENTIAL/PLATELET
Abs Immature Granulocytes: 0.09 10*3/uL — ABNORMAL HIGH (ref 0.00–0.07)
Basophils Absolute: 0.1 10*3/uL (ref 0.0–0.1)
Basophils Relative: 1 %
Eosinophils Absolute: 0.1 10*3/uL (ref 0.0–0.5)
Eosinophils Relative: 1 %
HEMATOCRIT: 32.1 % — AB (ref 36.0–46.0)
Hemoglobin: 10.2 g/dL — ABNORMAL LOW (ref 12.0–15.0)
Immature Granulocytes: 1 %
LYMPHS ABS: 0.6 10*3/uL — AB (ref 0.7–4.0)
LYMPHS PCT: 5 %
MCH: 30 pg (ref 26.0–34.0)
MCHC: 31.8 g/dL (ref 30.0–36.0)
MCV: 94.4 fL (ref 80.0–100.0)
Monocytes Absolute: 0.4 10*3/uL (ref 0.1–1.0)
Monocytes Relative: 4 %
Neutro Abs: 10.5 10*3/uL — ABNORMAL HIGH (ref 1.7–7.7)
Neutrophils Relative %: 88 %
Platelets: 423 10*3/uL — ABNORMAL HIGH (ref 150–400)
RBC: 3.4 MIL/uL — ABNORMAL LOW (ref 3.87–5.11)
RDW: 15.6 % — ABNORMAL HIGH (ref 11.5–15.5)
WBC: 11.9 10*3/uL — ABNORMAL HIGH (ref 4.0–10.5)
nRBC: 0 % (ref 0.0–0.2)

## 2018-04-12 NOTE — Progress Notes (Signed)
Patient here for follow up. No concerns voiced.  °

## 2018-04-12 NOTE — Assessment & Plan Note (Addendum)
#  Multiple myeloma -IgA kappa; symptomatic.  Stage II.  ON dexamethasone 4 mg 3 times a week;Revlimid 5 mg 2 weeks on 1 week off.  M protein decreasing-0.3gm/dl; Jan 2020 M=2.1; K/l= ratio=60 RISING. [Secondary to multiple interruptions recent pneumonia etc.]  # Currently on  Revlimid 5 mg; with dex 4 mg three times/week.   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  # Hypercalcemia/MM- improved on Zometa 19m q 6 weeks.   # DISPOSITION:  # follow up in 4 weeks/labs- cbc/Cmp/MM-k-l light chains/possible Zometa-  Dr.B

## 2018-04-12 NOTE — Progress Notes (Signed)
The[facility] OFFICE PROGRESS NOTE  Patient Care Team: [name], MD as PCP - General (Family Medicine)  Cancer Staging No matching staging information was found for the patient.   Oncology History   # MAY 2019- MULTIPLE MYELOMA [No BMBx]; IgA-K [baseline- 3.4 gm/dl; ]  # Hypercalcemia- s/p Zometa 46m IVP [May 2019]  # Mod dementia; s/p Left hip Fx [may 2019]  ----------------------------------------------------------------------  DIAGNOSIS: [MAY 2019]- ACTIVE MULTIPLE MYELOMA  STAGE:  II  ; GOALS: PALLIATIVE  CURRENT/MOST RECENT THERAPY: Dex 442m3/w; Rev 68m54mw-On/1w-OFF      Multiple myeloma in relapse ([facility]    INTERVAL HISTORY:  Theresa Lynch 35o.  female pleasant patient above history of dementia; also history of multiple myeloma most recently on Revlimid dexamethasone is here for follow-up.  No skin rash.  No diarrhea.  Energy levels are fair.  No swelling in legs.  Review of Systems  Constitutional: Positive for malaise/fatigue. Negative for chills, diaphoresis, fever and weight loss.  HENT: Negative for nosebleeds and sore throat.   Eyes: Negative for double vision.  Respiratory: Negative for cough, hemoptysis, sputum production, shortness of breath and wheezing.   Cardiovascular: Negative for chest pain, palpitations and orthopnea.  Gastrointestinal: Negative for abdominal pain, blood in stool, constipation, diarrhea, heartburn, melena, nausea and vomiting.  Genitourinary: Negative for dysuria, frequency and urgency.  Musculoskeletal: Negative for back pain and joint pain.  Neurological: Negative for dizziness, tingling, focal weakness, weakness and headaches.  Endo/Heme/Allergies: Does not bruise/bleed easily.  Psychiatric/Behavioral: Positive for memory loss. Negative for depression. The patient is not nervous/anxious and does not have insomnia.       PAST MEDICAL HISTORY :  Past Medical History:  Diagnosis Date  .  Arthritis    hands  . Cataract   . Claustrophobia   . Depression    with anxiety/h/o agoraphobia  . Glaucoma   . Hip fracture ([facility]  left hip  . HOH (hard of hearing)   . Hyperlipidemia   . Hypertension   . Multiple myeloma ([facility] . PONV (postoperative nausea and vomiting)     PAST SURGICAL HISTORY :   Past Surgical History:  Procedure Laterality Date  . CATARACT EXTRACTION W/PHACO Right 03/15/2015   Procedure: CATARACT EXTRACTION PHACO AND INTRAOCULAR LENS PLACEMENT (IOC);  Surgeon: AniRonnell FreshwaterD;  [facility];  Laterality: Right;  . HIP SURGERY    . INTRAMEDULLARY (IM) NAIL INTERTROCHANTERIC Left 05/25/2017   Procedure: INTRAMEDULLARY (IM) NAIL INTERTROCHANTRIC;  Surgeon: KraThornton ParkD;  Location: [facility] ORS;  Service: Orthopedics;  Laterality: Left;  . ROTATOR CUFF REPAIR  1999   bilateral    FAMILY HISTORY :   Family History  Problem Relation Age of Onset  . Cancer - Other Son        appendix cancer  . Prostate cancer Brother   . CAD Brother   . Kidney cancer Father   . Breast cancer Sister   . Stroke Sister     SOCIAL HISTORY:   Social History   Tobacco Use  . Smoking status: Never Smoker  . Smokeless tobacco: Never Used  Substance Use Topics  . Alcohol use: Yes    Alcohol/week: 7.0 standard drinks    Types: 7 Glasses of wine per week  . Drug use: No    ALLERGIES:  is allergic to codeine and penicillins.  MEDICATIONS:  Current Outpatient Medications  Medication Sig Dispense  Refill  . amLODipine (NORVASC) 5 MG tablet Take 1 tablet (5 mg total) by mouth daily. 30 tablet 0  . aspirin EC 81 MG tablet Take 1 tablet (81 mg total) by mouth daily. 30 tablet 1  . atorvastatin (LIPITOR) 10 MG tablet Take 5 mg by mouth daily.     . calcium gluconate 500 MG tablet Take 1 tablet by mouth 2 (two) times daily.    . cholecalciferol (VITAMIN D3) 25 MCG (1000 UT) tablet Take 2,000 Units by mouth 2 (two)  times daily.     Marland Kitchen dexamethasone (DECADRON) 4 MG tablet TAKE ONE TABLET THREE TIMES A WEEK 12 tablet 4  . lenalidomide (REVLIMID) 5 MG capsule Take 1 capsule (5 mg total) by mouth daily. Take for 14 days on, then 7 days off. 14 capsule 0  . levothyroxine (SYNTHROID, LEVOTHROID) 25 MCG tablet Take 1 tablet (25 mcg total) by mouth daily before breakfast. 30 tablet 1  . PARoxetine (PAXIL) 40 MG tablet Take 40 mg by mouth at bedtime.    . polyethylene glycol (MIRALAX / GLYCOLAX) packet Take 17 g by mouth daily. 14 each 0  . traZODone (DESYREL) 50 MG tablet Take 25 mg by mouth at bedtime.     . vitamin B-12 (CYANOCOBALAMIN) 1000 MCG tablet Take 1,000 mcg by mouth daily.    . traMADol (ULTRAM) 50 MG tablet Take 1 tablet (50 mg total) by mouth every 6 (six) hours as needed for moderate pain or severe pain. (Patient not taking: Reported on 04/12/2018) 30 tablet 0   No current facility-administered medications for this visit.     PHYSICAL EXAMINATION: ECOG PERFORMANCE STATUS: 1 - Symptomatic but completely ambulatory  BP (!) 150/78 (BP Location: Right Arm)   Pulse 85   Temp 99.6 F (37.6 C) (Tympanic)   Resp 18   Wt 134 lb 4.8 oz (60.9 kg)   BMI 21.68 kg/m   Filed Weights   04/12/18 1405  Weight: 134 lb 4.8 oz (60.9 kg)    Physical Exam  Constitutional:  Frail-appearing Caucasian female patient.  Resting in a wheelchair.  She is accompanied by daughter.  HENT:  Head: Normocephalic and atraumatic.  Mouth/Throat: Oropharynx is clear and moist. No oropharyngeal exudate.  Eyes: Pupils are equal, round, and reactive to light.  Neck: Normal range of motion. Neck supple.  Cardiovascular: Normal rate and regular rhythm.  Pulmonary/Chest: No respiratory distress. She has no wheezes.  Abdominal: Soft. Bowel sounds are normal. She exhibits no distension and no mass. There is no abdominal tenderness. There is no rebound and no guarding.  Musculoskeletal: Normal range of motion.        General: No  tenderness or edema.  Neurological: She is alert.  Oriented 2-3.  Skin: Skin is warm.  Psychiatric: Affect normal.       LABORATORY DATA:  I have reviewed the data as listed    Component Value Date/Time   NA 138 04/12/2018 1330   K 3.6 04/12/2018 1330   CL 105 04/12/2018 1330   CO2 22 04/12/2018 1330   GLUCOSE 107 (H) 04/12/2018 1330   BUN 19 04/12/2018 1330   CREATININE 1.16 (H) 04/12/2018 1330   CALCIUM 8.6 (L) 04/12/2018 1330   CALCIUM 10.1 05/26/2017 0419   PROT 7.3 04/12/2018 1330   ALBUMIN 3.1 (L) 04/12/2018 1330   AST 16 04/12/2018 1330   ALT 12 04/12/2018 1330   ALKPHOS 53 04/12/2018 1330   BILITOT 0.7 04/12/2018 1330   GFRNONAA 43 (L)  04/12/2018 1330   GFRAA 49 (L) 04/12/2018 1330    No results found for: SPEP, UPEP  Lab Results  Component Value Date   WBC 11.9 (H) 04/12/2018   NEUTROABS 10.5 (H) 04/12/2018   HGB 10.2 (L) 04/12/2018   HCT 32.1 (L) 04/12/2018   MCV 94.4 04/12/2018   PLT 423 (H) 04/12/2018      Chemistry      Component Value Date/Time   NA 138 04/12/2018 1330   K 3.6 04/12/2018 1330   CL 105 04/12/2018 1330   CO2 22 04/12/2018 1330   BUN 19 04/12/2018 1330   CREATININE 1.16 (H) 04/12/2018 1330      Component Value Date/Time   CALCIUM 8.6 (L) 04/12/2018 1330   CALCIUM 10.1 05/26/2017 0419   ALKPHOS 53 04/12/2018 1330   AST 16 04/12/2018 1330   ALT 12 04/12/2018 1330   BILITOT 0.7 04/12/2018 1330       RADIOGRAPHIC STUDIES: I have personally reviewed the radiological images as listed and agreed with the findings in the report. No results found.   ASSESSMENT & PLAN:  Multiple myeloma in relapse ([place]) # Multiple myeloma -IgA kappa; symptomatic.  Stage II.  ON dexamethasone 4 mg 3 times a week;Revlimid 5 mg 2 weeks on 1 week off.  M protein decreasing-0.3gm/dl; Jan 2020 M=2.1; K/l= ratio=60 RISING. [Secondary to multiple interruptions recent pneumonia etc.]  # Currently on  Revlimid 5 mg; with dex 4 mg three times/week.    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  # Hypercalcemia/MM- improved on Zometa 85m q 6 weeks.   # DISPOSITION:  # follow up in 4 weeks/labs- cbc/Cmp/MM-k-l light chains/possible Zometa-  Dr.B   No orders of the defined types were placed in this encounter.  All questions were answered. The patient knows to call the clinic with any problems, questions or concerns.      GCammie Sickle MD 04/15/2018 1:41 PM

## 2018-04-15 LAB — MULTIPLE MYELOMA PANEL, SERUM
Albumin SerPl Elph-Mcnc: 2.9 g/dL (ref 2.9–4.4)
Albumin/Glob SerPl: 0.8 (ref 0.7–1.7)
Alpha 1: 0.3 g/dL (ref 0.0–0.4)
Alpha2 Glob SerPl Elph-Mcnc: 0.7 g/dL (ref 0.4–1.0)
B-GLOBULIN SERPL ELPH-MCNC: 2.4 g/dL — AB (ref 0.7–1.3)
Gamma Glob SerPl Elph-Mcnc: 0.5 g/dL (ref 0.4–1.8)
Globulin, Total: 3.9 g/dL (ref 2.2–3.9)
IgA: 3491 mg/dL — ABNORMAL HIGH (ref 64–422)
IgG (Immunoglobin G), Serum: 285 mg/dL — ABNORMAL LOW (ref 700–1600)
IgM (Immunoglobulin M), Srm: <5 mg/dL — ABNORMAL LOW (ref 26–217)
M Protein SerPl Elph-Mcnc: 1.5 g/dL — ABNORMAL HIGH
Total Protein ELP: 6.8 g/dL (ref 6.0–8.5)

## 2018-04-15 LAB — KAPPA/LAMBDA LIGHT CHAINS
Kappa free light chain: 203.9 mg/L — ABNORMAL HIGH (ref 3.3–19.4)
Kappa, lambda light chain ratio: 32.89 — ABNORMAL HIGH (ref 0.26–1.65)
Lambda free light chains: 6.2 mg/L (ref 5.7–26.3)

## 2018-04-17 ENCOUNTER — Other Ambulatory Visit: Payer: Self-pay | Admitting: *Deleted

## 2018-04-17 DIAGNOSIS — C9002 Multiple myeloma in relapse: Secondary | ICD-10-CM

## 2018-04-17 MED ORDER — LENALIDOMIDE 5 MG PO CAPS: 5.0000 mg | ORAL_CAPSULE | Freq: Every day | ORAL | 0 refills | Status: DC

## 2018-04-19 ENCOUNTER — Telehealth: Payer: Self-pay | Admitting: *Deleted

## 2018-04-19 NOTE — Telephone Encounter (Signed)
Oral Chemotherapy Pharmacist Encounter   I called [employer] to check on the status of the Revlimid prescription Heather sent in on 3/25. They stated they were still processing the prescription. They representative is suppose to call me with additional information once he investigates some more.   I called the patient's daughter Max Sane and share the above information. I also provided Ms. York with the telephone number to [employer] [phone]) so she can call them directly, if needed. I will call her back when I hear from [employer].   Darl Pikes, PharmD, BCPS, [facility] Hematology/Oncology Clinical Pharmacist [facility]/HP/AP [facility] [phone]  04/19/2018 1:26 PM

## 2018-04-19 NOTE — Telephone Encounter (Signed)
Patient still has not receivedcall regarding Revlimid shipment and has not received medicine either

## 2018-05-07 ENCOUNTER — Other Ambulatory Visit: Payer: Self-pay | Admitting: *Deleted

## 2018-05-07 DIAGNOSIS — C9002 Multiple myeloma in relapse: Secondary | ICD-10-CM

## 2018-05-07 MED ORDER — LENALIDOMIDE 5 MG PO CAPS: 5.0000 mg | ORAL_CAPSULE | Freq: Every day | ORAL | 0 refills | Status: DC

## 2018-05-09 ENCOUNTER — Telehealth: Payer: Self-pay | Admitting: *Deleted

## 2018-05-09 NOTE — Telephone Encounter (Signed)
Daughter to discuss her mother's care. Patient is scheduled for the visit tomorrow with Dr. Rogue Bussing. Please contact daughter once patient is ready to see Dr. B at [phone].   While I had the daughter on the phone, I reconciled the patient's medications with daughter while on the phone.

## 2018-05-10 ENCOUNTER — Encounter: Payer: Self-pay | Admitting: Internal Medicine

## 2018-05-10 ENCOUNTER — Other Ambulatory Visit: Payer: Self-pay | Admitting: *Deleted

## 2018-05-10 ENCOUNTER — Inpatient Hospital Stay: Payer: PPO | Attending: Internal Medicine

## 2018-05-10 ENCOUNTER — Other Ambulatory Visit: Payer: Self-pay

## 2018-05-10 ENCOUNTER — Inpatient Hospital Stay: Payer: PPO

## 2018-05-10 ENCOUNTER — Inpatient Hospital Stay (HOSPITAL_BASED_OUTPATIENT_CLINIC_OR_DEPARTMENT_OTHER): Payer: PPO | Admitting: Internal Medicine

## 2018-05-10 VITALS — BP 134/83 | HR 85 | Temp 98.9°F | Resp 18 | Ht 66.0 in | Wt 134.0 lb

## 2018-05-10 DIAGNOSIS — I129 Hypertensive chronic kidney disease with stage 1 through stage 4 chronic kidney disease, or unspecified chronic kidney disease: Secondary | ICD-10-CM | POA: Insufficient documentation

## 2018-05-10 DIAGNOSIS — Z79899 Other long term (current) drug therapy: Secondary | ICD-10-CM | POA: Insufficient documentation

## 2018-05-10 DIAGNOSIS — D630 Anemia in neoplastic disease: Secondary | ICD-10-CM

## 2018-05-10 DIAGNOSIS — N183 Chronic kidney disease, stage 3 (moderate): Secondary | ICD-10-CM

## 2018-05-10 DIAGNOSIS — C9002 Multiple myeloma in relapse: Secondary | ICD-10-CM

## 2018-05-10 LAB — CBC WITH DIFFERENTIAL/PLATELET
Abs Immature Granulocytes: 0.08 10*3/uL — ABNORMAL HIGH (ref 0.00–0.07)
Basophils Absolute: 0.1 10*3/uL (ref 0.0–0.1)
Basophils Relative: 0 %
Eosinophils Absolute: 0.1 10*3/uL (ref 0.0–0.5)
Eosinophils Relative: 1 %
HCT: 32.7 % — ABNORMAL LOW (ref 36.0–46.0)
Hemoglobin: 10.1 g/dL — ABNORMAL LOW (ref 12.0–15.0)
Immature Granulocytes: 1 %
Lymphocytes Relative: 8 %
Lymphs Abs: 1 10*3/uL (ref 0.7–4.0)
MCH: 28.7 pg (ref 26.0–34.0)
MCHC: 30.9 g/dL (ref 30.0–36.0)
MCV: 92.9 fL (ref 80.0–100.0)
Monocytes Absolute: 1.1 10*3/uL — ABNORMAL HIGH (ref 0.1–1.0)
Monocytes Relative: 9 %
Neutro Abs: 10.6 10*3/uL — ABNORMAL HIGH (ref 1.7–7.7)
Neutrophils Relative %: 81 %
Platelets: 383 10*3/uL (ref 150–400)
RBC: 3.52 MIL/uL — ABNORMAL LOW (ref 3.87–5.11)
RDW: 15.4 % (ref 11.5–15.5)
WBC: 13 10*3/uL — ABNORMAL HIGH (ref 4.0–10.5)
nRBC: 0 % (ref 0.0–0.2)

## 2018-05-10 LAB — COMPREHENSIVE METABOLIC PANEL
ALT: 14 U/L (ref 0–44)
AST: 15 U/L (ref 15–41)
Albumin: 3.1 g/dL — ABNORMAL LOW (ref 3.5–5.0)
Alkaline Phosphatase: 53 U/L (ref 38–126)
Anion gap: 8 (ref 5–15)
BUN: 22 mg/dL (ref 8–23)
CO2: 25 mmol/L (ref 22–32)
Calcium: 8.5 mg/dL — ABNORMAL LOW (ref 8.9–10.3)
Chloride: 104 mmol/L (ref 98–111)
Creatinine, Ser: 1.34 mg/dL — ABNORMAL HIGH (ref 0.44–1.00)
GFR calc Af Amer: 41 mL/min — ABNORMAL LOW (ref >60)
GFR calc non Af Amer: 36 mL/min — ABNORMAL LOW (ref >60)
Glucose, Bld: 88 mg/dL (ref 70–99)
Potassium: 3.5 mmol/L (ref 3.5–5.1)
Sodium: 137 mmol/L (ref 135–145)
Total Bilirubin: 0.7 mg/dL (ref 0.3–1.2)
Total Protein: 7.7 g/dL (ref 6.5–8.1)

## 2018-05-10 MED ORDER — ZOLEDRONIC ACID 4 MG/5ML IV CONC
3.0000 mg | Freq: Once | INTRAVENOUS | Status: AC
  Administered 2018-05-10: 3 mg via INTRAVENOUS
  Filled 2018-05-10: qty 3.75

## 2018-05-10 MED ORDER — SODIUM CHLORIDE 0.9 % IV SOLN
Freq: Once | INTRAVENOUS | Status: AC
  Administered 2018-05-10: 12:00:00 via INTRAVENOUS
  Filled 2018-05-10: qty 250

## 2018-05-10 NOTE — Assessment & Plan Note (Addendum)
#  Multiple myeloma -IgA kappa; symptomatic.  Stage II.  ON dexamethasone 4 mg 3 times a week;Revlimid 5 mg 2 weeks on 1 week off.   #Partial response/improving M protein.  Continue Revlimid 5 mg 2w/1w; with dex 4 mg three times/week.   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   # Hypercalcemia/MM- improved on Zometa 3mg q 4-8 weeks.   #Discussed with the patient's daughter Theresa Lynch.  # DISPOSITION:  # Zometa today # follow up in May 18th - MD VIDEO /labs- cbc/Cmp/MM-k-l light chains-  Dr.B 

## 2018-05-10 NOTE — Progress Notes (Signed)
[name]  Patient Care Team: Theresa Body, MD as PCP - General (Family Medicine)  Cancer Staging No matching staging information was found for the patient.   Oncology History   # MAY 2019- MULTIPLE MYELOMA [No BMBx]; IgA-K [baseline- 3.4 gm/dl; ]  # Hypercalcemia- s/p Zometa 63m IVP [May 2019]  # Mod dementia; s/p Left hip Fx [may 2019]  ----------------------------------------------------------------------  DIAGNOSIS: [MAY 2019]- ACTIVE MULTIPLE MYELOMA  STAGE:  II  ; GOALS: PALLIATIVE  CURRENT/MOST RECENT THERAPY: Dex 460m3/w; Rev 71m96mw-On/1w-OFF      Multiple myeloma in relapse ([facility]    INTERVAL HISTORY:  Theresa Lynch 26o.  female pleasant patient above history of dementia; also history of multiple myeloma most recently on Revlimid dexamethasone is here for follow-up.  Patient denies any nausea vomiting.  Appetite is good.  No swelling there is no rash.   Review of Systems  Constitutional: Positive for malaise/fatigue. Negative for chills, diaphoresis, fever and weight loss.  HENT: Negative for nosebleeds and sore throat.   Eyes: Negative for double vision.  Respiratory: Negative for cough, hemoptysis, sputum production, shortness of breath and wheezing.   Cardiovascular: Negative for chest pain, palpitations and orthopnea.  Gastrointestinal: Negative for abdominal pain, blood in stool, constipation, diarrhea, heartburn, melena, nausea and vomiting.  Genitourinary: Negative for dysuria, frequency and urgency.  Musculoskeletal: Negative for back pain and joint pain.  Neurological: Negative for dizziness, tingling, focal weakness, weakness and headaches.  Endo/Heme/Allergies: Does not bruise/bleed easily.  Psychiatric/Behavioral: Positive for memory loss. Negative for depression. The patient is not nervous/anxious and does not have insomnia.       PAST MEDICAL HISTORY :  Past Medical History:  Diagnosis  Date  . Arthritis    hands  . Cataract   . Claustrophobia   . Depression    with anxiety/h/o agoraphobia  . Glaucoma   . Hip fracture ([place]  left hip  . HOH (hard of hearing)   . Hyperlipidemia   . Hypertension   . Multiple myeloma ([facility] . PONV (postoperative nausea and vomiting)     PAST SURGICAL HISTORY :   Past Surgical History:  Procedure Laterality Date  . CATARACT EXTRACTION W/PHACO Right 03/15/2015   Procedure: CATARACT EXTRACTION PHACO AND INTRAOCULAR LENS PLACEMENT (IOC);  Surgeon: AniRonnell FreshwaterD;  [place];  Laterality: Right;  . HIP SURGERY    . INTRAMEDULLARY (IM) NAIL INTERTROCHANTERIC Left 05/25/2017   Procedure: INTRAMEDULLARY (IM) NAIL INTERTROCHANTRIC;  Surgeon: KraThornton ParkD;  Location: [facility] ORS;  Service: Orthopedics;  Laterality: Left;  . ROTATOR CUFF REPAIR  1999   bilateral    FAMILY HISTORY :   Family History  Problem Relation Age of Onset  . Cancer - Other Son        appendix cancer  . Prostate cancer Brother   . CAD Brother   . Kidney cancer Father   . Breast cancer Sister   . Stroke Sister     SOCIAL HISTORY:   Social History   Tobacco Use  . Smoking status: Never Smoker  . Smokeless tobacco: Never Used  Substance Use Topics  . Alcohol use: Yes    Alcohol/week: 7.0 standard drinks    Types: 7 Glasses of wine per week  . Drug use: No    ALLERGIES:  is allergic to codeine and penicillins.  MEDICATIONS:  Current Outpatient Medications  Medication Sig  Dispense Refill  . amLODipine (NORVASC) 5 MG tablet Take 1 tablet (5 mg total) by mouth daily. 30 tablet 0  . aspirin EC 81 MG tablet Take 1 tablet (81 mg total) by mouth daily. 30 tablet 1  . atorvastatin (LIPITOR) 10 MG tablet Take 5 mg by mouth daily.     . calcium gluconate 500 MG tablet Take 1 tablet by mouth 2 (two) times daily.    . cholecalciferol (VITAMIN D3) 25 MCG (1000 UT) tablet Take 2,000 Units by mouth 2  (two) times daily.     Marland Kitchen dexamethasone (DECADRON) 4 MG tablet TAKE ONE TABLET THREE TIMES A WEEK 12 tablet 4  . levothyroxine (SYNTHROID, LEVOTHROID) 25 MCG tablet Take 1 tablet (25 mcg total) by mouth daily before breakfast. 30 tablet 1  . PARoxetine (PAXIL) 40 MG tablet Take 40 mg by mouth at bedtime.    . polyethylene glycol (MIRALAX / GLYCOLAX) packet Take 17 g by mouth daily. 14 each 0  . traZODone (DESYREL) 50 MG tablet Take 25 mg by mouth at bedtime.     . vitamin B-12 (CYANOCOBALAMIN) 1000 MCG tablet Take 1,000 mcg by mouth daily.    Marland Kitchen lenalidomide (REVLIMID) 5 MG capsule Take 1 capsule (5 mg total) by mouth daily. Take for 14 days on, then 7 days off. (Patient not taking: Reported on 05/10/2018) 14 capsule 0  . traMADol (ULTRAM) 50 MG tablet Take 1 tablet (50 mg total) by mouth every 6 (six) hours as needed for moderate pain or severe pain. (Patient not taking: Reported on 05/10/2018) 30 tablet 0   No current facility-administered medications for this visit.     PHYSICAL EXAMINATION: ECOG PERFORMANCE STATUS: 1 - Symptomatic but completely ambulatory  BP 134/83   Pulse 85   Temp 98.9 F (37.2 C) (Tympanic)   Resp 18   Ht '5\' 6"'  (1.676 m)   Wt 134 lb (60.8 kg)   BMI 21.63 kg/m   Filed Weights   05/10/18 1128  Weight: 134 lb (60.8 kg)    Physical Exam  Constitutional:  Theresa Lynch Caucasian female patient.  Resting in a wheelchair.  She is alone.   HENT:  Head: Normocephalic and atraumatic.  Mouth/Throat: Oropharynx is clear and moist. No oropharyngeal exudate.  Eyes: Pupils are equal, round, and reactive to light.  Neck: Normal range of motion. Neck supple.  Cardiovascular: Normal rate and regular rhythm.  Pulmonary/Chest: No respiratory distress. She has no wheezes.  Abdominal: Soft. Bowel sounds are normal. She exhibits no distension and no mass. There is no abdominal tenderness. There is no rebound and no guarding.  Musculoskeletal: Normal range of motion.         General: No tenderness or edema.  Neurological: She is alert.  Oriented 2-3.  Skin: Skin is warm.  Psychiatric: Affect normal.       LABORATORY DATA:  I have reviewed the data as listed    Component Value Date/Time   NA 137 05/10/2018 1055   K 3.5 05/10/2018 1055   CL 104 05/10/2018 1055   CO2 25 05/10/2018 1055   GLUCOSE 88 05/10/2018 1055   BUN 22 05/10/2018 1055   CREATININE 1.34 (H) 05/10/2018 1055   CALCIUM 8.5 (L) 05/10/2018 1055   CALCIUM 10.1 05/26/2017 0419   PROT 7.7 05/10/2018 1055   ALBUMIN 3.1 (L) 05/10/2018 1055   AST 15 05/10/2018 1055   ALT 14 05/10/2018 1055   ALKPHOS 53 05/10/2018 1055   BILITOT 0.7 05/10/2018 1055  GFRNONAA 36 (L) 05/10/2018 1055   GFRAA 41 (L) 05/10/2018 1055    No results found for: SPEP, UPEP  Lab Results  Component Value Date   WBC 13.0 (H) 05/10/2018   NEUTROABS 10.6 (H) 05/10/2018   HGB 10.1 (L) 05/10/2018   HCT 32.7 (L) 05/10/2018   MCV 92.9 05/10/2018   PLT 383 05/10/2018      Chemistry      Component Value Date/Time   NA 137 05/10/2018 1055   K 3.5 05/10/2018 1055   CL 104 05/10/2018 1055   CO2 25 05/10/2018 1055   BUN 22 05/10/2018 1055   CREATININE 1.34 (H) 05/10/2018 1055      Component Value Date/Time   CALCIUM 8.5 (L) 05/10/2018 1055   CALCIUM 10.1 05/26/2017 0419   ALKPHOS 53 05/10/2018 1055   AST 15 05/10/2018 1055   ALT 14 05/10/2018 1055   BILITOT 0.7 05/10/2018 1055       RADIOGRAPHIC STUDIES: I have personally reviewed the radiological images as listed and agreed with the findings in the report. No results found.   ASSESSMENT & PLAN:  Multiple myeloma in relapse ([place]) # Multiple myeloma -IgA kappa; symptomatic.  Stage II.  ON dexamethasone 4 mg 3 times a week;Revlimid 5 mg 2 weeks on 1 week off.   #Partial response/improving M protein.  Continue Revlimid 5 mg 2w/1w; with dex 4 mg three times/week.   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    # Hypercalcemia/MM- improved on Zometa 60m q 4-8 weeks.   #Discussed with the patient's daughter SManuela Schwartz  # DISPOSITION:  # Zometa today # follow up in May 18th - MD VIDEO /Reva Bores cbc/Cmp/MM-k-l light chains-  Dr.B   Orders Placed This Encounter  Procedures  . CBC with Differential    Standing Status:   Future    Standing Expiration Date:   05/10/2019  . Comprehensive metabolic panel    Standing Status:   Future    Standing Expiration Date:   05/10/2019  . Multiple Myeloma Panel (SPEP&IFE w/QIG)    Standing Status:   Future    Standing Expiration Date:   05/10/2019  . Kappa/lambda light chains    Standing Status:   Future    Standing Expiration Date:   05/10/2019   All questions were answered. The patient knows to call the clinic with any problems, questions or concerns.      GCammie Sickle MD 05/14/2018 10:54 AM

## 2018-05-13 LAB — KAPPA/LAMBDA LIGHT CHAINS
Kappa free light chain: 194 mg/L — ABNORMAL HIGH (ref 3.3–19.4)
Kappa, lambda light chain ratio: 33.45 — ABNORMAL HIGH (ref 0.26–1.65)
Lambda free light chains: 5.8 mg/L (ref 5.7–26.3)

## 2018-05-13 LAB — MULTIPLE MYELOMA PANEL, SERUM
Albumin SerPl Elph-Mcnc: 3 g/dL (ref 2.9–4.4)
Albumin/Glob SerPl: 0.8 (ref 0.7–1.7)
Alpha 1: 0.3 g/dL (ref 0.0–0.4)
Alpha2 Glob SerPl Elph-Mcnc: 0.8 g/dL (ref 0.4–1.0)
B-Globulin SerPl Elph-Mcnc: 2.5 g/dL — ABNORMAL HIGH (ref 0.7–1.3)
Gamma Glob SerPl Elph-Mcnc: 0.4 g/dL (ref 0.4–1.8)
Globulin, Total: 3.9 g/dL (ref 2.2–3.9)
IgA: 2485 mg/dL — ABNORMAL HIGH (ref 64–422)
IgG (Immunoglobin G), Serum: 263 mg/dL — ABNORMAL LOW (ref 586–1602)
IgM (Immunoglobulin M), Srm: <5 mg/dL — ABNORMAL LOW (ref 26–217)
M Protein SerPl Elph-Mcnc: 1.4 g/dL — ABNORMAL HIGH
Total Protein ELP: 6.9 g/dL (ref 6.0–8.5)

## 2018-06-03 ENCOUNTER — Other Ambulatory Visit: Payer: Self-pay | Admitting: *Deleted

## 2018-06-03 DIAGNOSIS — C9002 Multiple myeloma in relapse: Secondary | ICD-10-CM

## 2018-06-03 MED ORDER — LENALIDOMIDE 5 MG PO CAPS: 5.0000 mg | ORAL_CAPSULE | Freq: Every day | ORAL | 0 refills | Status: DC

## 2018-06-07 ENCOUNTER — Other Ambulatory Visit: Payer: Self-pay

## 2018-06-10 ENCOUNTER — Inpatient Hospital Stay: Payer: PPO | Attending: Internal Medicine

## 2018-06-10 ENCOUNTER — Other Ambulatory Visit: Payer: Self-pay

## 2018-06-10 ENCOUNTER — Inpatient Hospital Stay (HOSPITAL_BASED_OUTPATIENT_CLINIC_OR_DEPARTMENT_OTHER): Payer: PPO | Admitting: Internal Medicine

## 2018-06-10 ENCOUNTER — Encounter: Payer: Self-pay | Admitting: Internal Medicine

## 2018-06-10 DIAGNOSIS — Z79899 Other long term (current) drug therapy: Secondary | ICD-10-CM | POA: Diagnosis not present

## 2018-06-10 DIAGNOSIS — D630 Anemia in neoplastic disease: Secondary | ICD-10-CM | POA: Diagnosis not present

## 2018-06-10 DIAGNOSIS — N183 Chronic kidney disease, stage 3 (moderate): Secondary | ICD-10-CM | POA: Diagnosis not present

## 2018-06-10 DIAGNOSIS — C9002 Multiple myeloma in relapse: Secondary | ICD-10-CM

## 2018-06-10 DIAGNOSIS — D6489 Other specified anemias: Secondary | ICD-10-CM

## 2018-06-10 LAB — CBC WITH DIFFERENTIAL/PLATELET
Abs Immature Granulocytes: 0.04 10*3/uL (ref 0.00–0.07)
Basophils Absolute: 0 10*3/uL (ref 0.0–0.1)
Basophils Relative: 1 %
Eosinophils Absolute: 0.1 10*3/uL (ref 0.0–0.5)
Eosinophils Relative: 1 %
HCT: 32.7 % — ABNORMAL LOW (ref 36.0–46.0)
Hemoglobin: 10 g/dL — ABNORMAL LOW (ref 12.0–15.0)
Immature Granulocytes: 1 %
Lymphocytes Relative: 26 %
Lymphs Abs: 1.9 10*3/uL (ref 0.7–4.0)
MCH: 27.3 pg (ref 26.0–34.0)
MCHC: 30.6 g/dL (ref 30.0–36.0)
MCV: 89.3 fL (ref 80.0–100.0)
Monocytes Absolute: 1.5 10*3/uL — ABNORMAL HIGH (ref 0.1–1.0)
Monocytes Relative: 20 %
Neutro Abs: 3.9 10*3/uL (ref 1.7–7.7)
Neutrophils Relative %: 51 %
Platelets: 331 10*3/uL (ref 150–400)
RBC: 3.66 MIL/uL — ABNORMAL LOW (ref 3.87–5.11)
RDW: 15.9 % — ABNORMAL HIGH (ref 11.5–15.5)
WBC: 7.5 10*3/uL (ref 4.0–10.5)
nRBC: 0 % (ref 0.0–0.2)

## 2018-06-10 LAB — COMPREHENSIVE METABOLIC PANEL
ALT: 12 U/L (ref 0–44)
AST: 15 U/L (ref 15–41)
Albumin: 3.3 g/dL — ABNORMAL LOW (ref 3.5–5.0)
Alkaline Phosphatase: 45 U/L (ref 38–126)
Anion gap: 11 (ref 5–15)
BUN: 23 mg/dL (ref 8–23)
CO2: 23 mmol/L (ref 22–32)
Calcium: 8.6 mg/dL — ABNORMAL LOW (ref 8.9–10.3)
Chloride: 104 mmol/L (ref 98–111)
Creatinine, Ser: 1.13 mg/dL — ABNORMAL HIGH (ref 0.44–1.00)
GFR calc Af Amer: 51 mL/min — ABNORMAL LOW (ref >60)
GFR calc non Af Amer: 44 mL/min — ABNORMAL LOW (ref >60)
Glucose, Bld: 82 mg/dL (ref 70–99)
Potassium: 3.6 mmol/L (ref 3.5–5.1)
Sodium: 138 mmol/L (ref 135–145)
Total Bilirubin: 1.1 mg/dL (ref 0.3–1.2)
Total Protein: 7.3 g/dL (ref 6.5–8.1)

## 2018-06-10 NOTE — Assessment & Plan Note (Addendum)
#  Multiple myeloma -IgA kappa; symptomatic.  Stage II.   # April 2020- M protein- 1.4gm/dl. Partial response/improving M protein.  Continue Revlimid 5 mg 2w/1w; with dex 4 mg three times/week.   # CKD- stage III-1.2/ stable.   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   # Hypercalcemia/MM-stable.  Continue Zometa '3mg'$  q 4-8 weeks.  Encouraged patient to keep appointments next month for Zometa.  # DISPOSITION:  # follow up in June 15th  - MD clinic- Zometa;  labs- cbc/Cmp/MM-k-l light chains- Dr.B

## 2018-06-10 NOTE — Progress Notes (Signed)
I connected with Robinette Esters on 06/10/18 at  1:00 PM EDT by video enabled telemedicine visit and verified that I am speaking with the correct person using two identifiers.  I discussed the limitations, risks, security and privacy concerns of performing an evaluation and management service by telemedicine and the availability of in-person appointments. I also discussed with the patient that there may be a patient responsible charge related to this service. The patient expressed understanding and agreed to proceed.    Other persons participating in the visit and their role in the encounter: Daughter/coordination of care Patient's location: Home Provider's location: Home  Oncology History   # MAY 2019- MULTIPLE MYELOMA [No BMBx]; IgA-K [baseline- 3.4 gm/dl; ]  # Hypercalcemia- s/p Zometa 81m IVP [May 2019]  # Mod dementia; s/p Left hip Fx [may 2019]  ----------------------------------------------------------------------  DIAGNOSIS: [MAY 2019]- ACTIVE MULTIPLE MYELOMA  STAGE:  II  ; GOALS: PALLIATIVE  CURRENT/MOST RECENT THERAPY: Dex 454m3/w; Rev 30m64mw-On/1w-OFF      [facility]   Chief Complaint: Multiple myeloma   History of present illness:Flonnie RebLysha Schrade 54o.  female with history of multiple myeloma currently on Revlimid and dexamethasone is here for follow-up.  Patient appetite is good.  Denies any nausea vomiting diarrhea.  No weight loss as per family.  No swelling in the legs.   Observation/objective: Hemoglobin 10.  Assessment and plan: Multiple myeloma in relapse ([facility] Multiple myeloma -IgA kappa; symptomatic.  Stage II.   # April 2020- M protein- 1.4gm/dl. Partial response/improving M protein.  Continue Revlimid 5 mg 2w/1w; with dex 4 mg three times/week.   # CKD- stage III-1.2/ stable.   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   # Hypercalcemia/MM-stable.  Continue Zometa 3mg86m4-8 weeks.  Encouraged patient to  keep appointments next month for Zometa.  # DISPOSITION:  # follow up in June 15th  - MD clinic- Zometa;  labs- cbc/Cmp/MM-k-l light chains- Dr.B    Follow-up instructions:  I discussed the assessment and treatment plan with the patient.  The patient was provided an opportunity to ask questions and all were answered.  The patient agreed with the plan and demonstrated understanding of instructions.  The patient was advised to call back or seek an in person evaluation if the symptoms worsen or if the condition fails to improve as anticipated.   Dr. GoviCharlaine DaltonCSilver RidgeAlamWilson Digest[facility]8/2020 1:17 PM

## 2018-06-11 LAB — MULTIPLE MYELOMA PANEL, SERUM
Albumin SerPl Elph-Mcnc: 3.2 g/dL (ref 2.9–4.4)
Albumin/Glob SerPl: 0.9 (ref 0.7–1.7)
Alpha 1: 0.2 g/dL (ref 0.0–0.4)
Alpha2 Glob SerPl Elph-Mcnc: 0.8 g/dL (ref 0.4–1.0)
B-Globulin SerPl Elph-Mcnc: 2.2 g/dL — ABNORMAL HIGH (ref 0.7–1.3)
Gamma Glob SerPl Elph-Mcnc: 0.4 g/dL (ref 0.4–1.8)
Globulin, Total: 3.6 g/dL (ref 2.2–3.9)
IgA: 2117 mg/dL — ABNORMAL HIGH (ref 64–422)
IgG (Immunoglobin G), Serum: 276 mg/dL — ABNORMAL LOW (ref 586–1602)
IgM (Immunoglobulin M), Srm: <5 mg/dL — ABNORMAL LOW (ref 26–217)
M Protein SerPl Elph-Mcnc: 1.2 g/dL — ABNORMAL HIGH
Total Protein ELP: 6.8 g/dL (ref 6.0–8.5)

## 2018-06-11 LAB — KAPPA/LAMBDA LIGHT CHAINS
Kappa free light chain: 129.9 mg/L — ABNORMAL HIGH (ref 3.3–19.4)
Kappa, lambda light chain ratio: 39.36 — ABNORMAL HIGH (ref 0.26–1.65)
Lambda free light chains: 3.3 mg/L — ABNORMAL LOW (ref 5.7–26.3)

## 2018-06-17 ENCOUNTER — Other Ambulatory Visit: Payer: Self-pay | Admitting: Internal Medicine

## 2018-06-20 ENCOUNTER — Telehealth: Payer: Self-pay | Admitting: *Deleted

## 2018-06-20 ENCOUNTER — Other Ambulatory Visit: Payer: Self-pay | Admitting: *Deleted

## 2018-06-20 DIAGNOSIS — C9002 Multiple myeloma in relapse: Secondary | ICD-10-CM

## 2018-06-20 MED ORDER — LENALIDOMIDE 5 MG PO CAPS: 5.0000 mg | ORAL_CAPSULE | Freq: Every day | ORAL | 0 refills | Status: DC

## 2018-06-20 NOTE — Telephone Encounter (Signed)
Spoke with daughter. Patient is due for her patient survey for the rems program. Daughter will ensure this gets taken care of. Information also fwd to pt's mychart for daughter.

## 2018-07-04 DIAGNOSIS — N183 Chronic kidney disease, stage 3 (moderate): Secondary | ICD-10-CM | POA: Diagnosis not present

## 2018-07-04 DIAGNOSIS — E7800 Pure hypercholesterolemia, unspecified: Secondary | ICD-10-CM | POA: Diagnosis not present

## 2018-07-04 DIAGNOSIS — I10 Essential (primary) hypertension: Secondary | ICD-10-CM | POA: Diagnosis not present

## 2018-07-04 DIAGNOSIS — D539 Nutritional anemia, unspecified: Secondary | ICD-10-CM | POA: Diagnosis not present

## 2018-07-04 DIAGNOSIS — R7303 Prediabetes: Secondary | ICD-10-CM | POA: Diagnosis not present

## 2018-07-04 DIAGNOSIS — E039 Hypothyroidism, unspecified: Secondary | ICD-10-CM | POA: Diagnosis not present

## 2018-07-04 DIAGNOSIS — Z0000 Encounter for general adult medical examination without abnormal findings: Secondary | ICD-10-CM | POA: Diagnosis not present

## 2018-07-05 ENCOUNTER — Other Ambulatory Visit: Payer: Self-pay | Admitting: *Deleted

## 2018-07-05 DIAGNOSIS — R7303 Prediabetes: Secondary | ICD-10-CM | POA: Diagnosis not present

## 2018-07-05 DIAGNOSIS — C9002 Multiple myeloma in relapse: Secondary | ICD-10-CM

## 2018-07-05 DIAGNOSIS — E7800 Pure hypercholesterolemia, unspecified: Secondary | ICD-10-CM | POA: Diagnosis not present

## 2018-07-05 DIAGNOSIS — D539 Nutritional anemia, unspecified: Secondary | ICD-10-CM | POA: Diagnosis not present

## 2018-07-05 DIAGNOSIS — I10 Essential (primary) hypertension: Secondary | ICD-10-CM | POA: Diagnosis not present

## 2018-07-05 DIAGNOSIS — N183 Chronic kidney disease, stage 3 (moderate): Secondary | ICD-10-CM | POA: Diagnosis not present

## 2018-07-05 DIAGNOSIS — E039 Hypothyroidism, unspecified: Secondary | ICD-10-CM | POA: Diagnosis not present

## 2018-07-08 ENCOUNTER — Other Ambulatory Visit: Payer: Self-pay

## 2018-07-08 ENCOUNTER — Inpatient Hospital Stay: Payer: PPO | Attending: Internal Medicine

## 2018-07-08 ENCOUNTER — Inpatient Hospital Stay: Payer: PPO

## 2018-07-08 ENCOUNTER — Inpatient Hospital Stay (HOSPITAL_BASED_OUTPATIENT_CLINIC_OR_DEPARTMENT_OTHER): Payer: PPO | Admitting: Internal Medicine

## 2018-07-08 DIAGNOSIS — R6889 Other general symptoms and signs: Secondary | ICD-10-CM | POA: Diagnosis not present

## 2018-07-08 DIAGNOSIS — Z79899 Other long term (current) drug therapy: Secondary | ICD-10-CM

## 2018-07-08 DIAGNOSIS — C9002 Multiple myeloma in relapse: Secondary | ICD-10-CM | POA: Insufficient documentation

## 2018-07-08 DIAGNOSIS — D638 Anemia in other chronic diseases classified elsewhere: Secondary | ICD-10-CM | POA: Diagnosis not present

## 2018-07-08 DIAGNOSIS — Z7982 Long term (current) use of aspirin: Secondary | ICD-10-CM

## 2018-07-08 DIAGNOSIS — N183 Chronic kidney disease, stage 3 (moderate): Secondary | ICD-10-CM | POA: Diagnosis not present

## 2018-07-08 DIAGNOSIS — I10 Essential (primary) hypertension: Secondary | ICD-10-CM | POA: Insufficient documentation

## 2018-07-08 DIAGNOSIS — E785 Hyperlipidemia, unspecified: Secondary | ICD-10-CM

## 2018-07-08 DIAGNOSIS — F329 Major depressive disorder, single episode, unspecified: Secondary | ICD-10-CM | POA: Insufficient documentation

## 2018-07-08 DIAGNOSIS — F0390 Unspecified dementia without behavioral disturbance: Secondary | ICD-10-CM | POA: Diagnosis not present

## 2018-07-08 LAB — CBC WITH DIFFERENTIAL/PLATELET
Abs Immature Granulocytes: 0.09 10*3/uL — ABNORMAL HIGH (ref 0.00–0.07)
Basophils Absolute: 0.1 10*3/uL (ref 0.0–0.1)
Basophils Relative: 1 %
Eosinophils Absolute: 0.1 10*3/uL (ref 0.0–0.5)
Eosinophils Relative: 1 %
HCT: 32.9 % — ABNORMAL LOW (ref 36.0–46.0)
Hemoglobin: 10.1 g/dL — ABNORMAL LOW (ref 12.0–15.0)
Immature Granulocytes: 1 %
Lymphocytes Relative: 16 %
Lymphs Abs: 1.8 10*3/uL (ref 0.7–4.0)
MCH: 27.2 pg (ref 26.0–34.0)
MCHC: 30.7 g/dL (ref 30.0–36.0)
MCV: 88.4 fL (ref 80.0–100.0)
Monocytes Absolute: 1.1 10*3/uL — ABNORMAL HIGH (ref 0.1–1.0)
Monocytes Relative: 10 %
Neutro Abs: 7.9 10*3/uL — ABNORMAL HIGH (ref 1.7–7.7)
Neutrophils Relative %: 71 %
Platelets: 451 10*3/uL — ABNORMAL HIGH (ref 150–400)
RBC: 3.72 MIL/uL — ABNORMAL LOW (ref 3.87–5.11)
RDW: 16.9 % — ABNORMAL HIGH (ref 11.5–15.5)
WBC: 11 10*3/uL — ABNORMAL HIGH (ref 4.0–10.5)
nRBC: 0 % (ref 0.0–0.2)

## 2018-07-08 LAB — COMPREHENSIVE METABOLIC PANEL
ALT: 17 U/L (ref 0–44)
AST: 15 U/L (ref 15–41)
Albumin: 3.3 g/dL — ABNORMAL LOW (ref 3.5–5.0)
Alkaline Phosphatase: 47 U/L (ref 38–126)
Anion gap: 10 (ref 5–15)
BUN: 23 mg/dL (ref 8–23)
CO2: 23 mmol/L (ref 22–32)
Calcium: 8.7 mg/dL — ABNORMAL LOW (ref 8.9–10.3)
Chloride: 106 mmol/L (ref 98–111)
Creatinine, Ser: 1.49 mg/dL — ABNORMAL HIGH (ref 0.44–1.00)
GFR calc Af Amer: 36 mL/min — ABNORMAL LOW (ref >60)
GFR calc non Af Amer: 31 mL/min — ABNORMAL LOW (ref >60)
Glucose, Bld: 95 mg/dL (ref 70–99)
Potassium: 3.9 mmol/L (ref 3.5–5.1)
Sodium: 139 mmol/L (ref 135–145)
Total Bilirubin: 1.1 mg/dL (ref 0.3–1.2)
Total Protein: 7 g/dL (ref 6.5–8.1)

## 2018-07-08 MED ORDER — ZOLEDRONIC ACID 4 MG/5ML IV CONC
3.0000 mg | Freq: Once | INTRAVENOUS | Status: AC
  Administered 2018-07-08: 3 mg via INTRAVENOUS
  Filled 2018-07-08: qty 3.75

## 2018-07-08 MED ORDER — SODIUM CHLORIDE 0.9 % IV SOLN
INTRAVENOUS | Status: DC
  Administered 2018-07-08: 12:00:00 via INTRAVENOUS
  Filled 2018-07-08: qty 250

## 2018-07-08 NOTE — Assessment & Plan Note (Addendum)
#  Multiple myeloma -IgA kappa; symptomatic.  Stage II.   # May 2020 M protein- 1.2gm/dl.  Partial response; improving.  Continue Revlimid 5 mg 2w/1w; with dex 4 mg three times/week.   # CKD- stage III-1.49; monitor closely; drink fluids.   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   # Hypercalcemia/MM-stable.  Continue Zometa '3mg'$  q 4-8 weeks.  Proceed with Zometa today.  # Dry eyes-continue eye moisturizers.  #Right arm above elbow soft tissue mass monitor closely.  Unclear.  Not suspicious for myeloma.  #Discussed with the patient's daughter Theresa Lynch over the phone.  # DISPOSITION:  # Zometa today.  # follow up in July 6th MD clinic-labs- cbc/Cmp/MM-k-l light chains- Dr.B

## 2018-07-08 NOTE — Progress Notes (Signed)
[place] OFFICE PROGRESS NOTE  Patient Care Team: [name], MD as PCP - General (Family Medicine)  Cancer Staging No matching staging information was found for the patient.   Oncology History Overview Note  # MAY 2019- MULTIPLE MYELOMA [No BMBx]; IgA-K [baseline- 3.4 gm/dl; ]  # Hypercalcemia- s/p Zometa 83m IVP [May 2019]  # Mod dementia; s/p Left hip Fx [may 2019]  ----------------------------------------------------------------------  DIAGNOSIS: [MAY 2019]- ACTIVE MULTIPLE MYELOMA  STAGE:  II  ; GOALS: PALLIATIVE  CURRENT/MOST RECENT THERAPY: Dex 449m3/w; Rev 48m248mw-On/1w-OFF    Multiple [place]    INTERVAL HISTORY:  Theresa Lynch 63o.  female pleasant patient above history of dementia; also history of multiple myeloma most recently on Revlimid dexamethasone is here for follow-up.  No fever no chills.  No nausea no vomiting.  Appetite is fair.  No weight loss.  Family noted to have a lump right arm above the elbow.  Not painful.  Review of Systems  Constitutional: Positive for malaise/fatigue. Negative for chills, diaphoresis, fever and weight loss.  HENT: Negative for nosebleeds and sore throat.   Eyes: Negative for double vision.  Respiratory: Negative for cough, hemoptysis, sputum production, shortness of breath and wheezing.   Cardiovascular: Negative for chest pain, palpitations and orthopnea.  Gastrointestinal: Negative for abdominal pain, blood in stool, constipation, diarrhea, heartburn, melena, nausea and vomiting.  Genitourinary: Negative for dysuria, frequency and urgency.  Musculoskeletal: Negative for back pain and joint pain.  Neurological: Negative for dizziness, tingling, focal weakness, weakness and headaches.  Endo/Heme/Allergies: Does not bruise/bleed easily.  Psychiatric/Behavioral: Positive for memory loss. Negative for depression. The patient is not nervous/anxious and does not have  insomnia.       PAST MEDICAL HISTORY :  Past Medical History:  Diagnosis Date  . Arthritis    hands  . Cataract   . Claustrophobia   . Depression    with anxiety/h/o agoraphobia  . Glaucoma   . [place] ([facility]  [place] hip  . HOH (hard of hearing)   . Hyperlipidemia   . Hypertension   . Multiple [place] ([place] . PONV (postoperative nausea and vomiting)     PAST SURGICAL HISTORY :   Past Surgical History:  Procedure Laterality Date  . CATARACT EXTRACTION W/PHACO Right 03/15/2015   Procedure: CATARACT EXTRACTION PHACO AND INTRAOCULAR LENS PLACEMENT (IOC);  Surgeon: AniRonnell FreshwaterD;  [facility];  Laterality: Right;  . HIP SURGERY    . INTRAMEDULLARY (IM) NAIL INTERTROCHANTERIC Left 05/25/2017   Procedure: INTRAMEDULLARY (IM) NAIL INTERTROCHANTRIC;  Surgeon: KraThornton ParkD;  Location: [facility] ORS;  Service: Orthopedics;  Laterality: Left;  . ROTATOR CUFF REPAIR  1999   bilateral    FAMILY HISTORY :   Family History  Problem Relation Age of Onset  . Cancer - Other Son        appendix cancer  . Prostate cancer Brother   . CAD Brother   . Kidney cancer Father   . Breast cancer Sister   . Stroke Sister     SOCIAL HISTORY:   Social History   Tobacco Use  . Smoking status: Never Smoker  . Smokeless tobacco: Never Used  Substance Use Topics  . Alcohol use: Yes    Alcohol/week: 7.0 standard drinks    Types: 7 Glasses of wine per week  . Drug use: No    ALLERGIES:  is  allergic to codeine and penicillins.  MEDICATIONS:  Current Outpatient Medications  Medication Sig Dispense Refill  . amLODipine (NORVASC) 5 MG tablet Take 1 tablet (5 mg total) by mouth daily. 30 tablet 0  . aspirin EC 81 MG tablet Take 1 tablet (81 mg total) by mouth daily. 30 tablet 1  . atorvastatin (LIPITOR) 10 MG tablet Take 5 mg by mouth daily.     . calcium gluconate 500 MG tablet Take 1 tablet by mouth 2 (two) times daily.    .  cholecalciferol (VITAMIN D3) 25 MCG (1000 UT) tablet Take 2,000 Units by mouth 2 (two) times daily.     Marland Kitchen dexamethasone (DECADRON) 4 MG tablet TAKE ONE TABLET THREE TIMES A WEEK 12 tablet 4  . lenalidomide (REVLIMID) 5 MG capsule Take 1 capsule (5 mg total) by mouth daily. Take for 14 days on, then 7 days off. 14 capsule 0  . levothyroxine (SYNTHROID, LEVOTHROID) 25 MCG tablet Take 1 tablet (25 mcg total) by mouth daily before breakfast. 30 tablet 1  . PARoxetine (PAXIL) 40 MG tablet Take 40 mg by mouth at bedtime.    . polyethylene glycol (MIRALAX / GLYCOLAX) packet Take 17 g by mouth daily. 14 each 0  . traMADol (ULTRAM) 50 MG tablet Take 1 tablet (50 mg total) by mouth every 6 (six) hours as needed for moderate pain or severe pain. 30 tablet 0  . traZODone (DESYREL) 50 MG tablet Take 25 mg by mouth at bedtime.     . vitamin B-12 (CYANOCOBALAMIN) 1000 MCG tablet Take 1,000 mcg by mouth daily.     No current facility-administered medications for this visit.    Facility-Administered Medications Ordered in Other Visits  Medication Dose Route Frequency Provider Last Rate Last Dose  . 0.9 %  sodium chloride infusion   Intravenous Continuous Cammie Sickle, MD 10 mL/hr at 07/08/18 1132      PHYSICAL EXAMINATION: ECOG PERFORMANCE STATUS: 1 - Symptomatic but completely ambulatory  BP (!) 144/75   Pulse 80   Temp 99.2 F (37.3 C)   Resp 16   Wt 142 lb 8 oz (64.6 kg)   BMI 23.00 kg/m   Filed Weights   07/08/18 1038  Weight: 142 lb 8 oz (64.6 kg)    Physical Exam  Constitutional:  Frail-appearing Caucasian female patient.  Resting in a wheelchair.  She is alone.   HENT:  Head: Normocephalic and atraumatic.  Mouth/Throat: Oropharynx is clear and moist. No oropharyngeal exudate.  Eyes: Pupils are equal, round, and reactive to light.  Neck: Normal range of motion. Neck supple.  Cardiovascular: Normal rate and regular rhythm.  Pulmonary/Chest: No respiratory distress. She has no  wheezes.  Abdominal: Soft. Bowel sounds are normal. She exhibits no distension and no mass. There is no abdominal tenderness. There is no rebound and no guarding.  Musculoskeletal: Normal range of motion.        General: No tenderness or edema.     Comments: 3 to 4 cm soft tissue mass noted above the elbow/right arm.  Nontender.  Soft.  Not mobile.  Neurological: She is alert.  Oriented 2-3.  Skin: Skin is warm.  Psychiatric: Affect normal.       LABORATORY DATA:  I have reviewed the data as listed    Component Value Date/Time   NA 139 07/08/2018 1001   K 3.9 07/08/2018 1001   CL 106 07/08/2018 1001   CO2 23 07/08/2018 1001   GLUCOSE 95 07/08/2018 1001  BUN 23 07/08/2018 1001   CREATININE 1.49 (H) 07/08/2018 1001   CALCIUM 8.7 (L) 07/08/2018 1001   CALCIUM 10.1 05/26/2017 0419   PROT 7.0 07/08/2018 1001   ALBUMIN 3.3 (L) 07/08/2018 1001   AST 15 07/08/2018 1001   ALT 17 07/08/2018 1001   ALKPHOS 47 07/08/2018 1001   BILITOT 1.1 07/08/2018 1001   GFRNONAA 31 (L) 07/08/2018 1001   GFRAA 36 (L) 07/08/2018 1001    No results found for: SPEP, UPEP  Lab Results  Component Value Date   WBC 11.0 (H) 07/08/2018   NEUTROABS 7.9 (H) 07/08/2018   HGB 10.1 (L) 07/08/2018   HCT 32.9 (L) 07/08/2018   MCV 88.4 07/08/2018   PLT 451 (H) 07/08/2018      Chemistry      Component Value Date/Time   NA 139 07/08/2018 1001   K 3.9 07/08/2018 1001   CL 106 07/08/2018 1001   CO2 23 07/08/2018 1001   BUN 23 07/08/2018 1001   CREATININE 1.49 (H) 07/08/2018 1001      Component Value Date/Time   CALCIUM 8.7 (L) 07/08/2018 1001   CALCIUM 10.1 05/26/2017 0419   ALKPHOS 47 07/08/2018 1001   AST 15 07/08/2018 1001   ALT 17 07/08/2018 1001   BILITOT 1.1 07/08/2018 1001       RADIOGRAPHIC STUDIES: I have personally reviewed the radiological images as listed and agreed with the findings in the report. No results found.   ASSESSMENT & PLAN:  Multiple myeloma in relapse ([place]) #  Multiple myeloma -IgA kappa; symptomatic.  Stage II.   # May 2020 M protein- 1.2gm/dl.  Partial response; improving.  Continue Revlimid 5 mg 2w/1w; with dex 4 mg three times/week.   # CKD- stage III-1.49; monitor closely; drink fluids.   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   # Hypercalcemia/MM-stable.  Continue Zometa 24m q 4-8 weeks.  Proceed with Zometa today.  # Dry eyes-continue eye moisturizers.  #Right arm above elbow soft tissue mass monitor closely.  Unclear.  Not suspicious for myeloma.  #Discussed with the patient's daughter SManuela Schwartzover the phone.  # DISPOSITION:  # Zometa today.  # follow up in July 6th MD clinic-labs- cbc/Cmp/MM-k-l light chains- Dr.B   No orders of the defined types were placed in this encounter.  All questions were answered. The patient knows to call the clinic with any problems, questions or concerns.      GCammie Sickle MD 07/08/2018 12:45 PM

## 2018-07-08 NOTE — Progress Notes (Signed)
Patient does not offer any problems today.  

## 2018-07-09 LAB — MULTIPLE MYELOMA PANEL, SERUM
Albumin SerPl Elph-Mcnc: 3.2 g/dL (ref 2.9–4.4)
Albumin/Glob SerPl: 1 (ref 0.7–1.7)
Alpha 1: 0.2 g/dL (ref 0.0–0.4)
Alpha2 Glob SerPl Elph-Mcnc: 0.8 g/dL (ref 0.4–1.0)
B-Globulin SerPl Elph-Mcnc: 2.2 g/dL — ABNORMAL HIGH (ref 0.7–1.3)
Gamma Glob SerPl Elph-Mcnc: 0.3 g/dL — ABNORMAL LOW (ref 0.4–1.8)
Globulin, Total: 3.5 g/dL (ref 2.2–3.9)
IgA: 1783 mg/dL — ABNORMAL HIGH (ref 64–422)
IgG (Immunoglobin G), Serum: 276 mg/dL — ABNORMAL LOW (ref 586–1602)
IgM (Immunoglobulin M), Srm: <5 mg/dL — ABNORMAL LOW (ref 26–217)
M Protein SerPl Elph-Mcnc: 1.1 g/dL — ABNORMAL HIGH
Total Protein ELP: 6.7 g/dL (ref 6.0–8.5)

## 2018-07-09 LAB — KAPPA/LAMBDA LIGHT CHAINS
Kappa free light chain: 69.9 mg/L — ABNORMAL HIGH (ref 3.3–19.4)
Kappa, lambda light chain ratio: 15.53 — ABNORMAL HIGH (ref 0.26–1.65)
Lambda free light chains: 4.5 mg/L — ABNORMAL LOW (ref 5.7–26.3)

## 2018-07-11 ENCOUNTER — Other Ambulatory Visit: Payer: Self-pay | Admitting: *Deleted

## 2018-07-11 DIAGNOSIS — C9002 Multiple myeloma in relapse: Secondary | ICD-10-CM

## 2018-07-11 MED ORDER — LENALIDOMIDE 5 MG PO CAPS: 5.0000 mg | ORAL_CAPSULE | Freq: Every day | ORAL | 0 refills | Status: DC

## 2018-07-25 ENCOUNTER — Other Ambulatory Visit: Payer: Self-pay

## 2018-07-29 ENCOUNTER — Other Ambulatory Visit: Payer: Self-pay

## 2018-07-29 ENCOUNTER — Other Ambulatory Visit: Payer: Self-pay | Admitting: *Deleted

## 2018-07-29 ENCOUNTER — Inpatient Hospital Stay: Payer: PPO | Attending: Internal Medicine

## 2018-07-29 ENCOUNTER — Inpatient Hospital Stay (HOSPITAL_BASED_OUTPATIENT_CLINIC_OR_DEPARTMENT_OTHER): Payer: PPO | Admitting: Internal Medicine

## 2018-07-29 VITALS — BP 127/76 | HR 83 | Temp 97.8°F | Resp 20 | Ht 66.0 in | Wt 143.2 lb

## 2018-07-29 DIAGNOSIS — N183 Chronic kidney disease, stage 3 (moderate): Secondary | ICD-10-CM | POA: Insufficient documentation

## 2018-07-29 DIAGNOSIS — I129 Hypertensive chronic kidney disease with stage 1 through stage 4 chronic kidney disease, or unspecified chronic kidney disease: Secondary | ICD-10-CM

## 2018-07-29 DIAGNOSIS — D631 Anemia in chronic kidney disease: Secondary | ICD-10-CM | POA: Diagnosis not present

## 2018-07-29 DIAGNOSIS — C9002 Multiple myeloma in relapse: Secondary | ICD-10-CM

## 2018-07-29 DIAGNOSIS — Z79899 Other long term (current) drug therapy: Secondary | ICD-10-CM | POA: Insufficient documentation

## 2018-07-29 LAB — COMPREHENSIVE METABOLIC PANEL
ALT: 16 U/L (ref 0–44)
AST: 16 U/L (ref 15–41)
Albumin: 3.5 g/dL (ref 3.5–5.0)
Alkaline Phosphatase: 51 U/L (ref 38–126)
Anion gap: 12 (ref 5–15)
BUN: 25 mg/dL — ABNORMAL HIGH (ref 8–23)
CO2: 23 mmol/L (ref 22–32)
Calcium: 8.8 mg/dL — ABNORMAL LOW (ref 8.9–10.3)
Chloride: 105 mmol/L (ref 98–111)
Creatinine, Ser: 1.28 mg/dL — ABNORMAL HIGH (ref 0.44–1.00)
GFR calc Af Amer: 44 mL/min — ABNORMAL LOW (ref >60)
GFR calc non Af Amer: 38 mL/min — ABNORMAL LOW (ref >60)
Glucose, Bld: 109 mg/dL — ABNORMAL HIGH (ref 70–99)
Potassium: 3.8 mmol/L (ref 3.5–5.1)
Sodium: 140 mmol/L (ref 135–145)
Total Bilirubin: 1 mg/dL (ref 0.3–1.2)
Total Protein: 7.5 g/dL (ref 6.5–8.1)

## 2018-07-29 LAB — CBC WITH DIFFERENTIAL/PLATELET
Abs Immature Granulocytes: 0.11 10*3/uL — ABNORMAL HIGH (ref 0.00–0.07)
Basophils Absolute: 0.1 10*3/uL (ref 0.0–0.1)
Basophils Relative: 0 %
Eosinophils Absolute: 0.1 10*3/uL (ref 0.0–0.5)
Eosinophils Relative: 1 %
HCT: 37.1 % (ref 36.0–46.0)
Hemoglobin: 11.2 g/dL — ABNORMAL LOW (ref 12.0–15.0)
Immature Granulocytes: 1 %
Lymphocytes Relative: 13 %
Lymphs Abs: 1.9 10*3/uL (ref 0.7–4.0)
MCH: 26.7 pg (ref 26.0–34.0)
MCHC: 30.2 g/dL (ref 30.0–36.0)
MCV: 88.5 fL (ref 80.0–100.0)
Monocytes Absolute: 1.5 10*3/uL — ABNORMAL HIGH (ref 0.1–1.0)
Monocytes Relative: 10 %
Neutro Abs: 10.8 10*3/uL — ABNORMAL HIGH (ref 1.7–7.7)
Neutrophils Relative %: 75 %
Platelets: 486 10*3/uL — ABNORMAL HIGH (ref 150–400)
RBC: 4.19 MIL/uL (ref 3.87–5.11)
RDW: 17.1 % — ABNORMAL HIGH (ref 11.5–15.5)
WBC: 14.5 10*3/uL — ABNORMAL HIGH (ref 4.0–10.5)
nRBC: 0 % (ref 0.0–0.2)

## 2018-07-29 NOTE — Assessment & Plan Note (Addendum)
#  Multiple myeloma -IgA kappa; Stage II-currently on Revlimid/dexamethasone.  # May 2020 M protein- 1.1gm/dl.k/L=15;  Partial response;improving;Continue Revlimid 5 mg 2w/1w; with dex 4 mg three times/week.   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  # Hypercalcemia/MM-stable.  Continue Zometa '3mg'$  q 4-8 weeks.  #Spoke to patient's daughter [name] regarding patient's progress.  # DISPOSITION:  # follow up 4 weeks MD clinic-labs- cbc/Cmp/MM-k-l light chains; zometa- Dr.B

## 2018-07-29 NOTE — Progress Notes (Signed)
[place] OFFICE PROGRESS NOTE  Patient Care Team: [name], MD as PCP - General (Family Medicine)  Cancer Staging No matching staging information was found for the patient.   Oncology History Overview Note  # MAY 2019- MULTIPLE MYELOMA [No BMBx]; IgA-K [baseline- 3.4 gm/dl; ]  # Hypercalcemia- s/p Zometa 12m IVP [May 2019]  # Mod dementia; s/p Left hip Fx [may 2019]  ----------------------------------------------------------------------  DIAGNOSIS: [MAY 2019]- ACTIVE MULTIPLE MYELOMA  STAGE:  II  ; GOALS: PALLIATIVE  CURRENT/MOST RECENT THERAPY: Dex 447m3/w; Rev 69m669mw-On/1w-OFF    [place] [place]    INTERVAL HISTORY:  Theresa Lynch 70o.  female pleasant patient above history of dementia; also history of multiple myeloma most recently on Revlimid dexamethasone is here for follow-up.  Patient denies any nausea vomiting or diarrhea.  No fevers or chills.   Review of Systems  Constitutional: Positive for malaise/fatigue. Negative for chills, diaphoresis, fever and weight loss.  HENT: Negative for nosebleeds and sore throat.   Eyes: Negative for double vision.  Respiratory: Negative for cough, hemoptysis, sputum production, shortness of breath and wheezing.   Cardiovascular: Negative for chest pain, palpitations and orthopnea.  Gastrointestinal: Negative for abdominal pain, blood in stool, constipation, diarrhea, heartburn, melena, nausea and vomiting.  Genitourinary: Negative for dysuria, frequency and urgency.  Musculoskeletal: Negative for back pain and joint pain.  Neurological: Negative for dizziness, tingling, focal weakness, weakness and headaches.  Endo/Heme/Allergies: Does not bruise/bleed easily.  Psychiatric/Behavioral: Positive for memory loss. Negative for depression. The patient is not nervous/anxious and does not have insomnia.       PAST MEDICAL HISTORY :  Past Medical History:  Diagnosis Date   . Arthritis    hands  . Cataract   . Claustrophobia   . Depression    with anxiety/h/o agoraphobia  . Glaucoma   . [place] ([place]  left hip  . HOH (hard of hearing)   . Hyperlipidemia   . Hypertension   . Multiple myeloma ([place] . PONV (postoperative nausea and vomiting)     PAST SURGICAL HISTORY :   Past Surgical History:  Procedure Laterality Date  . CATARACT EXTRACTION W/PHACO Right 03/15/2015   Procedure: CATARACT EXTRACTION PHACO AND INTRAOCULAR LENS PLACEMENT (IOC);  Surgeon: AniRonnell FreshwaterD;  [facility];  Laterality: Right;  . HIP SURGERY    . INTRAMEDULLARY (IM) NAIL INTERTROCHANTERIC Left 05/25/2017   Procedure: INTRAMEDULLARY (IM) NAIL INTERTROCHANTRIC;  Surgeon: KraThornton ParkD;  Location: [facility] ORS;  Service: Orthopedics;  Laterality: Left;  . ROTATOR CUFF REPAIR  1999   bilateral    FAMILY HISTORY :   Family History  Problem Relation Age of Onset  . Cancer - Other Son        appendix cancer  . Prostate cancer Brother   . CAD Brother   . Kidney cancer Father   . Breast cancer Sister   . Stroke Sister     SOCIAL HISTORY:   Social History   Tobacco Use  . Smoking status: Never Smoker  . Smokeless tobacco: Never Used  Substance Use Topics  . Alcohol use: Yes    Alcohol/week: 7.0 standard drinks    Types: 7 Glasses of wine per week  . Drug use: No    ALLERGIES:  is allergic to codeine and penicillins.  MEDICATIONS:  Current Outpatient Medications  Medication Sig Dispense Refill  . amLODipine (  NORVASC) 5 MG tablet Take 1 tablet (5 mg total) by mouth daily. 30 tablet 0  . aspirin EC 81 MG tablet Take 1 tablet (81 mg total) by mouth daily. 30 tablet 1  . atorvastatin (LIPITOR) 10 MG tablet Take 5 mg by mouth daily.     . calcium gluconate 500 MG tablet Take 1 tablet by mouth 2 (two) times daily.    . cholecalciferol (VITAMIN D3) 25 MCG (1000 UT) tablet Take 2,000 Units by mouth 2  (two) times daily.     Marland Kitchen dexamethasone (DECADRON) 4 MG tablet TAKE ONE TABLET THREE TIMES A WEEK 12 tablet 4  . lenalidomide (REVLIMID) 5 MG capsule Take 1 capsule (5 mg total) by mouth daily. Take for 14 days on, then 7 days off. 14 capsule 0  . levothyroxine (SYNTHROID) 50 MCG tablet Take 1 tablet by mouth daily.    Marland Kitchen PARoxetine (PAXIL) 40 MG tablet Take 40 mg by mouth at bedtime.    . polyethylene glycol (MIRALAX / GLYCOLAX) packet Take 17 g by mouth daily. 14 each 0  . traMADol (ULTRAM) 50 MG tablet Take 1 tablet (50 mg total) by mouth every 6 (six) hours as needed for moderate pain or severe pain. 30 tablet 0  . traZODone (DESYREL) 50 MG tablet Take 25 mg by mouth at bedtime.     . vitamin B-12 (CYANOCOBALAMIN) 1000 MCG tablet Take 1,000 mcg by mouth daily.     No current facility-administered medications for this visit.     PHYSICAL EXAMINATION: ECOG PERFORMANCE STATUS: 1 - Symptomatic but completely ambulatory  BP 127/76 (BP Location: Left Arm, Patient Position: Sitting, Cuff Size: Normal)   Pulse 83   Temp 97.8 F (36.6 C) (Tympanic)   Resp 20   Ht '5\' 6"'$  (1.676 m)   Wt 143 lb 3.2 oz (65 kg)   BMI 23.11 kg/m   Filed Weights   07/29/18 1412  Weight: 143 lb 3.2 oz (65 kg)    Physical Exam  Constitutional:  [place] Caucasian female patient.  Resting in a wheelchair.  She is alone.   HENT:  Head: Normocephalic and atraumatic.  Mouth/Throat: Oropharynx is clear and moist. No oropharyngeal exudate.  Eyes: Pupils are equal, round, and reactive to light.  Neck: Normal range of motion. Neck supple.  Cardiovascular: Normal rate and regular rhythm.  Pulmonary/Chest: No respiratory distress. She has no wheezes.  Abdominal: Soft. Bowel sounds are normal. She exhibits no distension and no mass. There is no abdominal tenderness. There is no rebound and no guarding.  Musculoskeletal: Normal range of motion.        General: No tenderness or edema.     Comments: 3 to 4 cm  soft tissue mass noted above the elbow/right arm.  Nontender.  Soft.  Not mobile.  Neurological: She is alert.  Oriented 2-3.  Skin: Skin is warm.  Psychiatric: Affect normal.       LABORATORY DATA:  I have reviewed the data as listed    Component Value Date/Time   NA 140 07/29/2018 1352   K 3.8 07/29/2018 1352   CL 105 07/29/2018 1352   CO2 23 07/29/2018 1352   GLUCOSE 109 (H) 07/29/2018 1352   BUN 25 (H) 07/29/2018 1352   CREATININE 1.28 (H) 07/29/2018 1352   CALCIUM 8.8 (L) 07/29/2018 1352   CALCIUM 10.1 05/26/2017 0419   PROT 7.5 07/29/2018 1352   ALBUMIN 3.5 07/29/2018 1352   AST 16 07/29/2018 1352   ALT 16 07/29/2018  1352   ALKPHOS 51 07/29/2018 1352   BILITOT 1.0 07/29/2018 1352   GFRNONAA 38 (L) 07/29/2018 1352   GFRAA 44 (L) 07/29/2018 1352    No results found for: SPEP, UPEP  Lab Results  Component Value Date   WBC 14.5 (H) 07/29/2018   NEUTROABS 10.8 (H) 07/29/2018   HGB 11.2 (L) 07/29/2018   HCT 37.1 07/29/2018   MCV 88.5 07/29/2018   PLT 486 (H) 07/29/2018      Chemistry      Component Value Date/Time   NA 140 07/29/2018 1352   K 3.8 07/29/2018 1352   CL 105 07/29/2018 1352   CO2 23 07/29/2018 1352   BUN 25 (H) 07/29/2018 1352   CREATININE 1.28 (H) 07/29/2018 1352      Component Value Date/Time   CALCIUM 8.8 (L) 07/29/2018 1352   CALCIUM 10.1 05/26/2017 0419   ALKPHOS 51 07/29/2018 1352   AST 16 07/29/2018 1352   ALT 16 07/29/2018 1352   BILITOT 1.0 07/29/2018 1352       RADIOGRAPHIC STUDIES: I have personally reviewed the radiological images as listed and agreed with the findings in the report. No results found.   ASSESSMENT & PLAN:  Multiple myeloma in relapse ([place]) # Multiple myeloma -IgA kappa; Stage II-currently on Revlimid/dexamethasone.  # May 2020 M protein- 1.1gm/dl.k/L=15;  Partial response;improving;Continue Revlimid 5 mg 2w/1w; with dex 4 mg three times/week.   # CKD- stage III-1.2; continue hydration  # Anemia  secondary to multiple myeloma-stable.  # Hypercalcemia/MM-stable.  Continue Zometa 22m q 4-8 weeks.  #Spoke to patient's daughter SManuela Schwartzregarding patient's progress.  # DISPOSITION:  # follow up 4 weeks MD clinic-labs- cbc/Cmp/MM-k-l light chains; zometa- Dr.B   Orders Placed This Encounter  Procedures  . CBC with Differential    Standing Status:   Standing    Number of Occurrences:   20    Standing Expiration Date:   07/29/2019  . Comprehensive metabolic panel    Standing Status:   Standing    Number of Occurrences:   20    Standing Expiration Date:   07/29/2019  . Multiple Myeloma Panel (SPEP&IFE w/QIG)    Standing Status:   Standing    Number of Occurrences:   20    Standing Expiration Date:   07/29/2019  . Kappa/lambda light chains    Standing Status:   Standing    Number of Occurrences:   20    Standing Expiration Date:   07/29/2019   All questions were answered. The patient knows to call the clinic with any problems, questions or concerns.      GCammie Sickle MD 07/29/2018 8:09 PM

## 2018-07-30 LAB — MULTIPLE MYELOMA PANEL, SERUM
Albumin SerPl Elph-Mcnc: 3.5 g/dL (ref 2.9–4.4)
Albumin/Glob SerPl: 1 (ref 0.7–1.7)
Alpha 1: 0.2 g/dL (ref 0.0–0.4)
Alpha2 Glob SerPl Elph-Mcnc: 0.9 g/dL (ref 0.4–1.0)
B-Globulin SerPl Elph-Mcnc: 2.2 g/dL — ABNORMAL HIGH (ref 0.7–1.3)
Gamma Glob SerPl Elph-Mcnc: 0.3 g/dL — ABNORMAL LOW (ref 0.4–1.8)
Globulin, Total: 3.6 g/dL (ref 2.2–3.9)
IgA: 1889 mg/dL — ABNORMAL HIGH (ref 64–422)
IgG (Immunoglobin G), Serum: 275 mg/dL — ABNORMAL LOW (ref 586–1602)
IgM (Immunoglobulin M), Srm: <5 mg/dL — ABNORMAL LOW (ref 26–217)
M Protein SerPl Elph-Mcnc: 1.2 g/dL — ABNORMAL HIGH
Total Protein ELP: 7.1 g/dL (ref 6.0–8.5)

## 2018-07-30 LAB — KAPPA/LAMBDA LIGHT CHAINS
Kappa free light chain: 234.5 mg/L — ABNORMAL HIGH (ref 3.3–19.4)
Kappa, lambda light chain ratio: 41.14 — ABNORMAL HIGH (ref 0.26–1.65)
Lambda free light chains: 5.7 mg/L (ref 5.7–26.3)

## 2018-08-01 ENCOUNTER — Other Ambulatory Visit: Payer: Self-pay | Admitting: *Deleted

## 2018-08-01 DIAGNOSIS — C9002 Multiple myeloma in relapse: Secondary | ICD-10-CM

## 2018-08-01 MED ORDER — LENALIDOMIDE 5 MG PO CAPS: 5.0000 mg | ORAL_CAPSULE | Freq: Every day | ORAL | 0 refills | Status: AC

## 2018-08-23 ENCOUNTER — Telehealth: Payer: Self-pay | Admitting: *Deleted

## 2018-08-23 ENCOUNTER — Other Ambulatory Visit: Payer: Self-pay

## 2018-08-23 ENCOUNTER — Inpatient Hospital Stay (HOSPITAL_BASED_OUTPATIENT_CLINIC_OR_DEPARTMENT_OTHER): Payer: PPO | Admitting: Oncology

## 2018-08-23 DIAGNOSIS — R059 Cough, unspecified: Secondary | ICD-10-CM

## 2018-08-23 DIAGNOSIS — C9000 Multiple myeloma not having achieved remission: Secondary | ICD-10-CM | POA: Diagnosis not present

## 2018-08-23 DIAGNOSIS — R05 Cough: Secondary | ICD-10-CM | POA: Diagnosis not present

## 2018-08-23 DIAGNOSIS — E860 Dehydration: Secondary | ICD-10-CM | POA: Diagnosis not present

## 2018-08-23 NOTE — Telephone Encounter (Signed)
Spoke to patient and daughter.  She will have chest x-ray before her appointment with Dr. Rogue Bussing on Monday.  She does have increased shortness of breath and a cough.  She denies fevers.  She does not have any GI symptoms.  I do not believe she needs COVID-19 testing at this time.

## 2018-08-23 NOTE — Progress Notes (Signed)
Virtual Visit via Video Note  I connected with Theresa Lynch on 08/23/18 at  1:15 PM EDT by a video enabled telemedicine application and verified that I am speaking with the correct person using two identifiers.  Location: Patient: Home Provider: Office   I discussed the limitations of evaluation and management by telemedicine and the availability of in person appointments. The patient expressed understanding and agreed to proceed.  History of Present Illness: Patient presents to [facility] via video visit for complaints of dizziness, shortness of breath with exertion, decreased appetite, low-grade temperature (T-max 99.7), cough with yellow sputum production and tachycardia.  Patient states symptoms began approximately 3 weeks ago.  Her appetite is poor and she drinks very little fluids throughout the day.  She denies any constipation or diarrhea.  She denies any chest pain or palpitations.  She denies shortness of breath unless she is walking.  She spends most of her time on the couch.  Family is concerned she may be developing pneumonia again or have symptoms of COVID-19.  They wanted to be sure prior to her appointment on Monday that she was able to be seen on Monday in clinic.  Vital signs during our visit today were 112/73 with a heart rate of 116.   She has history of multiple myeloma; currently on Revlimid 5 mg 2 weeks on 1 week off with a Dex 4 mg 3 times per week. Has tolerated well to date.  She was hospitalized in December 2019 for right lower lobe pneumonia.    Observations/Objective: Review of Systems  Constitutional: Positive for fever and malaise/fatigue. Negative for chills and weight loss.  HENT: Positive for congestion. Negative for ear pain and tinnitus.   Eyes: Negative.  Negative for blurred vision and double vision.  Respiratory: Positive for cough, sputum production and shortness of breath.   Cardiovascular: Negative.  Negative for chest pain, palpitations and leg  swelling.  Gastrointestinal: Negative.  Negative for abdominal pain, constipation, diarrhea, nausea and vomiting.  Genitourinary: Negative for dysuria, frequency and urgency.  Musculoskeletal: Negative for back pain and falls.  Skin: Negative.  Negative for rash.  Neurological: Positive for weakness and headaches.  Endo/Heme/Allergies: Negative.  Does not bruise/bleed easily.  Psychiatric/Behavioral: Negative.  Negative for depression. The patient is not nervous/anxious and does not have insomnia.    Physical Exam Nursing note reviewed.  Constitutional:      Appearance: Normal appearance.  Cardiovascular:     Rate and Rhythm: Tachycardia present.  Pulmonary:     Effort: Pulmonary effort is normal.  Neurological:     Mental Status: She is alert and oriented to person, place, and time.    Assessment and Plan:  Multiple myeloma: Diagnosis in May 2019.  Currently on Revlimid 2 weeks on 1 week off.  Appears to be tolerating well.  Plan is to continue current regimen.  She is scheduled to return to clinic on 08/26/2018 for follow-up with Dr. Rogue Bussing, labs and Zometa for bony lesions.  Cough with yellow sputum production/shortness of breath: Unclear etiology.  Previous history of pneumonia.  Will get chest x-ray to rule out developing pneumonia.  Unlikely COVID-19. No fever.  Patient lives alone and has been self isolated since the beginning of the pandemic.  She only sees her daughters who are very careful/mindful.  Dizziness/poor appetite/tachycardia: Likely related to dehydration.  I have asked she try and consume plenty of fluids this weekend.  We will add on 1 L NaCl on Monday along with her  Zometa.   Tachycardia: She denies chest pain or palpitations.  Likely related to dehydration.  Is currently on Synthroid for hypothyroidism.  No history of arrhythmia.   Plan: Stop Revlimid as of today. Push Fluids. Stay hydrated. Stat chest x-ray.  Orders placed.  Plan is to get chest x-ray on  Monday prior to her appointment per her daughter. VSS.  Blood pressure during her visit was 112/73 with a heart rate of 116.  Not visibly short of breath.  Follow Up Instructions: RTC on Monday for chest x-ray, MD assessment with labs and Zometa. She was instructed to report to the emergency room for fever or worsening of symptoms. We will add on 1 L NaCl to her Zometa on Monday.  I discussed the assessment and treatment plan with the patient. The patient was provided an opportunity to ask questions and all were answered. The patient agreed with the plan and demonstrated an understanding of the instructions.   The patient was advised to call back or seek an in-person evaluation if the symptoms worsen or if the condition fails to improve as anticipated.  I provided 15 minutes of face-to-face time during this encounter.  Jacquelin Hawking, NP

## 2018-08-23 NOTE — Telephone Encounter (Signed)
Contacted Theresa Lynch's daughter-Theresa Lynch, for Theresa Lynch's pre- visit covid screening questions. Theresa Lynch stated Theresa Lynch is becoming persistently short of breath with minimal exertion. Denies any signs of fevers, cough, or GI distress. Daughter is requesting a virtual visit this afternoon for patient with Theresa Lynch. May consider post poning Theresa Lynch's upcoming apt on Monday. Daughter concerned about covid risk factors; although Theresa Lynch has remained self-isolated at home.  Theresa Lynch, I will add this patient for virtual visit to your sch. This afternoon. Please do a dox visit via Daughter- Theresa's cell phone at [phone].

## 2018-08-26 ENCOUNTER — Inpatient Hospital Stay: Payer: PPO | Admitting: Internal Medicine

## 2018-08-26 ENCOUNTER — Inpatient Hospital Stay: Payer: PPO

## 2018-08-26 ENCOUNTER — Telehealth: Payer: Self-pay | Admitting: *Deleted

## 2018-08-26 NOTE — Progress Notes (Deleted)
[place] OFFICE PROGRESS NOTE  Patient Care Team: [name], MD as PCP - General (Family Medicine)  Cancer Staging No matching staging information was found for the patient.   Oncology History Overview Note  # MAY 2019- MULTIPLE MYELOMA [No BMBx]; IgA-K [baseline- 3.4 gm/dl; ]  # Hypercalcemia- s/p Zometa 12m IVP [May 2019]  # Mod dementia; s/p Left hip Fx [may 2019]  ----------------------------------------------------------------------  DIAGNOSIS: [MAY 2019]- ACTIVE MULTIPLE MYELOMA  STAGE:  II  ; GOALS: PALLIATIVE  CURRENT/MOST RECENT THERAPY: Dex 447m3/w; Rev 69m669mw-On/1w-OFF    [facility] [place]    INTERVAL HISTORY:  Theresa Lynch 70o.  female pleasant patient above history of dementia; also history of multiple myeloma most recently on Revlimid dexamethasone is here for follow-up.  Patient denies any nausea vomiting or diarrhea.  No fevers or chills.   Review of Systems  Constitutional: Positive for malaise/fatigue. Negative for chills, diaphoresis, fever and weight loss.  HENT: Negative for nosebleeds and sore throat.   Eyes: Negative for double vision.  Respiratory: Negative for cough, hemoptysis, sputum production, shortness of breath and wheezing.   Cardiovascular: Negative for chest pain, palpitations and orthopnea.  Gastrointestinal: Negative for abdominal pain, blood in stool, constipation, diarrhea, heartburn, melena, nausea and vomiting.  Genitourinary: Negative for dysuria, frequency and urgency.  Musculoskeletal: Negative for back pain and joint pain.  Neurological: Negative for dizziness, tingling, focal weakness, weakness and headaches.  Endo/Heme/Allergies: Does not bruise/bleed easily.  Psychiatric/Behavioral: Positive for memory loss. Negative for depression. The patient is not nervous/anxious and does not have insomnia.       PAST MEDICAL HISTORY :  Past Medical History:  Diagnosis Date   . Arthritis    hands  . Cataract   . Claustrophobia   . Depression    with anxiety/h/o agoraphobia  . Glaucoma   . [place] fracture ([facility]  left hip  . HOH (hard of hearing)   . Hyperlipidemia   . Hypertension   . Multiple myeloma ([facility] [facility] PONV (postoperative nausea and vomiting)     PAST SURGICAL HISTORY :   Past Surgical History:  Procedure Laterality Date  . CATARACT EXTRACTION W/PHACO Right 03/15/2015   Procedure: CATARACT EXTRACTION PHACO AND INTRAOCULAR LENS PLACEMENT (IOC);  Surgeon: AniRonnell FreshwaterD;  [facility];  Laterality: Right;  . HIP SURGERY    . INTRAMEDULLARY (IM) NAIL INTERTROCHANTERIC Left 05/25/2017   Procedure: INTRAMEDULLARY (IM) NAIL INTERTROCHANTRIC;  Surgeon: KraThornton ParkD;  Location: [facility] ORS;  Service: Orthopedics;  Laterality: Left;  . ROTATOR CUFF REPAIR  1999   bilateral    FAMILY HISTORY :   Family History  Problem Relation Age of Onset  . Cancer - Other Son        appendix cancer  . Prostate cancer Brother   . CAD Brother   . Kidney cancer Father   . Breast cancer Sister   . Stroke Sister     SOCIAL HISTORY:   Social History   Tobacco Use  . Smoking status: Never Smoker  . Smokeless tobacco: Never Used  Substance Use Topics  . Alcohol use: Yes    Alcohol/week: 7.0 standard drinks    Types: 7 Glasses of wine per week  . Drug use: No    ALLERGIES:  is allergic to codeine and penicillins.  MEDICATIONS:  Current Outpatient Medications  Medication Sig Dispense Refill  . amLODipine (  NORVASC) 5 MG tablet Take 1 tablet (5 mg total) by mouth daily. 30 tablet 0  . aspirin EC 81 MG tablet Take 1 tablet (81 mg total) by mouth daily. 30 tablet 1  . atorvastatin (LIPITOR) 10 MG tablet Take 5 mg by mouth daily.     . calcium gluconate 500 MG tablet Take 1 tablet by mouth 2 (two) times daily.    . cholecalciferol (VITAMIN D3) 25 MCG (1000 UT) tablet Take 2,000 Units by mouth 2  (two) times daily.     Marland Kitchen dexamethasone (DECADRON) 4 MG tablet TAKE ONE TABLET THREE TIMES A WEEK 12 tablet 4  . lenalidomide (REVLIMID) 5 MG capsule Take 1 capsule (5 mg total) by mouth daily. Take for 14 days on, then 7 days off. 14 capsule 0  . levothyroxine (SYNTHROID) 50 MCG tablet Take 1 tablet by mouth daily.    Marland Kitchen PARoxetine (PAXIL) 40 MG tablet Take 40 mg by mouth at bedtime.    . polyethylene glycol (MIRALAX / GLYCOLAX) packet Take 17 g by mouth daily. 14 each 0  . traMADol (ULTRAM) 50 MG tablet Take 1 tablet (50 mg total) by mouth every 6 (six) hours as needed for moderate pain or severe pain. 30 tablet 0  . traZODone (DESYREL) 50 MG tablet Take 25 mg by mouth at bedtime.     . vitamin B-12 (CYANOCOBALAMIN) 1000 MCG tablet Take 1,000 mcg by mouth daily.     No current facility-administered medications for this visit.     PHYSICAL EXAMINATION: ECOG PERFORMANCE STATUS: 1 - Symptomatic but completely ambulatory  There were no vitals taken for this visit.  There were no vitals filed for this visit.  Physical Exam  Constitutional:  Theresa Lynch Caucasian female patient.  Resting in a wheelchair.  She is alone.   HENT:  Head: Normocephalic and atraumatic.  Mouth/Throat: Oropharynx is clear and moist. No oropharyngeal exudate.  Eyes: Pupils are equal, round, and reactive to light.  Neck: Normal range of motion. Neck supple.  Cardiovascular: Normal rate and regular rhythm.  Pulmonary/Chest: No respiratory distress. She has no wheezes.  Abdominal: Soft. Bowel sounds are normal. She exhibits no distension and no mass. There is no abdominal tenderness. There is no rebound and no guarding.  Musculoskeletal: Normal range of motion.        General: No tenderness or edema.     Comments: 3 to 4 cm soft tissue mass noted above the elbow/right arm.  Nontender.  Soft.  Not mobile.  Neurological: She is alert.  Oriented 2-3.  Skin: Skin is warm.  Psychiatric: Affect normal.        LABORATORY DATA:  I have reviewed the data as listed    Component Value Date/Time   NA 140 07/29/2018 1352   K 3.8 07/29/2018 1352   CL 105 07/29/2018 1352   CO2 23 07/29/2018 1352   GLUCOSE 109 (H) 07/29/2018 1352   BUN 25 (H) 07/29/2018 1352   CREATININE 1.28 (H) 07/29/2018 1352   CALCIUM 8.8 (L) 07/29/2018 1352   CALCIUM 10.1 05/26/2017 0419   PROT 7.5 07/29/2018 1352   ALBUMIN 3.5 07/29/2018 1352   AST 16 07/29/2018 1352   ALT 16 07/29/2018 1352   ALKPHOS 51 07/29/2018 1352   BILITOT 1.0 07/29/2018 1352   GFRNONAA 38 (L) 07/29/2018 1352   GFRAA 44 (L) 07/29/2018 1352    No results found for: SPEP, UPEP  Lab Results  Component Value Date   WBC 14.5 (H) 07/29/2018  NEUTROABS 10.8 (H) 07/29/2018   HGB 11.2 (L) 07/29/2018   HCT 37.1 07/29/2018   MCV 88.5 07/29/2018   PLT 486 (H) 07/29/2018      Chemistry      Component Value Date/Time   NA 140 07/29/2018 1352   K 3.8 07/29/2018 1352   CL 105 07/29/2018 1352   CO2 23 07/29/2018 1352   BUN 25 (H) 07/29/2018 1352   CREATININE 1.28 (H) 07/29/2018 1352      Component Value Date/Time   CALCIUM 8.8 (L) 07/29/2018 1352   CALCIUM 10.1 05/26/2017 0419   ALKPHOS 51 07/29/2018 1352   AST 16 07/29/2018 1352   ALT 16 07/29/2018 1352   BILITOT 1.0 07/29/2018 1352       RADIOGRAPHIC STUDIES: I have personally reviewed the radiological images as listed and agreed with the findings in the report. No results found.   ASSESSMENT & PLAN:  No problem-specific Assessment & Plan notes found for this encounter.   No orders of the defined types were placed in this encounter.  All questions were answered. The patient knows to call the clinic with any problems, questions or concerns.      Cammie Sickle, MD 08/26/2018 1:00 PM

## 2018-08-26 NOTE — Assessment & Plan Note (Deleted)
#  Multiple myeloma -IgA kappa; Stage II-currently on Revlimid/dexamethasone.  # May 2020 M protein- 1.1gm/dl.k/L=15;  Partial response;improving;Continue Revlimid 5 mg 2w/1w; with dex 4 mg three times/week.   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  # Hypercalcemia/MM-stable.  Continue Zometa '3mg'$  q 4-8 weeks.  #Spoke to patient's daughter [name] regarding patient's progress.  # DISPOSITION:  # follow up 4 weeks MD clinic-labs- cbc/Cmp/MM-k-l light chains; zometa- Dr.B

## 2018-08-26 NOTE — Telephone Encounter (Signed)
Patient no showed for today's lab/md/zometa visit today. Colette, contacted daughter to call our office back re: no show visit today.

## 2018-08-27 ENCOUNTER — Telehealth: Payer: Self-pay | Admitting: Internal Medicine

## 2018-08-27 NOTE — Telephone Encounter (Signed)
Left msg for susan- pt's daughter to contact the cancer center to r/s her mother's apts and also to f/u on pt's smc call from last week.

## 2018-08-27 NOTE — Telephone Encounter (Signed)
Thank you Nira Conn for the detailed note.  I will try to reach out to Susan/patient's daughter later in the day today.

## 2018-08-27 NOTE — Telephone Encounter (Signed)
Long discussion with the patient's daughter Theresa Lynch regarding her mom's declining clinical condition.  Patient not interested in seeking medical care at this time.  Family understands-wanted to keep her comfortable.  Interested in palliative care evaluation-in the next couple of days.  Please make a palliative care.

## 2018-08-27 NOTE — Telephone Encounter (Addendum)
Per daughter, Theresa Lynch, pt has "refused to come into the clinic until she feels better." patient is not doing well and just laying in the bed. Patient continues to be short of breath with exertion and c/o intermittent diarrhea. Family voices that they believe that the patient is "giving up." "my brother and I have tried to explain to her the consequences of mom's choices of not keeping this appointment. I don't know how hard to push my mom to convince her to come to the appointment. I feel like my mom is trying to hide her shortness of breath. She just wants to lay there and not wanting to get out of bed."   Patient also refusing imodium for the revlimid induced diarrhea per daughter. Patient currently on week off for revlimid. Daughter will cont. To offer the imodium for patient's diarrhea. Daughter does not want to make an apt at this time out of respect of her mother's decision.   Daughter is interested in palliative care, but she is uncertain whether the patient is agreeable to palliative care or ready to receive it. Daughter would like to have a referral to home palliative care if possible, but she would like to talk to her mom before this referral is initiated.  Daughter states she believes her mom is struggling with depression. Daughter is concerned about pt having pneumonia given patient's shortness of breath. If she sits up, pt gets short of breath. Pt has not been exposed to anyone with covid and has not been out of her home.  Patient never obtained a cxr. Per daughter-pt does not have any fevers that she is aware of at this time, but she will have the sister in law recheck the temp. Last recorded temp a few days ago was 99.7.   Daughter/family understands to contact our office back if patient's symptoms worsen. Theresa Lynch will contact our office back to initiate palliative care consult after further discussion with patient/pt's son.

## 2018-08-28 NOTE — Telephone Encounter (Signed)
Home Palliative care referral faxed to [facility]

## 2018-08-30 ENCOUNTER — Telehealth: Payer: Self-pay | Admitting: Student

## 2018-08-30 ENCOUNTER — Telehealth: Payer: PPO | Admitting: Physician Assistant

## 2018-08-30 DIAGNOSIS — R05 Cough: Secondary | ICD-10-CM

## 2018-08-30 DIAGNOSIS — R000 Tachycardia, unspecified: Secondary | ICD-10-CM

## 2018-08-30 DIAGNOSIS — R059 Cough, unspecified: Secondary | ICD-10-CM

## 2018-08-30 DIAGNOSIS — R609 Edema, unspecified: Secondary | ICD-10-CM

## 2018-08-30 NOTE — Progress Notes (Signed)
Based on what you have shared with me, you need to seek an evaluation for a severe illness that is causing your symptoms. These symptoms may be coronavirus or some other illness.  I highly recommend that you be seen and evaluated "face to face". Your increased heart rate, low oxygen saturation while walking, swelling in forearm and hand, and red tinged sputum in cough needed to be evaluated. You will likely benefit from additional testing/imaging at this time.Our Emergency Departments are best equipped to handle patients with severe symptoms.  You will be evaluated by the ER provider (or higher level of care provider) who will determine whether you need formal testing.   I recommend the following:  . If you are having a true medical emergency please call 911. . If you are considered high risk for Corona virus because of a known exposure, fever, shortness of breath and cough, OR if you have severe symptoms of any kind, seek medical care at an emergency room.   . [facility] Emergency Department [place], [place], [place] [address] [phone]  . [facility] Emergency Department [place], [place], [place] [address] [phone]  . [facility] Emergency Department [place], [place], [place] [address] [phone]  . [facility] Emergency Department [address] [place], [place], [place] [address] [phone]  . [facility] Emergency Department [place], [place], [place] [address] [phone]  NOTE: If you entered your credit card information for this eVisit, you will not be charged. You may see a "hold" on your card for the $35 but that hold will drop off and you will not have a charge processed.   Your e-visit answers were reviewed by a board certified advanced clinical practitioner to complete your personal care plan.  Thank you for using e-Visits.  I  have spent 5 minutes in review of e-visit questionnaire, review and updating patient chart, medical decision making and response to patient.    Tenna Delaine, PA-C

## 2018-08-30 NOTE — Telephone Encounter (Signed)
Spoke with daughter Max Sane regarding Palliative services and she had requested an In-person visit but after I asked her the questions for the [place] screening questions she answered yes that the patient is having shortness of breath; productive sputum; low grade fever and diarrhea, so I explained to her that we would not be able to do an in-person visit.  I have scheduled a Telehealth Palliative consult for 09/02/18 @ 11:30.

## 2018-08-31 ENCOUNTER — Emergency Department: Payer: PPO

## 2018-08-31 ENCOUNTER — Emergency Department
Admission: EM | Admit: 2018-08-31 | Discharge: 2018-09-01 | Disposition: A | Discharge status: Other Acute Inpatient Hospital | Payer: PPO | Attending: Emergency Medicine | Admitting: Emergency Medicine

## 2018-08-31 ENCOUNTER — Other Ambulatory Visit: Payer: Self-pay

## 2018-08-31 DIAGNOSIS — R531 Weakness: Secondary | ICD-10-CM | POA: Insufficient documentation

## 2018-08-31 DIAGNOSIS — Z7982 Long term (current) use of aspirin: Secondary | ICD-10-CM | POA: Insufficient documentation

## 2018-08-31 DIAGNOSIS — Z79899 Other long term (current) drug therapy: Secondary | ICD-10-CM | POA: Insufficient documentation

## 2018-08-31 DIAGNOSIS — R109 Unspecified abdominal pain: Secondary | ICD-10-CM | POA: Diagnosis not present

## 2018-08-31 DIAGNOSIS — E039 Hypothyroidism, unspecified: Secondary | ICD-10-CM | POA: Diagnosis not present

## 2018-08-31 DIAGNOSIS — K828 Other specified diseases of gallbladder: Secondary | ICD-10-CM | POA: Diagnosis not present

## 2018-08-31 DIAGNOSIS — I10 Essential (primary) hypertension: Secondary | ICD-10-CM | POA: Diagnosis not present

## 2018-08-31 DIAGNOSIS — R000 Tachycardia, unspecified: Secondary | ICD-10-CM | POA: Diagnosis not present

## 2018-08-31 DIAGNOSIS — K449 Diaphragmatic hernia without obstruction or gangrene: Secondary | ICD-10-CM | POA: Diagnosis not present

## 2018-08-31 DIAGNOSIS — R112 Nausea with vomiting, unspecified: Secondary | ICD-10-CM | POA: Diagnosis not present

## 2018-08-31 DIAGNOSIS — U071 COVID-19: Secondary | ICD-10-CM | POA: Insufficient documentation

## 2018-08-31 DIAGNOSIS — R197 Diarrhea, unspecified: Secondary | ICD-10-CM | POA: Diagnosis not present

## 2018-08-31 DIAGNOSIS — R1111 Vomiting without nausea: Secondary | ICD-10-CM | POA: Diagnosis not present

## 2018-08-31 DIAGNOSIS — J90 Pleural effusion, not elsewhere classified: Secondary | ICD-10-CM | POA: Diagnosis not present

## 2018-08-31 DIAGNOSIS — I129 Hypertensive chronic kidney disease with stage 1 through stage 4 chronic kidney disease, or unspecified chronic kidney disease: Secondary | ICD-10-CM | POA: Insufficient documentation

## 2018-08-31 DIAGNOSIS — K5730 Diverticulosis of large intestine without perforation or abscess without bleeding: Secondary | ICD-10-CM | POA: Diagnosis not present

## 2018-08-31 DIAGNOSIS — N183 Chronic kidney disease, stage 3 (moderate): Secondary | ICD-10-CM | POA: Diagnosis not present

## 2018-08-31 DIAGNOSIS — R05 Cough: Secondary | ICD-10-CM | POA: Diagnosis present

## 2018-08-31 DIAGNOSIS — E162 Hypoglycemia, unspecified: Secondary | ICD-10-CM | POA: Diagnosis not present

## 2018-08-31 DIAGNOSIS — R0902 Hypoxemia: Secondary | ICD-10-CM | POA: Diagnosis not present

## 2018-08-31 DIAGNOSIS — E161 Other hypoglycemia: Secondary | ICD-10-CM | POA: Diagnosis not present

## 2018-08-31 LAB — URINALYSIS, COMPLETE (UACMP) WITH MICROSCOPIC
Bilirubin Urine: NEGATIVE
Glucose, UA: NEGATIVE mg/dL
Ketones, ur: 5 mg/dL — AB
Nitrite: NEGATIVE
Protein, ur: NEGATIVE mg/dL
Specific Gravity, Urine: 1.005 (ref 1.005–1.030)
pH: 6 (ref 5.0–8.0)

## 2018-08-31 LAB — SARS CORONAVIRUS 2 BY RT PCR (HOSPITAL ORDER, PERFORMED IN ~~LOC~~ HOSPITAL LAB): SARS Coronavirus 2: POSITIVE — AB

## 2018-08-31 LAB — COMPREHENSIVE METABOLIC PANEL
ALT: 10 U/L (ref 0–44)
AST: 28 U/L (ref 15–41)
Albumin: 2.3 g/dL — ABNORMAL LOW (ref 3.5–5.0)
Alkaline Phosphatase: 46 U/L (ref 38–126)
Anion gap: 13 (ref 5–15)
BUN: 21 mg/dL (ref 8–23)
CO2: 18 mmol/L — ABNORMAL LOW (ref 22–32)
Calcium: 7.2 mg/dL — ABNORMAL LOW (ref 8.9–10.3)
Chloride: 102 mmol/L (ref 98–111)
Creatinine, Ser: 1.27 mg/dL — ABNORMAL HIGH (ref 0.44–1.00)
GFR calc Af Amer: 44 mL/min — ABNORMAL LOW (ref >60)
GFR calc non Af Amer: 38 mL/min — ABNORMAL LOW (ref >60)
Glucose, Bld: 110 mg/dL — ABNORMAL HIGH (ref 70–99)
Potassium: 2.8 mmol/L — ABNORMAL LOW (ref 3.5–5.1)
Sodium: 133 mmol/L — ABNORMAL LOW (ref 135–145)
Total Bilirubin: 0.8 mg/dL (ref 0.3–1.2)
Total Protein: 5.9 g/dL — ABNORMAL LOW (ref 6.5–8.1)

## 2018-08-31 LAB — CBC
HCT: 27.3 % — ABNORMAL LOW (ref 36.0–46.0)
Hemoglobin: 8.3 g/dL — ABNORMAL LOW (ref 12.0–15.0)
MCH: 25.1 pg — ABNORMAL LOW (ref 26.0–34.0)
MCHC: 30.4 g/dL (ref 30.0–36.0)
MCV: 82.5 fL (ref 80.0–100.0)
Platelets: 400 10*3/uL (ref 150–400)
RBC: 3.31 MIL/uL — ABNORMAL LOW (ref 3.87–5.11)
RDW: 17.9 % — ABNORMAL HIGH (ref 11.5–15.5)
WBC: 3.5 10*3/uL — ABNORMAL LOW (ref 4.0–10.5)
nRBC: 0 % (ref 0.0–0.2)

## 2018-08-31 LAB — PROTIME-INR
INR: 1.3 — ABNORMAL HIGH (ref 0.8–1.2)
Prothrombin Time: 15.8 seconds — ABNORMAL HIGH (ref 11.4–15.2)

## 2018-08-31 LAB — LACTIC ACID, PLASMA
Lactic Acid, Venous: 1.1 mmol/L (ref 0.5–1.9)
Lactic Acid, Venous: 1.4 mmol/L (ref 0.5–1.9)

## 2018-08-31 LAB — HEMOGLOBIN AND HEMATOCRIT, BLOOD
HCT: 27.3 % — ABNORMAL LOW (ref 36.0–46.0)
Hemoglobin: 8.4 g/dL — ABNORMAL LOW (ref 12.0–15.0)

## 2018-08-31 LAB — MAGNESIUM: Magnesium: 1.9 mg/dL (ref 1.7–2.4)

## 2018-08-31 LAB — TYPE AND SCREEN
ABO/RH(D): A POS
Antibody Screen: NEGATIVE

## 2018-08-31 LAB — APTT: aPTT: 34 seconds (ref 24–36)

## 2018-08-31 LAB — LIPASE, BLOOD: Lipase: 42 U/L (ref 11–51)

## 2018-08-31 MED ORDER — LOPERAMIDE HCL 2 MG PO CAPS
4.0000 mg | ORAL_CAPSULE | Freq: Once | ORAL | Status: AC
  Administered 2018-08-31: 4 mg via ORAL
  Filled 2018-08-31: qty 2

## 2018-08-31 MED ORDER — POTASSIUM CHLORIDE CRYS ER 20 MEQ PO TBCR
40.0000 meq | EXTENDED_RELEASE_TABLET | Freq: Two times a day (BID) | ORAL | Status: DC
  Administered 2018-09-01: 40 meq via ORAL
  Filled 2018-08-31: qty 2

## 2018-08-31 MED ORDER — POTASSIUM CHLORIDE 10 MEQ/100ML IV SOLN
10.0000 meq | INTRAVENOUS | Status: AC
  Administered 2018-08-31: 10 meq via INTRAVENOUS
  Filled 2018-08-31 (×2): qty 100

## 2018-08-31 MED ORDER — PAROXETINE HCL 20 MG PO TABS
40.0000 mg | ORAL_TABLET | Freq: Every day | ORAL | Status: DC
  Administered 2018-08-31: 40 mg via ORAL
  Filled 2018-08-31: qty 2

## 2018-08-31 MED ORDER — PAROXETINE HCL 20 MG PO TABS: 40.0000 mg | ORAL_TABLET | Freq: Every day | ORAL | Status: DC

## 2018-08-31 MED ORDER — PANTOPRAZOLE SODIUM 40 MG IV SOLR
40.0000 mg | Freq: Once | INTRAVENOUS | Status: AC
  Administered 2018-08-31: 40 mg via INTRAVENOUS
  Filled 2018-08-31: qty 40

## 2018-08-31 NOTE — ED Notes (Signed)
Daughter at bedside.

## 2018-08-31 NOTE — ED Provider Notes (Signed)
3:39 PM Assumed care for off going team.   Blood pressure 126/66, pulse (!) 106, temperature 98.2 F (36.8 C), temperature source Oral, resp. rate 16, height '5\' 6"'  (1.676 m), weight 64 kg, SpO2 97 %.  See their HPI for full report but in brief patient has multiple myeloma who presents with decreased appetite, nausea and vomiting and diarrhea.  Patient pending repeat CBC giving patient had some coffee-ground emesis, COVID swab and possible DC if tolerating p.o. versus admission   Creatinine is around patient's baseline.  Potassium is 2.8.   Hemoglobin was 11.73-monthago and tonight it has dropped down to 8.3.  With patient's history of coffee-ground emesis will warrant admission for trending hemoglobins.  Patient just needs her coronavirus testing to come back.  Will start repletion of her potassium in the process.  6:43 PM COVID +, discussed with patient's daughter about transfer given hypokalemia as well as trending hemoglobins.  They feel comfortable with this.  7:16 PM D/w Dr. [facility]cario Droughtwith [facility]  Given the concern for GI bleed [facility]  7:37 PM D[facility]  He noted that the BUN was normal and did not feel like this was a GI bleed.  He recommend that we repeat the hemoglobin at this time.  10:06 PM D/w Dr. GAlcario Droughtabout the patient.  Even though the hemoglobin is stable he still concerned and is going to talk [facility] [place] [place]  EKG is concerning for atrial fibrillation rate of 111.  I discussed with patient's daughter and this is new for patient.  [facility]  Discussed with Dr. GAlcario Droughtand he is aware that patient is a new atrial fibrillation.  No evidence of RVR at this time and patient is hemodynamically stable.  Will not start any blood thinners given the concern for drop in hemoglobin.  [facility]                    FVanessa Horine MD 08/31/18 [phone]

## 2018-08-31 NOTE — ED Notes (Signed)
Tammy @ [facility] called to repage md for update

## 2018-08-31 NOTE — ED Notes (Signed)
X-ray at bedside

## 2018-08-31 NOTE — ED Provider Notes (Signed)
[facility] Emergency Department Provider Note ____________________________________________   First MD Initiated Contact with Patient 08/31/18 1326     (approximate)  I have reviewed the triage vital signs and the nursing notes.   HISTORY  Chief Complaint Emesis    HPI Theresa Lynch is a 85 y.o. female with PMH as noted below including history of multiple myeloma who presents primarily with nausea and vomiting today which the daughter describes as dark in color with possible coffee-ground her digested blood.  The patient has also had nonbloody diarrhea for the last week.  She denies any abdominal pain.  She has had decreased appetite and increased generalized weakness.  The patient was also supposed to get a chest x-ray and a COVID swab as an outpatient due productive cough, but was too weak to be able to do this.   Past Medical History:  Diagnosis Date  . Arthritis    hands  . Cataract   . Claustrophobia   . Depression    with anxiety/h/o agoraphobia  . Glaucoma   . Hip fracture ([place])    left hip  . HOH (hard of hearing)   . Hyperlipidemia   . Hypertension   . Multiple myeloma ([place])   . PONV (postoperative nausea and vomiting)     Patient Active Problem List   Diagnosis Date Noted  . Pneumonia 01/13/2018  . Essential hypertension 12/07/2017  . Pure hypercholesterolemia 12/07/2017  . Acquired hypothyroidism 11/20/2017  . DNR (do not resuscitate) 10/15/2017  . Multiple myeloma in relapse ([place]) 06/15/2017  . Age-related osteoporosis with current pathological fracture with routine healing 06/01/2017  . Hip fracture ([place]) 05/25/2017  . Stage 3 chronic kidney disease ([place]) 08/21/2016  . Borderline diabetes mellitus 04/13/2015  . Macrocytic anemia 04/13/2015  . Vaccine counseling 04/12/2015  . Benign paroxysmal positional vertigo 09/27/2012  . Leucocytosis 08/23/2012  . TBI (traumatic brain injury) ([place]) 08/21/2012  . Hypokalemia  08/21/2012  . Hyponatremia 08/21/2012  . Syncope 08/21/2012  . Chest pain 08/21/2012  . Fall 08/18/2012  . Acute respiratory failure ([place]) 08/18/2012  . Acute blood loss anemia 08/18/2012  . Intracerebral hemorrhage ([place]) 08/15/2012  . Subarachnoid hemorrhage ([place]) 08/15/2012  . Closed basilar skull fracture with subarachnoid hemorrhage ([place]) 08/15/2012  . Closed fracture of facial bones ([place]) 08/15/2012  . History of intracranial hemorrhage 07/23/2012    Past Surgical History:  Procedure Laterality Date  . CATARACT EXTRACTION W/PHACO Right 03/15/2015   Procedure: CATARACT EXTRACTION PHACO AND INTRAOCULAR LENS PLACEMENT (IOC);  Surgeon: Ronnell Freshwater, MD;  Location: [facility];  Service: Ophthalmology;  Laterality: Right;  . HIP SURGERY    . INTRAMEDULLARY (IM) NAIL INTERTROCHANTERIC Left 05/25/2017   Procedure: INTRAMEDULLARY (IM) NAIL INTERTROCHANTRIC;  Surgeon: Thornton Park, MD;  Location: [facility] ORS;  Service: Orthopedics;  Laterality: Left;  . ROTATOR CUFF REPAIR  1999   bilateral    Prior to Admission medications   Medication Sig Start Date End Date Taking? Authorizing Provider  amLODipine (NORVASC) 5 MG tablet Take 1 tablet (5 mg total) by mouth daily. 06/01/17   Gladstone Lighter, MD  aspirin EC 81 MG tablet Take 1 tablet (81 mg total) by mouth daily. 07/02/17   Earlie Server, MD  atorvastatin (LIPITOR) 10 MG tablet Take 5 mg by mouth daily.     [provider]  calcium gluconate 500 MG tablet Take 1 tablet by mouth 2 (two) times daily.    [provider]  cholecalciferol (VITAMIN D3)  25 MCG (1000 UT) tablet Take 2,000 Units by mouth 2 (two) times daily.     [provider]  dexamethasone (DECADRON) 4 MG tablet TAKE ONE TABLET THREE TIMES A WEEK 06/19/18   Cammie Sickle, MD  lenalidomide (REVLIMID) 5 MG capsule Take 1 capsule (5 mg total) by mouth daily. Take for 14 days on, then 7 days off. 08/01/18   Cammie Sickle, MD   levothyroxine (SYNTHROID) 50 MCG tablet Take 1 tablet by mouth daily. 07/09/18   [provider]  PARoxetine (PAXIL) 40 MG tablet Take 40 mg by mouth at bedtime. 09/05/12   Love, Ivan Anchors, PA-C  polyethylene glycol (MIRALAX / GLYCOLAX) packet Take 17 g by mouth daily. 06/01/17   Gladstone Lighter, MD  traMADol (ULTRAM) 50 MG tablet Take 1 tablet (50 mg total) by mouth every 6 (six) hours as needed for moderate pain or severe pain. 05/31/17   Gladstone Lighter, MD  traZODone (DESYREL) 50 MG tablet Take 25 mg by mouth at bedtime.     [provider]  vitamin B-12 (CYANOCOBALAMIN) 1000 MCG tablet Take 1,000 mcg by mouth daily.    [provider]    Allergies Codeine and Penicillins  Family History  Problem Relation Age of Onset  . Cancer - Other Son        appendix cancer  . Prostate cancer Brother   . CAD Brother   . Kidney cancer Father   . Breast cancer Sister   . Stroke Sister     Social History Social History   Tobacco Use  . Smoking status: Never Smoker  . Smokeless tobacco: Never Used  Substance Use Topics  . Alcohol use: Yes    Alcohol/week: 7.0 standard drinks    Types: 7 Glasses of wine per week  . Drug use: No    Review of Systems  Constitutional: No fever.  Positive for weakness. Eyes: No redness. ENT: No sore throat. Cardiovascular: Denies chest pain. Respiratory: Denies acute shortness of breath. Gastrointestinal: Positive for nausea and vomiting Genitourinary: Negative for dysuria.  Musculoskeletal: Negative for back pain. Skin: Negative for rash. Neurological: Negative for headache.   ____________________________________________   PHYSICAL EXAM:  VITAL SIGNS: ED Triage Vitals  Enc Vitals Group     BP 08/31/18 1245 120/70     Pulse Rate 08/31/18 1245 (!) 112     Resp 08/31/18 1245 16     Temp 08/31/18 1245 98.2 F (36.8 C)     Temp Source 08/31/18 1245 Oral     SpO2 08/31/18 1245 95 %     Weight 08/31/18 1246 141 lb  (64 kg)     Height 08/31/18 1246 5' 6" (1.676 m)     Head Circumference --      Peak Flow --      Pain Score 08/31/18 1246 0     Pain Loc --      Pain Edu? --      Excl. in [place]? --     Constitutional: Alert and oriented.  Weak appearing but in no acute distress. Eyes: Conjunctivae are normal.  Head: Atraumatic. Nose: No congestion/rhinnorhea. Mouth/Throat: Mucous membranes are somewhat dry.   Neck: Normal range of motion.  Cardiovascular: Normal rate, regular rhythm. Good peripheral circulation. Respiratory: Normal respiratory effort.  No retractions.  Gastrointestinal: Soft with minimal diffuse discomfort to palpation but no focal tenderness or peritoneal signs.  No distention.  Genitourinary: No flank tenderness. Musculoskeletal: Extremities warm and well perfused.  Neurologic:  Normal speech and language. No gross focal neurologic deficits are appreciated.  Skin:  Skin is warm and dry. No rash noted. Psychiatric: Mood and affect are normal. Speech and behavior are normal.  ____________________________________________   LABS (all labs ordered are listed, but only abnormal results are displayed)  Labs Reviewed  COMPREHENSIVE METABOLIC PANEL - Abnormal; Notable for the following components:      Result Value   Sodium 133 (*)    Potassium 2.8 (*)    CO2 18 (*)    Glucose, Bld 110 (*)    Creatinine, Ser 1.27 (*)    Calcium 7.2 (*)    Total Protein 5.9 (*)    Albumin 2.3 (*)    GFR calc non Af Amer 38 (*)    GFR calc Af Amer 44 (*)    All other components within normal limits  CBC - Abnormal; Notable for the following components:   WBC 3.5 (*)    RBC 3.31 (*)    Hemoglobin 8.3 (*)    HCT 27.3 (*)    MCH 25.1 (*)    RDW 17.9 (*)    All other components within normal limits  SARS CORONAVIRUS 2 ([facility])  LIPASE, BLOOD  LACTIC ACID, PLASMA  URINALYSIS, COMPLETE (UACMP) WITH MICROSCOPIC  LACTIC ACID, PLASMA  PROTIME-INR   APTT  TYPE AND SCREEN   ____________________________________________  EKG  ED ECG REPORT I, Arta Silence, the attending physician, personally viewed and interpreted this ECG.  Date: 08/31/2018 EKG Time: Pending Rate:  Rhythm:  ST/T Wave abnormalities:  Narrative Interpretation:   ____________________________________________  RADIOLOGY  CXR: Multifocal airspace opacities CT abdomen: No acute intra-abdominal abnormality.  Gallbladder sludge with no evidence of acute cholecystitis.  ____________________________________________   PROCEDURES  Procedure(s) performed: No  Procedures  Critical Care performed: No ____________________________________________   INITIAL IMPRESSION / ASSESSMENT AND PLAN / ED COURSE  Pertinent labs & imaging results that were available during my care of the patient were reviewed by me and considered in my medical decision making (see chart for details).  85 year old female with PMH as noted above including a history of multiple myeloma presents primarily with nausea and vomiting today, decreased appetite, as well as diarrhea over the last week.  I reviewed the past medical records in [place].  The patient was most recently seen by her hematologist for multiple myeloma in July and was being treated with Revlimid and dexamethasone.  She was most recently admitted in December 2019 with community-acquired pneumonia.  On exam, the patient is somewhat weak appearing with borderline tachycardia but otherwise normal vital signs.  The abdomen is soft with mild discomfort but no focal tenderness.  The remainder of the exam is as described above.  Overall I suspect most likely gastritis, PUD, side effects of her MM treatment, or possible colitis or diverticulitis.  Differential is broad and also includes metabolic etiology, pneumonia or other source of infection, or less likely cardiac cause.  We will obtain lab work-up, CT abdomen, chest x-ray, COVID-19  swab, and reassess.  ----------------------------------------- 3:51 PM on 08/31/2018 -----------------------------------------  Chest x-ray and CT show bilateral interstitial opacities concerning for possible multifocal or viral pneumonia, although the patient has no fever or significant symptoms to suggest pneumonia.  This could be related to her MM.  Hemoglobin is somewhat lower than the patient's baseline.  She has had no further vomiting in the ED.  Given the drop in hemoglobin as well as the possibility  of pneumonia, we will plan for likely admission.  Although the patient does not have symptoms of pneumonia, given the appearance found on the chest x-ray and CT, we will obtain a COVID-19 test prior to admission as the patient would need to be transferred if she was positive.  She is also still pending a urinalysis.  I informed to the patient and her daughter of the findings so far and I signed the patient out to the oncoming physician Dr. Jari Pigg.  ________________________________  Unk Lightning was evaluated in Emergency Department on 08/31/2018 for the symptoms described in the history of present illness. She was evaluated in the context of the global COVID-19 pandemic, which necessitated consideration that the patient might be at risk for infection with the SARS-CoV-2 virus that causes COVID-19. Institutional protocols and algorithms that pertain to the evaluation of patients at risk for COVID-19 are in a state of rapid change based on information released by regulatory bodies including the CDC and federal and state organizations. These policies and algorithms were followed during the patient's care in the ED.  ____________________________________________   FINAL CLINICAL IMPRESSION(S) / ED DIAGNOSES  Final diagnoses:  None      NEW MEDICATIONS STARTED DURING THIS VISIT:  New Prescriptions   No medications on file     Note:  This document was prepared using Dragon voice  recognition software and may include unintentional dictation errors.    Arta Silence, MD 08/31/18 [phone]

## 2018-08-31 NOTE — Plan of Care (Signed)
[facility] doc note:  85 yo F with h/o MM on chronic dexamethasone and ASA 81.  Patient has been feeling ill with COVID symptoms for past 1 week: cough generalized weakness, NB diarrhea. Patient presents to ED with 1 day h/o coffee ground emesis.  HGB 8.3 down from 11.2 X1 month ago.  Spoke with Dr. Watt Climes who doesn't think she needs to go to [facility] for scope or GI consult at this time.  So will go ahead and accept her over here for tele-obs.  He also recd: 1) stop ASA 2) start protonix (looks like shes gotten dose in ED)

## 2018-08-31 NOTE — ED Notes (Signed)
Pharmacy called for Paxil

## 2018-08-31 NOTE — ED Notes (Signed)
Patient transported to CT 

## 2018-08-31 NOTE — ED Triage Notes (Signed)
Pt arrives via EMS from home after vomiting black emesis this morning- pt has had diarrhea x1 week- pt states she is has not eaten in a couple days- CBG was 66 per EMS and they gave 24ml of d10- cbg now 149

## 2018-08-31 NOTE — ED Notes (Signed)
IV team at bedside 

## 2018-09-01 ENCOUNTER — Inpatient Hospital Stay (HOSPITAL_COMMUNITY)
Admission: AD | Admit: 2018-09-01 | Discharge: 2018-09-13 | DRG: 177 | Disposition: A | Discharge status: Skilled Nursing Facility | Payer: PPO | Source: Other Acute Inpatient Hospital | Attending: Internal Medicine | Admitting: Internal Medicine

## 2018-09-01 ENCOUNTER — Encounter (HOSPITAL_COMMUNITY): Payer: Self-pay

## 2018-09-01 ENCOUNTER — Other Ambulatory Visit: Payer: Self-pay

## 2018-09-01 DIAGNOSIS — S069XAA Unspecified intracranial injury with loss of consciousness status unknown, initial encounter: Secondary | ICD-10-CM | POA: Diagnosis present

## 2018-09-01 DIAGNOSIS — C9002 Multiple myeloma in relapse: Secondary | ICD-10-CM | POA: Diagnosis not present

## 2018-09-01 DIAGNOSIS — R339 Retention of urine, unspecified: Secondary | ICD-10-CM | POA: Diagnosis not present

## 2018-09-01 DIAGNOSIS — S069X9A Unspecified intracranial injury with loss of consciousness of unspecified duration, initial encounter: Secondary | ICD-10-CM | POA: Diagnosis present

## 2018-09-01 DIAGNOSIS — Z8249 Family history of ischemic heart disease and other diseases of the circulatory system: Secondary | ICD-10-CM | POA: Diagnosis not present

## 2018-09-01 DIAGNOSIS — R279 Unspecified lack of coordination: Secondary | ICD-10-CM | POA: Diagnosis not present

## 2018-09-01 DIAGNOSIS — Z7982 Long term (current) use of aspirin: Secondary | ICD-10-CM | POA: Diagnosis not present

## 2018-09-01 DIAGNOSIS — Z803 Family history of malignant neoplasm of breast: Secondary | ICD-10-CM

## 2018-09-01 DIAGNOSIS — R2689 Other abnormalities of gait and mobility: Secondary | ICD-10-CM | POA: Diagnosis not present

## 2018-09-01 DIAGNOSIS — N183 Chronic kidney disease, stage 3 unspecified: Secondary | ICD-10-CM | POA: Diagnosis present

## 2018-09-01 DIAGNOSIS — I129 Hypertensive chronic kidney disease with stage 1 through stage 4 chronic kidney disease, or unspecified chronic kidney disease: Secondary | ICD-10-CM | POA: Diagnosis not present

## 2018-09-01 DIAGNOSIS — R1312 Dysphagia, oropharyngeal phase: Secondary | ICD-10-CM | POA: Diagnosis not present

## 2018-09-01 DIAGNOSIS — I480 Paroxysmal atrial fibrillation: Secondary | ICD-10-CM | POA: Diagnosis present

## 2018-09-01 DIAGNOSIS — Z8051 Family history of malignant neoplasm of kidney: Secondary | ICD-10-CM | POA: Diagnosis not present

## 2018-09-01 DIAGNOSIS — E039 Hypothyroidism, unspecified: Secondary | ICD-10-CM | POA: Diagnosis not present

## 2018-09-01 DIAGNOSIS — D62 Acute posthemorrhagic anemia: Secondary | ICD-10-CM | POA: Diagnosis present

## 2018-09-01 DIAGNOSIS — R4182 Altered mental status, unspecified: Secondary | ICD-10-CM | POA: Diagnosis not present

## 2018-09-01 DIAGNOSIS — Z743 Need for continuous supervision: Secondary | ICD-10-CM | POA: Diagnosis not present

## 2018-09-01 DIAGNOSIS — R52 Pain, unspecified: Secondary | ICD-10-CM | POA: Diagnosis not present

## 2018-09-01 DIAGNOSIS — Z87820 Personal history of traumatic brain injury: Secondary | ICD-10-CM

## 2018-09-01 DIAGNOSIS — R55 Syncope and collapse: Secondary | ICD-10-CM | POA: Diagnosis present

## 2018-09-01 DIAGNOSIS — E038 Other specified hypothyroidism: Secondary | ICD-10-CM | POA: Diagnosis not present

## 2018-09-01 DIAGNOSIS — J1289 Other viral pneumonia: Secondary | ICD-10-CM | POA: Diagnosis not present

## 2018-09-01 DIAGNOSIS — E785 Hyperlipidemia, unspecified: Secondary | ICD-10-CM | POA: Diagnosis not present

## 2018-09-01 DIAGNOSIS — I619 Nontraumatic intracerebral hemorrhage, unspecified: Secondary | ICD-10-CM | POA: Diagnosis present

## 2018-09-01 DIAGNOSIS — C9000 Multiple myeloma not having achieved remission: Secondary | ICD-10-CM | POA: Diagnosis not present

## 2018-09-01 DIAGNOSIS — K920 Hematemesis: Secondary | ICD-10-CM | POA: Diagnosis not present

## 2018-09-01 DIAGNOSIS — E876 Hypokalemia: Secondary | ICD-10-CM | POA: Diagnosis not present

## 2018-09-01 DIAGNOSIS — I10 Essential (primary) hypertension: Secondary | ICD-10-CM | POA: Diagnosis present

## 2018-09-01 DIAGNOSIS — U071 COVID-19: Secondary | ICD-10-CM | POA: Diagnosis not present

## 2018-09-01 DIAGNOSIS — I609 Nontraumatic subarachnoid hemorrhage, unspecified: Secondary | ICD-10-CM

## 2018-09-01 DIAGNOSIS — Z8042 Family history of malignant neoplasm of prostate: Secondary | ICD-10-CM | POA: Diagnosis not present

## 2018-09-01 DIAGNOSIS — R7303 Prediabetes: Secondary | ICD-10-CM | POA: Diagnosis present

## 2018-09-01 DIAGNOSIS — Z880 Allergy status to penicillin: Secondary | ICD-10-CM | POA: Diagnosis not present

## 2018-09-01 DIAGNOSIS — Z66 Do not resuscitate: Secondary | ICD-10-CM | POA: Diagnosis present

## 2018-09-01 DIAGNOSIS — E7849 Other hyperlipidemia: Secondary | ICD-10-CM | POA: Diagnosis not present

## 2018-09-01 DIAGNOSIS — Z800 Family history of malignant neoplasm of digestive organs: Secondary | ICD-10-CM

## 2018-09-01 DIAGNOSIS — J9601 Acute respiratory failure with hypoxia: Secondary | ICD-10-CM | POA: Diagnosis present

## 2018-09-01 DIAGNOSIS — N178 Other acute kidney failure: Secondary | ICD-10-CM | POA: Diagnosis not present

## 2018-09-01 DIAGNOSIS — Z823 Family history of stroke: Secondary | ICD-10-CM

## 2018-09-01 DIAGNOSIS — S069X0D Unspecified intracranial injury without loss of consciousness, subsequent encounter: Secondary | ICD-10-CM | POA: Diagnosis not present

## 2018-09-01 DIAGNOSIS — R2681 Unsteadiness on feet: Secondary | ICD-10-CM | POA: Diagnosis not present

## 2018-09-01 DIAGNOSIS — E7800 Pure hypercholesterolemia, unspecified: Secondary | ICD-10-CM | POA: Diagnosis present

## 2018-09-01 DIAGNOSIS — R41841 Cognitive communication deficit: Secondary | ICD-10-CM | POA: Diagnosis not present

## 2018-09-01 DIAGNOSIS — K922 Gastrointestinal hemorrhage, unspecified: Secondary | ICD-10-CM | POA: Diagnosis present

## 2018-09-01 DIAGNOSIS — S02109A Fracture of base of skull, unspecified side, initial encounter for closed fracture: Secondary | ICD-10-CM | POA: Diagnosis present

## 2018-09-01 DIAGNOSIS — N179 Acute kidney failure, unspecified: Secondary | ICD-10-CM | POA: Diagnosis not present

## 2018-09-01 DIAGNOSIS — M6281 Muscle weakness (generalized): Secondary | ICD-10-CM | POA: Diagnosis not present

## 2018-09-01 DIAGNOSIS — N138 Other obstructive and reflux uropathy: Secondary | ICD-10-CM | POA: Diagnosis not present

## 2018-09-01 DIAGNOSIS — D539 Nutritional anemia, unspecified: Secondary | ICD-10-CM | POA: Diagnosis not present

## 2018-09-01 DIAGNOSIS — I610 Nontraumatic intracerebral hemorrhage in hemisphere, subcortical: Secondary | ICD-10-CM | POA: Diagnosis not present

## 2018-09-01 DIAGNOSIS — F320 Major depressive disorder, single episode, mild: Secondary | ICD-10-CM | POA: Diagnosis not present

## 2018-09-01 DIAGNOSIS — Z79890 Hormone replacement therapy (postmenopausal): Secondary | ICD-10-CM | POA: Diagnosis not present

## 2018-09-01 DIAGNOSIS — R278 Other lack of coordination: Secondary | ICD-10-CM | POA: Diagnosis not present

## 2018-09-01 LAB — CBC WITH DIFFERENTIAL/PLATELET
Abs Immature Granulocytes: 0.03 10*3/uL (ref 0.00–0.07)
Basophils Absolute: 0 10*3/uL (ref 0.0–0.1)
Basophils Relative: 0 %
Eosinophils Absolute: 0 10*3/uL (ref 0.0–0.5)
Eosinophils Relative: 0 %
HCT: 27.5 % — ABNORMAL LOW (ref 36.0–46.0)
Hemoglobin: 8.3 g/dL — ABNORMAL LOW (ref 12.0–15.0)
Immature Granulocytes: 1 %
Lymphocytes Relative: 14 %
Lymphs Abs: 0.5 10*3/uL — ABNORMAL LOW (ref 0.7–4.0)
MCH: 25.7 pg — ABNORMAL LOW (ref 26.0–34.0)
MCHC: 30.2 g/dL (ref 30.0–36.0)
MCV: 85.1 fL (ref 80.0–100.0)
Monocytes Absolute: 0.4 10*3/uL (ref 0.1–1.0)
Monocytes Relative: 10 %
Neutro Abs: 2.8 10*3/uL (ref 1.7–7.7)
Neutrophils Relative %: 75 %
Platelets: 432 10*3/uL — ABNORMAL HIGH (ref 150–400)
RBC: 3.23 MIL/uL — ABNORMAL LOW (ref 3.87–5.11)
RDW: 18.2 % — ABNORMAL HIGH (ref 11.5–15.5)
WBC: 3.7 10*3/uL — ABNORMAL LOW (ref 4.0–10.5)
nRBC: 0 % (ref 0.0–0.2)

## 2018-09-01 LAB — COMPREHENSIVE METABOLIC PANEL
ALT: 10 U/L (ref 0–44)
AST: 21 U/L (ref 15–41)
Albumin: 2.4 g/dL — ABNORMAL LOW (ref 3.5–5.0)
Alkaline Phosphatase: 51 U/L (ref 38–126)
Anion gap: 14 (ref 5–15)
BUN: 15 mg/dL (ref 8–23)
CO2: 18 mmol/L — ABNORMAL LOW (ref 22–32)
Calcium: 7.3 mg/dL — ABNORMAL LOW (ref 8.9–10.3)
Chloride: 106 mmol/L (ref 98–111)
Creatinine, Ser: 1.14 mg/dL — ABNORMAL HIGH (ref 0.44–1.00)
GFR calc Af Amer: 50 mL/min — ABNORMAL LOW (ref >60)
GFR calc non Af Amer: 43 mL/min — ABNORMAL LOW (ref >60)
Glucose, Bld: 83 mg/dL (ref 70–99)
Potassium: 3.4 mmol/L — ABNORMAL LOW (ref 3.5–5.1)
Sodium: 138 mmol/L (ref 135–145)
Total Bilirubin: 0.7 mg/dL (ref 0.3–1.2)
Total Protein: 6.1 g/dL — ABNORMAL LOW (ref 6.5–8.1)

## 2018-09-01 LAB — ABO/RH: ABO/RH(D): A POS

## 2018-09-01 LAB — TYPE AND SCREEN
ABO/RH(D): A POS
Antibody Screen: NEGATIVE

## 2018-09-01 LAB — D-DIMER, QUANTITATIVE: D-Dimer, Quant: 1.35 ug/mL-FEU — ABNORMAL HIGH (ref 0.00–0.50)

## 2018-09-01 LAB — PROCALCITONIN: Procalcitonin: <0.1 ng/mL

## 2018-09-01 LAB — C-REACTIVE PROTEIN: CRP: 6.2 mg/dL — ABNORMAL HIGH (ref <1.0)

## 2018-09-01 MED ORDER — ONDANSETRON HCL 4 MG PO TABS: 4.0000 mg | ORAL_TABLET | Freq: Four times a day (QID) | ORAL | Status: DC | PRN

## 2018-09-01 MED ORDER — PAROXETINE HCL 20 MG PO TABS
40.0000 mg | ORAL_TABLET | Freq: Every day | ORAL | Status: DC
  Administered 2018-09-01 – 2018-09-12 (×12): 40 mg via ORAL
  Filled 2018-09-01 (×13): qty 2

## 2018-09-01 MED ORDER — ONDANSETRON HCL 4 MG/2ML IJ SOLN: 4.0000 mg | Freq: Four times a day (QID) | INTRAMUSCULAR | Status: DC | PRN

## 2018-09-01 MED ORDER — CALCIUM CARBONATE 1250 (500 CA) MG PO TABS
1.0000 | ORAL_TABLET | Freq: Two times a day (BID) | ORAL | Status: DC
  Administered 2018-09-01 – 2018-09-13 (×24): 500 mg via ORAL
  Filled 2018-09-01 (×27): qty 1

## 2018-09-01 MED ORDER — ACETAMINOPHEN 325 MG PO TABS
650.0000 mg | ORAL_TABLET | Freq: Four times a day (QID) | ORAL | Status: DC | PRN
  Administered 2018-09-03 – 2018-09-08 (×3): 650 mg via ORAL
  Filled 2018-09-01 (×3): qty 2

## 2018-09-01 MED ORDER — ATORVASTATIN CALCIUM 10 MG PO TABS
5.0000 mg | ORAL_TABLET | Freq: Every day | ORAL | Status: DC
  Administered 2018-09-01 – 2018-09-13 (×13): 5 mg via ORAL
  Filled 2018-09-01 (×11): qty 1
  Filled 2018-09-01: qty 0.5
  Filled 2018-09-01: qty 1

## 2018-09-01 MED ORDER — DEXAMETHASONE SODIUM PHOSPHATE 10 MG/ML IJ SOLN
6.0000 mg | INTRAMUSCULAR | Status: AC
  Administered 2018-09-01 – 2018-09-10 (×10): 6 mg via INTRAVENOUS
  Filled 2018-09-01 (×10): qty 1

## 2018-09-01 MED ORDER — CALCIUM GLUCONATE 500 MG PO TABS
1.0000 | ORAL_TABLET | Freq: Two times a day (BID) | ORAL | Status: DC
  Filled 2018-09-01 (×2): qty 1

## 2018-09-01 MED ORDER — PANTOPRAZOLE SODIUM 40 MG PO TBEC
40.0000 mg | DELAYED_RELEASE_TABLET | Freq: Two times a day (BID) | ORAL | Status: DC
  Administered 2018-09-01 – 2018-09-13 (×25): 40 mg via ORAL
  Filled 2018-09-01 (×25): qty 1

## 2018-09-01 MED ORDER — SODIUM CHLORIDE 0.9 % IV SOLN
200.0000 mg | Freq: Once | INTRAVENOUS | Status: AC
  Administered 2018-09-01: 200 mg via INTRAVENOUS
  Filled 2018-09-01: qty 40

## 2018-09-01 MED ORDER — TRAZODONE HCL 50 MG PO TABS
25.0000 mg | ORAL_TABLET | Freq: Every day | ORAL | Status: DC
  Administered 2018-09-01 – 2018-09-12 (×12): 25 mg via ORAL
  Filled 2018-09-01 (×13): qty 1

## 2018-09-01 MED ORDER — VITAMIN D 25 MCG (1000 UNIT) PO TABS
2000.0000 [IU] | ORAL_TABLET | Freq: Two times a day (BID) | ORAL | Status: DC
  Administered 2018-09-01 – 2018-09-13 (×25): 2000 [IU] via ORAL
  Filled 2018-09-01 (×25): qty 2

## 2018-09-01 MED ORDER — GUAIFENESIN-DM 100-10 MG/5ML PO SYRP: 10.0000 mL | ORAL_SOLUTION | ORAL | Status: DC | PRN

## 2018-09-01 MED ORDER — TRAMADOL HCL 50 MG PO TABS: 50.0000 mg | ORAL_TABLET | Freq: Four times a day (QID) | ORAL | Status: DC | PRN

## 2018-09-01 MED ORDER — METOPROLOL TARTRATE 25 MG PO TABS
12.5000 mg | ORAL_TABLET | Freq: Two times a day (BID) | ORAL | Status: DC
  Administered 2018-09-01 – 2018-09-02 (×3): 12.5 mg via ORAL
  Filled 2018-09-01 (×3): qty 1

## 2018-09-01 MED ORDER — POTASSIUM CHLORIDE CRYS ER 20 MEQ PO TBCR
40.0000 meq | EXTENDED_RELEASE_TABLET | Freq: Once | ORAL | Status: AC
  Administered 2018-09-01: 40 meq via ORAL
  Filled 2018-09-01: qty 2

## 2018-09-01 MED ORDER — AMLODIPINE BESYLATE 5 MG PO TABS
5.0000 mg | ORAL_TABLET | Freq: Every day | ORAL | Status: DC
  Administered 2018-09-01: 5 mg via ORAL
  Filled 2018-09-01: qty 1

## 2018-09-01 MED ORDER — LEVOTHYROXINE SODIUM 50 MCG PO TABS
50.0000 ug | ORAL_TABLET | Freq: Every day | ORAL | Status: DC
  Administered 2018-09-01 – 2018-09-13 (×13): 50 ug via ORAL
  Filled 2018-09-01 (×13): qty 1

## 2018-09-01 MED ORDER — SODIUM CHLORIDE 0.9 % IV SOLN
100.0000 mg | INTRAVENOUS | Status: AC
  Administered 2018-09-02 – 2018-09-05 (×4): 100 mg via INTRAVENOUS
  Filled 2018-09-01 (×4): qty 20

## 2018-09-01 NOTE — ED Notes (Signed)
Pt resting quietly in room at this time in NAD.

## 2018-09-01 NOTE — Plan of Care (Signed)
Continue with POC

## 2018-09-01 NOTE — Plan of Care (Signed)
  Problem: Education: Goal: Knowledge of risk factors and measures for prevention of condition will improve Outcome: Progressing   Problem: Coping: Goal: Psychosocial and spiritual needs will be supported Outcome: Progressing   Problem: Respiratory: Goal: Will maintain a patent airway Outcome: Progressing Goal: Complications related to the disease process, condition or treatment will be avoided or minimized Outcome: Progressing   

## 2018-09-01 NOTE — ED Notes (Signed)
Pt asleep in room at this time.

## 2018-09-01 NOTE — Progress Notes (Addendum)
PROGRESS NOTE  Theresa Lynch QQP:[id number] DOB: 20-Dec-1929 DOA: 09/01/2018  PCP: [name], MD  Brief History/Interval Summary: 85 y.o. female with medical history significant of MM.  Patient presents to the ED with c/o N/V for the past 1 day.  Emesis was dark / coffee ground in color and concerning for hematemesis. She has also had  diarrhea for the past 1 week.  Also had productive cough for the past 10 days or more for which her oncologist stopped her revlimid on 7/31.  She was found to be positive for COVID-19.  She was also noted to have a lower hemoglobin than her baseline.  She went into atrial fibrillation with mild RVR while she was in the ED.  She was hospitalized for further management.    Reason for Visit: Atrial fibrillation with RVR.  COVID-19 with acute respiratory failure with hypoxia.  Consultants: Phone discussion with gastroenterology by admitting provider  Procedures: None  Antibiotics: Anti-infectives (From admission, onward)   None       Subjective/Interval History: Patient noted to be lethargic this morning but easily arousable and able to maintain a conversation.  She denies any chest pain or abdominal pain.  Some shortness of breath with cough.  No nausea vomiting since yesterday.  No bowel movement since yesterday.    Assessment/Plan:  Acute Hypoxic Resp. Failure due to Acute Covid 19 Viral Illness  COVID-19 Labs  Recent Labs    09/01/18 0835  DDIMER 1.35*    Lab Results  Component Value Date   SARSCOV2NAA POSITIVE (A) 08/31/2018     Fever: Noted to be afebrile Oxygen requirements: Noted to be on oxygen at 2 L/min.  Saturating in the 90s. Antibacterials: None Remdesivir: Will be ordered as patient is requiring oxygen now and her chest x-ray showed bilateral opacities. Steroids: Dexamethasone 6 mg daily Diuretics: None Actemra: No indication as yet Vitamin C and Zinc: Will be ordered DVT Prophylaxis: SCDs alone  Patient  has been having shortness of breath and cough for a few days.  X-ray did show bilateral opacities.  This morning she is noted to be on oxygen saturating in the early 90s.  We will order Remdesivir.  Continue steroids.  Follow inflammatory markers.  Incentive spirometry.  Mobilization.  Awake prone positioning as much as possible.  Suspected upper GI bleed Patient apparently had coffee-ground emesis.  Hemoglobin was noted to be lower than her baseline.  She has not had any further episodes of coffee-ground emesis.  Her abdomen is benign.  Has not had any melanotic stools.  Case was discussed with gastroenterology overnight.  They did not feel like patient needed an endoscopy soon.  Agree with this assessment as of now.  Hemoglobin is stable this morning.  Avoid antiplatelet agents.  No anticoagulants for now.  Protonix will be continued.  CT scan of the abdomen did not show any acute intra-abdominal findings.  Atrial fibrillation with mild RVR This was noted in the emergency department.  Patient started on beta-blocker.  Monitor heart rate on telemetry.  Patient not a candidate for anticoagulation due to suspected GI bleed.  Will check TSH.  Likely not a candidate for advanced treatments due to other comorbidities and poor overall prognosis.  Hold off on echocardiogram.    Acute blood loss anemia Baseline hemoglobin is between 10 and 11.  Noted to be 8.3 at admission.  Stable this morning.  Continue to monitor.  Transfuse if it drops below 7.  History of multiple myeloma  Patient was on Revlimid recently.  Discontinued on July 31.  According to notes in EMR patient has been transition to palliative care.  She has stopped active medical treatment for her cancer.  Hypokalemia Will be repleted.  Magnesium was 1.9.  Essential hypertension Monitor blood pressures closely.  Will hold her amlodipine as the patient has been started on metoprolol.   DVT Prophylaxis: SCDs PUD Prophylaxis: Protonix Code  Status: DNR Family Communication: Discussed with the patient.  We will call her daughter. Disposition Plan: Mobilize.  Hopefully patient will be able to return home once she has improved.   Medications:  Scheduled:  amLODipine  5 mg Oral Daily   atorvastatin  5 mg Oral Daily   calcium gluconate  1 tablet Oral BID   cholecalciferol  2,000 Units Oral BID   dexamethasone (DECADRON) injection  6 mg Intravenous Q24H   levothyroxine  50 mcg Oral Daily   metoprolol tartrate  12.5 mg Oral BID   pantoprazole  40 mg Oral BID   PARoxetine  40 mg Oral QHS   traZODone  25 mg Oral QHS   Continuous:  EKC:MKLKJZPHXTAVW, guaiFENesin-dextromethorphan, ondansetron **OR** ondansetron (ZOFRAN) IV, traMADol   Objective:  Vital Signs  Vitals:   09/01/18 0755  BP: 117/74  Pulse: (!) 108  Resp: 18  Temp: 98.6 F (37 C)  TempSrc: Oral  SpO2: 98%    Intake/Output Summary (Last 24 hours) at 09/01/2018 1022 Last data filed at 09/01/2018 0946 Gross per 24 hour  Intake --  Output 200 ml  Net -200 ml   There were no vitals filed for this visit.  General appearance: Awake alert.  Noted to be fatigued.  Easily arousable. Resp: Coarse breath sounds bilaterally.  Mildly tachypneic at rest.  Crackles at the bases.  No wheezing or rhonchi.   Cardio: S1-S2 is irregularly irregular.  Mildly tachycardic.  No S3-S4.  Telemetry shows atrial fibrillation GI: Abdomen is soft.  Nontender nondistended.  Bowel sounds are present normal.  No masses organomegaly Extremities: No edema.  Full range of motion of lower extremities. Neurologic: Alert and oriented x3.  No focal neurological deficits.    Lab Results:  Data Reviewed: I have personally reviewed following labs and imaging studies  CBC: Recent Labs  Lab 08/31/18 1256 08/31/18 2051 09/01/18 0835  WBC 3.5*  --  3.7*  NEUTROABS  --   --  2.8  HGB 8.3* 8.4* 8.3*  HCT 27.3* 27.3* 27.5*  MCV 82.5  --  85.1  PLT 400  --  432*    Basic  Metabolic Panel: Recent Labs  Lab 08/31/18 1256 08/31/18 2051 09/01/18 0835  NA 133*  --  138  K 2.8*  --  3.4*  CL 102  --  106  CO2 18*  --  18*  GLUCOSE 110*  --  83  BUN 21  --  15  CREATININE 1.27*  --  1.14*  CALCIUM 7.2*  --  7.3*  MG  --  1.9  --     GFR: Estimated Creatinine Clearance: 32.5 mL/min (A) (by C-G formula based on SCr of 1.14 mg/dL (H)).  Liver Function Tests: Recent Labs  Lab 08/31/18 1256 09/01/18 0835  AST 28 21  ALT 10 10  ALKPHOS 46 51  BILITOT 0.8 0.7  PROT 5.9* 6.1*  ALBUMIN 2.3* 2.4*    Recent Labs  Lab 08/31/18 1256  LIPASE 42    Coagulation Profile: Recent Labs  Lab 08/31/18 1542  INR 1.3*  Recent Results (from the past 240 hour(s))  SARS Coronavirus 2 [facility] order, Performed in [facility] hospital lab) Nasopharyngeal Nasopharyngeal Swab     Status: Abnormal   Collection Time: 08/31/18  3:40 PM   Specimen: Nasopharyngeal Swab  Result Value Ref Range Status   SARS Coronavirus 2 POSITIVE (A) NEGATIVE Final    Comment: RESULT CALLED TO, READ BACK BY AND VERIFIED WITH: STEPHEN JONES '@[id number]'  ON 08/31/2018 BY FMW (NOTE) If result is NEGATIVE SARS-CoV-2 target nucleic acids are NOT DETECTED. The SARS-CoV-2 RNA is generally detectable in upper and lower  respiratory specimens during the acute phase of infection. The lowest  concentration of SARS-CoV-2 viral copies this assay can detect is 250  copies / mL. A negative result does not preclude SARS-CoV-2 infection  and should not be used as the sole basis for treatment or other  patient management decisions.  A negative result may occur with  improper specimen collection / handling, submission of specimen other  than nasopharyngeal swab, presence of viral mutation(s) within the  areas targeted by this assay, and inadequate number of viral copies  (<250 copies / mL). A negative result must be combined with clinical  observations, patient history, and epidemiological  information. If result is POSITIVE SARS-CoV-2 target nucleic acids are DETECTE D. The SARS-CoV-2 RNA is generally detectable in upper and lower  respiratory specimens during the acute phase of infection.  Positive  results are indicative of active infection with SARS-CoV-2.  Clinical  correlation with patient history and other diagnostic information is  necessary to determine patient infection status.  Positive results do  not rule out bacterial infection or co-infection with other viruses. If result is PRESUMPTIVE POSTIVE SARS-CoV-2 nucleic acids MAY BE PRESENT.   A presumptive positive result was obtained on the submitted specimen  and confirmed on repeat testing.  While 2019 novel coronavirus  (SARS-CoV-2) nucleic acids may be present in the submitted sample  additional confirmatory testing may be necessary for epidemiological  and / or clinical management purposes  to differentiate between  SARS-CoV-2 and other Sarbecovirus currently known to infect humans.  If clinically indicated additional testing with an alternate test  methodology (LAB745 3) is advised. The SARS-CoV-2 RNA is generally  detectable in upper and lower respiratory specimens during the acute  phase of infection. The expected result is Negative. Fact Sheet for Patients:  StrictlyIdeas.no Fact Sheet for Healthcare Providers: BankingDealers.co.za This test is not yet approved or cleared by the [place] FDA and has been authorized for detection and/or diagnosis of SARS-CoV-2 by FDA under an Emergency Use Authorization (EUA).  This EUA will remain in effect (meaning this test can be used) for the duration of the COVID-19 declaration under Section 564(b)(1) of the Act, 21 U.S.C. section 360bbb-3(b)(1), unless the authorization is terminated or revoked sooner. Performed at [facility], [address] [place], [place] [address]       Radiology  Studies: Ct Abdomen Pelvis Wo Contrast  Result Date: 08/31/2018 CLINICAL DATA:  Abdominal pain. EXAM: CT ABDOMEN AND PELVIS WITHOUT CONTRAST TECHNIQUE: Multidetector CT imaging of the abdomen and pelvis was performed following the standard protocol without IV contrast. COMPARISON:  None. FINDINGS: Lower chest: There is a small right and a trace left pleural effusion. There are diffuse bilateral ground-glass airspace opacities, most notable involving the right lower lobe. There is some interlobular septal thickening involving the right lower lobes.The heart size is normal. Hepatobiliary: The liver is normal. The gallbladder is folded. There may  be some gallbladder sludge. There is no CT evidence for acute cholecystitis.There is no biliary ductal dilation. Pancreas: Normal contours without ductal dilatation. No peripancreatic fluid collection. Spleen: No splenic laceration or hematoma. Adrenals/Urinary Tract: --Adrenal glands: No adrenal hemorrhage. --Right kidney/ureter: No hydronephrosis or perinephric hematoma. --Left kidney/ureter: No hydronephrosis or perinephric hematoma. --Urinary bladder: Unremarkable. Stomach/Bowel: --Stomach/Duodenum: There is a large hiatal hernia. --Small bowel: No dilatation or inflammation. --Colon: Rectosigmoid diverticulosis without acute inflammation. --Appendix: Not visualized. No right lower quadrant inflammation or free fluid. Vascular/Lymphatic: Atherosclerotic calcification is present within the non-aneurysmal abdominal aorta, without hemodynamically significant stenosis. --No retroperitoneal lymphadenopathy. --No mesenteric lymphadenopathy. --No pelvic or inguinal lymphadenopathy. Reproductive: Unremarkable Other: No ascites or free air. The abdominal wall is normal. Musculoskeletal. There is an intramedullary nail in the proximal left femur. There are old healed or healing fractures of of the left pubic rami. There is a healed or healing insufficiency fracture of the left  sacrum. There are advanced degenerative changes throughout the visualized lumbar spine. There is diffuse osteopenia. IMPRESSION: 1. Diffuse bilateral ground-glass airspace opacities involving the visualized lung bases. There are trace to small bilateral pleural effusions, right greater than left. Differential considerations include pulmonary edema versus an atypical infectious process such as viral pneumonia. 2. Large hiatal hernia. 3. Probable gallbladder sludge without CT evidence for acute cholecystitis. 4. Sigmoid diverticulosis without CT evidence for diverticulitis. 5. Multiple healed/healing pelvic fractures as detailed above. No acute displaced fracture identified on today's exam. Electronically Signed   By: Constance Holster M.D.   On: 08/31/2018 15:20   Dg Chest Portable 1 View  Result Date: 08/31/2018 CLINICAL DATA:  Weakness EXAM: PORTABLE CHEST 1 VIEW COMPARISON:  January 13, 2018 FINDINGS: There are multifocal airspace opacities bilaterally. There are prominent interstitial lung markings. The heart size is enlarged. There is no acute osseous abnormality. Aortic calcifications are suspected. There is a small right-sided pleural effusion. There is a trace left-sided pleural effusion. IMPRESSION: 1. Multifocal airspace opacities concerning for multifocal pneumonia (viral or bacterial) or pulmonary edema. 2. Cardiomegaly. 3. Trace to small bilateral pleural effusions. Electronically Signed   By: Constance Holster M.D.   On: 08/31/2018 15:36       LOS: 1 day   [facility] Pager on [email or web address]  09/01/2018, 10:22 AM

## 2018-09-01 NOTE — ED Notes (Signed)
Rounded on pt. Gave daughter some water. Offered pt drink and/or crackers. Pt declined.

## 2018-09-01 NOTE — ED Notes (Signed)
Report given to [facility] with an ETA of 30-40 minutes.

## 2018-09-01 NOTE — H&P (Signed)
History and Physical    [name] BVQ:[id number] DOB: 27-Oct-1929 DOA: 09/01/2018  PCP: [name], MD  Patient coming from: Home  I have personally briefly reviewed patient's old medical records in [place]  Chief Complaint: Coffee Ground emesis  HPI: Theresa Lynch is a 85 y.o. female with medical history significant of MM.  Patient presents to the ED with c/o N/V for the past 1 day.  Emesis is dark / coffee ground in color and concerning for hematemesis.  She has also had NB diarrhea for the past 1 week.  Also had productive cough for the past 10 days or more for which her oncologist stopped her revlimid on 7/31.  No abd pain, does have decreased appetite and increased generalized weakness.  ED Course: COVID positive.  HGB has dropped from 11.2 last month to 8.3 today.  K 2.8, given 40 meq PO  She takes ASA 81, no other blood thinners.  While in the ED she also went into new onset A.Fib with mild RVR rate 110-120.   Review of Systems: As per HPI, otherwise all review of systems negative.  Past Medical History:  Diagnosis Date   Arthritis    hands   Cataract    Claustrophobia    Depression    with anxiety/h/o agoraphobia   Glaucoma    Hip fracture (HCC)    left hip   HOH (hard of hearing)    Hyperlipidemia    Hypertension    Multiple myeloma (HCC)    PONV (postoperative nausea and vomiting)     Past Surgical History:  Procedure Laterality Date   CATARACT EXTRACTION W/PHACO Right 03/15/2015   Procedure: CATARACT EXTRACTION PHACO AND INTRAOCULAR LENS PLACEMENT ([place]);  Surgeon: Ronnell Freshwater, MD;  Location: [place];  Service: Ophthalmology;  Laterality: Right;   HIP SURGERY     INTRAMEDULLARY (IM) NAIL INTERTROCHANTERIC Left 05/25/2017   Procedure: INTRAMEDULLARY (IM) NAIL INTERTROCHANTRIC;  Surgeon: Thornton Park, MD;  Location: [facility];  Service: Orthopedics;  Laterality: Left;   [place]   bilateral     reports that she has never smoked. She has never used smokeless tobacco. She reports current alcohol use of about 7.0 standard drinks of alcohol per week. She reports that she does not use drugs.  Allergies  Allergen Reactions   Codeine Nausea And Vomiting   Penicillins Swelling and Other (See Comments)    Lips swell Has patient had a PCN reaction causing immediate rash, facial/tongue/throat swelling, SOB or lightheadedness with hypotension: Yes Has patient had a PCN reaction causing severe rash involving mucus membranes or skin necrosis: No Has patient had a PCN reaction that required hospitalization: No Has patient had a PCN reaction occurring within the last 10 years: No If all of the above answers are "NO", then may proceed with Cephalosporin use.     Family History  Problem Relation Age of Onset   Cancer - Other Son        appendix cancer   Prostate cancer Brother    CAD Brother    Kidney cancer Father    Breast cancer Sister    Stroke Sister      Prior to Admission medications   Medication Sig Start Date End Date Taking? Authorizing Provider  amLODipine (NORVASC) 5 MG tablet Take 1 tablet (5 mg total) by mouth daily. 06/01/17   Gladstone Lighter, MD  aspirin EC 81 MG tablet Take 1 tablet (81 mg  total) by mouth daily. 07/02/17   Earlie Server, MD  atorvastatin (LIPITOR) 10 MG tablet Take 5 mg by mouth daily.     [provider]  calcium gluconate 500 MG tablet Take 1 tablet by mouth 2 (two) times daily.    [provider]  cholecalciferol (VITAMIN D3) 25 MCG (1000 UT) tablet Take 2,000 Units by mouth 2 (two) times daily.     [provider]  dexamethasone (DECADRON) 4 MG tablet TAKE ONE TABLET THREE TIMES A WEEK 06/19/18   Cammie Sickle, MD  lenalidomide (REVLIMID) 5 MG capsule Take 1 capsule (5 mg total) by mouth daily. Take for 14 days on, then 7 days off. 08/01/18   Cammie Sickle, MD  levothyroxine  (SYNTHROID) 50 MCG tablet Take 1 tablet by mouth daily. 07/09/18   [provider]  PARoxetine (PAXIL) 40 MG tablet Take 40 mg by mouth at bedtime. 09/05/12   Love, Ivan Anchors, PA-C  polyethylene glycol (MIRALAX / GLYCOLAX) packet Take 17 g by mouth daily. 06/01/17   Gladstone Lighter, MD  traMADol (ULTRAM) 50 MG tablet Take 1 tablet (50 mg total) by mouth every 6 (six) hours as needed for moderate pain or severe pain. 05/31/17   Gladstone Lighter, MD  traZODone (DESYREL) 50 MG tablet Take 25 mg by mouth at bedtime.     [provider]  vitamin B-12 (CYANOCOBALAMIN) 1000 MCG tablet Take 1,000 mcg by mouth daily.    [provider]    Physical Exam: There were no vitals filed for this visit.  Constitutional: NAD, calm, comfortable Eyes: PERRL, lids and conjunctivae normal ENMT: Mucous membranes are moist. Posterior pharynx clear of any exudate or lesions.Normal dentition.  Neck: normal, supple, no masses, no thyromegaly Respiratory: clear to auscultation bilaterally, no wheezing, no crackles. Normal respiratory effort. No accessory muscle use.  Cardiovascular: IRR, IRR no murmurs / rubs / gallops. No extremity edema. 2+ pedal pulses. No carotid bruits.  Abdomen: no tenderness, no masses palpated. No hepatosplenomegaly. Bowel sounds positive.  Musculoskeletal: no clubbing / cyanosis. No joint deformity upper and lower extremities. Good ROM, no contractures. Normal muscle tone.  Skin: no rashes, lesions, ulcers. No induration Neurologic: CN 2-12 grossly intact. Sensation intact, DTR normal. Strength 5/5 in all 4.  Psychiatric: Normal judgment and insight. Alert and oriented x 3. Normal mood.    Labs on Admission: I have personally reviewed following labs and imaging studies  CBC: Recent Labs  Lab 08/31/18 1256 08/31/18 2051  WBC 3.5*  --   HGB 8.3* 8.4*  HCT 27.3* 27.3*  MCV 82.5  --   PLT 400  --    Basic Metabolic Panel: Recent Labs  Lab 08/31/18 1256  08/31/18 2051  NA 133*  --   K 2.8*  --   CL 102  --   CO2 18*  --   GLUCOSE 110*  --   BUN 21  --   CREATININE 1.27*  --   CALCIUM 7.2*  --   MG  --  1.9   GFR: Estimated Creatinine Clearance: 29.2 mL/min (A) (by C-G formula based on SCr of 1.27 mg/dL (H)). Liver Function Tests: Recent Labs  Lab 08/31/18 1256  AST 28  ALT 10  ALKPHOS 46  BILITOT 0.8  PROT 5.9*  ALBUMIN 2.3*   Recent Labs  Lab 08/31/18 1256  LIPASE 42   No results for input(s): AMMONIA in the last 168 hours. Coagulation Profile: Recent Labs  Lab 08/31/18 1542  INR 1.3*   Cardiac Enzymes: No results for input(s): CKTOTAL, CKMB, CKMBINDEX, TROPONINI in the last 168 hours. BNP (last 3 results) No results for input(s): PROBNP in the last 8760 hours. HbA1C: No results for input(s): HGBA1C in the last 72 hours. CBG: No results for input(s): GLUCAP in the last 168 hours. Lipid Profile: No results for input(s): CHOL, HDL, LDLCALC, TRIG, CHOLHDL, LDLDIRECT in the last 72 hours. Thyroid Function Tests: No results for input(s): TSH, T4TOTAL, FREET4, T3FREE, THYROIDAB in the last 72 hours. Anemia Panel: No results for input(s): VITAMINB12, FOLATE, FERRITIN, TIBC, IRON, RETICCTPCT in the last 72 hours. Urine analysis:    Component Value Date/Time   COLORURINE YELLOW (A) 08/31/2018 1630   APPEARANCEUR HAZY (A) 08/31/2018 1630   LABSPEC 1.005 08/31/2018 1630   PHURINE 6.0 08/31/2018 1630   GLUCOSEU NEGATIVE 08/31/2018 1630   HGBUR LARGE (A) 08/31/2018 1630   BILIRUBINUR NEGATIVE 08/31/2018 1630   KETONESUR 5 (A) 08/31/2018 1630   PROTEINUR NEGATIVE 08/31/2018 1630   UROBILINOGEN 1.0 08/20/2012 2305   NITRITE NEGATIVE 08/31/2018 1630   LEUKOCYTESUR TRACE (A) 08/31/2018 1630    Radiological Exams on Admission: Ct Abdomen Pelvis Wo Contrast  Result Date: 08/31/2018 CLINICAL DATA:  Abdominal pain. EXAM: CT ABDOMEN AND PELVIS WITHOUT CONTRAST TECHNIQUE: Multidetector CT imaging of the abdomen and  pelvis was performed following the standard protocol without IV contrast. COMPARISON:  None. FINDINGS: Lower chest: There is a small right and a trace left pleural effusion. There are diffuse bilateral ground-glass airspace opacities, most notable involving the right lower lobe. There is some interlobular septal thickening involving the right lower lobes.The heart size is normal. Hepatobiliary: The liver is normal. The gallbladder is folded. There may be some gallbladder sludge. There is no CT evidence for acute cholecystitis.There is no biliary ductal dilation. Pancreas: Normal contours without ductal dilatation. No peripancreatic fluid collection. Spleen: No splenic laceration or hematoma. Adrenals/Urinary Tract: --Adrenal glands: No adrenal hemorrhage. --Right kidney/ureter: No hydronephrosis or perinephric hematoma. --Left kidney/ureter: No hydronephrosis or perinephric hematoma. --Urinary bladder: Unremarkable. Stomach/Bowel: --Stomach/Duodenum: There is a large hiatal hernia. --Small bowel: No dilatation or inflammation. --Colon: Rectosigmoid diverticulosis without acute inflammation. --Appendix: Not visualized. No right lower quadrant inflammation or free fluid. Vascular/Lymphatic: Atherosclerotic calcification is present within the non-aneurysmal abdominal aorta, without hemodynamically significant stenosis. --No retroperitoneal lymphadenopathy. --No mesenteric lymphadenopathy. --No pelvic or inguinal lymphadenopathy. Reproductive: Unremarkable Other: No ascites or free air. The abdominal wall is normal. Musculoskeletal. There is an intramedullary nail in the proximal left femur. There are old healed or healing fractures of of the left pubic rami. There is a healed or healing insufficiency fracture of the left sacrum. There are advanced degenerative changes throughout the visualized lumbar spine. There is diffuse osteopenia. IMPRESSION: 1. Diffuse bilateral ground-glass airspace opacities involving the  visualized lung bases. There are trace to small bilateral pleural effusions, right greater than left. Differential considerations include pulmonary edema versus an atypical infectious process such as viral pneumonia. 2. Large hiatal hernia. 3. Probable gallbladder sludge without CT evidence for acute cholecystitis. 4. Sigmoid diverticulosis without CT evidence for diverticulitis. 5. Multiple healed/healing pelvic fractures as detailed above. No acute displaced fracture identified on today's exam. Electronically Signed   By: Constance Holster M.D.   On: 08/31/2018 15:20   Dg Chest Portable 1 View  Result Date: 08/31/2018 CLINICAL DATA:  Weakness EXAM: PORTABLE CHEST 1 VIEW COMPARISON:  January 13, 2018 FINDINGS: There are multifocal airspace opacities bilaterally. There are prominent interstitial lung markings.  The heart size is enlarged. There is no acute osseous abnormality. Aortic calcifications are suspected. There is a small right-sided pleural effusion. There is a trace left-sided pleural effusion. IMPRESSION: 1. Multifocal airspace opacities concerning for multifocal pneumonia (viral or bacterial) or pulmonary edema. 2. Cardiomegaly. 3. Trace to small bilateral pleural effusions. Electronically Signed   By: Constance Holster M.D.   On: 08/31/2018 15:36    EKG: Independently reviewed.  Assessment/Plan Principal Problem:   UGIB (upper gastrointestinal bleed) Active Problems:   Multiple myeloma in relapse ([place])   COVID-19 virus infection   PAF (paroxysmal atrial fibrillation) ([place])    1. UGIB - 1. Spoke with Dr. Watt Climes: 1. doesn't think she needs to go to [facility] for scope or GI consult at this time. 2. Stop ASA 3. Start protonix 2. Repeat HGB now, no further emesis since arrival to ED around noon yesterday, also HGB thus far has remained in the 8 range 3. Will go ahead and let patient eat 4. Tele monitor 5. Type and screen 2. COVID-19 1. Sounds like shes having mostly GI symptoms with  diarrhea for past 1 week and N/V yesterday 2. Hold her chronic laxatives 3. Zofran PRN 4. COVID pathway 3. MM - 1. Revlemid was stopped 7/31 2. Takes decadron 39m 3 times weekly 3. Will just go ahead and put her on decadron [id number] for the moment since shes here with COVID. 4. New onset A.Fib - 1. Asymptomatic and rate in the 110s, newly onset while she was in ED. 2. Will start metoprolol 12.68m70mID PO 3. Will NOT start anticoagulation right now since she is here with a suspected GIB and HGB drop. 5. Hypokalemia - 1. Repeat CMP now, got 42m62mO K in ED  DVT prophylaxis: SCDs Code Status: Full Family Communication: No family in room Disposition Plan: Home after admit Consults called: Spoke with Dr. MagoWatt Climesr phone Admission status: Place in obs    , [facility]  How to contact the [facility]ending or Consulting provider 7A -[place]covering provider during after hours 7P -Bladesr this patient?  1. Check the care team in [facility] look for a) attending/consulting [facility] provider listed and b) the [facility] listed 2. Log into [email or web address]  Amion Physician Scheduling and messaging for groups and whole hospitals  On call and physician scheduling software for group practices, residents, hospitalists and other medical providers for call, clinic, rotation and shift schedules. On[employer] is a hospital-wide system for scheduling doctors and paging doctors on call. EasyPlot is for scientific plotting and data analysis.  [email or web address]  and use [place]'s universal password to access. If you do not have the password, please contact the hospital operator.  3. Locate the [facility] you are looking for under [facility] and page to a number that you can be directly reached. 4. If you still have difficulty reaching the provider, please page the [facility] on Call) for the Hospitalists listed on amion for assistance.  09/01/2018, 5:56 AM

## 2018-09-01 NOTE — Progress Notes (Signed)
MD [place] Pink notified in [place] chat of pt's urine frequency and is aware of UA. NNO obtained at this time

## 2018-09-02 ENCOUNTER — Other Ambulatory Visit: Payer: Self-pay | Admitting: Student

## 2018-09-02 ENCOUNTER — Telehealth: Payer: Self-pay | Admitting: Internal Medicine

## 2018-09-02 ENCOUNTER — Telehealth: Payer: Self-pay | Admitting: Nurse Practitioner

## 2018-09-02 DIAGNOSIS — J1289 Other viral pneumonia: Secondary | ICD-10-CM

## 2018-09-02 DIAGNOSIS — C9000 Multiple myeloma not having achieved remission: Secondary | ICD-10-CM

## 2018-09-02 DIAGNOSIS — J9601 Acute respiratory failure with hypoxia: Secondary | ICD-10-CM

## 2018-09-02 LAB — CBC WITH DIFFERENTIAL/PLATELET
Abs Immature Granulocytes: 0.02 10*3/uL (ref 0.00–0.07)
Basophils Absolute: 0 10*3/uL (ref 0.0–0.1)
Basophils Relative: 1 %
Eosinophils Absolute: 0 10*3/uL (ref 0.0–0.5)
Eosinophils Relative: 0 %
HCT: 27.5 % — ABNORMAL LOW (ref 36.0–46.0)
Hemoglobin: 8 g/dL — ABNORMAL LOW (ref 12.0–15.0)
Immature Granulocytes: 1 %
Lymphocytes Relative: 20 %
Lymphs Abs: 0.8 10*3/uL (ref 0.7–4.0)
MCH: 25.2 pg — ABNORMAL LOW (ref 26.0–34.0)
MCHC: 29.1 g/dL — ABNORMAL LOW (ref 30.0–36.0)
MCV: 86.5 fL (ref 80.0–100.0)
Monocytes Absolute: 0.4 10*3/uL (ref 0.1–1.0)
Monocytes Relative: 10 %
Neutro Abs: 2.7 10*3/uL (ref 1.7–7.7)
Neutrophils Relative %: 68 %
Platelets: 469 10*3/uL — ABNORMAL HIGH (ref 150–400)
RBC: 3.18 MIL/uL — ABNORMAL LOW (ref 3.87–5.11)
RDW: 18.4 % — ABNORMAL HIGH (ref 11.5–15.5)
WBC: 3.9 10*3/uL — ABNORMAL LOW (ref 4.0–10.5)
nRBC: 0.5 % — ABNORMAL HIGH (ref 0.0–0.2)

## 2018-09-02 LAB — URINE CULTURE

## 2018-09-02 LAB — COMPREHENSIVE METABOLIC PANEL
ALT: 11 U/L (ref 0–44)
AST: 19 U/L (ref 15–41)
Albumin: 2.5 g/dL — ABNORMAL LOW (ref 3.5–5.0)
Alkaline Phosphatase: 46 U/L (ref 38–126)
Anion gap: 11 (ref 5–15)
BUN: 18 mg/dL (ref 8–23)
CO2: 20 mmol/L — ABNORMAL LOW (ref 22–32)
Calcium: 7.3 mg/dL — ABNORMAL LOW (ref 8.9–10.3)
Chloride: 107 mmol/L (ref 98–111)
Creatinine, Ser: 1.14 mg/dL — ABNORMAL HIGH (ref 0.44–1.00)
GFR calc Af Amer: 50 mL/min — ABNORMAL LOW (ref >60)
GFR calc non Af Amer: 43 mL/min — ABNORMAL LOW (ref >60)
Glucose, Bld: 81 mg/dL (ref 70–99)
Potassium: 3.7 mmol/L (ref 3.5–5.1)
Sodium: 138 mmol/L (ref 135–145)
Total Bilirubin: 0.7 mg/dL (ref 0.3–1.2)
Total Protein: 5.9 g/dL — ABNORMAL LOW (ref 6.5–8.1)

## 2018-09-02 LAB — T4, FREE: Free T4: 1.56 ng/dL — ABNORMAL HIGH (ref 0.61–1.12)

## 2018-09-02 LAB — D-DIMER, QUANTITATIVE: D-Dimer, Quant: 1.5 ug/mL-FEU — ABNORMAL HIGH (ref 0.00–0.50)

## 2018-09-02 LAB — TSH: TSH: 0.753 u[IU]/mL (ref 0.350–4.500)

## 2018-09-02 LAB — C-REACTIVE PROTEIN: CRP: 6.4 mg/dL — ABNORMAL HIGH (ref <1.0)

## 2018-09-02 MED ORDER — POTASSIUM CHLORIDE CRYS ER 20 MEQ PO TBCR
40.0000 meq | EXTENDED_RELEASE_TABLET | Freq: Once | ORAL | Status: AC
  Administered 2018-09-02: 40 meq via ORAL
  Filled 2018-09-02: qty 2

## 2018-09-02 MED ORDER — METOPROLOL TARTRATE 25 MG PO TABS
25.0000 mg | ORAL_TABLET | Freq: Two times a day (BID) | ORAL | Status: DC
  Administered 2018-09-02 – 2018-09-13 (×22): 25 mg via ORAL
  Filled 2018-09-02 (×23): qty 1

## 2018-09-02 NOTE — Progress Notes (Signed)
PROGRESS NOTE  Theresa Lynch OVF:[id number] DOB: [birth year] DOA: 09/01/2018  PCP: [name], MD  Brief History/Interval Summary: 85 y.o. female with medical history significant of MM.  Patient presents to the ED with c/o N/V for the past 1 day.  Emesis was dark / coffee ground in color and concerning for hematemesis. She has also had  diarrhea for the past 1 week.  Also had productive cough for the past 10 days or more for which her oncologist stopped her revlimid on 7/31.  She was found to be positive for COVID-19.  She was also noted to have a lower hemoglobin than her baseline.  She went into atrial fibrillation with mild RVR while she was in the ED.  She was hospitalized for further management.    Reason for Visit: Atrial fibrillation with RVR.  COVID-19 with acute respiratory failure with hypoxia.  Consultants: Phone discussion with gastroenterology by admitting provider  Procedures: None  Antibiotics: Anti-infectives (From admission, onward)   Start     Dose/Rate Route Frequency Ordered Stop   09/02/18 1400  remdesivir 100 mg in sodium chloride 0.9 % 250 mL IVPB     100 mg 500 mL/hr over 30 Minutes Intravenous Every 24 hours 09/01/18 1232 09/06/18 1359   09/01/18 1400  remdesivir 200 mg in sodium chloride 0.9 % 250 mL IVPB     200 mg 500 mL/hr over 30 Minutes Intravenous Once 09/01/18 1232 09/01/18 1900       Subjective/Interval History: Patient noted to be a little bit more awake and alert this morning compared to yesterday.  She denies any further episodes of nausea or vomiting.  No abdominal pain.  No shortness of breath.      Assessment/Plan:  Acute Hypoxic Resp. Failure due to Acute Covid 19 Viral Illness  COVID-19 Labs  Recent Labs    09/01/18 0835 09/02/18 0220  DDIMER 1.35* 1.50*  CRP 6.2* 6.4*    Lab Results  Component Value Date   SARSCOV2NAA POSITIVE (A) 08/31/2018     Fever: Remains afebrile Oxygen requirements: Nasal cannula.  2  L/min.  Saturating in the 90s.   Antibacterials: None Remdesivir: Day 2 today Steroids: Dexamethasone 6 mg daily Diuretics: None Actemra: No indication as yet DVT Prophylaxis: SCDs alone  Patient has remained stable from a respiratory standpoint.  She is still requiring 2 L of oxygen by nasal cannula.  D-dimer 1.50.  CRP still elevated at 6.4.  Continue steroids and Remdesivir.  Incentive spirometry.  Mobilization.  Awake prone positioning as much as possible.  Unable to use Lovenox due to suspected GI bleed.  Suspected upper GI bleed Patient apparently had coffee-ground emesis.  Hemoglobin was noted to be lower than her baseline.  Has not had any episodes of GI bleed in the hospital.  Her hemoglobin slightly lower today but stable for the most part.  Continue with PPI.  No antiplatelets or anticoagulants at this time.  CT scan of the abdomen and pelvis did not show any acute findings.    Atrial fibrillation with mild RVR This was noted in the emergency department.  Patient started on beta-blocker.  Heart rate has been around 90-105.  Remains in atrial fibrillation.  Will increase the dose of beta-blocker.  TSH 0.753.  Free T4 1.56.  Significance of this is not entirely clear in the setting of acute illness.  We will recommend rechecking thyroid function test in a few weeks time.  Not a candidate for anticoagulation due to suspected  GI bleed. Likely not a candidate for advanced treatments due to other comorbidities and poor overall prognosis.  Hold off on echocardiogram.  This was discussed with patient's daughter who is agreeable with this approach.  Acute blood loss anemia Baseline hemoglobin is between 10 and 11.  Noted to be 8.3 at admission.  Hemoglobin stable for the most part.  Stable this morning.  Continue to monitor.  Transfuse if it drops below 7.  History of multiple myeloma Patient was on Revlimid recently.  Discontinued on July 31.  According to notes in EMR patient has been  transition to palliative care.  She has stopped active medical treatment for her cancer.  Will discuss with her oncologist regarding prognosis.  Discussed with her oncologist today.  Apparently her multiple myeloma is mostly in the background.  Will not affect prognosis in this acute setting.  Hypokalemia Potassium level has improved.  Magnesium was 1.9 yesterday.  Additional dose of potassium to be given today.  Essential hypertension Blood pressure reasonably well controlled.  Amlodipine on hold as the patient was started on metoprolol.   DVT Prophylaxis: SCDs PUD Prophylaxis: Protonix Code Status: DNR Family Communication: Her daughter was updated yesterday. Disposition Plan: PT and OT evaluation.  Patient son and daughter-in-law lives with her.  Daughter-in-law recently diagnosed with malignancy.  So disposition remains unclear as of now.   Medications:  Scheduled: . atorvastatin  5 mg Oral Daily  . calcium carbonate  1 tablet Oral BID WC  . cholecalciferol  2,000 Units Oral BID  . dexamethasone (DECADRON) injection  6 mg Intravenous Q24H  . levothyroxine  50 mcg Oral Daily  . metoprolol tartrate  12.5 mg Oral BID  . pantoprazole  40 mg Oral BID  . PARoxetine  40 mg Oral QHS  . traZODone  25 mg Oral QHS   Continuous: . remdesivir 100 mg in NS 250 mL     LJQ:GBEEFEOFHQRFX, guaiFENesin-dextromethorphan, ondansetron **OR** ondansetron (ZOFRAN) IV, traMADol   Objective:  Vital Signs  Vitals:   09/01/18 1836 09/01/18 1921 09/02/18 0424 09/02/18 0833  BP: 119/90 111/72 129/81 121/72  Pulse: 96 (!) 102 89 82  Resp: 20 19 (!) 21 (!) 21  Temp:  98.8 F (37.1 C) 98.2 F (36.8 C) 98.8 F (37.1 C)  TempSrc:  Oral Oral Oral  SpO2: 94% 96% 97% 98%    Intake/Output Summary (Last 24 hours) at 09/02/2018 1020 Last data filed at 09/01/2018 1900 Gross per 24 hour  Intake 480 ml  Output -  Net 480 ml   There were no vitals filed for this visit.  General appearance: Awake  alert.  Fatigued but easily arousable. Resp: Coarse breath sounds bilaterally.  Few crackles at the bases.  Normal effort at rest.  No wheezing or rhonchi.   Cardio: S1-S2 is irregularly irregular.  No S3-S4.  No rubs or bruit.   GI: Abdomen is soft.  Nontender nondistended.  Bowel sounds are present normal.  No masses organomegaly Extremities: No edema.  Full range of motion of lower extremities. Neurologic: No focal neurological deficits.      Lab Results:  Data Reviewed: I have personally reviewed following labs and imaging studies  CBC: Recent Labs  Lab 08/31/18 1256 08/31/18 2051 09/01/18 0835 09/02/18 0220  WBC 3.5*  --  3.7* 3.9*  NEUTROABS  --   --  2.8 2.7  HGB 8.3* 8.4* 8.3* 8.0*  HCT 27.3* 27.3* 27.5* 27.5*  MCV 82.5  --  85.1 86.5  PLT  400  --  432* 469*    Basic Metabolic Panel: Recent Labs  Lab 08/31/18 1256 08/31/18 2051 09/01/18 0835 09/02/18 0220  NA 133*  --  138 138  K 2.8*  --  3.4* 3.7  CL 102  --  106 107  CO2 18*  --  18* 20*  GLUCOSE 110*  --  83 81  BUN 21  --  15 18  CREATININE 1.27*  --  1.14* 1.14*  CALCIUM 7.2*  --  7.3* 7.3*  MG  --  1.9  --   --     GFR: Estimated Creatinine Clearance: 32.5 mL/min (A) (by C-G formula based on SCr of 1.14 mg/dL (H)).  Liver Function Tests: Recent Labs  Lab 08/31/18 1256 09/01/18 0835 09/02/18 0220  AST '28 21 19  ' ALT '10 10 11  ' ALKPHOS 46 51 46  BILITOT 0.8 0.7 0.7  PROT 5.9* 6.1* 5.9*  ALBUMIN 2.3* 2.4* 2.5*    Recent Labs  Lab 08/31/18 1256  LIPASE 42    Coagulation Profile: Recent Labs  Lab 08/31/18 1542  INR 1.3*      Recent Results (from the past 240 hour(s))  SARS Coronavirus 2 [facility] order, Performed in [facility] hospital lab) Nasopharyngeal Nasopharyngeal Swab     Status: Abnormal   Collection Time: 08/31/18  3:40 PM   Specimen: Nasopharyngeal Swab  Result Value Ref Range Status   SARS Coronavirus 2 POSITIVE (A) NEGATIVE Final    Comment: RESULT CALLED TO,  READ BACK BY AND VERIFIED WITH: STEPHEN JONES '@1827'  ON 08/31/2018 BY FMW (NOTE) If result is NEGATIVE SARS-CoV-2 target nucleic acids are NOT DETECTED. The SARS-CoV-2 RNA is generally detectable in upper and lower  respiratory specimens during the acute phase of infection. The lowest  concentration of SARS-CoV-2 viral copies this assay can detect is 250  copies / mL. A negative result does not preclude SARS-CoV-2 infection  and should not be used as the sole basis for treatment or other  patient management decisions.  A negative result may occur with  improper specimen collection / handling, submission of specimen other  than nasopharyngeal swab, presence of viral mutation(s) within the  areas targeted by this assay, and inadequate number of viral copies  (<250 copies / mL). A negative result must be combined with clinical  observations, patient history, and epidemiological information. If result is POSITIVE SARS-CoV-2 target nucleic acids are DETECTE D. The SARS-CoV-2 RNA is generally detectable in upper and lower  respiratory specimens during the acute phase of infection.  Positive  results are indicative of active infection with SARS-CoV-2.  Clinical  correlation with patient history and other diagnostic information is  necessary to determine patient infection status.  Positive results do  not rule out bacterial infection or co-infection with other viruses. If result is PRESUMPTIVE POSTIVE SARS-CoV-2 nucleic acids MAY BE PRESENT.   A presumptive positive result was obtained on the submitted specimen  and confirmed on repeat testing.  While 2019 novel coronavirus  (SARS-CoV-2) nucleic acids may be present in the submitted sample  additional confirmatory testing may be necessary for epidemiological  and / or clinical management purposes  to differentiate between  SARS-CoV-2 and other Sarbecovirus currently known to infect humans.  If clinically indicated additional testing with an  alternate test  methodology (LAB745 3) is advised. The SARS-CoV-2 RNA is generally  detectable in upper and lower respiratory specimens during the acute  phase of infection. The expected result is Negative. Fact Sheet  for Patients:  StrictlyIdeas.no Fact Sheet for Healthcare Providers: BankingDealers.co.za This test is not yet approved or cleared by the [place] FDA and has been authorized for detection and/or diagnosis of SARS-CoV-2 by FDA under an Emergency Use Authorization (EUA).  This EUA will remain in effect (meaning this test can be used) for the duration of the COVID-19 declaration under Section 564(b)(1) of the Act, 21 U.S.C. section 360bbb-3(b)(1), unless the authorization is terminated or revoked sooner. Performed at [facility], [address], [place], [place] [address]   Urine culture     Status: None   Collection Time: 08/31/18  4:30 PM   Specimen: Urine, Clean Catch  Result Value Ref Range Status   Specimen Description   Final    URINE, CLEAN CATCH Performed at [facility], [address] [place], [place] [address]    Special Requests   Final    NONE Performed at [facility], [address] [place], [place] [address]    Culture   Final    Multiple bacterial morphotypes present, none predominant. Suggest appropriate recollection if clinically indicated.   Report Status 09/02/2018 FINAL  Final      Radiology Studies: Ct Abdomen Pelvis Wo Contrast  Result Date: 08/31/2018 CLINICAL DATA:  Abdominal pain. EXAM: CT ABDOMEN AND PELVIS WITHOUT CONTRAST TECHNIQUE: Multidetector CT imaging of the abdomen and pelvis was performed following the standard protocol without IV contrast. COMPARISON:  None. FINDINGS: Lower chest: There is a small right and a trace left pleural effusion. There are diffuse bilateral ground-glass airspace opacities, most notable involving the right lower lobe.  There is some interlobular septal thickening involving the right lower lobes.The heart size is normal. Hepatobiliary: The liver is normal. The gallbladder is folded. There may be some gallbladder sludge. There is no CT evidence for acute cholecystitis.There is no biliary ductal dilation. Pancreas: Normal contours without ductal dilatation. No peripancreatic fluid collection. Spleen: No splenic laceration or hematoma. Adrenals/Urinary Tract: --Adrenal glands: No adrenal hemorrhage. --Right kidney/ureter: No hydronephrosis or perinephric hematoma. --Left kidney/ureter: No hydronephrosis or perinephric hematoma. --Urinary bladder: Unremarkable. Stomach/Bowel: --Stomach/Duodenum: There is a large hiatal hernia. --Small bowel: No dilatation or inflammation. --Colon: Rectosigmoid diverticulosis without acute inflammation. --Appendix: Not visualized. No right lower quadrant inflammation or free fluid. Vascular/Lymphatic: Atherosclerotic calcification is present within the non-aneurysmal abdominal aorta, without hemodynamically significant stenosis. --No retroperitoneal lymphadenopathy. --No mesenteric lymphadenopathy. --No pelvic or inguinal lymphadenopathy. Reproductive: Unremarkable Other: No ascites or free air. The abdominal wall is normal. Musculoskeletal. There is an intramedullary nail in the proximal left femur. There are old healed or healing fractures of of the left pubic rami. There is a healed or healing insufficiency fracture of the left sacrum. There are advanced degenerative changes throughout the visualized lumbar spine. There is diffuse osteopenia. IMPRESSION: 1. Diffuse bilateral ground-glass airspace opacities involving the visualized lung bases. There are trace to small bilateral pleural effusions, right greater than left. Differential considerations include pulmonary edema versus an atypical infectious process such as viral pneumonia. 2. Large hiatal hernia. 3. Probable gallbladder sludge without CT  evidence for acute cholecystitis. 4. Sigmoid diverticulosis without CT evidence for diverticulitis. 5. Multiple healed/healing pelvic fractures as detailed above. No acute displaced fracture identified on today's exam. Electronically Signed   By: Constance Holster M.D.   On: 08/31/2018 15:20   Dg Chest Portable 1 View  Result Date: 08/31/2018 CLINICAL DATA:  Weakness EXAM: PORTABLE CHEST 1 VIEW COMPARISON:  January 13, 2018 FINDINGS: There are multifocal  airspace opacities bilaterally. There are prominent interstitial lung markings. The heart size is enlarged. There is no acute osseous abnormality. Aortic calcifications are suspected. There is a small right-sided pleural effusion. There is a trace left-sided pleural effusion. IMPRESSION: 1. Multifocal airspace opacities concerning for multifocal pneumonia (viral or bacterial) or pulmonary edema. 2. Cardiomegaly. 3. Trace to small bilateral pleural effusions. Electronically Signed   By: Constance Holster M.D.   On: 08/31/2018 15:36       LOS: 1 day   [facility] Pager on [email or web address]  09/02/2018, 10:20 AM

## 2018-09-02 NOTE — Progress Notes (Signed)
Called and updated daughter over the phone.

## 2018-09-02 NOTE — Evaluation (Signed)
Physical Therapy Evaluation Patient Details Name: Theresa Lynch MRN: [id number] DOB: 11/09/1929 Today's Date: 09/02/2018   History of Present Illness  85 yo female presenting to the ED with nausea and vomiting; Emesis is dark / coffee ground in color. COVID positive. PMH including arthritis, depression, left hip fx with IM nail placedment (2019), HTN, and MM.   Clinical Impression  The patient required encouragement to mobilize. Ambulated x 20' Plans to return home.Pt admitted with above diagnosis. Pt currently with functional limitations due to the deficits listed below (see PT Problem List).  Pt will benefit from skilled PT to increase their independence and safety with mobility to allow discharge to the venue listed below.         Follow Up Recommendations Home health PT;Supervision/Assistance - 24 hour    Equipment Recommendations  None recommended by PT    Recommendations for Other Services       Precautions / Restrictions Precautions Precautions: Fall Restrictions Weight Bearing Restrictions: No      Mobility  Bed Mobility Overal bed mobility: Needs Assistance Bed Mobility: Supine to Sit     Supine to sit: Min assist     General bed mobility comments: Min A for elevating trunk  Transfers Overall transfer level: Needs assistance Equipment used: Rolling walker (2 wheeled) Transfers: Sit to/from Stand Sit to Stand: Min guard         General transfer comment: Min Guard A for safety during power up into standing  Ambulation/Gait Ambulation/Gait assistance: Herbalist (Feet): 20 Feet Assistive device: Rolling walker (2 wheeled) Gait Pattern/deviations: Step-to pattern;Step-through pattern;Antalgic     General Gait Details: noted a  "limp on left l;eg  Stairs            Wheelchair Mobility    Modified Rankin (Stroke Patients Only)       Balance Overall balance assessment: Needs assistance Sitting-balance support: No upper  extremity supported;Feet supported Sitting balance-Leahy Scale: Fair     Standing balance support: Bilateral upper extremity supported;During functional activity Standing balance-Leahy Scale: Poor Standing balance comment: Reliant on UE support                             Pertinent Vitals/Pain Pain Assessment: No/denies pain Faces Pain Scale: No hurt Pain Intervention(s): Monitored during session;Limited activity within patient's tolerance;Repositioned    Home Living Family/patient expects to be discharged to:: Private residence Living Arrangements: Children Available Help at Discharge: Family(Son and daughter in law) Type of Home: House Home Access: Stairs to enter     Home Layout: One level Home Equipment: Environmental consultant - 2 wheels      Prior Function Level of Independence: Needs assistance   Gait / Transfers Assistance Needed: Uses RW  ADL's / Homemaking Assistance Needed: Son and daughter in law perform IADLs and assist with bathing and dressing        Hand Dominance   Dominant Hand: Right    Extremity/Trunk Assessment   Upper Extremity Assessment Upper Extremity Assessment: Generalized weakness    Lower Extremity Assessment Lower Extremity Assessment: Generalized weakness(left  Leg descrepency)    Cervical / Trunk Assessment Cervical / Trunk Assessment: Kyphotic  Communication   Communication: HOH  Cognition Arousal/Alertness: Awake/alert Behavior During Therapy: WFL for tasks assessed/performed Overall Cognitive Status: Within Functional Limits for tasks assessed  General Comments General comments (skin integrity, edema, etc.): Pt SpO2 >87% and presenting with SOB during activity on RA; placing on 2L O2 to recover once in recliner    Exercises     Assessment/Plan    PT Assessment Patient needs continued PT services  PT Problem List Decreased strength;Decreased mobility;Decreased safety  awareness;Decreased activity tolerance;Decreased balance;Decreased knowledge of use of DME       PT Treatment Interventions DME instruction;Therapeutic activities;Gait training;Functional mobility training;Therapeutic exercise;Patient/family education    PT Goals (Current goals can be found in the Care Plan section)  Acute Rehab PT Goals Patient Stated Goal: "Feel better and go home" PT Goal Formulation: With patient Time For Goal Achievement: 09/16/18 Potential to Achieve Goals: Good    Frequency Min 3X/week   Barriers to discharge        Co-evaluation               AM-PAC PT "6 Clicks" Mobility  Outcome Measure Help needed turning from your back to your side while in a flat bed without using bedrails?: A Little Help needed moving from lying on your back to sitting on the side of a flat bed without using bedrails?: A Little Help needed moving to and from a bed to a chair (including a wheelchair)?: A Little Help needed standing up from a chair using your arms (e.g., wheelchair or bedside chair)?: A Little Help needed to walk in hospital room?: A Lot Help needed climbing 3-5 steps with a railing? : A Lot 6 Click Score: 16    End of Session   Activity Tolerance: Patient tolerated treatment well Patient left: in chair;with call bell/phone within reach Nurse Communication: Mobility status PT Visit Diagnosis: Muscle weakness (generalized) (M62.81);Difficulty in walking, not elsewhere classified (R26.2)    Time: 9038-3338 PT Time Calculation (min) (ACUTE ONLY): 18 min   Charges:   PT Evaluation $PT Eval Low Complexity: [facility] Acute Rehabilitation Services Pager [phone] Office [phone]     Claretha Cooper 09/02/2018, 2:28 PM

## 2018-09-02 NOTE — Plan of Care (Signed)
  Problem: Education: Goal: Knowledge of risk factors and measures for prevention of condition will improve Outcome: Progressing   Problem: Coping: Goal: Psychosocial and spiritual needs will be supported Outcome: Progressing   Problem: Respiratory: Goal: Complications related to the disease process, condition or treatment will be avoided or minimized Outcome: Progressing   

## 2018-09-02 NOTE — Telephone Encounter (Signed)
Spoke Dr.Krishnan- pt's positive for COVD-19 currently admitted to hospital.   Spoke to pt's daughter- [name] Armandina Stammer tomorrow. Pt's daughter- in-law [pt lives son/dil] being tested for covid today. Asked  Tammala Weider, daughter-in-law call radiation dept [getting radiation for breast cancer] to inform of her status.

## 2018-09-02 NOTE — Telephone Encounter (Signed)
Rec'd a call from patient's daughter Theresa Lynch stating that she had to take patient to ED on 08/31/18 and she has tested positive for COVID19 and has now been admitted at [facility] consult has been cancelled awaiting hospital discharge to reschedule.

## 2018-09-02 NOTE — Evaluation (Signed)
Occupational Therapy Evaluation Patient Details Name: Theresa Lynch MRN: [id number] DOB: 12/19/1929 Today's Date: 09/02/2018    History of Present Illness 85 yo female presenting to the ED with nausea and vomiting; Emesis is dark / coffee ground in color. COVID positive. PMH including arthritis, depression, left hip fx with IM nail placedment (2019), HTN, and MM.    Clinical Impression   PTA, pt was living with her son and daughter-in-law and required assistance for ADLs and used RW for functional mobility. Pt currently requiring Min A for UB ADLs, Mod-Max A for LB ADLs, and Min A for functional mobility with RW. Pt presenting with decreased strength, balance, and activity tolerance. Pt with SOB and SpO2 dropping to 87% on RA during mobility; placed on 2L O2 for recovery while in recliner. Pt would benefit from further acute OT to facilitate safe dc. Recommend dc to home with HHOT for further OT to optimize safety, independence with ADLs, and return to PLOF.      Follow Up Recommendations  Home health OT;Supervision/Assistance - 24 hour    Equipment Recommendations  3 in 1 bedside commode    Recommendations for Other Services PT consult     Precautions / Restrictions Precautions Precautions: Fall Restrictions Weight Bearing Restrictions: No      Mobility Bed Mobility                  Transfers Overall transfer level: Needs assistance Equipment used: Rolling walker (2 wheeled) Transfers: Sit to/from Stand Sit to Stand: Min guard         General transfer comment: Min Guard A for safety during power up into standing    Balance Overall balance assessment: Needs assistance Sitting-balance support: No upper extremity supported;Feet supported Sitting balance-Leahy Scale: Fair     Standing balance support: Bilateral upper extremity supported;During functional activity Standing balance-Leahy Scale: Poor Standing balance comment: Reliant on UE support                           ADL either performed or assessed with clinical judgement   ADL Overall ADL's : Needs assistance/impaired Eating/Feeding: Independent;Sitting   Grooming: Wash/dry face;Set up;Sitting   Upper Body Bathing: Minimal assistance;Sitting   Lower Body Bathing: Moderate assistance;Sit to/from stand   Upper Body Dressing : Minimal assistance;Sitting   Lower Body Dressing: Maximal assistance;Sit to/from stand   Toilet Transfer: Minimal assistance;+2 for safety/equipment;Ambulation;RW(simulated to recliner) Armed forces technical officer Details (indicate cue type and reason): Min A for stability in standing         Functional mobility during ADLs: Minimal assistance;Rolling walker;+2 for safety/equipment General ADL Comments: Pt demonstrating increased strength, balance and activity tolerance     Vision Baseline Vision/History: Glaucoma;Cataracts(Cataract extration right (2017)) Patient Visual Report: No change from baseline       Perception     Praxis      Pertinent Vitals/Pain Pain Assessment: Faces Faces Pain Scale: No hurt Pain Intervention(s): Monitored during session;Limited activity within patient's tolerance;Repositioned     Hand Dominance Right   Extremity/Trunk Assessment Upper Extremity Assessment Upper Extremity Assessment: Generalized weakness   Lower Extremity Assessment Lower Extremity Assessment: Defer to PT evaluation   Cervical / Trunk Assessment Cervical / Trunk Assessment: Kyphotic   Communication Communication Communication: HOH   Cognition Arousal/Alertness: Awake/alert Behavior During Therapy: WFL for tasks assessed/performed Overall Cognitive Status: Within Functional Limits for tasks assessed  General Comments  Pt SpO2 >87% and presenting with SOB during activity on RA; placing on 2L O2 to recover once in recliner    Exercises     Shoulder Instructions      Home Living  Family/patient expects to be discharged to:: Private residence Living Arrangements: Children Available Help at Discharge: Family(Son and daughter in law) Type of Home: House Home Access: Stairs to enter     Home Layout: One level     Bathroom Shower/Tub: Teacher, early years/pre: Handicapped height     Home Equipment: Environmental consultant - 2 wheels          Prior Functioning/Environment Level of Independence: Needs assistance  Gait / Transfers Assistance Needed: Uses RW ADL's / Homemaking Assistance Needed: Son and daughter in law perform IADLs and assist with bathing and dressing            OT Problem List: Decreased strength;Decreased range of motion;Decreased activity tolerance;Impaired balance (sitting and/or standing);Decreased knowledge of use of DME or AE;Decreased knowledge of precautions;Cardiopulmonary status limiting activity      OT Treatment/Interventions: Self-care/ADL training;Therapeutic exercise;Energy conservation;DME and/or AE instruction;Therapeutic activities;Patient/family education    OT Goals(Current goals can be found in the care plan section) Acute Rehab OT Goals Patient Stated Goal: "Feel better and go home" OT Goal Formulation: With patient Time For Goal Achievement: 09/16/18 Potential to Achieve Goals: Good  OT Frequency: Min 2X/week   Barriers to D/C:            Co-evaluation              AM-PAC OT "6 Clicks" Daily Activity     Outcome Measure Help from another person eating meals?: None Help from another person taking care of personal grooming?: A Little Help from another person toileting, which includes using toliet, bedpan, or urinal?: A Little Help from another person bathing (including washing, rinsing, drying)?: A Lot Help from another person to put on and taking off regular upper body clothing?: A Little Help from another person to put on and taking off regular lower body clothing?: A Lot 6 Click Score: 17   End of  Session Equipment Utilized During Treatment: Gait belt;Rolling walker;Oxygen(2L) Nurse Communication: Mobility status  Activity Tolerance: Patient tolerated treatment well;Patient limited by fatigue Patient left: in chair;with call bell/phone within reach  OT Visit Diagnosis: Unsteadiness on feet (R26.81);Other abnormalities of gait and mobility (R26.89);Muscle weakness (generalized) (M62.81)                Time: 4497-5300 OT Time Calculation (min): 21 min Charges:  OT General Charges $OT Visit: 1 Visit OT Evaluation $OT Eval Moderate Complexity: [place], OTR/L Acute Rehab Pager: [phone] Office: [place] 09/02/2018, 12:51 PM

## 2018-09-03 DIAGNOSIS — D62 Acute posthemorrhagic anemia: Secondary | ICD-10-CM

## 2018-09-03 LAB — CBC WITH DIFFERENTIAL/PLATELET
Abs Immature Granulocytes: 0.03 10*3/uL (ref 0.00–0.07)
Basophils Absolute: 0 10*3/uL (ref 0.0–0.1)
Basophils Relative: 0 %
Eosinophils Absolute: 0 10*3/uL (ref 0.0–0.5)
Eosinophils Relative: 0 %
HCT: 28.5 % — ABNORMAL LOW (ref 36.0–46.0)
Hemoglobin: 8.3 g/dL — ABNORMAL LOW (ref 12.0–15.0)
Immature Granulocytes: 1 %
Lymphocytes Relative: 11 %
Lymphs Abs: 0.5 10*3/uL — ABNORMAL LOW (ref 0.7–4.0)
MCH: 24.9 pg — ABNORMAL LOW (ref 26.0–34.0)
MCHC: 29.1 g/dL — ABNORMAL LOW (ref 30.0–36.0)
MCV: 85.6 fL (ref 80.0–100.0)
Monocytes Absolute: 0.3 10*3/uL (ref 0.1–1.0)
Monocytes Relative: 5 %
Neutro Abs: 4.2 10*3/uL (ref 1.7–7.7)
Neutrophils Relative %: 83 %
Platelets: 476 10*3/uL — ABNORMAL HIGH (ref 150–400)
RBC: 3.33 MIL/uL — ABNORMAL LOW (ref 3.87–5.11)
RDW: 18.6 % — ABNORMAL HIGH (ref 11.5–15.5)
WBC: 5 10*3/uL (ref 4.0–10.5)
nRBC: 0 % (ref 0.0–0.2)

## 2018-09-03 LAB — COMPREHENSIVE METABOLIC PANEL
ALT: 11 U/L (ref 0–44)
AST: 14 U/L — ABNORMAL LOW (ref 15–41)
Albumin: 2.4 g/dL — ABNORMAL LOW (ref 3.5–5.0)
Alkaline Phosphatase: 49 U/L (ref 38–126)
Anion gap: 11 (ref 5–15)
BUN: 27 mg/dL — ABNORMAL HIGH (ref 8–23)
CO2: 20 mmol/L — ABNORMAL LOW (ref 22–32)
Calcium: 7.2 mg/dL — ABNORMAL LOW (ref 8.9–10.3)
Chloride: 104 mmol/L (ref 98–111)
Creatinine, Ser: 1.07 mg/dL — ABNORMAL HIGH (ref 0.44–1.00)
GFR calc Af Amer: 54 mL/min — ABNORMAL LOW (ref >60)
GFR calc non Af Amer: 47 mL/min — ABNORMAL LOW (ref >60)
Glucose, Bld: 140 mg/dL — ABNORMAL HIGH (ref 70–99)
Potassium: 4.6 mmol/L (ref 3.5–5.1)
Sodium: 135 mmol/L (ref 135–145)
Total Bilirubin: 0.7 mg/dL (ref 0.3–1.2)
Total Protein: 6.2 g/dL — ABNORMAL LOW (ref 6.5–8.1)

## 2018-09-03 LAB — C-REACTIVE PROTEIN: CRP: 5.8 mg/dL — ABNORMAL HIGH (ref <1.0)

## 2018-09-03 LAB — D-DIMER, QUANTITATIVE: D-Dimer, Quant: 2.89 ug/mL-FEU — ABNORMAL HIGH (ref 0.00–0.50)

## 2018-09-03 LAB — MAGNESIUM: Magnesium: 2 mg/dL (ref 1.7–2.4)

## 2018-09-03 NOTE — Progress Notes (Signed)
Otho Bellows, pt's daughter for daily update. POC reviewed. Denies questions.

## 2018-09-03 NOTE — Consult Note (Signed)
   [facility] CM Inpatient Consult   09/03/2018  Theresa Lynch Feb 13, 1929 [id number]    Patient checked for28%high risk scorefor unplanned readmission and hospitalization. Patient was previously engaged by [facility] care management coordinator for transition of care in the past.  Chart reviewed and MD's brief history/summary on 09/03/18 states asfollows : 85 y.o.femalewith medical history significant ofMM,  presented to the ED with c/o N/V for the past 1 day.Emesis was dark / coffee ground in color and concerning for hematemesis. She has also had  diarrhea for the past 1 week. Also had productive cough for the past 10 days or more for which her oncologist stopped her revlimid on 7/31.  She was found to be positive for COVID-19 and was also noted to have a lower hemoglobin than her baseline.  She went into atrial fibrillation with mild RVR while she was in the ED.  She was hospitalized for further management.  (Atrial fibrillation with RVR.  COVID-19 with acute respiratory failure with hypoxia) Patient's son and daughter-in-law live with her. Daughter-in-law recently diagnosed with malignancy. So, disposition still remains unclear as of now.  Her primary care provider Selmer Dominion, MD, with [facility], listed as providing transition of care follow-up.  Patient will be followed by an external care management group([facility]) post hospitalization to follow-up needs and for continued case management services under her [place].    For additional questions,please contact:  Dea Bitting A. Meika Earll, BSN, RN-BC [facility] Liaison Cell: [phone]

## 2018-09-03 NOTE — Progress Notes (Signed)
Requested pt to get up to chair for breakfast. Pt stated she was not hungry and did not want to get up. Pt educated that in order to get better she needed to get up to the chair for the day. Pt stated to this RN that she was supposed to go home. Pt educated that if she was going home she needed to get up to the chair or at least sit on the side of the bed. Pt assisted to sit on the side of the bed. Pt was able to sit upright to take her pills but after a few minutes sitting on the side of the bed, pt slumped backwards. Pt again assisted to sit up and pt again fell backwards across the bed. Pt asked why she was not holding herself upright. Pt responded that she was tired and wanted to go to sleep. Pt again educated on the need to sit up in the chair. Attempted to get pt up to the chair and pt refused. Pt returned herself to the bed, lying on her back. Pt educated she needed to lay on her side or her belly to maximize her breathing. Pt verbalizes understanding and continues to lie on her back.

## 2018-09-03 NOTE — Progress Notes (Signed)
PROGRESS NOTE  Theresa Lynch NOI:[id number] DOB: [birth year] DOA: 09/01/2018  PCP: [name], MD  Brief History/Interval Summary: 85 y.o. female with medical history significant of MM.  Patient presents to the ED with c/o N/V for the past 1 day.  Emesis was dark / coffee ground in color and concerning for hematemesis. She has also had  diarrhea for the past 1 week.  Also had productive cough for the past 10 days or more for which her oncologist stopped her revlimid on 7/31.  She was found to be positive for COVID-19.  She was also noted to have a lower hemoglobin than her baseline.  She went into atrial fibrillation with mild RVR while she was in the ED.  She was hospitalized for further management.    Reason for Visit: Atrial fibrillation with RVR.  COVID-19 with acute respiratory failure with hypoxia.  Consultants: Phone discussion with gastroenterology by admitting provider  Procedures: None  Antibiotics: Anti-infectives (From admission, onward)   Start     Dose/Rate Route Frequency Ordered Stop   09/02/18 1400  remdesivir 100 mg in sodium chloride 0.9 % 250 mL IVPB     100 mg 500 mL/hr over 30 Minutes Intravenous Every 24 hours 09/01/18 1232 09/06/18 1359   09/01/18 1400  remdesivir 200 mg in sodium chloride 0.9 % 250 mL IVPB     200 mg 500 mL/hr over 30 Minutes Intravenous Once 09/01/18 1232 09/01/18 1900       Subjective/Interval History: Patient more awake alert.  Denies any complaints this morning.  Occasional cough.  No abdominal pain.  No diarrhea or nausea.      Assessment/Plan:  Acute Hypoxic Resp. Failure due to Acute Covid 19 Viral Illness  COVID-19 Labs  Recent Labs    09/01/18 0835 09/02/18 0220 09/03/18 0242  DDIMER 1.35* 1.50* 2.89*  CRP 6.2* 6.4* 5.8*    Lab Results  Component Value Date   SARSCOV2NAA POSITIVE (A) 08/31/2018     Fever: Remains afebrile Oxygen requirements: Noted to be on room air this morning.  Saturating in the  early 90s.   Antibacterials: None Remdesivir: Day 3 today Steroids: Dexamethasone 6 mg daily Diuretics: None Actemra: No indication as yet DVT Prophylaxis: SCDs alone  Patient remains stable from a respiratory standpoint.  She is noted to be on room air this morning saturating in the early 90s.  Continue Remdesivir and steroids.  D-dimer noted to be 2.89 today.  Patient not getting Lovenox due to suspected GI bleed.  However if her d-dimer continues to rise we may have to consider placing her on heparin or Lovenox as long as her hemoglobin remains stable.  Continue incentive spirometry.  Mobilization.  Awake prone positioning as much as possible.  CRP noted to be slightly better today.    Suspected upper GI bleed Patient apparently had coffee-ground emesis.  Hemoglobin was noted to be lower than her baseline.  Has not had any episodes of GI bleed in the hospital.  Hemoglobin is low but stable for the most part.  Continue with PPI.  CT scan of the abdomen and pelvis did not show any acute findings.  Continue to hold on antiplatelet agents.  May have to initiate heparin or Lovenox if her d-dimer continues to rise.    Atrial fibrillation with mild RVR This was noted in the emergency department.  Patient started on beta-blocker.  Dose was adjusted yesterday.  Heart rate seems to be better controlled.  TSH 0.753.  Free  T4 noted to be mildly elevated at 1.56.  Significance of this is not entirely clear in the setting of acute illness.  We will recommend rechecking thyroid function test in a few weeks time.  Not a candidate for full dose anticoagulation due to suspected GI bleed as well as her other comorbidities and poor long-term prognosis as a result of her comorbidities.  Daughter agrees with this.  We will also hold off on echocardiogram as it will not change management.  Acute blood loss anemia Baseline hemoglobin is between 10 and 11.  Noted to be 8.3 at admission.  Hemoglobin has been stable the  last few days.  Continue to monitor.  History of multiple myeloma Patient was on Revlimid recently.  Discontinued on July 31.  According to notes in EMR patient has been transition to palliative care.  She has stopped active medical treatment for her cancer.  Discussed with her oncologist, Dr. Rogue Bussing, yesterday.  Apparently her multiple myeloma is mostly in the background.  Will not affect prognosis in this acute setting.  Hypokalemia Potassium level has improved.  Magnesium 2.0.    Essential hypertension Blood pressure reasonably well controlled.  Amlodipine on hold as the patient was started on metoprolol.   DVT Prophylaxis: SCDs PUD Prophylaxis: Protonix Code Status: DNR Family Communication: Daughter being updated on a daily basis Disposition Plan: PT and OT evaluation.  Patient's son and daughter-in-law live with her.  Daughter-in-law recently diagnosed with malignancy.  So disposition remains unclear as of now.   Medications:  Scheduled:  atorvastatin  5 mg Oral Daily   calcium carbonate  1 tablet Oral BID WC   cholecalciferol  2,000 Units Oral BID   dexamethasone (DECADRON) injection  6 mg Intravenous Q24H   levothyroxine  50 mcg Oral Daily   metoprolol tartrate  25 mg Oral BID   pantoprazole  40 mg Oral BID   PARoxetine  40 mg Oral QHS   traZODone  25 mg Oral QHS   Continuous:  remdesivir 100 mg in NS 250 mL Stopped (09/02/18 1900)   BOF:BPZWCHENIDPOE, guaiFENesin-dextromethorphan, ondansetron **OR** ondansetron (ZOFRAN) IV, traMADol   Objective:  Vital Signs  Vitals:   09/02/18 2033 09/03/18 0415 09/03/18 0631 09/03/18 0850  BP: 111/83 119/87  115/75  Pulse: 86 85 74 88  Resp: 20 18 (!) 21 18  Temp: 97.8 F (36.6 C) 97.8 F (36.6 C)  (!) 97.4 F (36.3 C)  TempSrc: Oral Oral  Oral  SpO2: 99% 98% 95% (!) 89%    Intake/Output Summary (Last 24 hours) at 09/03/2018 1006 Last data filed at 09/02/2018 1927 Gross per 24 hour  Intake 721.57 ml    Output 475 ml  Net 246.57 ml   There were no vitals filed for this visit.  General appearance: Awake alert.  In no distress Resp: Coarse breath sounds bilaterally with few crackles at the bases.  No wheezing or rhonchi.   Cardio: S1-S2 is irregularly irregular.  No S3-S4. GI: Abdomen is soft.  Nontender nondistended.  Bowel sounds are present normal.  No masses organomegaly Extremities: No edema.  Full range of motion of lower extremities. Neurologic: Noted to be distracted.  Hard of hearing.  No focal neurological deficits.      Lab Results:  Data Reviewed: I have personally reviewed following labs and imaging studies  CBC: Recent Labs  Lab 08/31/18 1256 08/31/18 2051 09/01/18 0835 09/02/18 0220 09/03/18 0242  WBC 3.5*  --  3.7* 3.9* 5.0  NEUTROABS  --   --  2.8 2.7 4.2  HGB 8.3* 8.4* 8.3* 8.0* 8.3*  HCT 27.3* 27.3* 27.5* 27.5* 28.5*  MCV 82.5  --  85.1 86.5 85.6  PLT 400  --  432* 469* 476*    Basic Metabolic Panel: Recent Labs  Lab 08/31/18 1256 08/31/18 2051 09/01/18 0835 09/02/18 0220 09/03/18 0242  NA 133*  --  138 138 135  K 2.8*  --  3.4* 3.7 4.6  CL 102  --  106 107 104  CO2 18*  --  18* 20* 20*  GLUCOSE 110*  --  83 81 140*  BUN 21  --  15 18 27*  CREATININE 1.27*  --  1.14* 1.14* 1.07*  CALCIUM 7.2*  --  7.3* 7.3* 7.2*  MG  --  1.9  --   --  2.0    GFR: Estimated Creatinine Clearance: 34.7 mL/min (A) (by C-G formula based on SCr of 1.07 mg/dL (H)).  Liver Function Tests: Recent Labs  Lab 08/31/18 1256 09/01/18 0835 09/02/18 0220 09/03/18 0242  AST '28 21 19 ' 14*  ALT '10 10 11 11  ' ALKPHOS 46 51 46 49  BILITOT 0.8 0.7 0.7 0.7  PROT 5.9* 6.1* 5.9* 6.2*  ALBUMIN 2.3* 2.4* 2.5* 2.4*    Recent Labs  Lab 08/31/18 1256  LIPASE 42    Coagulation Profile: Recent Labs  Lab 08/31/18 1542  INR 1.3*      Recent Results (from the past 240 hour(s))  SARS Coronavirus 2 [facility] order, Performed in [facility] hospital lab)  Nasopharyngeal Nasopharyngeal Swab     Status: Abnormal   Collection Time: 08/31/18  3:40 PM   Specimen: Nasopharyngeal Swab  Result Value Ref Range Status   SARS Coronavirus 2 POSITIVE (A) NEGATIVE Final    Comment: RESULT CALLED TO, READ BACK BY AND VERIFIED WITH: STEPHEN JONES '@1827'  ON 08/31/2018 BY FMW (NOTE) If result is NEGATIVE SARS-CoV-2 target nucleic acids are NOT DETECTED. The SARS-CoV-2 RNA is generally detectable in upper and lower  respiratory specimens during the acute phase of infection. The lowest  concentration of SARS-CoV-2 viral copies this assay can detect is 250  copies / mL. A negative result does not preclude SARS-CoV-2 infection  and should not be used as the sole basis for treatment or other  patient management decisions.  A negative result may occur with  improper specimen collection / handling, submission of specimen other  than nasopharyngeal swab, presence of viral mutation(s) within the  areas targeted by this assay, and inadequate number of viral copies  (<250 copies / mL). A negative result must be combined with clinical  observations, patient history, and epidemiological information. If result is POSITIVE SARS-CoV-2 target nucleic acids are DETECTE D. The SARS-CoV-2 RNA is generally detectable in upper and lower  respiratory specimens during the acute phase of infection.  Positive  results are indicative of active infection with SARS-CoV-2.  Clinical  correlation with patient history and other diagnostic information is  necessary to determine patient infection status.  Positive results do  not rule out bacterial infection or co-infection with other viruses. If result is PRESUMPTIVE POSTIVE SARS-CoV-2 nucleic acids MAY BE PRESENT.   A presumptive positive result was obtained on the submitted specimen  and confirmed on repeat testing.  While 2019 novel coronavirus  (SARS-CoV-2) nucleic acids may be present in the submitted sample  additional  confirmatory testing may be necessary for epidemiological  and / or clinical management purposes  to differentiate between  SARS-CoV-2 and other Sarbecovirus  currently known to infect humans.  If clinically indicated additional testing with an alternate test  methodology (LAB745 3) is advised. The SARS-CoV-2 RNA is generally  detectable in upper and lower respiratory specimens during the acute  phase of infection. The expected result is Negative. Fact Sheet for Patients:  StrictlyIdeas.no Fact Sheet for Healthcare Providers: BankingDealers.co.za This test is not yet approved or cleared by the [place] FDA and has been authorized for detection and/or diagnosis of SARS-CoV-2 by FDA under an Emergency Use Authorization (EUA).  This EUA will remain in effect (meaning this test can be used) for the duration of the COVID-19 declaration under Section 564(b)(1) of the Act, 21 U.S.C. section 360bbb-3(b)(1), unless the authorization is terminated or revoked sooner. Performed at [facility], [address] [place], [place] [address]   Urine culture     Status: None   Collection Time: 08/31/18  4:30 PM   Specimen: Urine, Clean Catch  Result Value Ref Range Status   Specimen Description   Final    URINE, CLEAN CATCH Performed at [facility], [address] [place], [place] [address]    Special Requests   Final    NONE Performed at [facility], [address], [place], [place] [address]    Culture   Final    Multiple bacterial morphotypes present, none predominant. Suggest appropriate recollection if clinically indicated.   Report Status 09/02/2018 FINAL  Final      Radiology Studies: No results found.     LOS: 2 days   [facility] Pager on [email or web address]  09/03/2018, 10:06 AM

## 2018-09-04 LAB — CBC WITH DIFFERENTIAL/PLATELET
Abs Immature Granulocytes: 0.06 10*3/uL (ref 0.00–0.07)
Basophils Absolute: 0 10*3/uL (ref 0.0–0.1)
Basophils Relative: 0 %
Eosinophils Absolute: 0 10*3/uL (ref 0.0–0.5)
Eosinophils Relative: 0 %
HCT: 27.9 % — ABNORMAL LOW (ref 36.0–46.0)
Hemoglobin: 8.2 g/dL — ABNORMAL LOW (ref 12.0–15.0)
Immature Granulocytes: 1 %
Lymphocytes Relative: 6 %
Lymphs Abs: 0.5 10*3/uL — ABNORMAL LOW (ref 0.7–4.0)
MCH: 25 pg — ABNORMAL LOW (ref 26.0–34.0)
MCHC: 29.4 g/dL — ABNORMAL LOW (ref 30.0–36.0)
MCV: 85.1 fL (ref 80.0–100.0)
Monocytes Absolute: 0.4 10*3/uL (ref 0.1–1.0)
Monocytes Relative: 4 %
Neutro Abs: 8.1 10*3/uL — ABNORMAL HIGH (ref 1.7–7.7)
Neutrophils Relative %: 89 %
Platelets: 453 10*3/uL — ABNORMAL HIGH (ref 150–400)
RBC: 3.28 MIL/uL — ABNORMAL LOW (ref 3.87–5.11)
RDW: 18.7 % — ABNORMAL HIGH (ref 11.5–15.5)
WBC: 9.1 10*3/uL (ref 4.0–10.5)
nRBC: 0.2 % (ref 0.0–0.2)

## 2018-09-04 LAB — COMPREHENSIVE METABOLIC PANEL
ALT: 13 U/L (ref 0–44)
AST: 19 U/L (ref 15–41)
Albumin: 2.3 g/dL — ABNORMAL LOW (ref 3.5–5.0)
Alkaline Phosphatase: 52 U/L (ref 38–126)
Anion gap: 10 (ref 5–15)
BUN: 50 mg/dL — ABNORMAL HIGH (ref 8–23)
CO2: 18 mmol/L — ABNORMAL LOW (ref 22–32)
Calcium: 7.5 mg/dL — ABNORMAL LOW (ref 8.9–10.3)
Chloride: 109 mmol/L (ref 98–111)
Creatinine, Ser: 1.49 mg/dL — ABNORMAL HIGH (ref 0.44–1.00)
GFR calc Af Amer: 36 mL/min — ABNORMAL LOW (ref >60)
GFR calc non Af Amer: 31 mL/min — ABNORMAL LOW (ref >60)
Glucose, Bld: 140 mg/dL — ABNORMAL HIGH (ref 70–99)
Potassium: 4.9 mmol/L (ref 3.5–5.1)
Sodium: 137 mmol/L (ref 135–145)
Total Bilirubin: 0.5 mg/dL (ref 0.3–1.2)
Total Protein: 5.8 g/dL — ABNORMAL LOW (ref 6.5–8.1)

## 2018-09-04 LAB — C-REACTIVE PROTEIN: CRP: 2.7 mg/dL — ABNORMAL HIGH (ref <1.0)

## 2018-09-04 LAB — D-DIMER, QUANTITATIVE: D-Dimer, Quant: 2.92 ug/mL-FEU — ABNORMAL HIGH (ref 0.00–0.50)

## 2018-09-04 MED ORDER — HEPARIN SODIUM (PORCINE) 5000 UNIT/ML IJ SOLN
5000.0000 [IU] | Freq: Two times a day (BID) | INTRAMUSCULAR | Status: DC
  Administered 2018-09-04 – 2018-09-05 (×4): 5000 [IU] via SUBCUTANEOUS
  Filled 2018-09-04 (×3): qty 1

## 2018-09-04 NOTE — Progress Notes (Signed)
Physical Therapy Treatment Patient Details Name: Theresa Lynch MRN: [id number] DOB: 1929/06/17 Today's Date: 09/04/2018    History of Present Illness 85 yo female presenting to the ED with nausea and vomiting; Emesis is dark / coffee ground in color. COVID positive. PMH including arthritis, depression, left hip fx with IM nail placement (2019), HTN, and MM.    PT Comments    Patient requires encouragement  To mobilize. patiet declined to ambulate inroom. Had een in recliner  X 6 hours. Continue PT.   Follow Up Recommendations  Supervision/Assistance - 24 hour(pt, declines HHPT)     Equipment Recommendations  None recommended by PT    Recommendations for Other Services       Precautions / Restrictions      Mobility  Bed Mobility   Bed Mobility: Sit to Supine       Sit to supine: Min guard   General bed mobility comments: self assisted legs onto bed  Transfers Overall transfer level: Needs assistance Equipment used: Rolling walker (2 wheeled) Transfers: Sit to/from [facility] Sit to Stand: Min assist Stand pivot transfers: Min assist       General transfer comment: Min A for safety and balance  Ambulation/Gait             General Gait Details: pt. declined to ambulate   Stairs             Wheelchair Mobility    Modified Rankin (Stroke Patients Only)       Balance                                            Cognition   Behavior During Therapy: WFL for tasks assessed/performed;Flat affect Overall Cognitive Status: Within Functional Limits for tasks assessed                                        Exercises      General Comments        Pertinent Vitals/Pain Faces Pain Scale: No hurt    Home Living                      Prior Function            PT Goals (current goals can now be found in the care plan section) Progress towards PT goals: Progressing toward  goals    Frequency    Min 3X/week      PT Plan Current plan remains appropriate    Co-evaluation              AM-PAC PT "6 Clicks" Mobility   Outcome Measure  Help needed turning from your back to your side while in a flat bed without using bedrails?: A Little Help needed moving from lying on your back to sitting on the side of a flat bed without using bedrails?: A Little Help needed moving to and from a bed to a chair (including a wheelchair)?: A Little Help needed standing up from a chair using your arms (e.g., wheelchair or bedside chair)?: A Little Help needed to walk in hospital room?: A Lot Help needed climbing 3-5 steps with a railing? : A Lot 6 Click Score: 16    End of Session   Activity  Tolerance: Patient tolerated treatment well Patient left: in bed;with call bell/phone within reach Nurse Communication: Mobility status PT Visit Diagnosis: Muscle weakness (generalized) (M62.81);Difficulty in walking, not elsewhere classified (R26.2)     Time: 6047-9987 PT Time Calculation (min) (ACUTE ONLY): 12 min  Charges:  $Therapeutic Activity: 8-22 mins                     Tresa Endo PT Acute Rehabilitation Services  Office [phone]    Claretha Cooper 09/04/2018, 4:59 PM

## 2018-09-04 NOTE — Progress Notes (Signed)
Occupational Therapy Treatment Patient Details Name: Theresa Lynch MRN: [id number] DOB: June 29, 1929 Today's Date: 09/04/2018    History of present illness 85 yo female presenting to the ED with nausea and vomiting; Emesis is dark / coffee ground in color. COVID positive. PMH including arthritis, depression, left hip fx with IM nail placedment (2019), HTN, and MM.    OT comments  Pt requiring increased encouragement for participate in therapy. Pt performing toilet hygiene after urine incontinence in the bed with Mod A for peri care and Min A for balance. Pt with fatigue and decreased strength. Performing stand-pivot to recliner with Min A. While seated in recliner, pt performing hand hygiene and oral care with set up. Breakfast arriving at end of session and encouraged pt to eat to increase strength. Continue to recommend dc home with HHOT and will continue to follow acutely as admitted.    Follow Up Recommendations  Home health OT;Supervision/Assistance - 24 hour    Equipment Recommendations  3 in 1 bedside commode    Recommendations for Other Services PT consult    Precautions / Restrictions Precautions Precautions: Fall Restrictions Weight Bearing Restrictions: No       Mobility Bed Mobility Overal bed mobility: Needs Assistance Bed Mobility: Supine to Sit     Supine to sit: Min assist     General bed mobility comments: Min A for elevating trunk  Transfers Overall transfer level: Needs assistance Equipment used: Rolling walker (2 wheeled) Transfers: Sit to/from [facility] Sit to Stand: Min assist Stand pivot transfers: Min assist       General transfer comment: Min A for safety and balance    Balance Overall balance assessment: Needs assistance Sitting-balance support: No upper extremity supported;Feet supported Sitting balance-Leahy Scale: Fair     Standing balance support: Bilateral upper extremity supported;During functional  activity;Single extremity supported Standing balance-Leahy Scale: Poor Standing balance comment: Reliant on UE support                           ADL either performed or assessed with clinical judgement   ADL Overall ADL's : Needs assistance/impaired     Grooming: Oral care;Sitting;Set up                   Toilet Transfer: Minimal assistance;Stand-pivot;RW(Simulated to recliner) Toilet Transfer Details (indicate cue type and reason): Min A for balance and safety during pivot to recliner Toileting- Clothing Manipulation and Hygiene: Moderate assistance;Sit to/from stand Toileting - Clothing Manipulation Details (indicate cue type and reason): Pt with urinary incontience in bed. Requiring Mod A for peri care. Pt able to clean front peri area with Min A for balance and then required assistance for posterior peri area.      Functional mobility during ADLs: Minimal assistance;Rolling walker(stand pivot) General ADL Comments: Pt requiring increased encouragement to participate in therapy. Performing stand pivot transfer to recliner and requiring assistance to clean up after incontience in bed     Vision       Perception     Praxis      Cognition Arousal/Alertness: Awake/alert Behavior During Therapy: [facility] for tasks assessed/performed Overall Cognitive Status: Within Functional Limits for tasks assessed                                          Exercises     Shoulder  Instructions       General Comments VSS on RA    Pertinent Vitals/ Pain       Pain Assessment: Faces Faces Pain Scale: No hurt Pain Intervention(s): Monitored during session  Home Living                                          Prior Functioning/Environment              Frequency  Min 2X/week        Progress Toward Goals  OT Goals(current goals can now be found in the care plan section)  Progress towards OT goals: Progressing toward  goals  Acute Rehab OT Goals Patient Stated Goal: "Feel better and go home" OT Goal Formulation: With patient Time For Goal Achievement: 09/16/18 Potential to Achieve Goals: Good ADL Goals Pt Will Perform Grooming: with set-up;with supervision;standing Pt Will Perform Upper Body Dressing: with set-up;with supervision;sitting Pt Will Perform Lower Body Dressing: with min guard assist;sit to/from stand Pt Will Transfer to Toilet: with min guard assist;bedside commode;ambulating Additional ADL Goal #1: Pt will verablize three energy conservation techniques for ADLs with Min cues  Plan Discharge plan remains appropriate    Co-evaluation                 AM-PAC OT "6 Clicks" Daily Activity     Outcome Measure   Help from another person eating meals?: None Help from another person taking care of personal grooming?: A Little Help from another person toileting, which includes using toliet, bedpan, or urinal?: A Little Help from another person bathing (including washing, rinsing, drying)?: A Lot Help from another person to put on and taking off regular upper body clothing?: A Little Help from another person to put on and taking off regular lower body clothing?: A Lot 6 Click Score: 17    End of Session Equipment Utilized During Treatment: Gait belt;Rolling walker  OT Visit Diagnosis: Unsteadiness on feet (R26.81);Other abnormalities of gait and mobility (R26.89);Muscle weakness (generalized) (M62.81)   Activity Tolerance Patient tolerated treatment well;Patient limited by fatigue   Patient Left in chair;with call bell/phone within reach   Nurse Communication Mobility status        Time: 6979-4801 OT Time Calculation (min): 21 min  Charges: OT General Charges $OT Visit: 1 Visit OT Treatments $Self Care/Home Management : 8-22 mins  Bulger, OTR/L Acute Rehab Pager: [phone] Office: [place] 09/04/2018, 10:24 AM

## 2018-09-04 NOTE — Progress Notes (Signed)
PROGRESS NOTE  Theresa Lynch CHE:[id number] DOB: 06-17-1929 DOA: 09/01/2018  PCP: [name], MD  Brief History/Interval Summary: 85 y.o. female with medical history significant of MM.  Patient presents to the ED with c/o N/V for the past 1 day.  Emesis was dark / coffee ground in color and concerning for hematemesis. She has also had  diarrhea for the past 1 week.  Also had productive cough for the past 10 days or more for which her oncologist stopped her revlimid on 7/31.  She was found to be positive for COVID-19.  She was also noted to have a lower hemoglobin than her baseline.  She went into atrial fibrillation with mild RVR while she was in the ED.  She was hospitalized for further management.    Reason for Visit: Atrial fibrillation with RVR.  COVID-19 with acute respiratory failure with hypoxia.  Consultants: Phone discussion with gastroenterology by admitting provider  Procedures: None  Antibiotics: Anti-infectives (From admission, onward)   Start     Dose/Rate Route Frequency Ordered Stop   09/02/18 1400  remdesivir 100 mg in sodium chloride 0.9 % 250 mL IVPB     100 mg 500 mL/hr over 30 Minutes Intravenous Every 24 hours 09/01/18 1232 09/06/18 1359   09/01/18 1400  remdesivir 200 mg in sodium chloride 0.9 % 250 mL IVPB     200 mg 500 mL/hr over 30 Minutes Intravenous Once 09/01/18 1232 09/01/18 1900       Subjective/Interval History:  Patient denies any complaints today, no chest pain, no nausea, no vomiting, dyspnea has significantly improved    Assessment/Plan:  Acute Hypoxic Resp. Failure due to Acute Covid 19 Viral Illness  COVID-19 Labs  Recent Labs    09/02/18 0220 09/03/18 0242 09/04/18 0240  DDIMER 1.50* 2.89* 2.92*  CRP 6.4* 5.8* 2.7*    Lab Results  Component Value Date   SARSCOV2NAA POSITIVE (A) 08/31/2018     Fever: Remains afebrile Oxygen requirements: Noted to be on room air this morning.  Saturating in the early 90s.    Antibacterials: None Remdesivir: Last day 09/05/2018 Steroids: Dexamethasone 6 mg daily Diuretics: None Actemra: No indication as yet DVT Prophylaxis: SCDs alone  Remains on room air, with saturation in the mid 90s , continue with steroids, continue with IV Remdesivir . -D-dimers slightly trending up, it is 2.9 today, hemoglobin remained stable, will start on subcu heparin . -Continue incentive spirometry.  Mobilization.  Awake prone positioning as much as possible.  CRP noted to be slightly better today.    Suspected upper GI bleed Patient apparently had coffee-ground emesis.  Hemoglobin was noted to be lower than her baseline.  Has not had any episodes of GI bleed in the hospital.  Hemoglobin is low but stable for the most part.  Continue with PPI.  CT scan of the abdomen and pelvis did not show any acute findings.  Continue to hold on antiplatelet agents.  Hemoglobin  remained stable, so I will start on subcu heparin  Atrial fibrillation with mild RVR This was noted in the emergency department.  Patient started on beta-blocker.  Dose was adjusted yesterday.  Heart rate seems to be better controlled.  TSH 0.753.  Free T4 noted to be mildly elevated at 1.56.  Significance of this is not entirely clear in the setting of acute illness.  We will recommend rechecking thyroid function test in a few weeks time.  Not a candidate for full dose anticoagulation due to suspected GI  bleed as well as her other comorbidities and poor long-term prognosis as a result of her comorbidities.  Daughter agrees with this.  We will also hold off on echocardiogram as it will not change management.  Acute blood loss anemia Baseline hemoglobin is between 10 and 11.  Noted to be 8.3 at admission.  Hemoglobin has been stable the last few days.  Continue to monitor.  History of multiple myeloma Patient was on Revlimid recently.  Discontinued on July 31.  According to notes in EMR patient has been transition to palliative  care.  She has stopped active medical treatment for her cancer.  Previous MD Discussed with her oncologist, Dr. Rogue Bussing,   Apparently her multiple myeloma is mostly in the background.  Will not affect prognosis in this acute setting.  Hypokalemia Potassium level has improved.  Magnesium 2.0.    Essential hypertension Blood pressure reasonably well controlled.  Amlodipine on hold as the patient was started on metoprolol.   DVT Prophylaxis: SCDs PUD Prophylaxis: Protonix Code Status: DNR Family Communication: D/W patient Disposition Plan: PT and OT evaluation.  Patient's son and daughter-in-law live with her.  Daughter-in-law recently diagnosed with malignancy.  So disposition remains unclear as of now.   Medications:  Scheduled: . atorvastatin  5 mg Oral Daily  . calcium carbonate  1 tablet Oral BID WC  . cholecalciferol  2,000 Units Oral BID  . dexamethasone (DECADRON) injection  6 mg Intravenous Q24H  . levothyroxine  50 mcg Oral Daily  . metoprolol tartrate  25 mg Oral BID  . pantoprazole  40 mg Oral BID  . PARoxetine  40 mg Oral QHS  . traZODone  25 mg Oral QHS   Continuous: . remdesivir 100 mg in NS 250 mL 100 mg (09/03/18 1646)   ZOX:[id number], guaiFENesin-dextromethorphan, ondansetron **OR** ondansetron (ZOFRAN) IV, traMADol   Objective:  Vital Signs  Vitals:   09/03/18 2017 09/04/18 0102 09/04/18 0103 09/04/18 0506  BP: 107/65   115/76  Pulse: 86   72  Resp: (!) 22 17  (!) 22  Temp: 97.6 F (36.4 C)   98 F (36.7 C)  TempSrc: Oral   Axillary  SpO2: 97%   97%  Weight:   64 kg   Height:   '5\' 6"'$  (1.676 m)    No intake or output data in the 24 hours ending 09/04/18 1353 Filed Weights   09/04/18 0103  Weight: 64 kg    Awake Alert, Oriented X 3, No new F.N deficits, Normal affect Symmetrical Chest wall movement, Good air movement bilaterally, CTAB RRR,No Gallops,Rubs or new Murmurs, No Parasternal Heave +ve B.Sounds, Abd Soft, No tenderness, No  rebound - guarding or rigidity. No Cyanosis, Clubbing or edema, No new Rash or bruise        Lab Results:  Data Reviewed: I have personally reviewed following labs and imaging studies  CBC: Recent Labs  Lab 08/31/18 1256 08/31/18 2051 09/01/18 0835 09/02/18 0220 09/03/18 0242 09/04/18 0240  WBC 3.5*  --  3.7* 3.9* 5.0 9.1  NEUTROABS  --   --  2.8 2.7 4.2 8.1*  HGB 8.3* 8.4* 8.3* 8.0* 8.3* 8.2*  HCT 27.3* 27.3* 27.5* 27.5* 28.5* 27.9*  MCV 82.5  --  85.1 86.5 85.6 85.1  PLT 400  --  432* 469* 476* 453*    Basic Metabolic Panel: Recent Labs  Lab 08/31/18 1256 08/31/18 2051 09/01/18 0835 09/02/18 0220 09/03/18 0242 09/04/18 0240  NA 133*  --  138 138 135 137  K 2.8*  --  3.4* 3.7 4.6 4.9  CL 102  --  106 107 104 109  CO2 18*  --  18* 20* 20* 18*  GLUCOSE 110*  --  83 81 140* 140*  BUN 21  --  15 18 27* 50*  CREATININE 1.27*  --  1.14* 1.14* 1.07* 1.49*  CALCIUM 7.2*  --  7.3* 7.3* 7.2* 7.5*  MG  --  1.9  --   --  2.0  --     GFR: Estimated Creatinine Clearance: 24.9 mL/min (A) (by C-G formula based on SCr of 1.49 mg/dL (H)).  Liver Function Tests: Recent Labs  Lab 08/31/18 1256 09/01/18 0835 09/02/18 0220 09/03/18 0242 09/04/18 0240  AST _0 14* 19  ALT _1 ALKPHOS 46 51 46 49 52  BILITOT 0.8 0.7 0.7 0.7 0.5  PROT 5.9* 6.1* 5.9* 6.2* 5.8*  ALBUMIN 2.3* 2.4* 2.5* 2.4* 2.3*    Recent Labs  Lab 08/31/18 1256  LIPASE 42    Coagulation Profile: Recent Labs  Lab 08/31/18 1542  INR 1.3*      Recent Results (from the past 240 hour(s))  SARS Coronavirus 2 [facility] order, Performed in [facility] hospital lab) Nasopharyngeal Nasopharyngeal Swab     Status: Abnormal   Collection Time: 08/31/18  3:40 PM   Specimen: Nasopharyngeal Swab  Result Value Ref Range Status   SARS Coronavirus 2 POSITIVE (A) NEGATIVE Final    Comment: RESULT CALLED TO, READ BACK BY AND VERIFIED WITH: STEPHEN JONES _2  ON 08/31/2018 BY FMW (NOTE) If  result is NEGATIVE SARS-CoV-2 target nucleic acids are NOT DETECTED. The SARS-CoV-2 RNA is generally detectable in upper and lower  respiratory specimens during the acute phase of infection. The lowest  concentration of SARS-CoV-2 viral copies this assay can detect is 250  copies / mL. A negative result does not preclude SARS-CoV-2 infection  and should not be used as the sole basis for treatment or other  patient management decisions.  A negative result may occur with  improper specimen collection / handling, submission of specimen other  than nasopharyngeal swab, presence of viral mutation(s) within the  areas targeted by this assay, and inadequate number of viral copies  (<250 copies / mL). A negative result must be combined with clinical  observations, patient history, and epidemiological information. If result is POSITIVE SARS-CoV-2 target nucleic acids are DETECTE D. The SARS-CoV-2 RNA is generally detectable in upper and lower  respiratory specimens during the acute phase of infection.  Positive  results are indicative of active infection with SARS-CoV-2.  Clinical  correlation with patient history and other diagnostic information is  necessary to determine patient infection status.  Positive results do  not rule out bacterial infection or co-infection with other viruses. If result is PRESUMPTIVE POSTIVE SARS-CoV-2 nucleic acids MAY BE PRESENT.   A presumptive positive result was obtained on the submitted specimen  and confirmed on repeat testing.  While 2019 novel coronavirus  (SARS-CoV-2) nucleic acids may be present in the submitted sample  additional confirmatory testing may be necessary for epidemiological  and / or clinical management purposes  to differentiate between  SARS-CoV-2 and other Sarbecovirus currently known to infect humans.  If clinically indicated additional testing with an alternate test  methodology (LAB745 3) is advised. The SARS-CoV-2 RNA is generally   detectable in upper and lower respiratory specimens during the acute  phase of infection. The expected result is Negative. Fact  Sheet for Patients:  StrictlyIdeas.no Fact Sheet for Healthcare Providers: BankingDealers.co.za This test is not yet approved or cleared by the [place] FDA and has been authorized for detection and/or diagnosis of SARS-CoV-2 by FDA under an Emergency Use Authorization (EUA).  This EUA will remain in effect (meaning this test can be used) for the duration of the COVID-19 declaration under Section 564(b)(1) of the Act, 21 U.S.C. section 360bbb-3(b)(1), unless the authorization is terminated or revoked sooner. Performed at [facility], [address], [place], [place] [address]   Urine culture     Status: None   Collection Time: 08/31/18  4:30 PM   Specimen: Urine, Clean Catch  Result Value Ref Range Status   Specimen Description   Final    URINE, CLEAN CATCH Performed at [facility], [address] [place], [place] [address]    Special Requests   Final    NONE Performed at [facility], [address], [place], [place] [address]    Culture   Final    Multiple bacterial morphotypes present, none predominant. Suggest appropriate recollection if clinically indicated.   Report Status 09/02/2018 FINAL  Final      Radiology Studies: No results found.     LOS: 3 days   Phillips Climes MD  [facility] Pager on [email or web address]  09/04/2018, 1:53 PM

## 2018-09-05 DIAGNOSIS — N179 Acute kidney failure, unspecified: Secondary | ICD-10-CM

## 2018-09-05 LAB — COMPREHENSIVE METABOLIC PANEL
ALT: 18 U/L (ref 0–44)
AST: 21 U/L (ref 15–41)
Albumin: 2.5 g/dL — ABNORMAL LOW (ref 3.5–5.0)
Alkaline Phosphatase: 59 U/L (ref 38–126)
Anion gap: 15 (ref 5–15)
BUN: 62 mg/dL — ABNORMAL HIGH (ref 8–23)
CO2: 18 mmol/L — ABNORMAL LOW (ref 22–32)
Calcium: 7.9 mg/dL — ABNORMAL LOW (ref 8.9–10.3)
Chloride: 105 mmol/L (ref 98–111)
Creatinine, Ser: 1.68 mg/dL — ABNORMAL HIGH (ref 0.44–1.00)
GFR calc Af Amer: 31 mL/min — ABNORMAL LOW (ref >60)
GFR calc non Af Amer: 27 mL/min — ABNORMAL LOW (ref >60)
Glucose, Bld: 104 mg/dL — ABNORMAL HIGH (ref 70–99)
Potassium: 5.4 mmol/L — ABNORMAL HIGH (ref 3.5–5.1)
Sodium: 138 mmol/L (ref 135–145)
Total Bilirubin: 0.4 mg/dL (ref 0.3–1.2)
Total Protein: 6 g/dL — ABNORMAL LOW (ref 6.5–8.1)

## 2018-09-05 LAB — CBC WITH DIFFERENTIAL/PLATELET
Abs Immature Granulocytes: 0.07 10*3/uL (ref 0.00–0.07)
Basophils Absolute: 0 10*3/uL (ref 0.0–0.1)
Basophils Relative: 0 %
Eosinophils Absolute: 0 10*3/uL (ref 0.0–0.5)
Eosinophils Relative: 0 %
HCT: 29.1 % — ABNORMAL LOW (ref 36.0–46.0)
Hemoglobin: 8.4 g/dL — ABNORMAL LOW (ref 12.0–15.0)
Immature Granulocytes: 1 %
Lymphocytes Relative: 7 %
Lymphs Abs: 0.6 10*3/uL — ABNORMAL LOW (ref 0.7–4.0)
MCH: 24.9 pg — ABNORMAL LOW (ref 26.0–34.0)
MCHC: 28.9 g/dL — ABNORMAL LOW (ref 30.0–36.0)
MCV: 86.4 fL (ref 80.0–100.0)
Monocytes Absolute: 0.6 10*3/uL (ref 0.1–1.0)
Monocytes Relative: 6 %
Neutro Abs: 8.6 10*3/uL — ABNORMAL HIGH (ref 1.7–7.7)
Neutrophils Relative %: 86 %
Platelets: 464 10*3/uL — ABNORMAL HIGH (ref 150–400)
RBC: 3.37 MIL/uL — ABNORMAL LOW (ref 3.87–5.11)
RDW: 18.7 % — ABNORMAL HIGH (ref 11.5–15.5)
WBC: 9.9 10*3/uL (ref 4.0–10.5)
nRBC: 0.3 % — ABNORMAL HIGH (ref 0.0–0.2)

## 2018-09-05 LAB — SODIUM, URINE, RANDOM: Sodium, Ur: 44 mmol/L

## 2018-09-05 LAB — C-REACTIVE PROTEIN: CRP: 1.4 mg/dL — ABNORMAL HIGH (ref <1.0)

## 2018-09-05 LAB — D-DIMER, QUANTITATIVE: D-Dimer, Quant: 2.56 ug/mL-FEU — ABNORMAL HIGH (ref 0.00–0.50)

## 2018-09-05 MED ORDER — SODIUM CHLORIDE 0.9 % IV SOLN
INTRAVENOUS | Status: DC
  Administered 2018-09-05: 14:00:00 via INTRAVENOUS

## 2018-09-05 NOTE — Progress Notes (Signed)
PROGRESS NOTE  Theresa Lynch SNK:[id number] DOB: February 21, 1929 DOA: 09/01/2018  PCP: [name], MD  Brief History/Interval Summary: 85 y.o. female with medical history significant of MM.  Patient presents to the ED with c/o N/V for the past 1 day.  Emesis was dark / coffee ground in color and concerning for hematemesis. She has also had  diarrhea for the past 1 week.  Also had productive cough for the past 10 days or more for which her oncologist stopped her revlimid on 7/31.  She was found to be positive for COVID-19.  She was also noted to have a lower hemoglobin than her baseline.  She went into atrial fibrillation with mild RVR while she was in the ED.  She was hospitalized for further management.    Subjective/Interval History:  Patient denies any complaints today, no chest pain, no nausea, no vomiting,     Antibiotics: Anti-infectives (From admission, onward)   Start     Dose/Rate Route Frequency Ordered Stop   09/02/18 1400  remdesivir 100 mg in sodium chloride 0.9 % 250 mL IVPB     100 mg 500 mL/hr over 30 Minutes Intravenous Every 24 hours 09/01/18 1232 09/06/18 1359   09/01/18 1400  remdesivir 200 mg in sodium chloride 0.9 % 250 mL IVPB     200 mg 500 mL/hr over 30 Minutes Intravenous Once 09/01/18 1232 09/01/18 1900          Assessment/Plan:  Acute Hypoxic Resp. Failure due to Acute Covid 19 Viral Illness  COVID-19 Labs  Recent Labs    09/03/18 0242 09/04/18 0240 09/05/18 0130  DDIMER 2.89* 2.92* 2.56*  CRP 5.8* 2.7* 1.4*    Lab Results  Component Value Date   SARSCOV2NAA POSITIVE (A) 08/31/2018     Fever: Remains afebrile Oxygen requirements: Noted to be on room air this morning.  Saturating in the early 90s.   Antibacterials: None Remdesivir: Last day 09/05/2018 Steroids: Dexamethasone 6 mg daily Diuretics: None Actemra: No indication as yet DVT Prophylaxis: SCDs alone  Remains on room air, with saturation in the mid 90s , continue  with steroids, continue with IV Remdesivir . -D-dimers slightly trending up, it is 2.9 today, hemoglobin remained stable, will start on subcu heparin . -Continue incentive spirometry.  Mobilization.  Awake prone positioning as much as possible.  CRP noted to be slightly better today.    Suspected upper GI bleed Patient apparently had coffee-ground emesis.  Hemoglobin was noted to be lower than her baseline.  Has not had any episodes of GI bleed in the hospital.  Hemoglobin is low but stable for the most part.  Continue with PPI.  CT scan of the abdomen and pelvis did not show any acute findings.  Continue to hold on antiplatelet agents.  Hemoglobin  remained stable, she is currently on subcu heparin twice daily for DVT prophylaxis, if remains stable will increase to 3 times daily  Atrial fibrillation with mild RVR This was noted in the emergency department.  Patient started on beta-blocker.  Dose was adjusted yesterday.  Heart rate seems to be better controlled.  TSH 0.753.  Free T4 noted to be mildly elevated at 1.56.  Significance of this is not entirely clear in the setting of acute illness.  We will recommend rechecking thyroid function test in a few weeks time.  Not a candidate for full dose anticoagulation due to suspected GI bleed as well as her other comorbidities and poor long-term prognosis as a result of  her comorbidities.  Daughter agrees with this.  We will also hold off on echocardiogram as it will not change management.  Acute blood loss anemia Baseline hemoglobin is between 10 and 11.  Noted to be 8.3 at admission.  Hemoglobin has been stable the last few days.  Continue to monitor.  AKI -Creatinine increased to 1.68 today, as well BUN rising, most likely volume depletion, will check urine sodium, monitor BMP closely and start on IV fluids  History of multiple myeloma Patient was on Revlimid recently.  Discontinued on July 31.  According to notes in EMR patient has been transition  to palliative care.  She has stopped active medical treatment for her cancer.  Previous MD Discussed with her oncologist, Dr. Rogue Bussing,   Apparently her multiple myeloma is mostly in the background.  Will not affect prognosis in this acute setting.  Hypokalemia Potassium level has improved.  Magnesium 2.0.    Essential hypertension Blood pressure reasonably well controlled.  Amlodipine on hold as the patient was started on metoprolol.   DVT Prophylaxis: SCDs PUD Prophylaxis: Protonix Code Status: DNR Family Communication: D/W daughter via phone 8/12 Disposition Plan: PT and OT evaluation.  Patient's son and daughter-in-law live with her.  Daughter-in-law recently diagnosed with malignancy.  So disposition remains unclear as of now.  Consultants: Phone discussion with gastroenterology by admitting provider  Procedures: None   Medications:  Scheduled: . atorvastatin  5 mg Oral Daily  . calcium carbonate  1 tablet Oral BID WC  . cholecalciferol  2,000 Units Oral BID  . dexamethasone (DECADRON) injection  6 mg Intravenous Q24H  . heparin injection (subcutaneous)  5,000 Units Subcutaneous Q12H  . levothyroxine  50 mcg Oral Daily  . metoprolol tartrate  25 mg Oral BID  . pantoprazole  40 mg Oral BID  . PARoxetine  40 mg Oral QHS  . traZODone  25 mg Oral QHS   Continuous: . sodium chloride    . remdesivir 100 mg in NS 250 mL 100 mg (09/04/18 1528)   BZJ:IRCVELFYBOFBP, guaiFENesin-dextromethorphan, ondansetron **OR** ondansetron (ZOFRAN) IV, traMADol   Objective:  Vital Signs  Vitals:   09/04/18 2004 09/04/18 2129 09/05/18 0530 09/05/18 0910  BP: 110/78 118/69 118/90 107/72  Pulse: (!) 104 91 84 92  Resp: 13  17   Temp: 97.7 F (36.5 C)  98.4 F (36.9 C)   TempSrc: Oral  Axillary   SpO2: 97%  96%   Weight:      Height:       No intake or output data in the 24 hours ending 09/05/18 1309 Filed Weights   09/04/18 0103  Weight: 64 kg    Awake Alert, Oriented X 3,  No new F.N deficits, Normal affect Symmetrical Chest wall movement, Good air movement bilaterally, CTAB RRR,No Gallops,Rubs or new Murmurs, No Parasternal Heave +ve B.Sounds, Abd Soft, No tenderness, No rebound - guarding or rigidity. No Cyanosis, Clubbing or edema, No new Rash or bruise         Lab Results:  Data Reviewed: I have personally reviewed following labs and imaging studies  CBC: Recent Labs  Lab 09/01/18 0835 09/02/18 0220 09/03/18 0242 09/04/18 0240 09/05/18 0130  WBC 3.7* 3.9* 5.0 9.1 9.9  NEUTROABS 2.8 2.7 4.2 8.1* 8.6*  HGB 8.3* 8.0* 8.3* 8.2* 8.4*  HCT 27.5* 27.5* 28.5* 27.9* 29.1*  MCV 85.1 86.5 85.6 85.1 86.4  PLT 432* 469* 476* 453* 464*    Basic Metabolic Panel: Recent Labs  Lab 08/31/18  2051 09/01/18 0835 09/02/18 0220 09/03/18 0242 09/04/18 0240 09/05/18 0130  NA  --  138 138 135 137 138  K  --  3.4* 3.7 4.6 4.9 5.4*  CL  --  106 107 104 109 105  CO2  --  18* 20* 20* 18* 18*  GLUCOSE  --  83 81 140* 140* 104*  BUN  --  15 18 27* 50* 62*  CREATININE  --  1.14* 1.14* 1.07* 1.49* 1.68*  CALCIUM  --  7.3* 7.3* 7.2* 7.5* 7.9*  MG 1.9  --   --  2.0  --   --     GFR: Estimated Creatinine Clearance: 22.1 mL/min (A) (by C-G formula based on SCr of 1.68 mg/dL (H)).  Liver Function Tests: Recent Labs  Lab 09/01/18 0835 09/02/18 0220 09/03/18 0242 09/04/18 0240 09/05/18 0130  AST 21 19 14* 19 21  ALT '10 11 11 13 18  ' ALKPHOS 51 46 49 52 59  BILITOT 0.7 0.7 0.7 0.5 0.4  PROT 6.1* 5.9* 6.2* 5.8* 6.0*  ALBUMIN 2.4* 2.5* 2.4* 2.3* 2.5*    Recent Labs  Lab 08/31/18 1256  LIPASE 42    Coagulation Profile: Recent Labs  Lab 08/31/18 1542  INR 1.3*      Recent Results (from the past 240 hour(s))  SARS Coronavirus 2 [facility] order, Performed in [facility] hospital lab) Nasopharyngeal Nasopharyngeal Swab     Status: Abnormal   Collection Time: 08/31/18  3:40 PM   Specimen: Nasopharyngeal Swab  Result Value Ref Range Status    SARS Coronavirus 2 POSITIVE (A) NEGATIVE Final    Comment: RESULT CALLED TO, READ BACK BY AND VERIFIED WITH: STEPHEN JONES '@1827'  ON 08/31/2018 BY FMW (NOTE) If result is NEGATIVE SARS-CoV-2 target nucleic acids are NOT DETECTED. The SARS-CoV-2 RNA is generally detectable in upper and lower  respiratory specimens during the acute phase of infection. The lowest  concentration of SARS-CoV-2 viral copies this assay can detect is 250  copies / mL. A negative result does not preclude SARS-CoV-2 infection  and should not be used as the sole basis for treatment or other  patient management decisions.  A negative result may occur with  improper specimen collection / handling, submission of specimen other  than nasopharyngeal swab, presence of viral mutation(s) within the  areas targeted by this assay, and inadequate number of viral copies  (<250 copies / mL). A negative result must be combined with clinical  observations, patient history, and epidemiological information. If result is POSITIVE SARS-CoV-2 target nucleic acids are DETECTE D. The SARS-CoV-2 RNA is generally detectable in upper and lower  respiratory specimens during the acute phase of infection.  Positive  results are indicative of active infection with SARS-CoV-2.  Clinical  correlation with patient history and other diagnostic information is  necessary to determine patient infection status.  Positive results do  not rule out bacterial infection or co-infection with other viruses. If result is PRESUMPTIVE POSTIVE SARS-CoV-2 nucleic acids MAY BE PRESENT.   A presumptive positive result was obtained on the submitted specimen  and confirmed on repeat testing.  While 2019 novel coronavirus  (SARS-CoV-2) nucleic acids may be present in the submitted sample  additional confirmatory testing may be necessary for epidemiological  and / or clinical management purposes  to differentiate between  SARS-CoV-2 and other Sarbecovirus currently  known to infect humans.  If clinically indicated additional testing with an alternate test  methodology (LAB745 3) is advised. The SARS-CoV-2 RNA is  generally  detectable in upper and lower respiratory specimens during the acute  phase of infection. The expected result is Negative. Fact Sheet for Patients:  StrictlyIdeas.no Fact Sheet for Healthcare Providers: BankingDealers.co.za This test is not yet approved or cleared by the [place] FDA and has been authorized for detection and/or diagnosis of SARS-CoV-2 by FDA under an Emergency Use Authorization (EUA).  This EUA will remain in effect (meaning this test can be used) for the duration of the COVID-19 declaration under Section 564(b)(1) of the Act, 21 U.S.C. section 360bbb-3(b)(1), unless the authorization is terminated or revoked sooner. Performed at [facility], [address] [place], [place] [address]   Urine culture     Status: None   Collection Time: 08/31/18  4:30 PM   Specimen: Urine, Clean Catch  Result Value Ref Range Status   Specimen Description   Final    URINE, CLEAN CATCH Performed at [facility], [address], [place], [place] [address]    Special Requests   Final    NONE Performed at [facility], [address] [place], [place] [address]    Culture   Final    Multiple bacterial morphotypes present, none predominant. Suggest appropriate recollection if clinically indicated.   Report Status 09/02/2018 FINAL  Final      Radiology Studies: No results found.     LOS: 4 days   Phillips Climes MD  [facility] Pager on [email or web address]  09/05/2018, 1:09 PM

## 2018-09-06 DIAGNOSIS — N179 Acute kidney failure, unspecified: Secondary | ICD-10-CM

## 2018-09-06 LAB — CBC WITH DIFFERENTIAL/PLATELET
Abs Immature Granulocytes: 0.08 10*3/uL — ABNORMAL HIGH (ref 0.00–0.07)
Basophils Absolute: 0 10*3/uL (ref 0.0–0.1)
Basophils Relative: 0 %
Eosinophils Absolute: 0 10*3/uL (ref 0.0–0.5)
Eosinophils Relative: 0 %
HCT: 28.5 % — ABNORMAL LOW (ref 36.0–46.0)
Hemoglobin: 8.3 g/dL — ABNORMAL LOW (ref 12.0–15.0)
Immature Granulocytes: 1 %
Lymphocytes Relative: 5 %
Lymphs Abs: 0.6 10*3/uL — ABNORMAL LOW (ref 0.7–4.0)
MCH: 24.6 pg — ABNORMAL LOW (ref 26.0–34.0)
MCHC: 29.1 g/dL — ABNORMAL LOW (ref 30.0–36.0)
MCV: 84.6 fL (ref 80.0–100.0)
Monocytes Absolute: 0.9 10*3/uL (ref 0.1–1.0)
Monocytes Relative: 7 %
Neutro Abs: 10.4 10*3/uL — ABNORMAL HIGH (ref 1.7–7.7)
Neutrophils Relative %: 87 %
Platelets: 431 10*3/uL — ABNORMAL HIGH (ref 150–400)
RBC: 3.37 MIL/uL — ABNORMAL LOW (ref 3.87–5.11)
RDW: 18.5 % — ABNORMAL HIGH (ref 11.5–15.5)
WBC: 11.9 10*3/uL — ABNORMAL HIGH (ref 4.0–10.5)
nRBC: 0 % (ref 0.0–0.2)

## 2018-09-06 LAB — COMPREHENSIVE METABOLIC PANEL
ALT: 16 U/L (ref 0–44)
AST: 15 U/L (ref 15–41)
Albumin: 2.4 g/dL — ABNORMAL LOW (ref 3.5–5.0)
Alkaline Phosphatase: 60 U/L (ref 38–126)
Anion gap: 12 (ref 5–15)
BUN: 61 mg/dL — ABNORMAL HIGH (ref 8–23)
CO2: 17 mmol/L — ABNORMAL LOW (ref 22–32)
Calcium: 8.2 mg/dL — ABNORMAL LOW (ref 8.9–10.3)
Chloride: 106 mmol/L (ref 98–111)
Creatinine, Ser: 1.51 mg/dL — ABNORMAL HIGH (ref 0.44–1.00)
GFR calc Af Amer: 36 mL/min — ABNORMAL LOW (ref >60)
GFR calc non Af Amer: 31 mL/min — ABNORMAL LOW (ref >60)
Glucose, Bld: 128 mg/dL — ABNORMAL HIGH (ref 70–99)
Potassium: 4.8 mmol/L (ref 3.5–5.1)
Sodium: 135 mmol/L (ref 135–145)
Total Bilirubin: 0.6 mg/dL (ref 0.3–1.2)
Total Protein: 5.8 g/dL — ABNORMAL LOW (ref 6.5–8.1)

## 2018-09-06 LAB — C-REACTIVE PROTEIN: CRP: 1.1 mg/dL — ABNORMAL HIGH (ref <1.0)

## 2018-09-06 LAB — D-DIMER, QUANTITATIVE: D-Dimer, Quant: 2.54 ug/mL-FEU — ABNORMAL HIGH (ref 0.00–0.50)

## 2018-09-06 MED ORDER — HEPARIN SODIUM (PORCINE) 5000 UNIT/ML IJ SOLN: 5000.0000 [IU] | Freq: Three times a day (TID) | INTRAMUSCULAR | Status: DC

## 2018-09-06 MED ORDER — ENSURE ENLIVE PO LIQD
237.0000 mL | Freq: Two times a day (BID) | ORAL | Status: DC
  Administered 2018-09-06 – 2018-09-13 (×8): 237 mL via ORAL

## 2018-09-06 MED ORDER — HEPARIN SODIUM (PORCINE) 5000 UNIT/ML IJ SOLN
5000.0000 [IU] | Freq: Three times a day (TID) | INTRAMUSCULAR | Status: DC
  Administered 2018-09-06 – 2018-09-13 (×22): 5000 [IU] via SUBCUTANEOUS
  Filled 2018-09-06 (×22): qty 1

## 2018-09-06 NOTE — Progress Notes (Signed)
Spoke with patients daughter Max Sane and updated on pt condition. Pt agreed over the phone to go to a rehab facility at discharge to work on her strength. Answered all questions and concerns.

## 2018-09-06 NOTE — Progress Notes (Signed)
Physical Therapy Treatment Patient Details Name: Theresa Lynch MRN: [id number] DOB: 13-Aug-1929 Today's Date: 09/06/2018    History of Present Illness 85 yo female presenting to the ED with nausea and vomiting; Emesis is dark / coffee ground in color. COVID positive. PMH including arthritis, depression, left hip fx with IM nail placement (2019), HTN, and MM.    PT Comments    The patient is much weaker. Much difficulty and mod max assist to ambulate 20', turn and sit into recliner. PT had to provide support to tuen around. Patient presents with little interest in eating and mobilizing.recommend SNF at this time in hopes that pt. Will perk up and mobilize. Dr. Johnette Abraham aware and will discuss with family PT recommendation.   PT recommended to pt. To consider rehab, pt stated that she has help at home.    Follow Up Recommendations  SNF     Equipment Recommendations  None recommended by PT    Recommendations for Other Services       Precautions / Restrictions Precautions Precautions: Fall Precaution Comments: incontinent    Mobility  Bed Mobility Overal bed mobility: Needs Assistance Bed Mobility: Supine to Sit     Supine to sit: Mod assist     General bed mobility comments: pt stating"I need  help. extra time and assit with trunk. Also much encouragement to get up  Transfers Overall transfer level: Needs assistance Equipment used: Rolling walker (2 wheeled) Transfers: Sit to/from [employer] Transfers   Stand pivot transfers: Min assist       General transfer comment: Min A for safety and balance to [facility] , incontinent of bm in bed  Ambulation/Gait Ambulation/Gait assistance: Mod assist;Max assist Gait Distance (Feet): 20 Feet   Gait Pattern/deviations: Step-to pattern;Antalgic;Staggering right     General Gait Details: pt barely made it to recliner, had to force a turn to sit down in recliner as pt. was not progressing feet.   Stairs              Wheelchair Mobility    Modified Rankin (Stroke Patients Only)       Balance     Sitting balance-Leahy Scale: Fair     Standing balance support: Bilateral upper extremity supported;During functional activity;Single extremity supported Standing balance-Leahy Scale: Poor                              Cognition Arousal/Alertness: Awake/alert Behavior During Therapy: WFL for tasks assessed/performed;Flat affect Overall Cognitive Status: Impaired/Different from baseline Area of Impairment: Safety/judgement;Awareness                         Safety/Judgement: Decreased awareness of deficits     General Comments: pt feels her family can meet all her needs. Dtr in law Positive for covid and new dx breast ca per Dr. Johnette Abraham      Exercises      General Comments        Pertinent Vitals/Pain Faces Pain Scale: No hurt    Home Living                      Prior Function            PT Goals (current goals can now be found in the care plan section) Progress towards PT goals: Not progressing toward goals - comment(muchweaker,noteating)    Frequency    Min 2X/week  PT Plan Discharge plan needs to be updated;Frequency needs to be updated    Co-evaluation              AM-PAC PT "6 Clicks" Mobility   Outcome Measure  Help needed turning from your back to your side while in a flat bed without using bedrails?: A Lot Help needed moving from lying on your back to sitting on the side of a flat bed without using bedrails?: A Lot Help needed moving to and from a bed to a chair (including a wheelchair)?: Total Help needed standing up from a chair using your arms (e.g., wheelchair or bedside chair)?: Total Help needed to walk in hospital room?: Total   6 Click Score: 7    End of Session Equipment Utilized During Treatment: Gait belt Activity Tolerance: Patient limited by fatigue Patient left: in chair;with call bell/phone within  reach Nurse Communication: Mobility status PT Visit Diagnosis: Muscle weakness (generalized) (M62.81);Difficulty in walking, not elsewhere classified (R26.2)     Time: 6861-6837 PT Time Calculation (min) (ACUTE ONLY): 33 min  Charges:  $Gait Training: 8-22 mins $Self Care/Home Management: [place]   Office [phone]    Claretha Cooper 09/06/2018, 8:48 AM

## 2018-09-06 NOTE — Progress Notes (Signed)
PROGRESS NOTE  Theresa Lynch WKM:[id number] DOB: 10/03/1929 DOA: 09/01/2018  PCP: [name], MD  Brief History/Interval Summary: 85 y.o. female with medical history significant of MM.  Patient presents to the ED with c/o N/V for the past 1 day.  Emesis was dark / coffee ground in color and concerning for hematemesis. She has also had  diarrhea for the past 1 week.  Also had productive cough for the past 10 days or more for which her oncologist stopped her revlimid on 7/31.  She was found to be positive for COVID-19.  She was also noted to have a lower hemoglobin than her baseline.  She went into atrial fibrillation with mild RVR while she was in the ED.  She was hospitalized for further management.    Subjective/Interval History:  Patient denies any complaints today, no chest pain, no nausea, no vomiting,     Antibiotics: Anti-infectives (From admission, onward)   Start     Dose/Rate Route Frequency Ordered Stop   09/02/18 1400  remdesivir 100 mg in sodium chloride 0.9 % 250 mL IVPB     100 mg 500 mL/hr over 30 Minutes Intravenous Every 24 hours 09/01/18 1232 09/05/18 1429   09/01/18 1400  remdesivir 200 mg in sodium chloride 0.9 % 250 mL IVPB     200 mg 500 mL/hr over 30 Minutes Intravenous Once 09/01/18 1232 09/01/18 1900          Assessment/Plan:  Acute Hypoxic Resp. Failure due to Acute Covid 19 Viral Illness  COVID-19 Labs  Recent Labs    09/04/18 0240 09/05/18 0130 09/06/18 0236  DDIMER 2.92* 2.56* 2.54*  CRP 2.7* 1.4* 1.1*    Lab Results  Component Value Date   SARSCOV2NAA POSITIVE (A) 08/31/2018     Fever: Remains afebrile Oxygen requirements: She is currently on room air Antibacterials: None Remdesivir: Last day 09/05/2018 Steroids: Dexamethasone 6 mg daily Diuretics: None Actemra: No indication as yet DVT Prophylaxis: SCDs/subcu heparin  Remains on room air, with saturation in the mid 90s , continue with steroids, finished her IV  Remdesivir course. -D-Dimer is elevated, but trending down, on DVT prophylaxis subcu heparin started yesterday.  COVID-19 Labs  Recent Labs    09/04/18 0240 09/05/18 0130 09/06/18 0236  DDIMER 2.92* 2.56* 2.54*  CRP 2.7* 1.4* 1.1*    Lab Results  Component Value Date   SARSCOV2NAA POSITIVE (A) 08/31/2018     Suspected upper GI bleed Patient apparently had coffee-ground emesis.  Hemoglobin was noted to be lower than her baseline.  Has not had any episodes of GI bleed in the hospital.  Hemoglobin is low but stable for the most part.  Continue with PPI.  CT scan of the abdomen and pelvis did not show any acute findings.  Continue to hold on antiplatelet agents.  Hemoglobin  remained stable, started on subcu heparin for DVT prophylaxis.  Atrial fibrillation with mild RVR This was noted in the emergency department.  Patient started on beta-blocker.  Dose was adjusted yesterday.  Heart rate seems to be better controlled.  TSH 0.753.  Free T4 noted to be mildly elevated at 1.56.  Significance of this is not entirely clear in the setting of acute illness.  We will recommend rechecking thyroid function test in a few weeks time.  Not a candidate for full dose anticoagulation due to suspected GI bleed as well as her other comorbidities and poor long-term prognosis as a result of her comorbidities.  Daughter agrees with this.  We will also hold off on echocardiogram as it will not change management.  Acute blood loss anemia Baseline hemoglobin is between 10 and 11.  Noted to be 8.3 at admission.  Hemoglobin has been stable the last few days.  Continue to monitor.  AKI -Creatinine increased to 1.68 today, as well BUN rising, most likely volume depletion, improving with IV fluids, continue with normal saline 50 cc/h and monitor closely for signs of volume overload.  History of multiple myeloma Patient was on Revlimid recently.  Discontinued on July 31.  According to notes in EMR patient has been  transition to palliative care.  She has stopped active medical treatment for her cancer.  Previous MD Discussed with her oncologist, Dr. Rogue Bussing,   Apparently her multiple myeloma is mostly in the background.  Will not affect prognosis in this acute setting.  Hypokalemia Potassium level has improved.  Magnesium 2.0.    Essential hypertension Blood pressure reasonably well controlled.  Amlodipine on hold as the patient was started on metoprolol.   DVT Prophylaxis: SCDs PUD Prophylaxis: Protonix Code Status: DNR Family Communication: D/W daughter via phone  Disposition Plan: Patient was seen by PT, recommendation for SNF placement, discussing with daughter to see if patient is agreeable for rehab placement, otherwise she will go home with home health  Consultants: Phone discussion with gastroenterology by admitting provider  Procedures: None   Medications:  Scheduled:  atorvastatin  5 mg Oral Daily   calcium carbonate  1 tablet Oral BID WC   cholecalciferol  2,000 Units Oral BID   dexamethasone (DECADRON) injection  6 mg Intravenous Q24H   heparin injection (subcutaneous)  5,000 Units Subcutaneous Q8H   levothyroxine  50 mcg Oral Daily   metoprolol tartrate  25 mg Oral BID   pantoprazole  40 mg Oral BID   PARoxetine  40 mg Oral QHS   traZODone  25 mg Oral QHS   Continuous:  sodium chloride Stopped (09/05/18 1359)   ZDG:[id number], guaiFENesin-dextromethorphan, ondansetron **OR** ondansetron (ZOFRAN) IV, traMADol   Objective:  Vital Signs  Vitals:   09/05/18 2031 09/05/18 2034 09/06/18 0629 09/06/18 0913  BP:   115/75   Pulse:      Resp:      Temp:  98 F (36.7 C) 97.8 F (36.6 C) 97.7 F (36.5 C)  TempSrc: Axillary Axillary Oral Oral  SpO2: 96%     Weight:      Height:        Intake/Output Summary (Last 24 hours) at 09/06/2018 1040 Last data filed at 09/06/2018 0500 Gross per 24 hour  Intake 601.43 ml  Output --  Net 601.43 ml   Filed  Weights   09/04/18 0103  Weight: 64 kg    Awake Alert, Oriented X 3, No new F.N deficits, frail,  Symmetrical Chest wall movement, Good air movement bilaterally, CTAB RRR,No Gallops,Rubs or new Murmurs, No Parasternal Heave +ve B.Sounds, Abd Soft, No tenderness, No rebound - guarding or rigidity. No Cyanosis, Clubbing or edema, No new Rash or bruise       Lab Results:  Data Reviewed: I have personally reviewed following labs and imaging studies  CBC: Recent Labs  Lab 09/02/18 0220 09/03/18 0242 09/04/18 0240 09/05/18 0130 09/06/18 0236  WBC 3.9* 5.0 9.1 9.9 11.9*  NEUTROABS 2.7 4.2 8.1* 8.6* 10.4*  HGB 8.0* 8.3* 8.2* 8.4* 8.3*  HCT 27.5* 28.5* 27.9* 29.1* 28.5*  MCV 86.5 85.6 85.1 86.4 84.6  PLT 469* 476* 453* 464* 431*  Basic Metabolic Panel: Recent Labs  Lab 08/31/18 2051  09/02/18 0220 09/03/18 0242 09/04/18 0240 09/05/18 0130 09/06/18 0236  NA  --    < > 138 135 137 138 135  K  --    < > 3.7 4.6 4.9 5.4* 4.8  CL  --    < > 107 104 109 105 106  CO2  --    < > 20* 20* 18* 18* 17*  GLUCOSE  --    < > 81 140* 140* 104* 128*  BUN  --    < > 18 27* 50* 62* 61*  CREATININE  --    < > 1.14* 1.07* 1.49* 1.68* 1.51*  CALCIUM  --    < > 7.3* 7.2* 7.5* 7.9* 8.2*  MG 1.9  --   --  2.0  --   --   --    < > = values in this interval not displayed.    GFR: Estimated Creatinine Clearance: 24.6 mL/min (A) (by C-G formula based on SCr of 1.51 mg/dL (H)).  Liver Function Tests: Recent Labs  Lab 09/02/18 0220 09/03/18 0242 09/04/18 0240 09/05/18 0130 09/06/18 0236  AST 19 14* '19 21 15  ' ALT '11 11 13 18 16  ' ALKPHOS 46 49 52 59 60  BILITOT 0.7 0.7 0.5 0.4 0.6  PROT 5.9* 6.2* 5.8* 6.0* 5.8*  ALBUMIN 2.5* 2.4* 2.3* 2.5* 2.4*    Recent Labs  Lab 08/31/18 1256  LIPASE 42    Coagulation Profile: Recent Labs  Lab 08/31/18 1542  INR 1.3*      Recent Results (from the past 240 hour(s))  SARS Coronavirus 2 [facility] order, Performed in [facility]  hospital lab) Nasopharyngeal Nasopharyngeal Swab     Status: Abnormal   Collection Time: 08/31/18  3:40 PM   Specimen: Nasopharyngeal Swab  Result Value Ref Range Status   SARS Coronavirus 2 POSITIVE (A) NEGATIVE Final    Comment: RESULT CALLED TO, READ BACK BY AND VERIFIED WITH: STEPHEN JONES '@[id number]'  ON 08/31/2018 BY FMW (NOTE) If result is NEGATIVE SARS-CoV-2 target nucleic acids are NOT DETECTED. The SARS-CoV-2 RNA is generally detectable in upper and lower  respiratory specimens during the acute phase of infection. The lowest  concentration of SARS-CoV-2 viral copies this assay can detect is 250  copies / mL. A negative result does not preclude SARS-CoV-2 infection  and should not be used as the sole basis for treatment or other  patient management decisions.  A negative result may occur with  improper specimen collection / handling, submission of specimen other  than nasopharyngeal swab, presence of viral mutation(s) within the  areas targeted by this assay, and inadequate number of viral copies  (<250 copies / mL). A negative result must be combined with clinical  observations, patient history, and epidemiological information. If result is POSITIVE SARS-CoV-2 target nucleic acids are DETECTE D. The SARS-CoV-2 RNA is generally detectable in upper and lower  respiratory specimens during the acute phase of infection.  Positive  results are indicative of active infection with SARS-CoV-2.  Clinical  correlation with patient history and other diagnostic information is  necessary to determine patient infection status.  Positive results do  not rule out bacterial infection or co-infection with other viruses. If result is PRESUMPTIVE POSTIVE SARS-CoV-2 nucleic acids MAY BE PRESENT.   A presumptive positive result was obtained on the submitted specimen  and confirmed on repeat testing.  While 2019 novel coronavirus  (SARS-CoV-2) nucleic acids may be present  in the submitted sample    additional confirmatory testing may be necessary for epidemiological  and / or clinical management purposes  to differentiate between  SARS-CoV-2 and other Sarbecovirus currently known to infect humans.  If clinically indicated additional testing with an alternate test  methodology (LAB745 3) is advised. The SARS-CoV-2 RNA is generally  detectable in upper and lower respiratory specimens during the acute  phase of infection. The expected result is Negative. Fact Sheet for Patients:  StrictlyIdeas.no Fact Sheet for Healthcare Providers: BankingDealers.co.za This test is not yet approved or cleared by the [place] FDA and has been authorized for detection and/or diagnosis of SARS-CoV-2 by FDA under an Emergency Use Authorization (EUA).  This EUA will remain in effect (meaning this test can be used) for the duration of the COVID-19 declaration under Section 564(b)(1) of the Act, 21 U.S.C. section 360bbb-3(b)(1), unless the authorization is terminated or revoked sooner. Performed at [facility], [address] [place], [place] [address]   Urine culture     Status: None   Collection Time: 08/31/18  4:30 PM   Specimen: Urine, Clean Catch  Result Value Ref Range Status   Specimen Description   Final    URINE, CLEAN CATCH Performed at [facility], [address] [place], [place] [address]    Special Requests   Final    NONE Performed at [facility], [address], [place], [place] [address]    Culture   Final    Multiple bacterial morphotypes present, none predominant. Suggest appropriate recollection if clinically indicated.   Report Status 09/02/2018 FINAL  Final      Radiology Studies: No results found.    LOS: 5 days   Phillips Climes MD  [facility] Pager on [email or web address]  09/06/2018, 10:40 AM

## 2018-09-06 NOTE — NC FL2 (Signed)
[place] LEVEL OF CARE SCREENING TOOL     IDENTIFICATION  Patient Name: Theresa Lynch Birthdate: 07/14/1929 Sex: female Admission Date (Current Location): 09/01/2018  [address] [facility] and [place] Number:  Herbalist and Address:  ([facility]. [place], [place])      Provider Number: [id number]  Attending Physician Name and Address:  Albertine Patricia, MD  Relative Name and Phone Number:  Max Sane [phone] daughter    Current Level of Care: Hospital Recommended Level of Care: [place] Prior Approval Number:    Date Approved/Denied:   PASRR Number: [id number]  Discharge Plan: SNF    Current Diagnoses: Patient Active Problem List   Diagnosis Date Noted  . AKI (acute kidney injury) ([place])   . UGIB (upper gastrointestinal bleed) 09/01/2018  . COVID-19 virus infection 09/01/2018  . PAF (paroxysmal atrial fibrillation) ([place]) 09/01/2018  . Pneumonia 01/13/2018  . Essential hypertension 12/07/2017  . Pure hypercholesterolemia 12/07/2017  . Acquired hypothyroidism 11/20/2017  . DNR (do not resuscitate) 10/15/2017  . Multiple myeloma in relapse ([place]) 06/15/2017  . Age-related osteoporosis with current pathological fracture with routine healing 06/01/2017  . Hip fracture ([place]) 05/25/2017  . Stage 3 chronic kidney disease ([place]) 08/21/2016  . Borderline diabetes mellitus 04/13/2015  . Macrocytic anemia 04/13/2015  . Vaccine counseling 04/12/2015  . Benign paroxysmal positional vertigo 09/27/2012  . Leucocytosis 08/23/2012  . TBI (traumatic brain injury) ([place]) 08/21/2012  . Hypokalemia 08/21/2012  . Hyponatremia 08/21/2012  . Syncope 08/21/2012  . Chest pain 08/21/2012  . Fall 08/18/2012  . Acute respiratory failure ([place]) 08/18/2012  . Acute blood loss anemia 08/18/2012  . Intracerebral hemorrhage ([place]) 08/15/2012  . Subarachnoid hemorrhage ([place]) 08/15/2012  . Closed basilar skull fracture with  subarachnoid hemorrhage ([place]) 08/15/2012  . Closed fracture of facial bones ([place]) 08/15/2012  . History of intracranial hemorrhage 07/23/2012    Orientation RESPIRATION BLADDER Height & Weight     Self, Time, Situation, Place  Normal Continent(occasional incontience, not frequent) Weight: 64 kg Height:  '5\' 6"'  (167.6 cm)  BEHAVIORAL SYMPTOMS/MOOD NEUROLOGICAL BOWEL NUTRITION STATUS      Continent Diet  AMBULATORY STATUS COMMUNICATION OF NEEDS Skin   Limited Assist Verbally Normal                       Personal Care Assistance Level of Assistance  Bathing, Dressing Bathing Assistance: Limited assistance   Dressing Assistance: Limited assistance     Functional Limitations Info  Hearing   Hearing Info: Impaired      SPECIAL CARE FACTORS FREQUENCY  PT (By licensed PT), OT (By licensed OT)     PT Frequency: 5x/week OT Frequency: min 3x/week            Contractures Contractures Info: Not present    Additional Factors Info  Code Status Code Status Info: DNR             Current Medications (09/06/2018):  This is the current hospital active medication list Current Facility-Administered Medications  Medication Dose Route Frequency Provider Last Rate Last Dose  . 0.9 %  sodium chloride infusion   Intravenous Continuous Elgergawy, Silver Huguenin, MD   Stopped at 09/05/18 1359  . acetaminophen (TYLENOL) tablet 650 mg  650 mg Oral Q6H PRN Etta Quill, DO   650 mg at 09/05/18 2020  . atorvastatin (LIPITOR) tablet 5 mg  5 mg Oral Daily Etta Quill, DO   5  mg at 09/06/18 0949  . calcium carbonate (OS-CAL - dosed in mg of elemental calcium) tablet 500 mg of elemental calcium  1 tablet Oral BID WC Bonnielee Haff, MD   500 mg of elemental calcium at 09/06/18 0800  . cholecalciferol (VITAMIN D3) tablet 2,000 Units  2,000 Units Oral BID Etta Quill, DO   2,000 Units at 09/06/18 7681  . dexamethasone (DECADRON) injection 6 mg  6 mg Intravenous Q24H Jennette Kettle M,  DO   6 mg at 09/06/18 0950  . feeding supplement (ENSURE ENLIVE) (ENSURE ENLIVE) liquid 237 mL  237 mL Oral BID BM Elgergawy, Silver Huguenin, MD      . guaiFENesin-dextromethorphan (ROBITUSSIN DM) 100-10 MG/5ML syrup 10 mL  10 mL Oral Q4H PRN Etta Quill, DO      . heparin injection 5,000 Units  5,000 Units Subcutaneous Q8H Elgergawy, Silver Huguenin, MD   5,000 Units at 09/06/18 0759  . levothyroxine (SYNTHROID) tablet 50 mcg  50 mcg Oral Daily Etta Quill, DO   50 mcg at 09/06/18 1572  . metoprolol tartrate (LOPRESSOR) tablet 25 mg  25 mg Oral BID Bonnielee Haff, MD   25 mg at 09/06/18 0949  . ondansetron (ZOFRAN) tablet 4 mg  4 mg Oral Q6H PRN Etta Quill, DO       Or  . ondansetron [facility]) injection 4 mg  4 mg Intravenous Q6H PRN Etta Quill, DO      . pantoprazole (PROTONIX) EC tablet 40 mg  40 mg Oral BID Jennette Kettle M, DO   40 mg at 09/06/18 0949  . PARoxetine (PAXIL) tablet 40 mg  40 mg Oral QHS Jennette Kettle M, DO   40 mg at 09/05/18 2021  . traMADol (ULTRAM) tablet 50 mg  50 mg Oral Q6H PRN Etta Quill, DO      . traZODone (DESYREL) tablet 25 mg  25 mg Oral QHS Jennette Kettle M, DO   25 mg at 09/05/18 2021     Discharge Medications: Please see discharge summary for a list of discharge medications.  Relevant Imaging Results:  Relevant Lab Results:   Additional Information [phone]  Ninfa Meeker, RN

## 2018-09-07 LAB — CBC
HCT: 28.9 % — ABNORMAL LOW (ref 36.0–46.0)
Hemoglobin: 8.6 g/dL — ABNORMAL LOW (ref 12.0–15.0)
MCH: 25.1 pg — ABNORMAL LOW (ref 26.0–34.0)
MCHC: 29.8 g/dL — ABNORMAL LOW (ref 30.0–36.0)
MCV: 84.5 fL (ref 80.0–100.0)
Platelets: 455 10*3/uL — ABNORMAL HIGH (ref 150–400)
RBC: 3.42 MIL/uL — ABNORMAL LOW (ref 3.87–5.11)
RDW: 18.6 % — ABNORMAL HIGH (ref 11.5–15.5)
WBC: 12.1 10*3/uL — ABNORMAL HIGH (ref 4.0–10.5)
nRBC: 0 % (ref 0.0–0.2)

## 2018-09-07 LAB — BASIC METABOLIC PANEL
Anion gap: 9 (ref 5–15)
BUN: 55 mg/dL — ABNORMAL HIGH (ref 8–23)
CO2: 20 mmol/L — ABNORMAL LOW (ref 22–32)
Calcium: 8 mg/dL — ABNORMAL LOW (ref 8.9–10.3)
Chloride: 105 mmol/L (ref 98–111)
Creatinine, Ser: 1.4 mg/dL — ABNORMAL HIGH (ref 0.44–1.00)
GFR calc Af Amer: 39 mL/min — ABNORMAL LOW (ref >60)
GFR calc non Af Amer: 34 mL/min — ABNORMAL LOW (ref >60)
Glucose, Bld: 106 mg/dL — ABNORMAL HIGH (ref 70–99)
Potassium: 5 mmol/L (ref 3.5–5.1)
Sodium: 134 mmol/L — ABNORMAL LOW (ref 135–145)

## 2018-09-07 NOTE — Progress Notes (Signed)
PROGRESS NOTE  Theresa Lynch KCM:[id number] DOB: 09/23/1929 DOA: 09/01/2018  PCP: [name], MD  Brief History/Interval Summary: 85 y.o. female with medical history significant of MM.  Patient presents to the ED with c/o N/V for the past 1 day.  Emesis was dark / coffee ground in color and concerning for hematemesis. She has also had  diarrhea for the past 1 week.  Also had productive cough for the past 10 days or more for which her oncologist stopped her revlimid on 7/31.  She was found to be positive for COVID-19.  She was also noted to have a lower hemoglobin than her baseline.  She went into atrial fibrillation with mild RVR while she was in the ED.  She was hospitalized for further management.    Subjective/Interval History:  Patient denies any complaints today, no chest pain, no nausea, no vomiting,   Antibiotics: Anti-infectives (From admission, onward)   Start     Dose/Rate Route Frequency Ordered Stop   09/02/18 1400  remdesivir 100 mg in sodium chloride 0.9 % 250 mL IVPB     100 mg 500 mL/hr over 30 Minutes Intravenous Every 24 hours 09/01/18 1232 09/05/18 1429   09/01/18 1400  remdesivir 200 mg in sodium chloride 0.9 % 250 mL IVPB     200 mg 500 mL/hr over 30 Minutes Intravenous Once 09/01/18 1232 09/01/18 1900          Assessment/Plan:  Acute Hypoxic Resp. Failure due to Acute Covid 19 Viral Illness  COVID-19 Labs  Recent Labs    09/05/18 0130 09/06/18 0236  DDIMER 2.56* 2.54*  CRP 1.4* 1.1*    Lab Results  Component Value Date   SARSCOV2NAA POSITIVE (A) 08/31/2018     Fever: Remains afebrile Oxygen requirements: She is currently on room air Antibacterials: None Remdesivir: Last day 09/05/2018 Steroids: Dexamethasone 6 mg daily Diuretics: None Actemra: No indication as yet DVT Prophylaxis: SCDs/subcu heparin  Remains on room air, with saturation in the mid 90s , continue with steroids, finished her IV Remdesivir course. -D-Dimer is  elevated, but trending down, on DVT prophylaxis subcu heparin started yesterday.  COVID-19 Labs  Recent Labs    09/05/18 0130 09/06/18 0236  DDIMER 2.56* 2.54*  CRP 1.4* 1.1*    Lab Results  Component Value Date   SARSCOV2NAA POSITIVE (A) 08/31/2018     Suspected upper GI bleed Patient apparently had coffee-ground emesis.  Hemoglobin was noted to be lower than her baseline.  Has not had any episodes of GI bleed in the hospital.  Hemoglobin is low but stable for the most part.  Continue with PPI.  CT scan of the abdomen and pelvis did not show any acute findings.  Continue to hold on antiplatelet agents.  Hemoglobin  remained stable, started on subcu heparin for DVT prophylaxis.  Atrial fibrillation with mild RVR This was noted in the emergency department.  Patient started on beta-blocker.  Dose was adjusted yesterday.  Heart rate seems to be better controlled.  TSH 0.753.  Free T4 noted to be mildly elevated at 1.56.  Significance of this is not entirely clear in the setting of acute illness.  We will recommend rechecking thyroid function test in a few weeks time.  Not a candidate for full dose anticoagulation due to suspected GI bleed as well as her other comorbidities and poor long-term prognosis as a result of her comorbidities.  Daughter agrees with this.  We will also hold off on echocardiogram as it  will not change management.  Acute blood loss anemia Baseline hemoglobin is between 10 and 11.  Noted to be 8.3 at admission.  Hemoglobin has been stable the last few days.  Continue to monitor.  AKI -Creatinine increased to 1.68 today, as well BUN rising, most likely volume depletion, improving with IV fluids, continue with normal saline 50 cc/h and monitor closely for signs of volume overload.  History of multiple myeloma Patient was on Revlimid recently.  Discontinued on July 31.  According to notes in EMR patient has been transition to palliative care.  She has stopped active  medical treatment for her cancer.  Previous MD Discussed with her oncologist, Dr. Rogue Bussing,   Apparently her multiple myeloma is mostly in the background.  Will not affect prognosis in this acute setting.  Hypokalemia Potassium level has improved.  Magnesium 2.0.    Essential hypertension Blood pressure reasonably well controlled.  Amlodipine on hold as the patient was started on metoprolol.   DVT Prophylaxis: SCDs, subcu heparin PUD Prophylaxis: Protonix Code Status: DNR Family Communication: D/W daughter via phone 8/14 Disposition Plan: Will need SNF placement Consultants: Phone discussion with gastroenterology by admitting provider  Procedures: None   Medications:  Scheduled:  atorvastatin  5 mg Oral Daily   calcium carbonate  1 tablet Oral BID WC   cholecalciferol  2,000 Units Oral BID   dexamethasone (DECADRON) injection  6 mg Intravenous Q24H   feeding supplement (ENSURE ENLIVE)  237 mL Oral BID BM   heparin injection (subcutaneous)  5,000 Units Subcutaneous Q8H   levothyroxine  50 mcg Oral Daily   metoprolol tartrate  25 mg Oral BID   pantoprazole  40 mg Oral BID   PARoxetine  40 mg Oral QHS   traZODone  25 mg Oral QHS   Continuous:  sodium chloride Stopped (09/05/18 1359)   HLK:[id number], guaiFENesin-dextromethorphan, ondansetron **OR** ondansetron (ZOFRAN) IV, traMADol   Objective:  Vital Signs  Vitals:   09/06/18 1704 09/06/18 2000 09/07/18 0445 09/07/18 1027  BP: 124/75 109/73 107/60 132/74  Pulse: 86 89 77 82  Resp: _0 Temp: 98 F (36.7 C) 98.4 F (36.9 C) 98.2 F (36.8 C) 98.3 F (36.8 C)  TempSrc: Oral Oral Oral   SpO2: 99% 96% 95%   Weight:      Height:        Intake/Output Summary (Last 24 hours) at 09/07/2018 1258 Last data filed at 09/07/2018 1100 Gross per 24 hour  Intake 360 ml  Output --  Net 360 ml   Filed Weights   09/04/18 0103  Weight: 64 kg    Awake Alert, Oriented X 3, No new F.N deficits,  Normal affect, frail Symmetrical Chest wall movement, Good air movement bilaterally, CTAB RRR,No Gallops,Rubs or new Murmurs, No Parasternal Heave +ve B.Sounds, Abd Soft, No tenderness, No rebound - guarding or rigidity. No Cyanosis, Clubbing or edema, No new Rash or bruise        Lab Results:  Data Reviewed: I have personally reviewed following labs and imaging studies  CBC: Recent Labs  Lab 09/02/18 0220 09/03/18 0242 09/04/18 0240 09/05/18 0130 09/06/18 0236 09/07/18 0035  WBC 3.9* 5.0 9.1 9.9 11.9* 12.1*  NEUTROABS 2.7 4.2 8.1* 8.6* 10.4*  --   HGB 8.0* 8.3* 8.2* 8.4* 8.3* 8.6*  HCT 27.5* 28.5* 27.9* 29.1* 28.5* 28.9*  MCV 86.5 85.6 85.1 86.4 84.6 84.5  PLT 469* 476* 453* 464* 431* 455*    Basic Metabolic Panel: Recent Labs  Lab 08/31/18 2051  09/03/18 0242 09/04/18 0240 09/05/18 0130 09/06/18 0236 09/07/18 0035  NA  --    < > 135 137 138 135 134*  K  --    < > 4.6 4.9 5.4* 4.8 5.0  CL  --    < > 104 109 105 106 105  CO2  --    < > 20* 18* 18* 17* 20*  GLUCOSE  --    < > 140* 140* 104* 128* 106*  BUN  --    < > 27* 50* 62* 61* 55*  CREATININE  --    < > 1.07* 1.49* 1.68* 1.51* 1.40*  CALCIUM  --    < > 7.2* 7.5* 7.9* 8.2* 8.0*  MG 1.9  --  2.0  --   --   --   --    < > = values in this interval not displayed.    GFR: Estimated Creatinine Clearance: 26.5 mL/min (A) (by C-G formula based on SCr of 1.4 mg/dL (H)).  Liver Function Tests: Recent Labs  Lab 09/02/18 0220 09/03/18 0242 09/04/18 0240 09/05/18 0130 09/06/18 0236  AST 19 14* _0 ALT _1 ALKPHOS 46 49 52 59 60  BILITOT 0.7 0.7 0.5 0.4 0.6  PROT 5.9* 6.2* 5.8* 6.0* 5.8*  ALBUMIN 2.5* 2.4* 2.3* 2.5* 2.4*    No results for input(s): LIPASE, AMYLASE in the last 168 hours.  Coagulation Profile: Recent Labs  Lab 08/31/18 1542  INR 1.3*      Recent Results (from the past 240 hour(s))  SARS Coronavirus 2 [facility] order, Performed in [facility] hospital lab)  Nasopharyngeal Nasopharyngeal Swab     Status: Abnormal   Collection Time: 08/31/18  3:40 PM   Specimen: Nasopharyngeal Swab  Result Value Ref Range Status   SARS Coronavirus 2 POSITIVE (A) NEGATIVE Final    Comment: RESULT CALLED TO, READ BACK BY AND VERIFIED WITH: STEPHEN JONES _2  ON 08/31/2018 BY FMW (NOTE) If result is NEGATIVE SARS-CoV-2 target nucleic acids are NOT DETECTED. The SARS-CoV-2 RNA is generally detectable in upper and lower  respiratory specimens during the acute phase of infection. The lowest  concentration of SARS-CoV-2 viral copies this assay can detect is 250  copies / mL. A negative result does not preclude SARS-CoV-2 infection  and should not be used as the sole basis for treatment or other  patient management decisions.  A negative result may occur with  improper specimen collection / handling, submission of specimen other  than nasopharyngeal swab, presence of viral mutation(s) within the  areas targeted by this assay, and inadequate number of viral copies  (<250 copies / mL). A negative result must be combined with clinical  observations, patient history, and epidemiological information. If result is POSITIVE SARS-CoV-2 target nucleic acids are DETECTE D. The SARS-CoV-2 RNA is generally detectable in upper and lower  respiratory specimens during the acute phase of infection.  Positive  results are indicative of active infection with SARS-CoV-2.  Clinical  correlation with patient history and other diagnostic information is  necessary to determine patient infection status.  Positive results do  not rule out bacterial infection or co-infection with other viruses. If result is PRESUMPTIVE POSTIVE SARS-CoV-2 nucleic acids MAY BE PRESENT.   A presumptive positive result was obtained on the submitted specimen  and confirmed on repeat testing.  While 2019 novel coronavirus  (SARS-CoV-2) nucleic acids may be present in the submitted sample  additional  confirmatory testing may be necessary for epidemiological  and / or clinical management purposes  to differentiate between  SARS-CoV-2 and other Sarbecovirus currently known to infect humans.  If clinically indicated additional testing with an alternate test  methodology (LAB745 3) is advised. The SARS-CoV-2 RNA is generally  detectable in upper and lower respiratory specimens during the acute  phase of infection. The expected result is Negative. Fact Sheet for Patients:  StrictlyIdeas.no Fact Sheet for Healthcare Providers: BankingDealers.co.za This test is not yet approved or cleared by the [place] FDA and has been authorized for detection and/or diagnosis of SARS-CoV-2 by FDA under an Emergency Use Authorization (EUA).  This EUA will remain in effect (meaning this test can be used) for the duration of the COVID-19 declaration under Section 564(b)(1) of the Act, 21 U.S.C. section 360bbb-3(b)(1), unless the authorization is terminated or revoked sooner. Performed at [facility], [address], [place], [place] [address]   Urine culture     Status: None   Collection Time: 08/31/18  4:30 PM   Specimen: Urine, Clean Catch  Result Value Ref Range Status   Specimen Description   Final    URINE, CLEAN CATCH Performed at [facility], [address], [place], [place] [address]    Special Requests   Final    NONE Performed at [facility], [address] [place], [place] [address]    Culture   Final    Multiple bacterial morphotypes present, none predominant. Suggest appropriate recollection if clinically indicated.   Report Status 09/02/2018 FINAL  Final      Radiology Studies: No results found.    LOS: 6 days   Phillips Climes MD  [facility] Pager on [email or web address]  09/07/2018, 12:58 PM

## 2018-09-08 LAB — CBC
HCT: 28.1 % — ABNORMAL LOW (ref 36.0–46.0)
Hemoglobin: 8.4 g/dL — ABNORMAL LOW (ref 12.0–15.0)
MCH: 24.6 pg — ABNORMAL LOW (ref 26.0–34.0)
MCHC: 29.9 g/dL — ABNORMAL LOW (ref 30.0–36.0)
MCV: 82.2 fL (ref 80.0–100.0)
Platelets: 480 10*3/uL — ABNORMAL HIGH (ref 150–400)
RBC: 3.42 MIL/uL — ABNORMAL LOW (ref 3.87–5.11)
RDW: 18.3 % — ABNORMAL HIGH (ref 11.5–15.5)
WBC: 9.6 10*3/uL (ref 4.0–10.5)
nRBC: 0.2 % (ref 0.0–0.2)

## 2018-09-08 LAB — BASIC METABOLIC PANEL
Anion gap: 7 (ref 5–15)
BUN: 48 mg/dL — ABNORMAL HIGH (ref 8–23)
CO2: 24 mmol/L (ref 22–32)
Calcium: 8.3 mg/dL — ABNORMAL LOW (ref 8.9–10.3)
Chloride: 106 mmol/L (ref 98–111)
Creatinine, Ser: 1.18 mg/dL — ABNORMAL HIGH (ref 0.44–1.00)
GFR calc Af Amer: 48 mL/min — ABNORMAL LOW (ref >60)
GFR calc non Af Amer: 41 mL/min — ABNORMAL LOW (ref >60)
Glucose, Bld: 117 mg/dL — ABNORMAL HIGH (ref 70–99)
Potassium: 4.7 mmol/L (ref 3.5–5.1)
Sodium: 137 mmol/L (ref 135–145)

## 2018-09-08 NOTE — Progress Notes (Signed)
PROGRESS NOTE  Theresa Lynch [id number] DOB: September 18, 1929 DOA: 09/01/2018  PCP: [name], MD  Brief History/Interval Summary: 85 y.o. female with medical history significant of MM.  Patient presents to the ED with c/o N/V for the past 1 day.  Emesis was dark / coffee ground in color and concerning for hematemesis. She has also had  diarrhea for the past 1 week.  Also had productive cough for the past 10 days or more for which her oncologist stopped her revlimid on 7/31.  She was found to be positive for COVID-19.  She was also noted to have a lower hemoglobin than her baseline.  She went into atrial fibrillation with mild RVR while she was in the ED.  She was hospitalized for further management.    Subjective/Interval History:  Patient denies any complaints today, no chest pain, no nausea, no vomiting,   Antibiotics: Anti-infectives (From admission, onward)   Start     Dose/Rate Route Frequency Ordered Stop   09/02/18 1400  remdesivir 100 mg in sodium chloride 0.9 % 250 mL IVPB     100 mg 500 mL/hr over 30 Minutes Intravenous Every 24 hours 09/01/18 1232 09/05/18 1429   09/01/18 1400  remdesivir 200 mg in sodium chloride 0.9 % 250 mL IVPB     200 mg 500 mL/hr over 30 Minutes Intravenous Once 09/01/18 1232 09/01/18 1900          Assessment/Plan:  Acute Hypoxic Resp. Failure due to Acute Covid 19 Viral Illness  COVID-19 Labs  Recent Labs    09/06/18 0236  DDIMER 2.54*  CRP 1.1*    Lab Results  Component Value Date   SARSCOV2NAA POSITIVE (A) 08/31/2018     Fever: Remains afebrile Oxygen requirements: She is currently on room air Antibacterials: None Remdesivir: Last day 09/05/2018 Steroids: Dexamethasone 6 mg daily Diuretics: None Actemra: No indication as yet DVT Prophylaxis: SCDs/subcu heparin  Remains on room air, with saturation in the mid 90s , continue with steroids, finished her IV Remdesivir course. -D-Dimer is elevated, but trending  down, on DVT prophylaxis subcu heparin started yesterday.  COVID-19 Labs  Recent Labs    09/06/18 0236  DDIMER 2.54*  CRP 1.1*    Lab Results  Component Value Date   SARSCOV2NAA POSITIVE (A) 08/31/2018     Suspected upper GI bleed Patient apparently had coffee-ground emesis.  Hemoglobin was noted to be lower than her baseline.  Has not had any episodes of GI bleed in the hospital.  Hemoglobin is low but stable for the most part.  Continue with PPI.  CT scan of the abdomen and pelvis did not show any acute findings.  Continue to hold on antiplatelet agents.  Hemoglobin  remained stable, started on subcu heparin for DVT prophylaxis.  Atrial fibrillation with mild RVR This was noted in the emergency department.  Patient started on beta-blocker.  Dose was adjusted yesterday.  Heart rate seems to be better controlled.  TSH 0.753.  Free T4 noted to be mildly elevated at 1.56.  Significance of this is not entirely clear in the setting of acute illness.  We will recommend rechecking thyroid function test in a few weeks time.  Not a candidate for full dose anticoagulation due to suspected GI bleed as well as her other comorbidities and poor long-term prognosis as a result of her comorbidities.  Daughter agrees with this.  We will also hold off on echocardiogram as it will not change management.  Acute blood loss  anemia Baseline hemoglobin is between 10 and 11.  Noted to be 8.3 at admission.  Hemoglobin has been stable the last few days.  Continue to monitor.  AKI -Creatinine increased to 1.68 today, as well BUN rising, most likely volume depletion, currently improved with IV fluids, it is 1.1 today, will discontinue IV fluids to avoid volume overload.  History of multiple myeloma Patient was on Revlimid recently.  Discontinued on July 31.  According to notes in EMR patient has been transition to palliative care.  She has stopped active medical treatment for her cancer.  Previous MD Discussed  with her oncologist, Dr. Rogue Bussing,   Apparently her multiple myeloma is mostly in the background.  Will not affect prognosis in this acute setting.  Hypokalemia Potassium level has improved.  Magnesium 2.0.    Essential hypertension Blood pressure reasonably well controlled.  Amlodipine on hold as the patient was started on metoprolol.   DVT Prophylaxis: SCDs, subcu heparin PUD Prophylaxis: Protonix Code Status: DNR Family Communication: D/W daughter via phone 8/14 Disposition Plan: Will need SNF placement, awaiting bed availability  Consultants: Phone discussion with gastroenterology by admitting provider  Procedures: None   Medications:  Scheduled:  atorvastatin  5 mg Oral Daily   calcium carbonate  1 tablet Oral BID WC   cholecalciferol  2,000 Units Oral BID   dexamethasone (DECADRON) injection  6 mg Intravenous Q24H   feeding supplement (ENSURE ENLIVE)  237 mL Oral BID BM   heparin injection (subcutaneous)  5,000 Units Subcutaneous Q8H   levothyroxine  50 mcg Oral Daily   metoprolol tartrate  25 mg Oral BID   pantoprazole  40 mg Oral BID   PARoxetine  40 mg Oral QHS   traZODone  25 mg Oral QHS   Continuous:  TLX:[id number], guaiFENesin-dextromethorphan, ondansetron **OR** ondansetron (ZOFRAN) IV, traMADol   Objective:  Vital Signs  Vitals:   09/07/18 1555 09/07/18 2013 09/08/18 0350 09/08/18 0800  BP: 119/78 116/70 128/68 (!) 135/93  Pulse: 89 87 74 82  Resp: 19 (!) '21 12 15  ' Temp: 98 F (36.7 C) 98.2 F (36.8 C) 98 F (36.7 C) 97.8 F (36.6 C)  TempSrc: Oral Oral Oral Oral  SpO2: 95% 91% 91% 94%  Weight:      Height:        Intake/Output Summary (Last 24 hours) at 09/08/2018 1033 Last data filed at 09/07/2018 1100 Gross per 24 hour  Intake 240 ml  Output --  Net 240 ml   Filed Weights   09/04/18 0103  Weight: 64 kg    Awake Alert, Oriented X 3, No new F.N deficits, Normal affect Symmetrical Chest wall movement, Good air  movement bilaterally, CTAB RRR,No Gallops,Rubs or new Murmurs, No Parasternal Heave +ve B.Sounds, Abd Soft, No tenderness, No rebound - guarding or rigidity. No Cyanosis, Clubbing or edema, No new Rash or bruise       Lab Results:  Data Reviewed: I have personally reviewed following labs and imaging studies  CBC: Recent Labs  Lab 09/02/18 0220 09/03/18 0242 09/04/18 0240 09/05/18 0130 09/06/18 0236 09/07/18 0035 09/08/18 0241  WBC 3.9* 5.0 9.1 9.9 11.9* 12.1* 9.6  NEUTROABS 2.7 4.2 8.1* 8.6* 10.4*  --   --   HGB 8.0* 8.3* 8.2* 8.4* 8.3* 8.6* 8.4*  HCT 27.5* 28.5* 27.9* 29.1* 28.5* 28.9* 28.1*  MCV 86.5 85.6 85.1 86.4 84.6 84.5 82.2  PLT 469* 476* 453* 464* 431* 455* 480*    Basic Metabolic Panel: Recent Labs  Lab  09/03/18 0242 09/04/18 0240 09/05/18 0130 09/06/18 0236 09/07/18 0035 09/08/18 0241  NA 135 137 138 135 134* 137  K 4.6 4.9 5.4* 4.8 5.0 4.7  CL 104 109 105 106 105 106  CO2 20* 18* 18* 17* 20* 24  GLUCOSE 140* 140* 104* 128* 106* 117*  BUN 27* 50* 62* 61* 55* 48*  CREATININE 1.07* 1.49* 1.68* 1.51* 1.40* 1.18*  CALCIUM 7.2* 7.5* 7.9* 8.2* 8.0* 8.3*  MG 2.0  --   --   --   --   --     GFR: Estimated Creatinine Clearance: 31.4 mL/min (A) (by C-G formula based on SCr of 1.18 mg/dL (H)).  Liver Function Tests: Recent Labs  Lab 09/02/18 0220 09/03/18 0242 09/04/18 0240 09/05/18 0130 09/06/18 0236  AST 19 14* '19 21 15  ' ALT '11 11 13 18 16  ' ALKPHOS 46 49 52 59 60  BILITOT 0.7 0.7 0.5 0.4 0.6  PROT 5.9* 6.2* 5.8* 6.0* 5.8*  ALBUMIN 2.5* 2.4* 2.3* 2.5* 2.4*    No results for input(s): LIPASE, AMYLASE in the last 168 hours.  Coagulation Profile: No results for input(s): INR, PROTIME in the last 168 hours.    Recent Results (from the past 240 hour(s))  SARS Coronavirus 2 [facility] order, Performed in [facility] hospital lab) Nasopharyngeal Nasopharyngeal Swab     Status: Abnormal   Collection Time: 08/31/18  3:40 PM   Specimen:  Nasopharyngeal Swab  Result Value Ref Range Status   SARS Coronavirus 2 POSITIVE (A) NEGATIVE Final    Comment: RESULT CALLED TO, READ BACK BY AND VERIFIED WITH: STEPHEN JONES '@1827'  ON 08/31/2018 BY FMW (NOTE) If result is NEGATIVE SARS-CoV-2 target nucleic acids are NOT DETECTED. The SARS-CoV-2 RNA is generally detectable in upper and lower  respiratory specimens during the acute phase of infection. The lowest  concentration of SARS-CoV-2 viral copies this assay can detect is 250  copies / mL. A negative result does not preclude SARS-CoV-2 infection  and should not be used as the sole basis for treatment or other  patient management decisions.  A negative result may occur with  improper specimen collection / handling, submission of specimen other  than nasopharyngeal swab, presence of viral mutation(s) within the  areas targeted by this assay, and inadequate number of viral copies  (<250 copies / mL). A negative result must be combined with clinical  observations, patient history, and epidemiological information. If result is POSITIVE SARS-CoV-2 target nucleic acids are DETECTE D. The SARS-CoV-2 RNA is generally detectable in upper and lower  respiratory specimens during the acute phase of infection.  Positive  results are indicative of active infection with SARS-CoV-2.  Clinical  correlation with patient history and other diagnostic information is  necessary to determine patient infection status.  Positive results do  not rule out bacterial infection or co-infection with other viruses. If result is PRESUMPTIVE POSTIVE SARS-CoV-2 nucleic acids MAY BE PRESENT.   A presumptive positive result was obtained on the submitted specimen  and confirmed on repeat testing.  While 2019 novel coronavirus  (SARS-CoV-2) nucleic acids may be present in the submitted sample  additional confirmatory testing may be necessary for epidemiological  and / or clinical management purposes  to differentiate  between  SARS-CoV-2 and other Sarbecovirus currently known to infect humans.  If clinically indicated additional testing with an alternate test  methodology (LAB745 3) is advised. The SARS-CoV-2 RNA is generally  detectable in upper and lower respiratory specimens during the acute  phase of infection. The expected result is Negative. Fact Sheet for Patients:  StrictlyIdeas.no Fact Sheet for Healthcare Providers: BankingDealers.co.za This test is not yet approved or cleared by the [place] FDA and has been authorized for detection and/or diagnosis of SARS-CoV-2 by FDA under an Emergency Use Authorization (EUA).  This EUA will remain in effect (meaning this test can be used) for the duration of the COVID-19 declaration under Section 564(b)(1) of the Act, 21 U.S.C. section 360bbb-3(b)(1), unless the authorization is terminated or revoked sooner. Performed at [facility], [address] [place], [place] [address]   Urine culture     Status: None   Collection Time: 08/31/18  4:30 PM   Specimen: Urine, Clean Catch  Result Value Ref Range Status   Specimen Description   Final    URINE, CLEAN CATCH Performed at [facility], [address] [place], [place] [address]    Special Requests   Final    NONE Performed at [facility], [address] [place], [place] [address]    Culture   Final    Multiple bacterial morphotypes present, none predominant. Suggest appropriate recollection if clinically indicated.   Report Status 09/02/2018 FINAL  Final      Radiology Studies: No results found.    LOS: 7 days   Phillips Climes MD  [facility] Pager on [email or web address]  09/08/2018, 10:33 AM

## 2018-09-08 NOTE — Progress Notes (Signed)
Patient has been up to chair last two days and has tolerated very well. Patient seems to do better with eating in chair. No acute distress noted. Call bell in reach.

## 2018-09-08 NOTE — Plan of Care (Signed)
Will continue with plan of care. 

## 2018-09-09 NOTE — Plan of Care (Signed)
  Problem: Respiratory: Goal: Complications related to the disease process, condition or treatment will be avoided or minimized Outcome: Progressing Note: Pt is able to maintain her sats on room air during my care.    Problem: Clinical Measurements: Goal: Will remain free from infection Outcome: Progressing   Problem: Activity: Goal: Risk for activity intolerance will decrease Outcome: Progressing Note: Pt was able to use BSC during my shift and walked around the bed to get to the chair. She was able to sit up during lunch. Will attempt to get her up again for dinner.    Problem: Elimination: Goal: Will not experience complications related to urinary retention Outcome: Progressing

## 2018-09-09 NOTE — Progress Notes (Signed)
Pt was placed in chair for dinner. Pt was assisted up from bed and used walker to get to [facility] and then chair. Pt continues to be incontinent. She states she does not have this issue at home, but can't verbalize why she has this issue now. Pt bathed and sat in chair.

## 2018-09-09 NOTE — Progress Notes (Signed)
PT Cancellation Note  Patient Details Name: Theresa Lynch MRN: [id number] DOB: March 09, 1929   Cancelled Treatment:    Reason Eval/Treat Not Completed: Patient declined, no reason specified, declined to get to [facility], sit up. Continue attempts for mobility.   Claretha Cooper 09/09/2018, 4:28 PM [place]  Office [phone]

## 2018-09-09 NOTE — Progress Notes (Signed)
PROGRESS NOTE  Theresa Lynch OXB:[id number] DOB: 1929/10/11 DOA: 09/01/2018  PCP: [name], MD  Brief History/Interval Summary:  85 y.o. female with medical history significant of MM.  Patient presents to the ED with c/o N/V for the past 1 day.  Emesis was dark / coffee ground in color and concerning for hematemesis. She has also had  diarrhea for the past 1 week.  Also had productive cough for the past 10 days or more for which her oncologist stopped her revlimid on 7/31.  She was found to be positive for COVID-19.  She was also noted to have a lower hemoglobin than her baseline.  She went into atrial fibrillation with mild RVR while she was in the ED.  She was hospitalized for further management.    Subjective/Interval History: Patient denies any complaints today, no chest pain, no nausea, no vomiting,   Antibiotics: Anti-infectives (From admission, onward)   Start     Dose/Rate Route Frequency Ordered Stop   09/02/18 1400  remdesivir 100 mg in sodium chloride 0.9 % 250 mL IVPB     100 mg 500 mL/hr over 30 Minutes Intravenous Every 24 hours 09/01/18 1232 09/05/18 1429   09/01/18 1400  remdesivir 200 mg in sodium chloride 0.9 % 250 mL IVPB     200 mg 500 mL/hr over 30 Minutes Intravenous Once 09/01/18 1232 09/01/18 1900          Assessment/Plan:  Acute Hypoxic Resp. Failure due to Acute Covid 19 Viral Illness  COVID-19 Labs  No results for input(s): DDIMER, FERRITIN, LDH, CRP in the last 72 hours.  Lab Results  Component Value Date   SARSCOV2NAA POSITIVE (A) 08/31/2018     Fever: Remains afebrile Oxygen requirements: She is currently on room air Antibacterials: None Remdesivir: Last day 09/05/2018 Steroids: Dexamethasone 6 mg daily Diuretics: None Actemra: No indication as yet DVT Prophylaxis: SCDs/subcu heparin  Remains on room air, with saturation in the mid 90s , continue with steroids, finished her IV Remdesivir course. -D-Dimer is elevated, but  trending down, on DVT prophylaxis subcu heparin started yesterday.  COVID-19 Labs  No results for input(s): DDIMER, FERRITIN, LDH, CRP in the last 72 hours.  Lab Results  Component Value Date   SARSCOV2NAA POSITIVE (A) 08/31/2018     Suspected upper GI bleed Patient apparently had coffee-ground emesis.  Hemoglobin was noted to be lower than her baseline.  Has not had any episodes of GI bleed in the hospital.  Hemoglobin is low but stable for the most part.  Continue with PPI.  CT scan of the abdomen and pelvis did not show any acute findings.  Continue to hold on antiplatelet agents.  Hemoglobin  remained stable, started on subcu heparin for DVT prophylaxis.  Atrial fibrillation with mild RVR This was noted in the emergency department.  Patient started on beta-blocker.  Dose was adjusted yesterday.  Heart rate seems to be better controlled.  TSH 0.753.  Free T4 noted to be mildly elevated at 1.56.  Significance of this is not entirely clear in the setting of acute illness.  We will recommend rechecking thyroid function test in a few weeks time.  Not a candidate for full dose anticoagulation due to suspected GI bleed as well as her other comorbidities and poor long-term prognosis as a result of her comorbidities.  Daughter agrees with this.  We will also hold off on echocardiogram as it will not change management.  Acute blood loss anemia Baseline hemoglobin is  between 10 and 11.  Noted to be 8.3 at admission.  Hemoglobin has been stable the last few days.  Continue to monitor.  AKI -Creatinine increased to 1.68 today, volume depletion, responded to IV fluids .  History of multiple myeloma Patient was on Revlimid recently.  Discontinued on July 31.  According to notes in EMR patient has been transition to palliative care.  She has stopped active medical treatment for her cancer.  Previous MD Discussed with her oncologist, Dr. Rogue Bussing,   Apparently her multiple myeloma is mostly in the  background.  Will not affect prognosis in this acute setting.  Hypokalemia Potassium level has improved.  Magnesium 2.0.    Essential hypertension Blood pressure reasonably well controlled.  Amlodipine on hold as the patient was started on metoprolol.   DVT Prophylaxis: SCDs, subcu heparin PUD Prophylaxis: Protonix Code Status: DNR Family Communication: D/W daughter via phone 8/14 Disposition Plan: Will need SNF placement, awaiting bed availability  Consultants: Phone discussion with gastroenterology by admitting provider  Procedures: None   Medications:  Scheduled: . atorvastatin  5 mg Oral Daily  . calcium carbonate  1 tablet Oral BID WC  . cholecalciferol  2,000 Units Oral BID  . dexamethasone (DECADRON) injection  6 mg Intravenous Q24H  . feeding supplement (ENSURE ENLIVE)  237 mL Oral BID BM  . heparin injection (subcutaneous)  5,000 Units Subcutaneous Q8H  . levothyroxine  50 mcg Oral Daily  . metoprolol tartrate  25 mg Oral BID  . pantoprazole  40 mg Oral BID  . PARoxetine  40 mg Oral QHS  . traZODone  25 mg Oral QHS   Continuous:  XTG:GYIRSWNIOEVOJ, guaiFENesin-dextromethorphan, ondansetron **OR** ondansetron (ZOFRAN) IV, traMADol   Objective:  Vital Signs  Vitals:   09/08/18 1738 09/08/18 2026 09/09/18 0357 09/09/18 0815  BP: 131/79 119/73  138/73  Pulse: 90 80  74  Resp: 20     Temp: 98.4 F (36.9 C) 98.5 F (36.9 C) 97.9 F (36.6 C) 98.4 F (36.9 C)  TempSrc: Oral Oral Oral Oral  SpO2: 97% 95%  95%  Weight:      Height:        Intake/Output Summary (Last 24 hours) at 09/09/2018 1144 Last data filed at 09/09/2018 0900 Gross per 24 hour  Intake 1000 ml  Output -  Net 1000 ml   Filed Weights   09/04/18 0103  Weight: 64 kg    Awake Alert, Oriented X 3, No new F.N deficits, Normal affect Symmetrical Chest wall movement, Good air movement bilaterally, CTAB RRR,No Gallops,Rubs or new Murmurs, No Parasternal Heave +ve B.Sounds, Abd Soft, No  tenderness, No rebound - guarding or rigidity. No Cyanosis, Clubbing or edema, No new Rash or bruise        Lab Results:  Data Reviewed: I have personally reviewed following labs and imaging studies  CBC: Recent Labs  Lab 09/03/18 0242 09/04/18 0240 09/05/18 0130 09/06/18 0236 09/07/18 0035 09/08/18 0241  WBC 5.0 9.1 9.9 11.9* 12.1* 9.6  NEUTROABS 4.2 8.1* 8.6* 10.4*  --   --   HGB 8.3* 8.2* 8.4* 8.3* 8.6* 8.4*  HCT 28.5* 27.9* 29.1* 28.5* 28.9* 28.1*  MCV 85.6 85.1 86.4 84.6 84.5 82.2  PLT 476* 453* 464* 431* 455* 480*    Basic Metabolic Panel: Recent Labs  Lab 09/03/18 0242 09/04/18 0240 09/05/18 0130 09/06/18 0236 09/07/18 0035 09/08/18 0241  NA 135 137 138 135 134* 137  K 4.6 4.9 5.4* 4.8 5.0 4.7  CL 104  109 105 106 105 106  CO2 20* 18* 18* 17* 20* 24  GLUCOSE 140* 140* 104* 128* 106* 117*  BUN 27* 50* 62* 61* 55* 48*  CREATININE 1.07* 1.49* 1.68* 1.51* 1.40* 1.18*  CALCIUM 7.2* 7.5* 7.9* 8.2* 8.0* 8.3*  MG 2.0  --   --   --   --   --     GFR: Estimated Creatinine Clearance: 31.4 mL/min (A) (by C-G formula based on SCr of 1.18 mg/dL (H)).  Liver Function Tests: Recent Labs  Lab 09/03/18 0242 09/04/18 0240 09/05/18 0130 09/06/18 0236  AST 14* '19 21 15  ' ALT '11 13 18 16  ' ALKPHOS 49 52 59 60  BILITOT 0.7 0.5 0.4 0.6  PROT 6.2* 5.8* 6.0* 5.8*  ALBUMIN 2.4* 2.3* 2.5* 2.4*    No results for input(s): LIPASE, AMYLASE in the last 168 hours.  Coagulation Profile: No results for input(s): INR, PROTIME in the last 168 hours.    Recent Results (from the past 240 hour(s))  SARS Coronavirus 2 [facility] order, Performed in [facility] hospital lab) Nasopharyngeal Nasopharyngeal Swab     Status: Abnormal   Collection Time: 08/31/18  3:40 PM   Specimen: Nasopharyngeal Swab  Result Value Ref Range Status   SARS Coronavirus 2 POSITIVE (A) NEGATIVE Final    Comment: RESULT CALLED TO, READ BACK BY AND VERIFIED WITH: STEPHEN JONES '@[id number]'  ON 08/31/2018 BY  [facility] (NOTE) If result is NEGATIVE SARS-CoV-2 target nucleic acids are NOT DETECTED. The SARS-CoV-2 RNA is generally detectable in upper and lower  respiratory specimens during the acute phase of infection. The lowest  concentration of SARS-CoV-2 viral copies this assay can detect is 250  copies / mL. A negative result does not preclude SARS-CoV-2 infection  and should not be used as the sole basis for treatment or other  patient management decisions.  A negative result may occur with  improper specimen collection / handling, submission of specimen other  than nasopharyngeal swab, presence of viral mutation(s) within the  areas targeted by this assay, and inadequate number of viral copies  (<250 copies / mL). A negative result must be combined with clinical  observations, patient history, and epidemiological information. If result is POSITIVE SARS-CoV-2 target nucleic acids are DETECTE D. The SARS-CoV-2 RNA is generally detectable in upper and lower  respiratory specimens during the acute phase of infection.  Positive  results are indicative of active infection with SARS-CoV-2.  Clinical  correlation with patient history and other diagnostic information is  necessary to determine patient infection status.  Positive results do  not rule out bacterial infection or co-infection with other viruses. If result is PRESUMPTIVE POSTIVE SARS-CoV-2 nucleic acids MAY BE PRESENT.   A presumptive positive result was obtained on the submitted specimen  and confirmed on repeat testing.  While 2019 novel coronavirus  (SARS-CoV-2) nucleic acids may be present in the submitted sample  additional confirmatory testing may be necessary for epidemiological  and / or clinical management purposes  to differentiate between  SARS-CoV-2 and other Sarbecovirus currently known to infect humans.  If clinically indicated additional testing with an alternate test  methodology (LAB745 3) is advised. The SARS-CoV-2 RNA  is generally  detectable in upper and lower respiratory specimens during the acute  phase of infection. The expected result is Negative. Fact Sheet for Patients:  StrictlyIdeas.no Fact Sheet for Healthcare Providers: BankingDealers.co.za This test is not yet approved or cleared by the [place] FDA and has been authorized for detection  and/or diagnosis of SARS-CoV-2 by FDA under an Emergency Use Authorization (EUA).  This EUA will remain in effect (meaning this test can be used) for the duration of the COVID-19 declaration under Section 564(b)(1) of the Act, 21 U.S.C. section 360bbb-3(b)(1), unless the authorization is terminated or revoked sooner. Performed at [facility], [address], [place], [place] [address]   Urine culture     Status: None   Collection Time: 08/31/18  4:30 PM   Specimen: Urine, Clean Catch  Result Value Ref Range Status   Specimen Description   Final    URINE, CLEAN CATCH Performed at [facility], [address], [place], [place] [address]    Special Requests   Final    NONE Performed at [facility], [address], [place], [place] [address]    Culture   Final    Multiple bacterial morphotypes present, none predominant. Suggest appropriate recollection if clinically indicated.   Report Status 09/02/2018 FINAL  Final      Radiology Studies: No results found.    LOS: 8 days   Phillips Climes MD  [facility] Pager on [email or web address]  09/09/2018, 11:44 AM

## 2018-09-10 LAB — BASIC METABOLIC PANEL
Anion gap: 9 (ref 5–15)
BUN: 45 mg/dL — ABNORMAL HIGH (ref 8–23)
CO2: 24 mmol/L (ref 22–32)
Calcium: 8.3 mg/dL — ABNORMAL LOW (ref 8.9–10.3)
Chloride: 105 mmol/L (ref 98–111)
Creatinine, Ser: 1.07 mg/dL — ABNORMAL HIGH (ref 0.44–1.00)
GFR calc Af Amer: 54 mL/min — ABNORMAL LOW (ref >60)
GFR calc non Af Amer: 47 mL/min — ABNORMAL LOW (ref >60)
Glucose, Bld: 80 mg/dL (ref 70–99)
Potassium: 4.9 mmol/L (ref 3.5–5.1)
Sodium: 138 mmol/L (ref 135–145)

## 2018-09-10 LAB — CBC
HCT: 27.3 % — ABNORMAL LOW (ref 36.0–46.0)
Hemoglobin: 8.2 g/dL — ABNORMAL LOW (ref 12.0–15.0)
MCH: 25.2 pg — ABNORMAL LOW (ref 26.0–34.0)
MCHC: 30 g/dL (ref 30.0–36.0)
MCV: 84 fL (ref 80.0–100.0)
Platelets: 502 10*3/uL — ABNORMAL HIGH (ref 150–400)
RBC: 3.25 MIL/uL — ABNORMAL LOW (ref 3.87–5.11)
RDW: 18.3 % — ABNORMAL HIGH (ref 11.5–15.5)
WBC: 11.8 10*3/uL — ABNORMAL HIGH (ref 4.0–10.5)
nRBC: 0 % (ref 0.0–0.2)

## 2018-09-10 NOTE — TOC Initial Note (Signed)
Transition of Care [facility]) - Initial/Assessment Note    Patient Details  Name: Theresa Lynch MRN: [id number] Date of Birth: 1929-02-28  [facility] CM/SW Contact:    Weston Anna, LCSW Phone Number: 09/10/2018, 9:44 AM  Clinical Narrative:                  CSW spoke with patients daughter, Faydra, to discuss discharge planning. PT is currently recommending SNF placement at this time- daughter is open to this and has no concerns. CSW informed daughter of limited SNF facilities accepting covid positive patients at this time and her current only bed offer is [facility]. Daughter voiced understanding and is agreeable to placement at [facility].   CSW started insurance authorization with [employer]- if authorization is received today and medically stable facility can accept.    Expected Discharge Plan: Skilled Nursing Facility Barriers to Discharge: No Barriers Identified   Patient Goals and CMS Choice Patient states their goals for this hospitalization and ongoing recovery are:: getting back home with family CMS Medicare.gov Compare Post Acute Care list provided to:: Patient Represenative (must comment)(spoke with daughter laverta) Choice offered to / list presented to : Adult Children  Expected Discharge Plan and Services Expected Discharge Plan: [place] In-house Referral: Clinical Social Work Discharge Planning Services: CM Consult Post Acute Care Choice: [place] arrangements for the past 2 months: Single Family Home                 DME Arranged: N/A         HH Arranged: NA          Prior Living Arrangements/Services Living arrangements for the past 2 months: Single Family Home Lives with:: Adult Children Patient language and need for interpreter reviewed:: Yes Do you feel safe going back to the place where you live?: No      Need for Family Participation in Patient Care: Yes (Comment) Care giver support system in  place?: Yes (comment)   Criminal Activity/Legal Involvement Pertinent to Current Situation/Hospitalization: No - Comment as needed  Activities of Daily Living Home Assistive Devices/Equipment: None ADL Screening (condition at time of admission) Patient's cognitive ability adequate to safely complete daily activities?: Yes Is the patient deaf or have difficulty hearing?: Yes Does the patient have difficulty seeing, even when wearing glasses/contacts?: No Does the patient have difficulty concentrating, remembering, or making decisions?: No Patient able to express need for assistance with ADLs?: Yes Does the patient have difficulty dressing or bathing?: No Independently performs ADLs?: Yes (appropriate for developmental age) Does the patient have difficulty walking or climbing stairs?: Yes Weakness of Legs: Both Weakness of Arms/Hands: Both  Permission Sought/Granted Permission sought to share information with : Case Manager          Permission granted to share info w Relationship: daughter  Permission granted to share info w Contact Information: Max Sane  [phone]  Emotional Assessment       Orientation: : Oriented to Self, Oriented to Place, Oriented to  Time, Oriented to Situation Alcohol / Substance Use: Not Applicable Psych Involvement: No (comment)  Admission diagnosis:  COVID 19 Patient Active Problem List   Diagnosis Date Noted  . AKI (acute kidney injury) ([place])   . UGIB (upper gastrointestinal bleed) 09/01/2018  . COVID-19 virus infection 09/01/2018  . PAF (paroxysmal atrial fibrillation) ([place]) 09/01/2018  . Pneumonia 01/13/2018  . Essential hypertension 12/07/2017  . Pure hypercholesterolemia 12/07/2017  . Acquired hypothyroidism  11/20/2017  . DNR (do not resuscitate) 10/15/2017  . Multiple myeloma in relapse ([place]) 06/15/2017  . Age-related osteoporosis with current pathological fracture with routine healing 06/01/2017  . Hip fracture ([place]) 05/25/2017  .  Stage 3 chronic kidney disease ([place]) 08/21/2016  . Borderline diabetes mellitus 04/13/2015  . Macrocytic anemia 04/13/2015  . Vaccine counseling 04/12/2015  . Benign paroxysmal positional vertigo 09/27/2012  . Leucocytosis 08/23/2012  . TBI (traumatic brain injury) ([place]) 08/21/2012  . Hypokalemia 08/21/2012  . Hyponatremia 08/21/2012  . Syncope 08/21/2012  . Chest pain 08/21/2012  . Fall 08/18/2012  . Acute respiratory failure ([place]) 08/18/2012  . Acute blood loss anemia 08/18/2012  . Intracerebral hemorrhage ([place]) 08/15/2012  . Subarachnoid hemorrhage ([place]) 08/15/2012  . Closed basilar skull fracture with subarachnoid hemorrhage ([place]) 08/15/2012  . Closed fracture of facial bones ([place]) 08/15/2012  . History of intracranial hemorrhage 07/23/2012   PCP:  [name], MD Pharmacy:   [facility] [place], [place] [facility] [place] [address] [place] [address] Phone: [phone] Fax: [phone]  [place], [place] - [place] [address] Phone: [phone] Fax: [phone]  [facility], [place] - [address] [place] [address] Phone: [phone] Fax: [phone]     Social Determinants of Health ([place]) Interventions    Readmission Risk Interventions No flowsheet data found.

## 2018-09-10 NOTE — Progress Notes (Signed)
Physical Therapy Treatment Patient Details Name: Theresa Lynch MRN: [id number] DOB: 1929/12/19 Today's Date: 09/10/2018    History of Present Illness 85 yo female presenting to the ED with nausea and vomiting; Emesis is dark / coffee ground in color. COVID positive. PMH including arthritis, depression, left hip fx with IM nail placement (2019), HTN, and MM.    PT Comments    The patient paricipated in  Transfer to [facility] to be washed up. Then ambulated x 20' with RW to sit in recliner. Patient is steaier today than last visit. Continue PT for increased ambualtion.   Follow Up Recommendations  SNF     Equipment Recommendations  None recommended by PT    Recommendations for Other Services       Precautions / Restrictions Precautions Precautions: Fall Precaution Comments: incontinent, check first    Mobility  Bed Mobility   Bed Mobility: Supine to Sit     Supine to sit: Mod assist     General bed mobility comments: assisted to sitting, extra time  Transfers Overall transfer level: Needs assistance Equipment used: Rolling walker (2 wheeled) Transfers: Sit to/from [facility] Sit to Stand: Mod assist Stand pivot transfers: Mod assist       General transfer comment: steady assist to stand from bed and turn around to [facility]. Patient had incontinence in bed.  Ambulation/Gait Ambulation/Gait assistance: Mod assist Gait Distance (Feet): 20 Feet Assistive device: Rolling walker (2 wheeled) Gait Pattern/deviations: Step-to pattern;Staggering right Gait velocity: decr   General Gait Details: pt is ambulating Lynch and had no issues bmaking 20' to recliner. did just  fall into recliner after turning around.   Stairs             Wheelchair Mobility    Modified Rankin (Stroke Patients Only)       Balance                                            Cognition Arousal/Alertness: Awake/alert Behavior During Therapy: Flat  affect                                   General Comments: pt very passive      Exercises      General Comments        Pertinent Vitals/Pain Pain Assessment: Faces Faces Pain Scale: Hurts little more Pain Location: generalized Pain Descriptors / Indicators: Discomfort Pain Intervention(s): Monitored during session    Home Living                      Prior Function            PT Goals (current goals can now be found in the care plan section) Progress towards PT goals: Progressing toward goals    Frequency    Min 2X/week      PT Plan Current plan remains appropriate    Co-evaluation              AM-PAC PT "6 Clicks" Mobility   Outcome Measure  Help needed turning from your back to your side while in a flat bed without using bedrails?: A Lot Help needed moving from lying on your back to sitting on the side of a flat bed without using bedrails?: A Lot Help needed  moving to and from a bed to a chair (including a wheelchair)?: A Lot Help needed standing up from a chair using your arms (e.g., wheelchair or bedside chair)?: A Lot Help needed to walk in hospital room?: A Lot Help needed climbing 3-5 steps with a railing? : Total 6 Click Score: 11    End of Session Equipment Utilized During Treatment: Gait belt Activity Tolerance: Patient tolerated treatment well Patient left: in chair;with call bell/phone within reach;with chair alarm set Nurse Communication: Mobility status PT Visit Diagnosis: Muscle weakness (generalized) (M62.81);Difficulty in walking, not elsewhere classified (R26.2)     Time: 3343-5686 PT Time Calculation (min) (ACUTE ONLY): 38 min  Charges:  $Gait Training: 8-22 mins $Therapeutic Activity: 8-22 mins $Self Care/Home Management: [place]  Office [phone]    Claretha Cooper 09/10/2018, 1:49 PM

## 2018-09-10 NOTE — Progress Notes (Signed)
Daughter Susan updated 

## 2018-09-10 NOTE — Progress Notes (Signed)
PROGRESS NOTE  Theresa Lynch YTK:[id number] DOB: December 04, 1929 DOA: 09/01/2018  PCP: [name], MD  Brief History/Interval Summary:   85 y.o. female with medical history significant of MM.  Patient presents to the ED with c/o N/V for the past 1 day.  Emesis was dark / coffee ground in color and concerning for hematemesis. She has also had  diarrhea for the past 1 week.  Also had productive cough for the past 10 days or more for which her oncologist stopped her revlimid on 7/31.  She was found to be positive for COVID-19.  She was also noted to have a lower hemoglobin than her baseline.  She went into atrial fibrillation with mild RVR while she was in the ED.  She was hospitalized for further management.    Subjective/Interval History:  Patient denies any complaints today, no chest pain, no nausea, no vomiting, he is asking when she can go home  Antibiotics: Anti-infectives (From admission, onward)   Start     Dose/Rate Route Frequency Ordered Stop   09/02/18 1400  remdesivir 100 mg in sodium chloride 0.9 % 250 mL IVPB     100 mg 500 mL/hr over 30 Minutes Intravenous Every 24 hours 09/01/18 1232 09/05/18 1429   09/01/18 1400  remdesivir 200 mg in sodium chloride 0.9 % 250 mL IVPB     200 mg 500 mL/hr over 30 Minutes Intravenous Once 09/01/18 1232 09/01/18 1900          Assessment/Plan:  Acute Hypoxic Resp. Failure due to Acute Covid 19 Viral Illness  COVID-19 Labs  No results for input(s): DDIMER, FERRITIN, LDH, CRP in the last 72 hours.  Lab Results  Component Value Date   SARSCOV2NAA POSITIVE (A) 08/31/2018     Fever: Remains afebrile Oxygen requirements: She is currently on room air Antibacterials: None Remdesivir: Last day 09/05/2018 Steroids: Dexamethasone 6 mg daily x 10 days. Diuretics: None Actemra: No indication as yet DVT Prophylaxis: SCDs/subcu heparin  Remains on room air, with saturation in the mid 90s , continue with steroids, finished her  IV Remdesivir course. -D-Dimer is elevated, but trending down, on DVT prophylaxis subcu heparin started yesterday.  COVID-19 Labs  No results for input(s): DDIMER, FERRITIN, LDH, CRP in the last 72 hours.  Lab Results  Component Value Date   SARSCOV2NAA POSITIVE (A) 08/31/2018     Suspected upper GI bleed Patient apparently had coffee-ground emesis.  Hemoglobin was noted to be lower than her baseline.  Has not had any episodes of GI bleed in the hospital.  Hemoglobin is low but stable for the most part.  Continue with PPI.  CT scan of the abdomen and pelvis did not show any acute findings.  Continue to hold on antiplatelet agents.  Hemoglobin  remained stable, started on subcu heparin for DVT prophylaxis.  Atrial fibrillation with mild RVR This was noted in the emergency department.  Patient started on beta-blocker.  Dose was adjusted yesterday.  Heart rate seems to be better controlled.  TSH 0.753.  Free T4 noted to be mildly elevated at 1.56.  Significance of this is not entirely clear in the setting of acute illness.  We will recommend rechecking thyroid function test in a few weeks time.  Not a candidate for full dose anticoagulation due to suspected GI bleed as well as her other comorbidities and poor long-term prognosis as a result of her comorbidities.  Daughter agrees with this.  We will also hold off on echocardiogram as it  will not change management.  Acute blood loss anemia Baseline hemoglobin is between 10 and 11.  Noted to be 8.3 at admission.  Hemoglobin has been stable the last few days.  Continue to monitor.  AKI -Creatinine increased to 1.68 today, volume depletion, responded to IV fluids .  History of multiple myeloma Patient was on Revlimid recently.  Discontinued on July 31.  According to notes in EMR patient has been transition to palliative care.  She has stopped active medical treatment for her cancer.  Previous MD Discussed with her oncologist, Dr. Rogue Bussing,    Apparently her multiple myeloma is mostly in the background.  Will not affect prognosis in this acute setting.  Hypokalemia Potassium level has improved.  Magnesium 2.0.    Essential hypertension Blood pressure reasonably well controlled.  Amlodipine on hold as the patient was started on metoprolol.   DVT Prophylaxis: SCDs, subcu heparin PUD Prophylaxis: Protonix Code Status: DNR Family Communication: D/W daughter via phone 8/14 Disposition Plan: Will need SNF placement, awaiting bed availability  Consultants: Phone discussion with gastroenterology by admitting provider  Procedures: None   Medications:  Scheduled: . atorvastatin  5 mg Oral Daily  . calcium carbonate  1 tablet Oral BID WC  . cholecalciferol  2,000 Units Oral BID  . feeding supplement (ENSURE ENLIVE)  237 mL Oral BID BM  . heparin injection (subcutaneous)  5,000 Units Subcutaneous Q8H  . levothyroxine  50 mcg Oral Daily  . metoprolol tartrate  25 mg Oral BID  . pantoprazole  40 mg Oral BID  . PARoxetine  40 mg Oral QHS  . traZODone  25 mg Oral QHS   Continuous:  XQJ:JHERDEYCXKGYJ, guaiFENesin-dextromethorphan, ondansetron **OR** ondansetron (ZOFRAN) IV, traMADol   Objective:  Vital Signs  Vitals:   09/09/18 1646 09/09/18 1933 09/10/18 0516 09/10/18 0816  BP: 103/66 112/72  (!) 116/99  Pulse: 79   88  Resp:  18 20   Temp: 98.5 F (36.9 C) 98 F (36.7 C) 97.7 F (36.5 C) 97.7 F (36.5 C)  TempSrc: Oral Oral Oral   SpO2:    91%  Weight:      Height:        Intake/Output Summary (Last 24 hours) at 09/10/2018 1324 Last data filed at 09/10/2018 0930 Gross per 24 hour  Intake 200 ml  Output -  Net 200 ml   Filed Weights   09/04/18 0103  Weight: 64 kg    Awake Alert, Oriented X 3, No new F.N deficits, Normal affect Symmetrical Chest wall movement, Good air movement bilaterally, CTAB RRR,No Gallops,Rubs or new Murmurs, No Parasternal Heave +ve B.Sounds, Abd Soft, No tenderness, No rebound -  guarding or rigidity. No Cyanosis, Clubbing or edema, No new Rash or bruise     Lab Results:  Data Reviewed: I have personally reviewed following labs and imaging studies  CBC: Recent Labs  Lab 09/04/18 0240 09/05/18 0130 09/06/18 0236 09/07/18 0035 09/08/18 0241 09/10/18 0227  WBC 9.1 9.9 11.9* 12.1* 9.6 11.8*  NEUTROABS 8.1* 8.6* 10.4*  --   --   --   HGB 8.2* 8.4* 8.3* 8.6* 8.4* 8.2*  HCT 27.9* 29.1* 28.5* 28.9* 28.1* 27.3*  MCV 85.1 86.4 84.6 84.5 82.2 84.0  PLT 453* 464* 431* 455* 480* 502*    Basic Metabolic Panel: Recent Labs  Lab 09/05/18 0130 09/06/18 0236 09/07/18 0035 09/08/18 0241 09/10/18 0227  NA 138 135 134* 137 138  K 5.4* 4.8 5.0 4.7 4.9  CL 105 106 105  106 105  CO2 18* 17* 20* 24 24  GLUCOSE 104* 128* 106* 117* 80  BUN 62* 61* 55* 48* 45*  CREATININE 1.68* 1.51* 1.40* 1.18* 1.07*  CALCIUM 7.9* 8.2* 8.0* 8.3* 8.3*    GFR: Estimated Creatinine Clearance: 34.7 mL/min (A) (by C-G formula based on SCr of 1.07 mg/dL (H)).  Liver Function Tests: Recent Labs  Lab 09/04/18 0240 09/05/18 0130 09/06/18 0236  AST _0 ALT _1 ALKPHOS 52 59 60  BILITOT 0.5 0.4 0.6  PROT 5.8* 6.0* 5.8*  ALBUMIN 2.3* 2.5* 2.4*    No results for input(s): LIPASE, AMYLASE in the last 168 hours.  Coagulation Profile: No results for input(s): INR, PROTIME in the last 168 hours.    Recent Results (from the past 240 hour(s))  SARS Coronavirus 2 [facility] order, Performed in [facility] hospital lab) Nasopharyngeal Nasopharyngeal Swab     Status: Abnormal   Collection Time: 08/31/18  3:40 PM   Specimen: Nasopharyngeal Swab  Result Value Ref Range Status   SARS Coronavirus 2 POSITIVE (A) NEGATIVE Final    Comment: RESULT CALLED TO, READ BACK BY AND VERIFIED WITH: STEPHEN JONES _2  ON 08/31/2018 BY FMW (NOTE) If result is NEGATIVE SARS-CoV-2 target nucleic acids are NOT DETECTED. The SARS-CoV-2 RNA is generally detectable in upper and lower   respiratory specimens during the acute phase of infection. The lowest  concentration of SARS-CoV-2 viral copies this assay can detect is 250  copies / mL. A negative result does not preclude SARS-CoV-2 infection  and should not be used as the sole basis for treatment or other  patient management decisions.  A negative result may occur with  improper specimen collection / handling, submission of specimen other  than nasopharyngeal swab, presence of viral mutation(s) within the  areas targeted by this assay, and inadequate number of viral copies  (<250 copies / mL). A negative result must be combined with clinical  observations, patient history, and epidemiological information. If result is POSITIVE SARS-CoV-2 target nucleic acids are DETECTE D. The SARS-CoV-2 RNA is generally detectable in upper and lower  respiratory specimens during the acute phase of infection.  Positive  results are indicative of active infection with SARS-CoV-2.  Clinical  correlation with patient history and other diagnostic information is  necessary to determine patient infection status.  Positive results do  not rule out bacterial infection or co-infection with other viruses. If result is PRESUMPTIVE POSTIVE SARS-CoV-2 nucleic acids MAY BE PRESENT.   A presumptive positive result was obtained on the submitted specimen  and confirmed on repeat testing.  While 2019 novel coronavirus  (SARS-CoV-2) nucleic acids may be present in the submitted sample  additional confirmatory testing may be necessary for epidemiological  and / or clinical management purposes  to differentiate between  SARS-CoV-2 and other Sarbecovirus currently known to infect humans.  If clinically indicated additional testing with an alternate test  methodology (LAB745 3) is advised. The SARS-CoV-2 RNA is generally  detectable in upper and lower respiratory specimens during the acute  phase of infection. The expected result is Negative. Fact  Sheet for Patients:  StrictlyIdeas.no Fact Sheet for Healthcare Providers: BankingDealers.co.za This test is not yet approved or cleared by the [place] FDA and has been authorized for detection and/or diagnosis of SARS-CoV-2 by FDA under an Emergency Use Authorization (EUA).  This EUA will remain in effect (meaning this test can be used) for the duration of the COVID-19 declaration under Section  564(b)(1) of the Act, 21 U.S.C. section 360bbb-3(b)(1), unless the authorization is terminated or revoked sooner. Performed at [facility], [address] [place], [place] [address]   Urine culture     Status: None   Collection Time: 08/31/18  4:30 PM   Specimen: Urine, Clean Catch  Result Value Ref Range Status   Specimen Description   Final    URINE, CLEAN CATCH Performed at [facility], [address] [place], [place] [address]    Special Requests   Final    NONE Performed at [facility], [address], [place], [place] [address]    Culture   Final    Multiple bacterial morphotypes present, none predominant. Suggest appropriate recollection if clinically indicated.   Report Status 09/02/2018 FINAL  Final      Radiology Studies: No results found.    LOS: 9 days   Phillips Climes MD  [facility] Pager on [email or web address]  09/10/2018, 1:24 PM

## 2018-09-10 NOTE — Plan of Care (Signed)
Pt had uneventful day. Up to [facility] and chair during beginning of shift. Pt worked with therapy and got a bath. Pt sat in chair for 4 hours throughout the day. Appetite normal. VSS. Pt remains on RA. No c/o pain. Pt remained free from injury. Call bell within reach. Encouraged to call for assistance. Will continue to monitor   Problem: Education: Goal: Knowledge of risk factors and measures for prevention of condition will improve Outcome: Progressing

## 2018-09-10 NOTE — Progress Notes (Signed)
Occupational Therapy Treatment Patient Details Name: Theresa Lynch MRN: [id number] DOB: 1929/08/27 Today's Date: 09/10/2018    History of present illness 85 yo female presenting to the ED with nausea and vomiting; Emesis is dark / coffee ground in color. COVID positive. PMH including arthritis, depression, left hip fx with IM nail placement (2019), HTN, and MM.   OT comments  Pt continues to present with decreased balance, strength, and activity tolerance. Upon arrival, pt upright in recliner and requesting to return to bed. Pt requiring Mod A for sit<>stand and then pivot to EOB. Pt with poor safety and tolerance and falling back onto EOB as soon as she is close. Discussed poor safety and fall risks. Update dc recommendation to SNF and will continue to follow acutely as admitted.    Follow Up Recommendations  SNF;Supervision/Assistance - 24 hour    Equipment Recommendations  3 in 1 bedside commode(Defer to next venue)    Recommendations for Other Services PT consult    Precautions / Restrictions Precautions Precautions: Fall Precaution Comments: incontinent, check first Restrictions Weight Bearing Restrictions: No       Mobility Bed Mobility Overal bed mobility: Needs Assistance Bed Mobility: Sit to Supine     Supine to sit: Mod assist Sit to supine: Min assist   General bed mobility comments: Min A to bring BLEs over EOB  Transfers Overall transfer level: Needs assistance Equipment used: Rolling walker (2 wheeled) Transfers: Sit to/from [facility] Sit to Stand: Mod assist Stand pivot transfers: Mod assist       General transfer comment: Mod A for power up and then to maintain balance during pivot    Balance Overall balance assessment: Needs assistance Sitting-balance support: No upper extremity supported;Feet supported Sitting balance-Leahy Scale: Fair     Standing balance support: Bilateral upper extremity supported;During functional  activity;Single extremity supported Standing balance-Leahy Scale: Poor Standing balance comment: Reliant on UE support                           ADL either performed or assessed with clinical judgement   ADL Overall ADL's : Needs assistance/impaired                         Toilet Transfer: Minimal assistance;Ambulation(simulated in bed) Toilet Transfer Details (indicate cue type and reason): Min A for safety. When getting near EOB, pt falls back onto EOB. Discussed decreased safety and pt just stating she is so tired.          Functional mobility during ADLs: Minimal assistance;Rolling walker(short distance) General ADL Comments: Pt requiring increased encouragement to participate in therapy. Performing stand pivot transfer to recliner and requiring assistance to clean up after incontience in bed     Vision       Perception     Praxis      Cognition Arousal/Alertness: Awake/alert Behavior During Therapy: Flat affect Overall Cognitive Status: Impaired/Different from baseline Area of Impairment: Safety/judgement;Awareness                         Safety/Judgement: Decreased awareness of deficits     General Comments: Pt with low spirits and passive in participation.         Exercises     Shoulder Instructions       General Comments VSS on RA    Pertinent Vitals/ Pain       Pain  Assessment: Faces Faces Pain Scale: Hurts little more Pain Location: generalized Pain Descriptors / Indicators: Discomfort Pain Intervention(s): Monitored during session;Limited activity within patient's tolerance;Repositioned  Home Living                                          Prior Functioning/Environment              Frequency  Min 2X/week        Progress Toward Goals  OT Goals(current goals can now be found in the care plan section)  Progress towards OT goals: Not progressing toward goals - comment(Very  fatigued)  Acute Rehab OT Goals Patient Stated Goal: "Feel better and go home" OT Goal Formulation: With patient Time For Goal Achievement: 09/16/18 Potential to Achieve Goals: Good ADL Goals Pt Will Perform Grooming: with set-up;with supervision;standing Pt Will Perform Upper Body Dressing: with set-up;with supervision;sitting Pt Will Perform Lower Body Dressing: with min guard assist;sit to/from stand Pt Will Transfer to Toilet: with min guard assist;bedside commode;ambulating Additional ADL Goal #1: Pt will verablize three energy conservation techniques for ADLs with Min cues  Plan Discharge plan needs to be updated    Co-evaluation                 AM-PAC OT "6 Clicks" Daily Activity     Outcome Measure   Help from another person eating meals?: None Help from another person taking care of personal grooming?: A Little Help from another person toileting, which includes using toliet, bedpan, or urinal?: A Little Help from another person bathing (including washing, rinsing, drying)?: A Lot Help from another person to put on and taking off regular upper body clothing?: A Little Help from another person to put on and taking off regular lower body clothing?: A Lot 6 Click Score: 17    End of Session Equipment Utilized During Treatment: Rolling walker  OT Visit Diagnosis: Unsteadiness on feet (R26.81);Other abnormalities of gait and mobility (R26.89);Muscle weakness (generalized) (M62.81)   Activity Tolerance Patient tolerated treatment well;Patient limited by fatigue   Patient Left with call bell/phone within reach;in bed;with bed alarm set   Nurse Communication Mobility status        Time: 0768-0881 OT Time Calculation (min): 15 min  Charges: OT General Charges $OT Visit: 1 Visit OT Treatments $Self Care/Home Management : 8-22 mins  [place], OTR/L Acute Rehab Pager: [phone] Office: [place] 09/10/2018, 3:56  PM

## 2018-09-11 MED ORDER — PANTOPRAZOLE SODIUM 40 MG PO TBEC: 40.0000 mg | DELAYED_RELEASE_TABLET | Freq: Two times a day (BID) | ORAL | Status: AC

## 2018-09-11 MED ORDER — METOPROLOL TARTRATE 25 MG PO TABS: 25.0000 mg | ORAL_TABLET | Freq: Two times a day (BID) | ORAL | Status: AC

## 2018-09-11 MED ORDER — ENSURE ENLIVE PO LIQD: 237.0000 mL | Freq: Two times a day (BID) | ORAL | 12 refills | Status: AC

## 2018-09-11 MED ORDER — ASPIRIN EC 81 MG PO TBEC: 81.0000 mg | DELAYED_RELEASE_TABLET | Freq: Every day | ORAL | 1 refills | Status: AC

## 2018-09-11 NOTE — Discharge Summary (Addendum)
Theresa Lynch, is a 85 y.o. female  DOB 03/22/1929  MRN [id number].  Admission date:  09/01/2018  Admitting Physician  Etta Quill, DO  Discharge Date:  09/11/2018   Primary MD  [name], MD  Recommendations for primary care physician for things to follow:  -Please check CBC, BMP in 3 days to ensure stable renal function, and stable hemoglobin. -Resume aspirin in 1 week if hemoglobin remained stable. -Attempt voiding trial after she is more ambulatory with PT in few days.  CODE STATUS: DNR   Admission Diagnosis  COVID 19   Discharge Diagnosis  COVID 19    Principal Problem:   UGIB (upper gastrointestinal bleed) Active Problems:   Multiple myeloma in relapse (HCC)   COVID-19 virus infection   PAF (paroxysmal atrial fibrillation) (HCC)   AKI (acute kidney injury) ([place])      Past Medical History:  Diagnosis Date   Arthritis    hands   Cataract    Claustrophobia    Depression    with anxiety/h/o agoraphobia   Glaucoma    Hip fracture (HCC)    left hip   HOH (hard of hearing)    Hyperlipidemia    Hypertension    Multiple myeloma (HCC)    PONV (postoperative nausea and vomiting)     Past Surgical History:  Procedure Laterality Date   CATARACT EXTRACTION W/PHACO Right 03/15/2015   Procedure: CATARACT EXTRACTION PHACO AND INTRAOCULAR LENS PLACEMENT ([place]);  Surgeon: Ronnell Freshwater, MD;  Location: [facility];  Service: Ophthalmology;  Laterality: Right;   HIP SURGERY     INTRAMEDULLARY (IM) NAIL INTERTROCHANTERIC Left 05/25/2017   Procedure: INTRAMEDULLARY (IM) NAIL INTERTROCHANTRIC;  Surgeon: Thornton Park, MD;  Location: [facility];  Service: Orthopedics;  Laterality: Left;   ROTATOR CUFF REPAIR  1999   bilateral       History of present illness and  Hospital Course:     Kindly see H&P for history of present illness and  admission details, please review complete Labs, Consult reports and Test reports for all details in brief  HPI  from the history and physical done on the day of admission 09/01/2018 HPI: Theresa Lynch is a 85 y.o. female with medical history significant of MM.  Patient presents to the ED with c/o N/V for the past 1 day.  Emesis is dark / coffee ground in color and concerning for hematemesis.  She has also had NB diarrhea for the past 1 week.  Also had productive cough for the past 10 days or more for which her oncologist stopped her revlimid on 7/31.  No abd pain, does have decreased appetite and increased generalized weakness.  ED Course: COVID positive.  HGB has dropped from 11.2 last month to 8.3 today.  K 2.8, given 40 meq PO  She takes ASA 81, no other blood thinners.  While in the ED she also went into new onset A.Fib with mild RVR rate 110-120.   Hospital Course   85 y.o.femalewith medical  history significant ofMM. Patient presents to the ED with c/o N/V for the past 1 day. Emesis was dark / coffee ground in color and concerning for hematemesis. She has also had  diarrhea for the past 1 week. Also had productive cough for the past 10 days or more for which her oncologist stopped her revlimid on 7/31.  She was found to be positive for COVID-19.  She was also noted to have a lower hemoglobin than her baseline.  She went into atrial fibrillation with mild RVR while she was in the ED.   Acute Hypoxic Resp. Failure due to Acute Covid 19 Viral Illness  COVID-19 Labs  Recent Labs (last 2 labs)   No results for input(s): DDIMER, FERRITIN, LDH, CRP in the last 72 hours.    Recent Labs       Lab Results  Component Value Date   SARSCOV2NAA POSITIVE (A) 08/31/2018       Fever: Remains afebrile Oxygen requirements: She is currently on room air Antibacterials: None Remdesivir: Last day 09/05/2018 Steroids: Dexamethasone 6 mg daily x 10 days.  No further  steroids on discharge Diuretics: None Actemra: Indicated, she did not receive DVT Prophylaxis: SCDs/subcu heparin during hospital stay  Remains on room air, with saturation in the mid 90s , encouraged use incentive spirometry   COVID-19 Labs  Recent Labs       Lab Results  Component Value Date   [place] (A) 08/31/2018       Suspected upper GI bleed Patient apparently had coffee-ground emesis.  Hemoglobin was noted to be lower than her baseline.  Has not had any episodes of GI bleed in the hospital.  Hemoglobin is low but stable for the most part.  Continue with PPI.  CT scan of the abdomen and pelvis did not show any acute findings.  Continue to hold on antiplatelet agents.  Hemoglobin  remained stable, she will be discharged on Protonix twice daily  Atrial fibrillation with mild RVR This was noted in the emergency department.  Patient started on beta-blocker.  Dose was adjusted yesterday.  Heart rate seems to be better controlled.  TSH 0.753.  Free T4 noted to be mildly elevated at 1.56.  Significance of this is not entirely clear in the setting of acute illness.  We will recommend rechecking thyroid function test in a few weeks time.  Not a candidate for full dose anticoagulation due to suspected GI bleed as well as her other comorbidities and poor long-term prognosis as a result of her comorbidities.  Daughter agrees with this.  We will also hold off on echocardiogram as it will not change management.  Acute blood loss anemia Baseline hemoglobin is between 10 and 11.  Noted to be 8.3 at admission.  Hemoglobin has been stable the last few days.  Continue to monitor.  AKI -Creatinine peaked at 1.68, was volume depletion, she responded to IV fluids, creatinine normal at time of discharge.   History of multiple myeloma Patient was on Revlimid recently.  Discontinued on July 31.  According to notes in EMR patient has been transition to palliative care.  She has  stopped active medical treatment for her cancer.  Previous MD Discussed with her oncologist, Dr. Rogue Bussing,   Apparently her multiple myeloma is mostly in the background.  Will not affect prognosis in this acute setting.  Hypokalemia Potassium level has improved.  Magnesium 2.0.    Essential hypertension Blood pressure reasonably well controlled.  Amlodipine been stopped as she  was started on metoprolol for heart rate control.  Urinary retention -Overnight patient had evidence of urinary retention, she required in and out couple times, Foley catheter will be inserted today secondary to urinary retention, she will need voiding trial in few days when she is more ambulatory with PT.  Discharge Condition:  Stable  ADDENDUM; nothing is changed, patient stable for discharge      Discharge Instructions  and  Discharge Medications   Discharge Instructions    Discharge instructions   Complete by: As directed    Follow with Primary MD [name], MD in 7 days   Get CBC, CMP,  checked  by Primary MD next visit.    Activity: As tolerated with Full fall precautions use walker/cane & assistance as needed   Disposition SNF   Diet: Regular diet.  For Heart failure patients - Check your Weight same time everyday, if you gain over 2 pounds, or you develop in leg swelling, experience more shortness of breath or chest pain, call your Primary MD immediately. Follow Cardiac Low Salt Diet and 1.5 lit/day fluid restriction.   On your next visit with your primary care physician please Get Medicines reviewed and adjusted.   Please request your Prim.MD to go over all Hospital Tests and Procedure/Radiological results at the follow up, please get all Hospital records sent to your Prim MD by signing hospital release before you go home.   If you experience worsening of your admission symptoms, develop shortness of breath, life threatening emergency, suicidal or homicidal thoughts you must  seek medical attention immediately by calling 911 or calling your MD immediately  if symptoms less severe.  You Must read complete instructions/literature along with all the possible adverse reactions/side effects for all the Medicines you take and that have been prescribed to you. Take any new Medicines after you have completely understood and accpet all the possible adverse reactions/side effects.   Do not drive, operating heavy machinery, perform activities at heights, swimming or participation in water activities or provide baby sitting services if your were admitted for syncope or siezures until you have seen by Primary MD or a Neurologist and advised to do so again.  Do not drive when taking Pain medications.    Do not take more than prescribed Pain, Sleep and Anxiety Medications  Special Instructions: If you have smoked or chewed Tobacco  in the last 2 yrs please stop smoking, stop any regular Alcohol  and or any Recreational drug use.  Wear Seat belts while driving.   Please note  You were cared for by a hospitalist during your hospital stay. If you have any questions about your discharge medications or the care you received while you were in the hospital after you are discharged, you can call the unit and asked to speak with the hospitalist on call if the hospitalist that took care of you is not available. Once you are discharged, your primary care physician will handle any further medical issues. Please note that NO REFILLS for any discharge medications will be authorized once you are discharged, as it is imperative that you return to your primary care physician (or establish a relationship with a primary care physician if you do not have one) for your aftercare needs so that they can reassess your need for medications and monitor your lab values.   Increase activity slowly   Complete by: As directed      Allergies as of 09/11/2018      Reactions  Codeine Nausea And Vomiting    Penicillins Swelling, Other (See Comments)   Lips swell Has patient had a PCN reaction causing immediate rash, facial/tongue/throat swelling, SOB or lightheadedness with hypotension: Yes Has patient had a PCN reaction causing severe rash involving mucus membranes or skin necrosis: No Has patient had a PCN reaction that required hospitalization: No Has patient had a PCN reaction occurring within the last 10 years: No If all of the above answers are "NO", then may proceed with Cephalosporin use.      Medication List    STOP taking these medications   amLODipine 5 MG tablet Commonly known as: NORVASC   dexamethasone 4 MG tablet Commonly known as: DECADRON     TAKE these medications   aspirin EC 81 MG tablet Take 1 tablet (81 mg total) by mouth daily. Please start in 1 week if Hgb stable Start taking on: September 18, 2018 What changed:   additional instructions  These instructions start on September 18, 2018. If you are unsure what to do until then, ask your doctor or other care provider.   atorvastatin 10 MG tablet Commonly known as: LIPITOR Take 5 mg by mouth daily.   calcium gluconate 500 MG tablet Take 1 tablet by mouth 2 (two) times daily.   cholecalciferol 25 MCG (1000 UT) tablet Commonly known as: VITAMIN D3 Take 2,000 Units by mouth 2 (two) times daily.   feeding supplement (ENSURE ENLIVE) Liqd Take 237 mLs by mouth 2 (two) times daily between meals.   lenalidomide 5 MG capsule Commonly known as: REVLIMID Take 1 capsule (5 mg total) by mouth daily. Take for 14 days on, then 7 days off.   levothyroxine 50 MCG tablet Commonly known as: SYNTHROID Take 1 tablet by mouth daily.   metoprolol tartrate 25 MG tablet Commonly known as: LOPRESSOR Take 1 tablet (25 mg total) by mouth 2 (two) times daily.   pantoprazole 40 MG tablet Commonly known as: PROTONIX Take 1 tablet (40 mg total) by mouth 2 (two) times daily.   PARoxetine 40 MG tablet Commonly known as:  PAXIL Take 40 mg by mouth at bedtime.   polyethylene glycol 17 g packet Commonly known as: MIRALAX / GLYCOLAX Take 17 g by mouth daily.   traZODone 50 MG tablet Commonly known as: DESYREL Take 25 mg by mouth at bedtime.   vitamin B-12 1000 MCG tablet Commonly known as: CYANOCOBALAMIN Take 1,000 mcg by mouth daily.         Diet and Activity recommendation: See Discharge Instructions above   Major procedures and Radiology Reports - PLEASE review detailed and final reports for all details, in brief -     Ct Abdomen Pelvis Wo Contrast  Result Date: 08/31/2018 CLINICAL DATA:  Abdominal pain. EXAM: CT ABDOMEN AND PELVIS WITHOUT CONTRAST TECHNIQUE: Multidetector CT imaging of the abdomen and pelvis was performed following the standard protocol without IV contrast. COMPARISON:  None. FINDINGS: Lower chest: There is a small right and a trace left pleural effusion. There are diffuse bilateral ground-glass airspace opacities, most notable involving the right lower lobe. There is some interlobular septal thickening involving the right lower lobes.The heart size is normal. Hepatobiliary: The liver is normal. The gallbladder is folded. There may be some gallbladder sludge. There is no CT evidence for acute cholecystitis.There is no biliary ductal dilation. Pancreas: Normal contours without ductal dilatation. No peripancreatic fluid collection. Spleen: No splenic laceration or hematoma. Adrenals/Urinary Tract: --Adrenal glands: No adrenal hemorrhage. --Right kidney/ureter: No hydronephrosis or  perinephric hematoma. --Left kidney/ureter: No hydronephrosis or perinephric hematoma. --Urinary bladder: Unremarkable. Stomach/Bowel: --Stomach/Duodenum: There is a large hiatal hernia. --Small bowel: No dilatation or inflammation. --Colon: Rectosigmoid diverticulosis without acute inflammation. --Appendix: Not visualized. No right lower quadrant inflammation or free fluid. Vascular/Lymphatic: Atherosclerotic  calcification is present within the non-aneurysmal abdominal aorta, without hemodynamically significant stenosis. --No retroperitoneal lymphadenopathy. --No mesenteric lymphadenopathy. --No pelvic or inguinal lymphadenopathy. Reproductive: Unremarkable Other: No ascites or free air. The abdominal wall is normal. Musculoskeletal. There is an intramedullary nail in the proximal left femur. There are old healed or healing fractures of of the left pubic rami. There is a healed or healing insufficiency fracture of the left sacrum. There are advanced degenerative changes throughout the visualized lumbar spine. There is diffuse osteopenia. IMPRESSION: 1. Diffuse bilateral ground-glass airspace opacities involving the visualized lung bases. There are trace to small bilateral pleural effusions, right greater than left. Differential considerations include pulmonary edema versus an atypical infectious process such as viral pneumonia. 2. Large hiatal hernia. 3. Probable gallbladder sludge without CT evidence for acute cholecystitis. 4. Sigmoid diverticulosis without CT evidence for diverticulitis. 5. Multiple healed/healing pelvic fractures as detailed above. No acute displaced fracture identified on today's exam. Electronically Signed   By: Constance Holster M.D.   On: 08/31/2018 15:20   Dg Chest Portable 1 View  Result Date: 08/31/2018 CLINICAL DATA:  Weakness EXAM: PORTABLE CHEST 1 VIEW COMPARISON:  January 13, 2018 FINDINGS: There are multifocal airspace opacities bilaterally. There are prominent interstitial lung markings. The heart size is enlarged. There is no acute osseous abnormality. Aortic calcifications are suspected. There is a small right-sided pleural effusion. There is a trace left-sided pleural effusion. IMPRESSION: 1. Multifocal airspace opacities concerning for multifocal pneumonia (viral or bacterial) or pulmonary edema. 2. Cardiomegaly. 3. Trace to small bilateral pleural effusions. Electronically  Signed   By: Constance Holster M.D.   On: 08/31/2018 15:36    Micro Results    No results found for this or any previous visit (from the past 240 hour(s)).     Today   Subjective:   Theresa Lynch today has no headache, chest pain, no shortness of breath, had urinary retention overnight .  Objective:   Blood pressure 110/68, pulse 83, temperature 98.2 F (36.8 C), temperature source Oral, resp. rate 18, height '5\' 6"'  (1.676 m), weight 64 kg, SpO2 91 %.   Intake/Output Summary (Last 24 hours) at 09/11/2018 1222 Last data filed at 09/11/2018 0500 Gross per 24 hour  Intake 720 ml  Output 900 ml  Net -180 ml    Exam Awake Alert, Oriented x 3, No new F.N deficits, Normal affect  Symmetrical Chest wall movement, Good air movement bilaterally, CTAB RRR,No Gallops,Rubs or new Murmurs, No Parasternal Heave +ve B.Sounds, Abd Soft, Non tender, No rebound -guarding or rigidity. No Cyanosis, Clubbing or edema, No new Rash or bruise  Data Review   CBC w Diff:  Lab Results  Component Value Date   WBC 11.8 (H) 09/10/2018   HGB 8.2 (L) 09/10/2018   HCT 27.3 (L) 09/10/2018   PLT 502 (H) 09/10/2018   LYMPHOPCT 5 09/06/2018   MONOPCT 7 09/06/2018   EOSPCT 0 09/06/2018   BASOPCT 0 09/06/2018    CMP:  Lab Results  Component Value Date   NA 138 09/10/2018   K 4.9 09/10/2018   CL 105 09/10/2018   CO2 24 09/10/2018   BUN 45 (H) 09/10/2018   CREATININE 1.07 (H) 09/10/2018   PROT 5.8 (  L) 09/06/2018   ALBUMIN 2.4 (L) 09/06/2018   BILITOT 0.6 09/06/2018   ALKPHOS 60 09/06/2018   AST 15 09/06/2018   ALT 16 09/06/2018  .   Total Time in preparing paper work, data evaluation and todays exam - 76 minutes  Phillips Climes M.D on 09/11/2018 at 12:22 PM  [facility]   Office  [phone]

## 2018-09-11 NOTE — Discharge Instructions (Signed)
Follow with Primary MD Theresa Body, MD in 7 days   Get CBC, CMP,  checked  by Primary MD next visit.    Activity: As tolerated with Full fall precautions use walker/cane & assistance as needed   Disposition SNF   Diet: Regular diet.  For Heart failure patients - Check your Weight same time everyday, if you gain over 2 pounds, or you develop in leg swelling, experience more shortness of breath or chest pain, call your Primary MD immediately. Follow Cardiac Low Salt Diet and 1.5 lit/day fluid restriction.   On your next visit with your primary care physician please Get Medicines reviewed and adjusted.   Please request your Prim.MD to go over all Hospital Tests and Procedure/Radiological results at the follow up, please get all Hospital records sent to your Prim MD by signing hospital release before you go home.   If you experience worsening of your admission symptoms, develop shortness of breath, life threatening emergency, suicidal or homicidal thoughts you must seek medical attention immediately by calling 911 or calling your MD immediately  if symptoms less severe.  You Must read complete instructions/literature along with all the possible adverse reactions/side effects for all the Medicines you take and that have been prescribed to you. Take any new Medicines after you have completely understood and accpet all the possible adverse reactions/side effects.   Do not drive, operating heavy machinery, perform activities at heights, swimming or participation in water activities or provide baby sitting services if your were admitted for syncope or siezures until you have seen by Primary MD or a Neurologist and advised to do so again.  Do not drive when taking Pain medications.    Do not take more than prescribed Pain, Sleep and Anxiety Medications  Special Instructions: If you have smoked or chewed Tobacco  in the last 2 yrs please stop smoking, stop any regular Alcohol  and or any  Recreational drug use.  Wear Seat belts while driving.   Please note  You were cared for by a hospitalist during your hospital stay. If you have any questions about your discharge medications or the care you received while you were in the hospital after you are discharged, you can call the unit and asked to speak with the hospitalist on call if the hospitalist that took care of you is not available. Once you are discharged, your primary care physician will handle any further medical issues. Please note that NO REFILLS for any discharge medications will be authorized once you are discharged, as it is imperative that you return to your primary care physician (or establish a relationship with a primary care physician if you do not have one) for your aftercare needs so that they can reassess your need for medications and monitor your lab values.

## 2018-09-11 NOTE — Progress Notes (Signed)
Pt c/o abdominal pain, continuous, sharp/crampy.  Abdomen is tender to palpation, BS active, lower abdomen firm and distended.    Bladder scan - 976 mL.  I&O cath - Anda Kraft RN observe/assist.  900 mL urine obtained.  Clear, no odor, dark yellow.     Pt reports pain resolved.

## 2018-09-11 NOTE — Care Management (Signed)
Case Manager waiting for PASSAR number to update FL2  For discharge. PASSAR is under manual review.      Ricki Miller, RN BSN Case Manager [phone]

## 2018-09-11 NOTE — Care Management Important Message (Signed)
Important Message  Patient Details  Name: Theresa Lynch MRN: [id number] Date of Birth: [birth year]   Medicare Important Message Given:  Yes - Important Message mailed due to current National Emergency   Verbal consent obtained due to current National Emergency  Relationship to patient: Child Contact Name: Max Sane Call Date: 09/11/18  Time: 1607 Phone: [phone] Outcome: Spoke with contact Important Message mailed to: Patient address on file     Tommy Medal 09/11/2018, 4:08 PM

## 2018-09-12 DIAGNOSIS — S066X9A Traumatic subarachnoid hemorrhage with loss of consciousness of unspecified duration, initial encounter: Secondary | ICD-10-CM

## 2018-09-12 DIAGNOSIS — I610 Nontraumatic intracerebral hemorrhage in hemisphere, subcortical: Secondary | ICD-10-CM

## 2018-09-12 DIAGNOSIS — I10 Essential (primary) hypertension: Secondary | ICD-10-CM

## 2018-09-12 DIAGNOSIS — R7303 Prediabetes: Secondary | ICD-10-CM

## 2018-09-12 DIAGNOSIS — N183 Chronic kidney disease, stage 3 (moderate): Secondary | ICD-10-CM

## 2018-09-12 DIAGNOSIS — S069X0A Unspecified intracranial injury without loss of consciousness, initial encounter: Secondary | ICD-10-CM

## 2018-09-12 DIAGNOSIS — D539 Nutritional anemia, unspecified: Secondary | ICD-10-CM

## 2018-09-12 DIAGNOSIS — S02109A Fracture of base of skull, unspecified side, initial encounter for closed fracture: Secondary | ICD-10-CM

## 2018-09-12 DIAGNOSIS — E7800 Pure hypercholesterolemia, unspecified: Secondary | ICD-10-CM

## 2018-09-12 DIAGNOSIS — E039 Hypothyroidism, unspecified: Secondary | ICD-10-CM

## 2018-09-12 NOTE — Care Management (Signed)
Case manager faxed FL2, H&P to [facility] for review, will send 30 day note when signed by MD.     Ricki Miller, RN BSN Case Manager [phone]

## 2018-09-12 NOTE — Progress Notes (Signed)
PROGRESS NOTE    Theresa Lynch  ACZ:[id number] DOB: 11/13/1929 DOA: 09/01/2018 PCP: [name], MD   Brief Narrative:  85 y.o. WF PMHx essential HTN, HLD, TBI, subarachnoid hemorrhage, paroxysmal atrial fibrillation, syncope, benign paroxysmal positional vertigo, upper GI bleed, CKD stage III MM relapse.  Macrocytic anemia,  Presents to the ED with c/o N/V for the past 1 day.  Emesis is dark / coffee ground in color and concerning for hematemesis.  She has also had NB diarrhea for the past 1 week.  Also had productive cough for the past 10 days or more for which her oncologist stopped her revlimid on 7/31.  No abd pain, does have decreased appetite and increased generalized weakness.  ED Course: COVID positive.  HGB has dropped from 11.2 last month to 8.3 today.  K 2.8, given 40 meq PO  She takes ASA 81, no other blood thinners.  While in the ED she also went into new onset A.Fib with mild RVR rate 110-120.   Subjective: 8/20 A/O x3 (does not know where), negative S OB, negative CP, negative N/V, negative abdominal pain.   Assessment & Plan:   Principal Problem:   UGIB (upper gastrointestinal bleed) Active Problems:   Intracerebral hemorrhage (HCC)   Subarachnoid hemorrhage (HCC)   Closed basilar skull fracture with subarachnoid hemorrhage (HCC)   TBI (traumatic brain injury) ([place])   Syncope   Multiple myeloma in relapse ([place])   Acquired hypothyroidism   Borderline diabetes mellitus   Essential hypertension   Macrocytic anemia   Pure hypercholesterolemia   Stage 3 chronic kidney disease (HCC)   COVID-19 virus infection   PAF (paroxysmal atrial fibrillation) (HCC)   AKI (acute kidney injury) ([place])  Acute Hypoxic Resp. Failure due to Acute Covid 19 Viral Illness  COVID-19 Labs  Recent Labs (last 2 labs)   No results for input(s): DDIMER, FERRITIN, LDH, CRP in the last 72 hours.    Recent Labs       Lab Results  Component Value Date    SARSCOV2NAA POSITIVE (A) 08/31/2018       Fever: Remains afebrile Oxygen requirements: She is currently on room air Antibacterials: None Remdesivir: Last day 09/05/2018 Steroids: Dexamethasone 6 mg dailyx 10 days.  No further steroids on discharge Diuretics: None Actemra: Indicated, she did not receive DVT Prophylaxis: SCDs/subcu heparin during hospital stay  Remains on room air, with saturation in the mid 90s , encouraged use incentive spirometry   COVID-19 Labs  Recent Labs       Lab Results  Component Value Date   [place] (A) 08/31/2018       Suspected upper GI bleed Patient apparently had coffee-ground emesis. Hemoglobin was noted to be lower than her baseline. Has not had any episodes of GI bleed in the hospital. Hemoglobin is low but stable for the most part. Continue with PPI. CT scan of the abdomen and pelvis did not show any acute findings. Continue to hold on antiplatelet agents. Hemoglobin remained stable, she will be discharged on Protonix twice daily  Atrial fibrillation with mild RVR This was noted in the emergency department. Patient started on beta-blocker. Dose was adjusted yesterday. Heart rate seems to be better controlled. TSH 0.753. Free T4 noted to be mildly elevated at 1.56. Significance of this is not entirely clear in the setting of acute illness. We will recommend rechecking thyroid function test in a few weeks time. Not a candidate for full dose anticoagulation due to suspected GI bleed as  well as her other comorbidities and poor long-term prognosis as a result of her comorbidities. Daughter agrees with this. We will also hold off on echocardiogram as it will not change management.  Acute blood loss anemia Baseline hemoglobin is between 10 and 11. Noted to be 8.3 at admission. Hemoglobin has been stable the last few days. Continue to monitor.  AKI -Creatinine peaked at 1.68, was volume depletion,  she responded to IV fluids, creatinine normal at time of discharge.   History of multiple myeloma Patient was on Revlimid recently. Discontinued on July 31. According to notes in EMR patient has been transition to palliative care. She has stopped active medical treatment for her cancer. Previous MD Discussed with her oncologist, Dr. Rogue Bussing, Apparently her multiple myeloma is mostly in the background. Will not affect prognosis in this acute setting.  Hypokalemia Potassium level has improved. Magnesium 2.0.   Essential hypertension Blood pressure reasonably well controlled. Amlodipine been stopped as she was started on metoprolol for heart rate control.  Urinary retention -Overnight patient had evidence of urinary retention, she required in and out couple times, Foley catheter will be inserted today secondary to urinary retention, she will need voiding trial in few days when she is more ambulatory with PT.  Goals of care; awaiting SNF placement   DVT prophylaxis: Subcu heparin Code Status: DNR Family Communication: None Disposition Plan: Discharge; awaiting SNF placement   Consultants:    Procedures/Significant Events:     I have personally reviewed and interpreted all radiology studies and my findings are as above.  VENTILATOR SETTINGS:    Cultures   Antimicrobials: Anti-infectives (From admission, onward)   Start     Stop   09/02/18 1400  remdesivir 100 mg in sodium chloride 0.9 % 250 mL IVPB     09/05/18 1429   09/01/18 1400  remdesivir 200 mg in sodium chloride 0.9 % 250 mL IVPB     09/01/18 1900       Devices    LINES / TUBES:      Continuous Infusions:   Objective: Vitals:   09/11/18 1050 09/11/18 1630 09/11/18 1952 09/12/18 0444  BP: 110/68 106/68 119/72 120/71  Pulse: 83 95 87 80  Resp:   18   Temp:  97.8 F (36.6 C) 98.7 F (37.1 C) 98.6 F (37 C)  TempSrc:  Oral Oral Oral  SpO2:  90% 92% 94%  Weight:      Height:         Intake/Output Summary (Last 24 hours) at 09/12/2018 0907 Last data filed at 09/12/2018 0300 Gross per 24 hour  Intake 360 ml  Output 400 ml  Net -40 ml   Filed Weights   09/04/18 0103  Weight: 64 kg    Examination:  General: A/O x3 (does not know where), no acute respiratory distress Eyes: negative scleral hemorrhage, negative anisocoria, negative icterus ENT: Negative Runny nose, negative gingival bleeding, Neck:  Negative scars, masses, torticollis, lymphadenopathy, JVD Lungs: Clear to auscultation bilaterally without wheezes or crackles Cardiovascular: Regular rate and rhythm without murmur gallop or rub normal S1 and S2 Abdomen: negative abdominal pain, nondistended, positive soft, bowel sounds, no rebound, no ascites, no appreciable mass Extremities: No significant cyanosis, clubbing, or edema bilateral lower extremities Skin: Negative rashes, lesions, ulcers Psychiatric:  Negative depression, negative anxiety, negative fatigue, negative mania  Central nervous system:  Cranial nerves II through XII intact, tongue/uvula midline, all extremities muscle strength 5/5, sensation intact throughout,  negative dysarthria, negative expressive aphasia,  negative receptive aphasia.  .     Data Reviewed: Care during the described time interval was provided by me .  I have reviewed this patient's available data, including medical history, events of note, physical examination, and all test results as part of my evaluation.   CBC: Recent Labs  Lab 09/06/18 0236 09/07/18 0035 09/08/18 0241 09/10/18 0227  WBC 11.9* 12.1* 9.6 11.8*  NEUTROABS 10.4*  --   --   --   HGB 8.3* 8.6* 8.4* 8.2*  HCT 28.5* 28.9* 28.1* 27.3*  MCV 84.6 84.5 82.2 84.0  PLT 431* 455* 480* 809*   Basic Metabolic Panel: Recent Labs  Lab 09/06/18 0236 09/07/18 0035 09/08/18 0241 09/10/18 0227  NA 135 134* 137 138  K 4.8 5.0 4.7 4.9  CL 106 105 106 105  CO2 17* 20* 24 24  GLUCOSE 128* 106* 117* 80  BUN  61* 55* 48* 45*  CREATININE 1.51* 1.40* 1.18* 1.07*  CALCIUM 8.2* 8.0* 8.3* 8.3*   GFR: Estimated Creatinine Clearance: 34.7 mL/min (A) (by C-G formula based on SCr of 1.07 mg/dL (H)). Liver Function Tests: Recent Labs  Lab 09/06/18 0236  AST 15  ALT 16  ALKPHOS 60  BILITOT 0.6  PROT 5.8*  ALBUMIN 2.4*   No results for input(s): LIPASE, AMYLASE in the last 168 hours. No results for input(s): AMMONIA in the last 168 hours. Coagulation Profile: No results for input(s): INR, PROTIME in the last 168 hours. Cardiac Enzymes: No results for input(s): CKTOTAL, CKMB, CKMBINDEX, TROPONINI in the last 168 hours. BNP (last 3 results) No results for input(s): PROBNP in the last 8760 hours. HbA1C: No results for input(s): HGBA1C in the last 72 hours. CBG: No results for input(s): GLUCAP in the last 168 hours. Lipid Profile: No results for input(s): CHOL, HDL, LDLCALC, TRIG, CHOLHDL, LDLDIRECT in the last 72 hours. Thyroid Function Tests: No results for input(s): TSH, T4TOTAL, FREET4, T3FREE, THYROIDAB in the last 72 hours. Anemia Panel: No results for input(s): VITAMINB12, FOLATE, FERRITIN, TIBC, IRON, RETICCTPCT in the last 72 hours. Urine analysis:    Component Value Date/Time   COLORURINE YELLOW (A) 08/31/2018 1630   APPEARANCEUR HAZY (A) 08/31/2018 1630   LABSPEC 1.005 08/31/2018 1630   PHURINE 6.0 08/31/2018 1630   GLUCOSEU NEGATIVE 08/31/2018 1630   HGBUR LARGE (A) 08/31/2018 1630   BILIRUBINUR NEGATIVE 08/31/2018 1630   KETONESUR 5 (A) 08/31/2018 1630   PROTEINUR NEGATIVE 08/31/2018 1630   UROBILINOGEN 1.0 08/20/2012 2305   NITRITE NEGATIVE 08/31/2018 1630   LEUKOCYTESUR TRACE (A) 08/31/2018 1630   Sepsis Labs: '@LABRCNTIP' (procalcitonin:4,lacticidven:4)  )No results found for this or any previous visit (from the past 240 hour(s)).       Radiology Studies: No results found.      Scheduled Meds: . atorvastatin  5 mg Oral Daily  . calcium carbonate  1 tablet  Oral BID WC  . cholecalciferol  2,000 Units Oral BID  . feeding supplement (ENSURE ENLIVE)  237 mL Oral BID BM  . heparin injection (subcutaneous)  5,000 Units Subcutaneous Q8H  . levothyroxine  50 mcg Oral Daily  . metoprolol tartrate  25 mg Oral BID  . pantoprazole  40 mg Oral BID  . PARoxetine  40 mg Oral QHS  . traZODone  25 mg Oral QHS   Continuous Infusions:   LOS: 11 days   The patient is critically ill with multiple organ systems failure and requires high complexity decision making for assessment and support, frequent evaluation and  titration of therapies, application of advanced monitoring technologies and extensive interpretation of multiple databases. Critical Care Time devoted to patient care services described in this note  Time spent: 40 minutes     Adriana Lina, Geraldo Docker, MD [facility] Pager [phone]  If 7PM-7AM, please contact night-coverage [email or web address] Password [email or web address] 09/12/2018, 9:07 AM

## 2018-09-12 NOTE — Care Management (Signed)
Case manager confirmed with telephone call that [facility] has received all requested information, per representative should receive a response either later this evening or in the morning. CM will continue to monitor. Case Manager updated Theresa Lynch at [facility] as well.      Ricki Miller, RN BSN Case Manager [phone]

## 2018-09-13 DIAGNOSIS — T83511A Infection and inflammatory reaction due to indwelling urethral catheter, initial encounter: Secondary | ICD-10-CM | POA: Diagnosis not present

## 2018-09-13 DIAGNOSIS — S069X0D Unspecified intracranial injury without loss of consciousness, subsequent encounter: Secondary | ICD-10-CM | POA: Diagnosis not present

## 2018-09-13 DIAGNOSIS — Z743 Need for continuous supervision: Secondary | ICD-10-CM | POA: Diagnosis not present

## 2018-09-13 DIAGNOSIS — S069X9S Unspecified intracranial injury with loss of consciousness of unspecified duration, sequela: Secondary | ICD-10-CM | POA: Diagnosis not present

## 2018-09-13 DIAGNOSIS — R2681 Unsteadiness on feet: Secondary | ICD-10-CM | POA: Diagnosis not present

## 2018-09-13 DIAGNOSIS — E038 Other specified hypothyroidism: Secondary | ICD-10-CM | POA: Diagnosis not present

## 2018-09-13 DIAGNOSIS — R41841 Cognitive communication deficit: Secondary | ICD-10-CM | POA: Diagnosis not present

## 2018-09-13 DIAGNOSIS — D6489 Other specified anemias: Secondary | ICD-10-CM | POA: Diagnosis not present

## 2018-09-13 DIAGNOSIS — K922 Gastrointestinal hemorrhage, unspecified: Secondary | ICD-10-CM | POA: Diagnosis not present

## 2018-09-13 DIAGNOSIS — R52 Pain, unspecified: Secondary | ICD-10-CM | POA: Diagnosis not present

## 2018-09-13 DIAGNOSIS — F320 Major depressive disorder, single episode, mild: Secondary | ICD-10-CM | POA: Diagnosis not present

## 2018-09-13 DIAGNOSIS — E7800 Pure hypercholesterolemia, unspecified: Secondary | ICD-10-CM | POA: Diagnosis not present

## 2018-09-13 DIAGNOSIS — K9289 Other specified diseases of the digestive system: Secondary | ICD-10-CM | POA: Diagnosis not present

## 2018-09-13 DIAGNOSIS — F39 Unspecified mood [affective] disorder: Secondary | ICD-10-CM | POA: Diagnosis not present

## 2018-09-13 DIAGNOSIS — E039 Hypothyroidism, unspecified: Secondary | ICD-10-CM | POA: Diagnosis not present

## 2018-09-13 DIAGNOSIS — N390 Urinary tract infection, site not specified: Secondary | ICD-10-CM | POA: Diagnosis not present

## 2018-09-13 DIAGNOSIS — R1312 Dysphagia, oropharyngeal phase: Secondary | ICD-10-CM | POA: Diagnosis not present

## 2018-09-13 DIAGNOSIS — N179 Acute kidney failure, unspecified: Secondary | ICD-10-CM | POA: Diagnosis not present

## 2018-09-13 DIAGNOSIS — R278 Other lack of coordination: Secondary | ICD-10-CM | POA: Diagnosis not present

## 2018-09-13 DIAGNOSIS — R4182 Altered mental status, unspecified: Secondary | ICD-10-CM | POA: Diagnosis not present

## 2018-09-13 DIAGNOSIS — E7849 Other hyperlipidemia: Secondary | ICD-10-CM | POA: Diagnosis not present

## 2018-09-13 DIAGNOSIS — R7303 Prediabetes: Secondary | ICD-10-CM | POA: Diagnosis not present

## 2018-09-13 DIAGNOSIS — R2689 Other abnormalities of gait and mobility: Secondary | ICD-10-CM | POA: Diagnosis not present

## 2018-09-13 DIAGNOSIS — N178 Other acute kidney failure: Secondary | ICD-10-CM | POA: Diagnosis not present

## 2018-09-13 DIAGNOSIS — N183 Chronic kidney disease, stage 3 (moderate): Secondary | ICD-10-CM | POA: Diagnosis not present

## 2018-09-13 DIAGNOSIS — D539 Nutritional anemia, unspecified: Secondary | ICD-10-CM | POA: Diagnosis not present

## 2018-09-13 DIAGNOSIS — R310 Gross hematuria: Secondary | ICD-10-CM | POA: Diagnosis not present

## 2018-09-13 DIAGNOSIS — U071 COVID-19: Secondary | ICD-10-CM | POA: Diagnosis not present

## 2018-09-13 DIAGNOSIS — N138 Other obstructive and reflux uropathy: Secondary | ICD-10-CM | POA: Diagnosis not present

## 2018-09-13 DIAGNOSIS — R5381 Other malaise: Secondary | ICD-10-CM | POA: Diagnosis not present

## 2018-09-13 DIAGNOSIS — C9000 Multiple myeloma not having achieved remission: Secondary | ICD-10-CM | POA: Diagnosis not present

## 2018-09-13 DIAGNOSIS — M6281 Muscle weakness (generalized): Secondary | ICD-10-CM | POA: Diagnosis not present

## 2018-09-13 DIAGNOSIS — R338 Other retention of urine: Secondary | ICD-10-CM | POA: Diagnosis not present

## 2018-09-13 DIAGNOSIS — I480 Paroxysmal atrial fibrillation: Secondary | ICD-10-CM | POA: Diagnosis not present

## 2018-09-13 DIAGNOSIS — E876 Hypokalemia: Secondary | ICD-10-CM | POA: Diagnosis not present

## 2018-09-13 DIAGNOSIS — I10 Essential (primary) hypertension: Secondary | ICD-10-CM | POA: Diagnosis not present

## 2018-09-13 DIAGNOSIS — R4701 Aphasia: Secondary | ICD-10-CM | POA: Diagnosis not present

## 2018-09-13 DIAGNOSIS — J1289 Other viral pneumonia: Secondary | ICD-10-CM | POA: Diagnosis not present

## 2018-09-13 DIAGNOSIS — C9002 Multiple myeloma in relapse: Secondary | ICD-10-CM | POA: Diagnosis not present

## 2018-09-13 DIAGNOSIS — R946 Abnormal results of thyroid function studies: Secondary | ICD-10-CM | POA: Diagnosis not present

## 2018-09-13 DIAGNOSIS — R279 Unspecified lack of coordination: Secondary | ICD-10-CM | POA: Diagnosis not present

## 2018-09-13 LAB — CBC WITH DIFFERENTIAL/PLATELET
Abs Immature Granulocytes: 0.09 10*3/uL — ABNORMAL HIGH (ref 0.00–0.07)
Basophils Absolute: 0 10*3/uL (ref 0.0–0.1)
Basophils Relative: 0 %
Eosinophils Absolute: 0 10*3/uL (ref 0.0–0.5)
Eosinophils Relative: 0 %
HCT: 28.7 % — ABNORMAL LOW (ref 36.0–46.0)
Hemoglobin: 8.3 g/dL — ABNORMAL LOW (ref 12.0–15.0)
Immature Granulocytes: 1 %
Lymphocytes Relative: 9 %
Lymphs Abs: 1.4 10*3/uL (ref 0.7–4.0)
MCH: 24.7 pg — ABNORMAL LOW (ref 26.0–34.0)
MCHC: 28.9 g/dL — ABNORMAL LOW (ref 30.0–36.0)
MCV: 85.4 fL (ref 80.0–100.0)
Monocytes Absolute: 1.1 10*3/uL — ABNORMAL HIGH (ref 0.1–1.0)
Monocytes Relative: 7 %
Neutro Abs: 12.3 10*3/uL — ABNORMAL HIGH (ref 1.7–7.7)
Neutrophils Relative %: 83 %
Platelets: 474 10*3/uL — ABNORMAL HIGH (ref 150–400)
RBC: 3.36 MIL/uL — ABNORMAL LOW (ref 3.87–5.11)
RDW: 18.6 % — ABNORMAL HIGH (ref 11.5–15.5)
WBC: 14.9 10*3/uL — ABNORMAL HIGH (ref 4.0–10.5)
nRBC: 0 % (ref 0.0–0.2)

## 2018-09-13 LAB — COMPREHENSIVE METABOLIC PANEL
ALT: 17 U/L (ref 0–44)
AST: 13 U/L — ABNORMAL LOW (ref 15–41)
Albumin: 2.3 g/dL — ABNORMAL LOW (ref 3.5–5.0)
Alkaline Phosphatase: 55 U/L (ref 38–126)
Anion gap: 8 (ref 5–15)
BUN: 36 mg/dL — ABNORMAL HIGH (ref 8–23)
CO2: 23 mmol/L (ref 22–32)
Calcium: 8.2 mg/dL — ABNORMAL LOW (ref 8.9–10.3)
Chloride: 107 mmol/L (ref 98–111)
Creatinine, Ser: 1.15 mg/dL — ABNORMAL HIGH (ref 0.44–1.00)
GFR calc Af Amer: 50 mL/min — ABNORMAL LOW (ref >60)
GFR calc non Af Amer: 43 mL/min — ABNORMAL LOW (ref >60)
Glucose, Bld: 74 mg/dL (ref 70–99)
Potassium: 4.2 mmol/L (ref 3.5–5.1)
Sodium: 138 mmol/L (ref 135–145)
Total Bilirubin: 0.7 mg/dL (ref 0.3–1.2)
Total Protein: 5.6 g/dL — ABNORMAL LOW (ref 6.5–8.1)

## 2018-09-13 LAB — MAGNESIUM: Magnesium: 1.9 mg/dL (ref 1.7–2.4)

## 2018-09-13 LAB — D-DIMER, QUANTITATIVE: D-Dimer, Quant: 1.47 ug/mL-FEU — ABNORMAL HIGH (ref 0.00–0.50)

## 2018-09-13 LAB — C-REACTIVE PROTEIN: CRP: 0.8 mg/dL (ref <1.0)

## 2018-09-13 NOTE — Progress Notes (Signed)
This RN attempted report at [facility] x3 with no answer.

## 2018-09-13 NOTE — Plan of Care (Signed)
  Problem: Respiratory: Goal: Complications related to the disease process, condition or treatment will be avoided or minimized Outcome: Progressing   Problem: Activity: Goal: Risk for activity intolerance will decrease Outcome: Progressing   Problem: Nutrition: Goal: Adequate nutrition will be maintained Outcome: Progressing   Problem: Pain Managment: Goal: General experience of comfort will improve Outcome: Progressing   Problem: Skin Integrity: Goal: Risk for impaired skin integrity will decrease Outcome: Progressing

## 2018-09-13 NOTE — Care Management (Signed)
Case manager has received PASSAR [id number]. Called [place] at [place] with number. Will update MD and will contact patient's daughter to inform her patient will discharge this morning to [facility]. Will notify bedside Nurse to call report to [facility] at [phone], patient will be going to room 803.       Ricki Miller, RN BSN Case Manager [phone]

## 2018-09-13 NOTE — Progress Notes (Signed)
Updated daughter on patient condition. Informed of plan to transfer to nursing facility today

## 2018-09-13 NOTE — Progress Notes (Signed)
Report given to Nursing Director at [facility]

## 2018-09-13 NOTE — Care Management (Signed)
Case manager called [facility] to check on status of PASSAR, informed information is in nurses que, and will be reviewed this morning. CM will continue to monitor.    Ricki Miller, RN Case Manager [phone]

## 2018-09-16 DIAGNOSIS — R5381 Other malaise: Secondary | ICD-10-CM | POA: Diagnosis not present

## 2018-09-16 DIAGNOSIS — N178 Other acute kidney failure: Secondary | ICD-10-CM | POA: Diagnosis not present

## 2018-09-16 DIAGNOSIS — S069X9S Unspecified intracranial injury with loss of consciousness of unspecified duration, sequela: Secondary | ICD-10-CM | POA: Diagnosis not present

## 2018-09-16 DIAGNOSIS — N138 Other obstructive and reflux uropathy: Secondary | ICD-10-CM | POA: Diagnosis not present

## 2018-09-16 DIAGNOSIS — R310 Gross hematuria: Secondary | ICD-10-CM | POA: Diagnosis not present

## 2018-09-17 DIAGNOSIS — N178 Other acute kidney failure: Secondary | ICD-10-CM | POA: Diagnosis not present

## 2018-09-17 DIAGNOSIS — D6489 Other specified anemias: Secondary | ICD-10-CM | POA: Diagnosis not present

## 2018-09-17 DIAGNOSIS — C9000 Multiple myeloma not having achieved remission: Secondary | ICD-10-CM | POA: Diagnosis not present

## 2018-09-17 DIAGNOSIS — K922 Gastrointestinal hemorrhage, unspecified: Secondary | ICD-10-CM | POA: Diagnosis not present

## 2018-09-17 DIAGNOSIS — U071 COVID-19: Secondary | ICD-10-CM | POA: Diagnosis not present

## 2018-09-17 DIAGNOSIS — R946 Abnormal results of thyroid function studies: Secondary | ICD-10-CM | POA: Diagnosis not present

## 2018-09-17 DIAGNOSIS — I10 Essential (primary) hypertension: Secondary | ICD-10-CM | POA: Diagnosis not present

## 2018-09-17 DIAGNOSIS — I480 Paroxysmal atrial fibrillation: Secondary | ICD-10-CM | POA: Diagnosis not present

## 2018-09-17 DIAGNOSIS — F39 Unspecified mood [affective] disorder: Secondary | ICD-10-CM | POA: Diagnosis not present

## 2018-09-17 DIAGNOSIS — S069X9S Unspecified intracranial injury with loss of consciousness of unspecified duration, sequela: Secondary | ICD-10-CM | POA: Diagnosis not present

## 2018-09-17 DIAGNOSIS — N138 Other obstructive and reflux uropathy: Secondary | ICD-10-CM | POA: Diagnosis not present

## 2018-09-17 DIAGNOSIS — R4701 Aphasia: Secondary | ICD-10-CM | POA: Diagnosis not present

## 2018-09-19 DIAGNOSIS — K9289 Other specified diseases of the digestive system: Secondary | ICD-10-CM | POA: Diagnosis not present

## 2018-09-19 DIAGNOSIS — J1289 Other viral pneumonia: Secondary | ICD-10-CM | POA: Diagnosis not present

## 2018-09-19 DIAGNOSIS — R5381 Other malaise: Secondary | ICD-10-CM | POA: Diagnosis not present

## 2018-09-19 DIAGNOSIS — R338 Other retention of urine: Secondary | ICD-10-CM | POA: Diagnosis not present

## 2018-09-19 DIAGNOSIS — N178 Other acute kidney failure: Secondary | ICD-10-CM | POA: Diagnosis not present

## 2018-09-19 DIAGNOSIS — T83511A Infection and inflammatory reaction due to indwelling urethral catheter, initial encounter: Secondary | ICD-10-CM | POA: Diagnosis not present

## 2018-09-19 DIAGNOSIS — N390 Urinary tract infection, site not specified: Secondary | ICD-10-CM | POA: Diagnosis not present

## 2018-09-24 ENCOUNTER — Other Ambulatory Visit: Payer: Self-pay | Admitting: *Deleted

## 2018-09-24 ENCOUNTER — Telehealth: Payer: Self-pay

## 2018-09-24 NOTE — Telephone Encounter (Signed)
Received call from Deloria Lair NP regarding pending d/c from [facility]. Daughter requested initial palliative consult be while patient is at [facility]. Per Deloria Lair NP, the facility social worker has been contacted with message left to receive ok from facility doctor. Palliative Referral and Palliative NP updated on this.

## 2018-09-24 NOTE — Patient Outreach (Signed)
[facility] request for assisting with appropriate discharge plans for SNF pt.  Reviewed chart. [name] was my pt one year ago. I spoke to her daughter, [name] on most of my phone calls.  Note pt was to have a palliative care consult on 08/30/18, however was being admitted to the hospital and diagnosed with [place]. She spent 10 days at the hospital and then was discharged to [facility]. Her discharge from the facility is pending.   Called and talked with pt's daughter, [name]. She hopes her mother can stay longer in the SNF to get her strength back to at least stand and pivot, if not also take some steps. She reports she has been making poor progress. She states the family is willing to pay privately if necessary to maximize her mother's mobility.  We discussed the prior palliative care referral and pending consult that was not able to be completed. [name] is very much interested in having that consult completed before her mother goes home.  Advised that I will make the requests from [facility] and [facility] and also request [facility] consider giving [name] another week of therapy.  Called [facility] and advised of request to see pt at [facility]. Called Toney Sang, LCSW, to advise of request for pc consult. Advised [facility] Market researcher or request for one more week of therapy.  I will gladly participate in any further needs for this family.  [name]. [name], MSN, [facility] Gerontological Nurse Practitioner [facility] Care Management [phone]

## 2018-09-25 DIAGNOSIS — R338 Other retention of urine: Secondary | ICD-10-CM | POA: Diagnosis not present

## 2018-09-25 DIAGNOSIS — N390 Urinary tract infection, site not specified: Secondary | ICD-10-CM | POA: Diagnosis not present

## 2018-09-25 DIAGNOSIS — U071 COVID-19: Secondary | ICD-10-CM | POA: Diagnosis not present

## 2018-09-25 DIAGNOSIS — R5381 Other malaise: Secondary | ICD-10-CM | POA: Diagnosis not present

## 2018-09-26 ENCOUNTER — Other Ambulatory Visit: Payer: Self-pay | Admitting: *Deleted

## 2018-09-26 NOTE — Patient Outreach (Signed)
Discussed case with [facility] medical director regarding pt pending discharge from [facility].  Sent e-mail to Toney Sang to reinerate voice mail I had left earlier in the week ( this was regarding possible private pay for short term if plan would not continue coverage. Also the request to have the [facility] consult that was previously scheduled at pt home, rescheduled to be done before pt discharge from facility.  Will continue to monitor pt progress and status.  [name]. [name], MSN, [facility] Gerontological Nurse Practitioner [facility] Care Management [phone]

## 2018-09-30 DIAGNOSIS — M6281 Muscle weakness (generalized): Secondary | ICD-10-CM | POA: Diagnosis not present

## 2018-09-30 DIAGNOSIS — U071 COVID-19: Secondary | ICD-10-CM | POA: Diagnosis not present

## 2018-09-30 DIAGNOSIS — R278 Other lack of coordination: Secondary | ICD-10-CM | POA: Diagnosis not present

## 2018-09-30 DIAGNOSIS — R41841 Cognitive communication deficit: Secondary | ICD-10-CM | POA: Diagnosis not present

## 2018-09-30 DIAGNOSIS — R2681 Unsteadiness on feet: Secondary | ICD-10-CM | POA: Diagnosis not present

## 2018-09-30 DIAGNOSIS — R2689 Other abnormalities of gait and mobility: Secondary | ICD-10-CM | POA: Diagnosis not present

## 2018-09-30 DIAGNOSIS — R1312 Dysphagia, oropharyngeal phase: Secondary | ICD-10-CM | POA: Diagnosis not present

## 2018-10-04 ENCOUNTER — Inpatient Hospital Stay (HOSPITAL_COMMUNITY)
Admission: EM | Admit: 2018-10-04 | Discharge: 2018-10-24 | DRG: 177 | Disposition: E | Payer: PPO | Source: Skilled Nursing Facility | Attending: Internal Medicine | Admitting: Internal Medicine

## 2018-10-04 ENCOUNTER — Emergency Department (HOSPITAL_COMMUNITY): Payer: PPO

## 2018-10-04 DIAGNOSIS — E875 Hyperkalemia: Secondary | ICD-10-CM | POA: Diagnosis present

## 2018-10-04 DIAGNOSIS — I480 Paroxysmal atrial fibrillation: Secondary | ICD-10-CM | POA: Diagnosis present

## 2018-10-04 DIAGNOSIS — Z800 Family history of malignant neoplasm of digestive organs: Secondary | ICD-10-CM

## 2018-10-04 DIAGNOSIS — Z515 Encounter for palliative care: Secondary | ICD-10-CM | POA: Diagnosis present

## 2018-10-04 DIAGNOSIS — N183 Chronic kidney disease, stage 3 (moderate): Secondary | ICD-10-CM | POA: Diagnosis not present

## 2018-10-04 DIAGNOSIS — Z880 Allergy status to penicillin: Secondary | ICD-10-CM

## 2018-10-04 DIAGNOSIS — J80 Acute respiratory distress syndrome: Secondary | ICD-10-CM | POA: Diagnosis not present

## 2018-10-04 DIAGNOSIS — J1289 Other viral pneumonia: Secondary | ICD-10-CM | POA: Diagnosis not present

## 2018-10-04 DIAGNOSIS — M19042 Primary osteoarthritis, left hand: Secondary | ICD-10-CM | POA: Diagnosis present

## 2018-10-04 DIAGNOSIS — Z8051 Family history of malignant neoplasm of kidney: Secondary | ICD-10-CM | POA: Diagnosis not present

## 2018-10-04 DIAGNOSIS — H9190 Unspecified hearing loss, unspecified ear: Secondary | ICD-10-CM | POA: Diagnosis not present

## 2018-10-04 DIAGNOSIS — H409 Unspecified glaucoma: Secondary | ICD-10-CM | POA: Diagnosis present

## 2018-10-04 DIAGNOSIS — Z803 Family history of malignant neoplasm of breast: Secondary | ICD-10-CM

## 2018-10-04 DIAGNOSIS — C9000 Multiple myeloma not having achieved remission: Secondary | ICD-10-CM | POA: Diagnosis not present

## 2018-10-04 DIAGNOSIS — E785 Hyperlipidemia, unspecified: Secondary | ICD-10-CM | POA: Diagnosis not present

## 2018-10-04 DIAGNOSIS — Z885 Allergy status to narcotic agent status: Secondary | ICD-10-CM

## 2018-10-04 DIAGNOSIS — R0602 Shortness of breath: Secondary | ICD-10-CM | POA: Diagnosis not present

## 2018-10-04 DIAGNOSIS — G9341 Metabolic encephalopathy: Secondary | ICD-10-CM | POA: Diagnosis not present

## 2018-10-04 DIAGNOSIS — M19041 Primary osteoarthritis, right hand: Secondary | ICD-10-CM | POA: Diagnosis present

## 2018-10-04 DIAGNOSIS — R0902 Hypoxemia: Secondary | ICD-10-CM | POA: Diagnosis not present

## 2018-10-04 DIAGNOSIS — J9601 Acute respiratory failure with hypoxia: Secondary | ICD-10-CM | POA: Diagnosis not present

## 2018-10-04 DIAGNOSIS — R1084 Generalized abdominal pain: Secondary | ICD-10-CM | POA: Diagnosis not present

## 2018-10-04 DIAGNOSIS — Z66 Do not resuscitate: Secondary | ICD-10-CM | POA: Diagnosis not present

## 2018-10-04 DIAGNOSIS — N179 Acute kidney failure, unspecified: Secondary | ICD-10-CM | POA: Diagnosis present

## 2018-10-04 DIAGNOSIS — I129 Hypertensive chronic kidney disease with stage 1 through stage 4 chronic kidney disease, or unspecified chronic kidney disease: Secondary | ICD-10-CM | POA: Diagnosis present

## 2018-10-04 DIAGNOSIS — I959 Hypotension, unspecified: Secondary | ICD-10-CM | POA: Diagnosis present

## 2018-10-04 DIAGNOSIS — R52 Pain, unspecified: Secondary | ICD-10-CM | POA: Diagnosis not present

## 2018-10-04 DIAGNOSIS — Z79890 Hormone replacement therapy (postmenopausal): Secondary | ICD-10-CM

## 2018-10-04 DIAGNOSIS — Z8249 Family history of ischemic heart disease and other diseases of the circulatory system: Secondary | ICD-10-CM | POA: Diagnosis not present

## 2018-10-04 DIAGNOSIS — Z7982 Long term (current) use of aspirin: Secondary | ICD-10-CM

## 2018-10-04 DIAGNOSIS — E039 Hypothyroidism, unspecified: Secondary | ICD-10-CM | POA: Diagnosis not present

## 2018-10-04 DIAGNOSIS — Z8042 Family history of malignant neoplasm of prostate: Secondary | ICD-10-CM

## 2018-10-04 DIAGNOSIS — U071 COVID-19: Secondary | ICD-10-CM | POA: Diagnosis not present

## 2018-10-04 DIAGNOSIS — E872 Acidosis: Secondary | ICD-10-CM | POA: Diagnosis present

## 2018-10-04 DIAGNOSIS — Z823 Family history of stroke: Secondary | ICD-10-CM

## 2018-10-04 DIAGNOSIS — I10 Essential (primary) hypertension: Secondary | ICD-10-CM | POA: Diagnosis present

## 2018-10-04 NOTE — ED Provider Notes (Signed)
[facility] EMERGENCY DEPARTMENT Provider Note   CSN: [id number] Arrival date & time: 10/19/2018  2252     History   Chief Complaint Chief Complaint  Patient presents with  . Shortness of Breath    HPI Theresa Lynch is a 85 y.o. female.     Patient is an 85 year old female with extensive past medical history including paroxysmal A. fib, hypertension, chronic renal insufficiency, paroxysmal A. Fib.  She presents by EMS for evaluation of shortness of breath.  Patient states that this is been present for several weeks, however became worse today.  She is found by her staff at the extended care facility appearing pale, diaphoretic, and tachypneic.  Initial oxygen saturations were in the upper 60s on room air and patient placed on nasal cannula.  Oxygen saturations have since improved into the 90s.  Patient denies to me she is experiencing any chest pain, fevers, chills, or cough.  Patient was admitted 1 month ago for a GI bleed.  Before that, patient had a prior diagnosis of COVID-19.  The history is provided by the patient.  Shortness of Breath Severity:  Moderate Onset quality:  Gradual Timing:  Constant Progression:  Worsening Chronicity:  New Relieved by:  Oxygen Worsened by:  Nothing Ineffective treatments:  None tried Associated symptoms: no cough, no fever, no hemoptysis and no sputum production     Past Medical History:  Diagnosis Date  . Arthritis    hands  . Cataract   . Claustrophobia   . Depression    with anxiety/h/o agoraphobia  . Glaucoma   . Hip fracture ([place])    left hip  . HOH (hard of hearing)   . Hyperlipidemia   . Hypertension   . Multiple myeloma ([place])   . PONV (postoperative nausea and vomiting)     Patient Active Problem List   Diagnosis Date Noted  . AKI (acute kidney injury) ([place])   . UGIB (upper gastrointestinal bleed) 09/01/2018  . COVID-19 virus infection 09/01/2018  . PAF (paroxysmal atrial fibrillation) ([place])  09/01/2018  . Pneumonia 01/13/2018  . Essential hypertension 12/07/2017  . Pure hypercholesterolemia 12/07/2017  . Acquired hypothyroidism 11/20/2017  . DNR (do not resuscitate) 10/15/2017  . Multiple myeloma in relapse ([place]) 06/15/2017  . Age-related osteoporosis with current pathological fracture with routine healing 06/01/2017  . Hip fracture ([place]) 05/25/2017  . Stage 3 chronic kidney disease ([place]) 08/21/2016  . Borderline diabetes mellitus 04/13/2015  . Macrocytic anemia 04/13/2015  . Vaccine counseling 04/12/2015  . Benign paroxysmal positional vertigo 09/27/2012  . Leucocytosis 08/23/2012  . TBI (traumatic brain injury) ([place]) 08/21/2012  . Hypokalemia 08/21/2012  . Hyponatremia 08/21/2012  . Syncope 08/21/2012  . Chest pain 08/21/2012  . Fall 08/18/2012  . Acute respiratory failure ([place]) 08/18/2012  . Acute blood loss anemia 08/18/2012  . Intracerebral hemorrhage ([place]) 08/15/2012  . Subarachnoid hemorrhage ([place]) 08/15/2012  . Closed basilar skull fracture with subarachnoid hemorrhage ([place]) 08/15/2012  . Closed fracture of facial bones ([place]) 08/15/2012  . History of intracranial hemorrhage 07/23/2012    Past Surgical History:  Procedure Laterality Date  . CATARACT EXTRACTION W/PHACO Right 03/15/2015   Procedure: CATARACT EXTRACTION PHACO AND INTRAOCULAR LENS PLACEMENT (IOC);  Surgeon: Ronnell Freshwater, MD;  Location: [facility];  Service: Ophthalmology;  Laterality: Right;  . HIP SURGERY    . INTRAMEDULLARY (IM) NAIL INTERTROCHANTERIC Left 05/25/2017   Procedure: INTRAMEDULLARY (IM) NAIL INTERTROCHANTRIC;  Surgeon: Thornton Park, MD;  Location: [facility];  Service: Orthopedics;  Laterality: Left;  . ROTATOR CUFF REPAIR  1999   bilateral     OB History   No obstetric history on file.      Home Medications    Prior to Admission medications   Medication Sig Start Date End Date Taking? Authorizing Provider  aspirin EC 81 MG tablet Take 1 tablet (81  mg total) by mouth daily. Please start in 1 week if Hgb stable 09/18/18   Elgergawy, Silver Huguenin, MD  atorvastatin (LIPITOR) 10 MG tablet Take 5 mg by mouth daily.     [provider]  calcium gluconate 500 MG tablet Take 1 tablet by mouth 2 (two) times daily.    [provider]  cholecalciferol (VITAMIN D3) 25 MCG (1000 UT) tablet Take 2,000 Units by mouth 2 (two) times daily.     [provider]  feeding supplement, ENSURE ENLIVE, (ENSURE ENLIVE) LIQD Take 237 mLs by mouth 2 (two) times daily between meals. 09/11/18   Elgergawy, Silver Huguenin, MD  lenalidomide (REVLIMID) 5 MG capsule Take 1 capsule (5 mg total) by mouth daily. Take for 14 days on, then 7 days off. 08/01/18   Cammie Sickle, MD  levothyroxine (SYNTHROID) 50 MCG tablet Take 1 tablet by mouth daily. 07/09/18   [provider]  metoprolol tartrate (LOPRESSOR) 25 MG tablet Take 1 tablet (25 mg total) by mouth 2 (two) times daily. 09/11/18   Elgergawy, Silver Huguenin, MD  pantoprazole (PROTONIX) 40 MG tablet Take 1 tablet (40 mg total) by mouth 2 (two) times daily. 09/11/18   Elgergawy, Silver Huguenin, MD  PARoxetine (PAXIL) 40 MG tablet Take 40 mg by mouth at bedtime. 09/05/12   Love, Ivan Anchors, PA-C  polyethylene glycol (MIRALAX / GLYCOLAX) packet Take 17 g by mouth daily. 06/01/17   Gladstone Lighter, MD  traZODone (DESYREL) 50 MG tablet Take 25 mg by mouth at bedtime.     [provider]  vitamin B-12 (CYANOCOBALAMIN) 1000 MCG tablet Take 1,000 mcg by mouth daily.    [provider]    Family History Family History  Problem Relation Age of Onset  . Cancer - Other Son        appendix cancer  . Prostate cancer Brother   . CAD Brother   . Kidney cancer Father   . Breast cancer Sister   . Stroke Sister     Social History Social History   Tobacco Use  . Smoking status: Never Smoker  . Smokeless tobacco: Never Used  Substance Use Topics  . Alcohol use: Yes    Alcohol/week: 7.0 standard  drinks    Types: 7 Glasses of wine per week  . Drug use: No     Allergies   Codeine and Penicillins   Review of Systems Review of Systems  Constitutional: Negative for fever.  Respiratory: Positive for shortness of breath. Negative for cough, hemoptysis and sputum production.   All other systems reviewed and are negative.    Physical Exam Updated Vital Signs There were no vitals taken for this visit.  Physical Exam Vitals signs and nursing note reviewed.  Constitutional:      General: She is not in acute distress.    Appearance: She is well-developed. She is not diaphoretic.     Comments: Patient is an elderly female.  She appears somewhat diaphoretic and pale.  HENT:     Head: Normocephalic and atraumatic.  Neck:     Musculoskeletal: Normal range of motion and neck  supple.  Cardiovascular:     Rate and Rhythm: Normal rate and regular rhythm.     Heart sounds: No murmur. No friction rub. No gallop.   Pulmonary:     Effort: Pulmonary effort is normal. No respiratory distress.     Breath sounds: Examination of the right-lower field reveals rales. Examination of the left-lower field reveals rales. Rales present.  Abdominal:     General: Bowel sounds are normal. There is no distension.     Palpations: Abdomen is soft.     Tenderness: There is no abdominal tenderness.  Musculoskeletal: Normal range of motion.     Right lower leg: She exhibits no tenderness. No edema.     Left lower leg: She exhibits no tenderness. No edema.  Skin:    General: Skin is warm and dry.  Neurological:     General: No focal deficit present.     Mental Status: She is alert and oriented to person, place, and time.      ED Treatments / Results  Labs (all labs ordered are listed, but only abnormal results are displayed) Labs Reviewed - No data to display  EKG EKG Interpretation  Date/Time:  Friday October 04 2018 22:52:51 EDT Ventricular Rate:  128 PR Interval:    QRS Duration: 99  QT Interval:  339 QTC Calculation: 485 R Axis:   62 Text Interpretation:  Atrial fibrillation Probable anterior infarct, age indeterminate Lateral leads are also involved No significant change since 08/31/2018 Confirmed by Veryl Speak [phone]) on 09/30/2018 11:13:23 PM   Radiology No results found.  Procedures Procedures (including critical care time)  Medications Ordered in ED Medications - No data to display   Initial Impression / Assessment and Plan / ED Course  I have reviewed the triage vital signs and the nursing notes.  Pertinent labs & imaging results that were available during my care of the patient were reviewed by me and considered in my medical decision making (see chart for details).  This patient is an 85 year old female with extensive past medical history including multiple myeloma with progressive decline, chronic renal insufficiency, paroxysmal A. fib, hypertension, and recent admission to the [facility] for COVID-19 infection.  After discharge from [facility], she had been rehabilitating at [facility] where she has been nonambulatory and and overall declining health.  She was sent to the ED for evaluation of shortness of breath and hypoxia.  Her initial oxygen saturations were in the 60s, then improved to the 90s while on nonrebreather.  Patient appears acutely ill upon arrival and I suspect her prognosis to be grim.  She was accompanied by a MOST form that clearly stated DNR/DNI status.  Upon reviewing the patient's medical records, it appears as though she has been a DNR/DNI for quite some time and that palliative medicine has been involved in this decision making process.  As her oxygen saturations were initially adequate on nonrebreather, I felt as though I had time to gather additional data and better establish the cause of her respiratory distress.  I believe that obtaining this information will assist me in the family in making an educated decision on how to  proceed with the patient's care.  There was apparently a delay in obtaining blood samples due to her being a difficult stick, but chest x-ray was obtained.  This showed bilateral pleural effusions with airspace disease suspicious of CHF versus multifocal pneumonia being at the top of the differential.  As this came on rapidly and the  patient had recently recovered from COVID-19, I suspected that acute CHF was the cause.  Patient was then given IV Lasix while awaiting additional results.  Her laboratory studies returned profoundly abnormal.  She was severely acidotic and likely septic with a pH of 7.1, PCO2 less than 7, lactate greater than 11, and WBC of 28,000.  I then ordered blood cultures and broad-spectrum antibiotics along with IV fluids.  Simultaneous to the reporting of her markedly abnormal labs, her condition had progressed to the point that serious decisions had to be made about how to proceed with her care.  I then called and spoke with the patient's daughter, [name].  [name] informed me that her mother was most definitely a DNR/DNI and did not want heroic measures.  The patient's son, [name] who was present in the ER also shared in this sentiment.  They both informed me of their mother's progressive decline both physically and emotionally since the passing of her husband 3 years ago.  She had informed both of them that she was "depressed" and "ready to go", and did not want invasive measures to prolong her life.  After lengthy discussion with the son and daughter, the patient was placed on comfort measures and was to be admitted to the hospitalist service, but she passed away prior to leaving the emergency department.  The patient was also evaluated by Dr. Hal Hope who can attest to the DNR/DNI status and comfort measures conversation I had with [name], the son.  CRITICAL CARE Performed by: Veryl Speak Total critical care time: 70 minutes Critical care time was exclusive of separately billable  procedures and treating other patients. Critical care was necessary to treat or prevent imminent or life-threatening deterioration. Critical care was time spent personally by me on the following activities: development of treatment plan with patient and/or surrogate as well as nursing, discussions with consultants, evaluation of patient's response to treatment, examination of patient, obtaining history from patient or surrogate, ordering and performing treatments and interventions, ordering and review of laboratory studies, ordering and review of radiographic studies, pulse oximetry and re-evaluation of patient's condition.   Final Clinical Impressions(s) / ED Diagnoses   Final diagnoses:  None    ED Discharge Orders    None       Veryl Speak, MD 10/06/18 [phone]

## 2018-10-04 NOTE — ED Triage Notes (Signed)
Pt comes via EMS from [facility] with SOB. Pt was found at 68% on RA, placed on 2L [place], reached 98%, then placed on 6L with EMS and began to regain color better. Covid test last month was negative. Just finsihed antibiotics for UTI. VS with EMS 152/97, HR 90's-130s. Hx of A-Fib.

## 2018-10-05 ENCOUNTER — Encounter (HOSPITAL_COMMUNITY): Payer: Self-pay | Admitting: Internal Medicine

## 2018-10-05 DIAGNOSIS — M19041 Primary osteoarthritis, right hand: Secondary | ICD-10-CM | POA: Diagnosis present

## 2018-10-05 DIAGNOSIS — Z8051 Family history of malignant neoplasm of kidney: Secondary | ICD-10-CM | POA: Diagnosis not present

## 2018-10-05 DIAGNOSIS — N179 Acute kidney failure, unspecified: Secondary | ICD-10-CM | POA: Diagnosis present

## 2018-10-05 DIAGNOSIS — E785 Hyperlipidemia, unspecified: Secondary | ICD-10-CM | POA: Diagnosis present

## 2018-10-05 DIAGNOSIS — E039 Hypothyroidism, unspecified: Secondary | ICD-10-CM | POA: Diagnosis present

## 2018-10-05 DIAGNOSIS — U071 COVID-19: Secondary | ICD-10-CM | POA: Diagnosis present

## 2018-10-05 DIAGNOSIS — Z803 Family history of malignant neoplasm of breast: Secondary | ICD-10-CM | POA: Diagnosis not present

## 2018-10-05 DIAGNOSIS — Z8042 Family history of malignant neoplasm of prostate: Secondary | ICD-10-CM | POA: Diagnosis not present

## 2018-10-05 DIAGNOSIS — J9601 Acute respiratory failure with hypoxia: Secondary | ICD-10-CM | POA: Diagnosis present

## 2018-10-05 DIAGNOSIS — R0602 Shortness of breath: Secondary | ICD-10-CM | POA: Diagnosis present

## 2018-10-05 DIAGNOSIS — I480 Paroxysmal atrial fibrillation: Secondary | ICD-10-CM | POA: Diagnosis present

## 2018-10-05 DIAGNOSIS — N183 Chronic kidney disease, stage 3 (moderate): Secondary | ICD-10-CM | POA: Diagnosis present

## 2018-10-05 DIAGNOSIS — Z66 Do not resuscitate: Secondary | ICD-10-CM | POA: Diagnosis present

## 2018-10-05 DIAGNOSIS — H9190 Unspecified hearing loss, unspecified ear: Secondary | ICD-10-CM | POA: Diagnosis present

## 2018-10-05 DIAGNOSIS — Z515 Encounter for palliative care: Secondary | ICD-10-CM | POA: Diagnosis present

## 2018-10-05 DIAGNOSIS — Z79890 Hormone replacement therapy (postmenopausal): Secondary | ICD-10-CM | POA: Diagnosis not present

## 2018-10-05 DIAGNOSIS — I129 Hypertensive chronic kidney disease with stage 1 through stage 4 chronic kidney disease, or unspecified chronic kidney disease: Secondary | ICD-10-CM | POA: Diagnosis present

## 2018-10-05 DIAGNOSIS — J1289 Other viral pneumonia: Secondary | ICD-10-CM | POA: Diagnosis present

## 2018-10-05 DIAGNOSIS — M19042 Primary osteoarthritis, left hand: Secondary | ICD-10-CM | POA: Diagnosis present

## 2018-10-05 DIAGNOSIS — G9341 Metabolic encephalopathy: Secondary | ICD-10-CM | POA: Diagnosis present

## 2018-10-05 DIAGNOSIS — Z8249 Family history of ischemic heart disease and other diseases of the circulatory system: Secondary | ICD-10-CM | POA: Diagnosis not present

## 2018-10-05 DIAGNOSIS — H409 Unspecified glaucoma: Secondary | ICD-10-CM | POA: Diagnosis present

## 2018-10-05 DIAGNOSIS — Z7982 Long term (current) use of aspirin: Secondary | ICD-10-CM | POA: Diagnosis not present

## 2018-10-05 DIAGNOSIS — E872 Acidosis: Secondary | ICD-10-CM | POA: Diagnosis present

## 2018-10-05 DIAGNOSIS — C9000 Multiple myeloma not having achieved remission: Secondary | ICD-10-CM | POA: Diagnosis present

## 2018-10-05 LAB — URINALYSIS, ROUTINE W REFLEX MICROSCOPIC
Bilirubin Urine: NEGATIVE
Glucose, UA: NEGATIVE mg/dL
Ketones, ur: NEGATIVE mg/dL
Nitrite: NEGATIVE
Protein, ur: 30 mg/dL — AB
Specific Gravity, Urine: 1.02 (ref 1.005–1.030)
WBC, UA: >50 WBC/hpf — ABNORMAL HIGH (ref 0–5)
pH: 5 (ref 5.0–8.0)

## 2018-10-05 LAB — CBC WITH DIFFERENTIAL/PLATELET
Abs Immature Granulocytes: 0.31 10*3/uL — ABNORMAL HIGH (ref 0.00–0.07)
Basophils Absolute: 0 10*3/uL (ref 0.0–0.1)
Basophils Relative: 0 %
Eosinophils Absolute: 0 10*3/uL (ref 0.0–0.5)
Eosinophils Relative: 0 %
HCT: 34.3 % — ABNORMAL LOW (ref 36.0–46.0)
Hemoglobin: 9.4 g/dL — ABNORMAL LOW (ref 12.0–15.0)
Immature Granulocytes: 1 %
Lymphocytes Relative: 7 %
Lymphs Abs: 1.8 10*3/uL (ref 0.7–4.0)
MCH: 25.5 pg — ABNORMAL LOW (ref 26.0–34.0)
MCHC: 27.4 g/dL — ABNORMAL LOW (ref 30.0–36.0)
MCV: 93.2 fL (ref 80.0–100.0)
Monocytes Absolute: 1.1 10*3/uL — ABNORMAL HIGH (ref 0.1–1.0)
Monocytes Relative: 4 %
Neutro Abs: 24.4 10*3/uL — ABNORMAL HIGH (ref 1.7–7.7)
Neutrophils Relative %: 88 %
Platelets: 410 10*3/uL — ABNORMAL HIGH (ref 150–400)
RBC: 3.68 MIL/uL — ABNORMAL LOW (ref 3.87–5.11)
RDW: 22.5 % — ABNORMAL HIGH (ref 11.5–15.5)
WBC: 27.8 10*3/uL — ABNORMAL HIGH (ref 4.0–10.5)
nRBC: 0.4 % — ABNORMAL HIGH (ref 0.0–0.2)

## 2018-10-05 LAB — POCT I-STAT 7, (LYTES, BLD GAS, ICA,H+H)
Acid-base deficit: 22 mmol/L — ABNORMAL HIGH (ref 0.0–2.0)
Bicarbonate: 5.9 mmol/L — ABNORMAL LOW (ref 20.0–28.0)
Calcium, Ion: 1.13 mmol/L — ABNORMAL LOW (ref 1.15–1.40)
HCT: 32 % — ABNORMAL LOW (ref 36.0–46.0)
Hemoglobin: 10.9 g/dL — ABNORMAL LOW (ref 12.0–15.0)
O2 Saturation: 98 %
Potassium: 5.9 mmol/L — ABNORMAL HIGH (ref 3.5–5.1)
Sodium: 139 mmol/L (ref 135–145)
TCO2: 6 mmol/L — ABNORMAL LOW (ref 22–32)
pCO2 arterial: 18.1 mmHg — CL (ref 32.0–48.0)
pH, Arterial: 7.121 — CL (ref 7.350–7.450)
pO2, Arterial: 136 mmHg — ABNORMAL HIGH (ref 83.0–108.0)

## 2018-10-05 LAB — LACTIC ACID, PLASMA: Lactic Acid, Venous: >11 mmol/L (ref 0.5–1.9)

## 2018-10-05 LAB — COMPREHENSIVE METABOLIC PANEL
ALT: 16 U/L (ref 0–44)
AST: 34 U/L (ref 15–41)
Albumin: 2.1 g/dL — ABNORMAL LOW (ref 3.5–5.0)
Alkaline Phosphatase: 130 U/L — ABNORMAL HIGH (ref 38–126)
BUN: 31 mg/dL — ABNORMAL HIGH (ref 8–23)
CO2: <7 mmol/L — ABNORMAL LOW (ref 22–32)
Calcium: 8.6 mg/dL — ABNORMAL LOW (ref 8.9–10.3)
Chloride: 108 mmol/L (ref 98–111)
Creatinine, Ser: 2.26 mg/dL — ABNORMAL HIGH (ref 0.44–1.00)
GFR calc Af Amer: 22 mL/min — ABNORMAL LOW (ref >60)
GFR calc non Af Amer: 19 mL/min — ABNORMAL LOW (ref >60)
Glucose, Bld: 233 mg/dL — ABNORMAL HIGH (ref 70–99)
Potassium: 6.3 mmol/L (ref 3.5–5.1)
Sodium: 139 mmol/L (ref 135–145)
Total Bilirubin: 1.3 mg/dL — ABNORMAL HIGH (ref 0.3–1.2)
Total Protein: 7.2 g/dL (ref 6.5–8.1)

## 2018-10-05 LAB — BRAIN NATRIURETIC PEPTIDE: B Natriuretic Peptide: 3558 pg/mL — ABNORMAL HIGH (ref 0.0–100.0)

## 2018-10-05 LAB — TROPONIN I (HIGH SENSITIVITY): Troponin I (High Sensitivity): 162 ng/L (ref <18)

## 2018-10-05 LAB — SARS CORONAVIRUS 2 BY RT PCR (HOSPITAL ORDER, PERFORMED IN ~~LOC~~ HOSPITAL LAB): SARS Coronavirus 2: POSITIVE — AB

## 2018-10-05 MED ORDER — ONDANSETRON HCL 4 MG PO TABS: 4.0000 mg | ORAL_TABLET | Freq: Four times a day (QID) | ORAL | Status: DC | PRN

## 2018-10-05 MED ORDER — ACETAMINOPHEN 650 MG RE SUPP: 650.0000 mg | Freq: Four times a day (QID) | RECTAL | Status: DC | PRN

## 2018-10-05 MED ORDER — FUROSEMIDE 10 MG/ML IJ SOLN
40.0000 mg | Freq: Once | INTRAMUSCULAR | Status: AC
  Administered 2018-10-05: 01:00:00 40 mg via INTRAVENOUS
  Filled 2018-10-05: qty 4

## 2018-10-05 MED ORDER — LORAZEPAM 2 MG/ML IJ SOLN: 1.0000 mg | INTRAMUSCULAR | Status: DC | PRN

## 2018-10-05 MED ORDER — SODIUM CHLORIDE 0.9 % IV BOLUS
1000.0000 mL | Freq: Once | INTRAVENOUS | Status: AC
  Administered 2018-10-05: 03:00:00 1000 mL via INTRAVENOUS

## 2018-10-05 MED ORDER — VANCOMYCIN HCL IN DEXTROSE 1-5 GM/200ML-% IV SOLN
1000.0000 mg | Freq: Once | INTRAVENOUS | Status: AC
  Administered 2018-10-05: 03:00:00 1000 mg via INTRAVENOUS
  Filled 2018-10-05: qty 200

## 2018-10-05 MED ORDER — MORPHINE 100MG IN NS 100ML (1MG/ML) PREMIX INFUSION
5.0000 mg/h | INTRAVENOUS | Status: DC
  Administered 2018-10-05: 04:00:00 5 mg/h via INTRAVENOUS
  Filled 2018-10-05: qty 100

## 2018-10-05 MED ORDER — ONDANSETRON HCL 4 MG/2ML IJ SOLN: 4.0000 mg | Freq: Four times a day (QID) | INTRAMUSCULAR | Status: DC | PRN

## 2018-10-05 MED ORDER — SODIUM CHLORIDE 0.9 % IV SOLN
2.0000 g | Freq: Once | INTRAVENOUS | Status: AC
  Administered 2018-10-05: 03:00:00 2 g via INTRAVENOUS
  Filled 2018-10-05: qty 2

## 2018-10-05 MED ORDER — ACETAMINOPHEN 325 MG PO TABS: 650.0000 mg | ORAL_TABLET | Freq: Four times a day (QID) | ORAL | Status: DC | PRN

## 2018-10-06 LAB — BLOOD CULTURE ID PANEL (REFLEXED)

## 2018-10-06 LAB — URINE CULTURE

## 2018-10-08 LAB — CULTURE, BLOOD (ROUTINE X 2): Special Requests: ADEQUATE

## 2018-10-10 LAB — CULTURE, BLOOD (ROUTINE X 2): Culture: NO GROWTH

## 2018-10-21 ENCOUNTER — Telehealth: Payer: Self-pay | Admitting: Internal Medicine

## 2018-10-21 NOTE — Telephone Encounter (Signed)
I called and left a message for patient's daughter [name]

## 2018-10-24 NOTE — ED Notes (Signed)
Pt upon arrival was difficult to obtain access initially. IV Team consults were placed and access was obtained. Upon arrival pt was on 6L [place] and after difficulty obtaining O2 saturation with readings in the 60s-70s, she was placed on a non-rebreather shortly after. Pt is still currently on a non-rebreather with O2 saturations in the 80s. Given 40 mg of Lasix with no urine output as of right now. Provider is aware.

## 2018-10-24 NOTE — ED Notes (Signed)
Pts son at bedside, he continues to agree with the plan of care, labored breathing noted, pt continues to be on NRB, intermittently pulling on lines, nonverbal and not following commands.  Morphine drip started.

## 2018-10-24 NOTE — Progress Notes (Signed)
ABG drawn, critical results given to ED MD.

## 2018-10-24 NOTE — H&P (Signed)
 History and Physical    Theresa Lynch MRN:[id number] DOB: 12/05/1929 DOA: 09/24/2018  PCP: Linthavong, Kanhka, MD  Patient coming from: Skilled nursing facility.  History obtained from patient's son and ER physician and previous records.  Chief Complaint: Shortness of breath.  HPI: Theresa Lynch is a 85 y.o. female with recent admitted for COVID pneumonia and GI bleed with history of A. fib multiple myeloma was improving as per patient's son but last evening start developing sudden onset of shortness of breath and was brought to the ER.  Patient has not had any nausea vomiting diarrhea recently however did not complain of any chest pain no fever or chills.  ED Course: In the ER patient was hypoxic initially talking but soon started declining becoming encephalopathic hypotensive hypoxic.  Chest x-ray showing multifocal pneumonia.  Labs showing worsening renal function with hyperkalemia and metabolic acidosis.  COVID-19 test was positive.  At this point patient son and family consented to the patient to be made comfort measures only and patient was started on morphine drip.  Review of Systems: As per HPI, rest all negative.   Past Medical History:  Diagnosis Date  . Arthritis    hands  . Cataract   . Claustrophobia   . Depression    with anxiety/h/o agoraphobia  . Glaucoma   . Hip fracture (HCC)    left hip  . HOH (hard of hearing)   . Hyperlipidemia   . Hypertension   . Multiple myeloma (HCC)   . PONV (postoperative nausea and vomiting)     Past Surgical History:  Procedure Laterality Date  . CATARACT EXTRACTION W/PHACO Right 03/15/2015   Procedure: CATARACT EXTRACTION PHACO AND INTRAOCULAR LENS PLACEMENT (IOC);  Surgeon: Anita Prakash Vin-Parikh, MD;  Location: [facility];  Service: Ophthalmology;  Laterality: Right;  . HIP SURGERY    . INTRAMEDULLARY (IM) NAIL INTERTROCHANTERIC Left 05/25/2017   Procedure: INTRAMEDULLARY (IM) NAIL INTERTROCHANTRIC;   Surgeon: Krasinski, Kevin, MD;  Location: [facility];  Service: Orthopedics;  Laterality: Left;  . ROTATOR CUFF REPAIR  1999   bilateral     reports that she has never smoked. She has never used smokeless tobacco. She reports current alcohol use of about 7.0 standard drinks of alcohol per week. She reports that she does not use drugs.  Allergies  Allergen Reactions  . Codeine Nausea And Vomiting  . Penicillins Swelling and Other (See Comments)    Lips swell Has patient had a PCN reaction causing immediate rash, facial/tongue/throat swelling, SOB or lightheadedness with hypotension: Yes Has patient had a PCN reaction causing severe rash involving mucus membranes or skin necrosis: No Has patient had a PCN reaction that required hospitalization: No Has patient had a PCN reaction occurring within the last 10 years: No If all of the above answers are "NO", then may proceed with Cephalosporin use.     Family History  Problem Relation Age of Onset  . Cancer - Other Son        appendix cancer  . Prostate cancer Brother   . CAD Brother   . Kidney cancer Father   . Breast cancer Sister   . Stroke Sister     Prior to Admission medications   Medication Sig Start Date End Date Taking? Authorizing Provider  aspirin EC 81 MG tablet Take 1 tablet (81 mg total) by mouth daily. Please start in 1 week if Hgb stable 09/18/18   Elgergawy, Dawood S, MD  atorvastatin (LIPITOR) 10 MG tablet   Take 5 mg by mouth daily.     [provider]  calcium gluconate 500 MG tablet Take 1 tablet by mouth 2 (two) times daily.    [provider]  cholecalciferol (VITAMIN D3) 25 MCG (1000 UT) tablet Take 2,000 Units by mouth 2 (two) times daily.     [provider]  feeding supplement, ENSURE ENLIVE, (ENSURE ENLIVE) LIQD Take 237 mLs by mouth 2 (two) times daily between meals. 09/11/18   Elgergawy, Dawood S, MD  lenalidomide (REVLIMID) 5 MG capsule Take 1 capsule (5 mg total) by mouth daily. Take  for 14 days on, then 7 days off. 08/01/18   Brahmanday, Govinda R, MD  levothyroxine (SYNTHROID) 50 MCG tablet Take 1 tablet by mouth daily. 07/09/18   [provider]  metoprolol tartrate (LOPRESSOR) 25 MG tablet Take 1 tablet (25 mg total) by mouth 2 (two) times daily. 09/11/18   Elgergawy, Dawood S, MD  pantoprazole (PROTONIX) 40 MG tablet Take 1 tablet (40 mg total) by mouth 2 (two) times daily. 09/11/18   Elgergawy, Dawood S, MD  PARoxetine (PAXIL) 40 MG tablet Take 40 mg by mouth at bedtime. 09/05/12   Love, Pamela S, PA-C  polyethylene glycol (MIRALAX / GLYCOLAX) packet Take 17 g by mouth daily. 06/01/17   Kalisetti, Radhika, MD  traZODone (DESYREL) 50 MG tablet Take 25 mg by mouth at bedtime.     [provider]  vitamin B-12 (CYANOCOBALAMIN) 1000 MCG tablet Take 1,000 mcg by mouth daily.    [provider]    Physical Exam: Constitutional: Patient is moderately built and nourished. Vitals:   09/26/2018 0634 10/07/2018 0637 10/22/2018 0642 10/10/2018 0649  BP:      Pulse:    (!) 26  Resp: 17 16 15 15  Temp:      TempSrc:      SpO2:    (!) 80%   Eyes: Anicteric no pallor. ENMT: No discharge from the ears eyes nose or mouth. Neck: No mass felt.  No neck rigidity. Respiratory: No rhonchi or crepitations. Cardiovascular: S1-S2 heard. Abdomen: Soft nontender bowel sounds present. Musculoskeletal: No edema. Skin: No rash. Neurologic: Lethargic encephalopathic. Psychiatric: Encephalopathic.   Labs on Admission: I have personally reviewed following labs and imaging studies  CBC: Recent Labs  Lab 09/28/2018 0058 10/17/2018 0100  WBC 27.8*  --   NEUTROABS 24.4*  --   HGB 9.4* 10.9*  HCT 34.3* 32.0*  MCV 93.2  --   PLT 410*  --    Basic Metabolic Panel: Recent Labs  Lab 09/27/2018 0058 09/24/2018 0100  NA 139 139  K 6.3* 5.9*  CL 108  --   CO2 <7*  --   GLUCOSE 233*  --   BUN 31*  --   CREATININE 2.26*  --   CALCIUM 8.6*  --    GFR: CrCl cannot be  calculated (Unknown ideal weight.). Liver Function Tests: Recent Labs  Lab 10/03/2018 0058  AST 34  ALT 16  ALKPHOS 130*  BILITOT 1.3*  PROT 7.2  ALBUMIN 2.1*   No results for input(s): LIPASE, AMYLASE in the last 168 hours. No results for input(s): AMMONIA in the last 168 hours. Coagulation Profile: No results for input(s): INR, PROTIME in the last 168 hours. Cardiac Enzymes: No results for input(s): CKTOTAL, CKMB, CKMBINDEX, TROPONINI in the last 168 hours. BNP (last 3 results) No results for input(s): PROBNP in the last 8760 hours. HbA1C: No results for input(s): HGBA1C in the last 72   hours. CBG: No results for input(s): GLUCAP in the last 168 hours. Lipid Profile: No results for input(s): CHOL, HDL, LDLCALC, TRIG, CHOLHDL, LDLDIRECT in the last 72 hours. Thyroid Function Tests: No results for input(s): TSH, T4TOTAL, FREET4, T3FREE, THYROIDAB in the last 72 hours. Anemia Panel: No results for input(s): VITAMINB12, FOLATE, FERRITIN, TIBC, IRON, RETICCTPCT in the last 72 hours. Urine analysis:    Component Value Date/Time   COLORURINE AMBER (A) 2018/10/31 0349   APPEARANCEUR HAZY (A) 2018-10-31 0349   LABSPEC 1.020 2018-10-31 0349   PHURINE 5.0 October 31, 2018 0349   GLUCOSEU NEGATIVE 10/31/18 0349   HGBUR LARGE (A) October 31, 2018 0349   BILIRUBINUR NEGATIVE 2018/10/31 0349   KETONESUR NEGATIVE 10/31/2018 0349   PROTEINUR 30 (A) Oct 31, 2018 0349   UROBILINOGEN 1.0 08/20/2012 2305   NITRITE NEGATIVE 31-Oct-2018 0349   LEUKOCYTESUR LARGE (A) 10-31-2018 0349   Sepsis Labs: _0 (procalcitonin:4,lacticidven:4) ) Recent Results (from the past 240 hour(s))  SARS Coronavirus 2 [facility] order, Performed in [facility] hospital lab) Nasopharyngeal Nasopharyngeal Swab     Status: Abnormal   Collection Time: 10/31/18 12:15 AM   Specimen: Nasopharyngeal Swab  Result Value Ref Range Status   SARS Coronavirus 2 POSITIVE (A) NEGATIVE Final    Comment: RESULT CALLED TO, READ BACK  BY AND VERIFIED WITH: DENNIS A, RN AT 0346 ON 2018/10/31 BY SAINVILUS S (NOTE) If result is NEGATIVE SARS-CoV-2 target nucleic acids are NOT DETECTED. The SARS-CoV-2 RNA is generally detectable in upper and lower  respiratory specimens during the acute phase of infection. The lowest  concentration of SARS-CoV-2 viral copies this assay can detect is 250  copies / mL. A negative result does not preclude SARS-CoV-2 infection  and should not be used as the sole basis for treatment or other  patient management decisions.  A negative result may occur with  improper specimen collection / handling, submission of specimen other  than nasopharyngeal swab, presence of viral mutation(s) within the  areas targeted by this assay, and inadequate number of viral copies  (<250 copies / mL). A negative result must be combined with clinical  observations, patient history, and epidemiological information. If result is POSITIVE SARS-CoV-2 target nucleic acids are  DETECTED. The SARS-CoV-2 RNA is generally detectable in upper and lower  respiratory specimens during the acute phase of infection.  Positive  results are indicative of active infection with SARS-CoV-2.  Clinical  correlation with patient history and other diagnostic information is  necessary to determine patient infection status.  Positive results do  not rule out bacterial infection or co-infection with other viruses. If result is PRESUMPTIVE POSTIVE SARS-CoV-2 nucleic acids MAY BE PRESENT.   A presumptive positive result was obtained on the submitted specimen  and confirmed on repeat testing.  While 2019 novel coronavirus  (SARS-CoV-2) nucleic acids may be present in the submitted sample  additional confirmatory testing may be necessary for epidemiological  and / or clinical management purposes  to differentiate between  SARS-CoV-2 and other Sarbecovirus currently known to infect humans.  If clinically indicated additional testing with an  alternate test  methodology  [id number]) is advised. The SARS-CoV-2 RNA is generally  detectable in upper and lower respiratory specimens during the acute  phase of infection. The expected result is Negative. Fact Sheet for Patients:  StrictlyIdeas.no Fact Sheet for Healthcare Providers: BankingDealers.co.za This test is not yet approved or cleared by the [place] FDA and has been authorized for detection and/or diagnosis of SARS-CoV-2 by FDA under an Emergency Use  Authorization (EUA).  This EUA will remain in effect (meaning this test can be used) for the duration of the COVID-19 declaration under Section 564(b)(1) of the Act, 21 U.S.C. section 360bbb-3(b)(1), unless the authorization is terminated or revoked sooner. Performed at [facility], [address] [place], [place] [address]      Radiological Exams on Admission: Dg Chest Port 1 View  Result Date: 10/01/2018 CLINICAL DATA:  Shortness of breath EXAM: PORTABLE CHEST 1 VIEW COMPARISON:  Eight hundred twenty FINDINGS: Small pleural effusions with basilar atelectasis, right greater than left. Multifocal airspace opacities throughout both lungs. No pneumothorax. Remote right rotator cuff repair. IMPRESSION: Small bilateral pleural effusions with multifocal airspace opacities throughout both lungs. This may indicate multifocal infection or pulmonary edema. Electronically Signed   By: Kevin  Herman M.D.   On: 09/24/2018 23:55     Assessment/Plan Principal Problem:   Acute respiratory failure with hypoxia (HCC) Active Problems:   Essential hypertension   COVID-19 virus infection   PAF (paroxysmal atrial fibrillation) (HCC)   AKI (acute kidney injury) (HCC)    1. Acute respiratory failure with hypoxia secondary to multifocal pneumonia with COVID-19 infection. 2. Acute renal failure with hyperkalemia. 3. Acute metabolic encephalopathy. 4. Metabolic acidosis with acute  renal failure. 5. History of multiple myeloma. 6. Hypothyroidism. 7. History of A. Fib.  Plan -at this time after discussing with patient's son who is at the bedside patient son consented to make patient complete comfort measures and patient is started on morphine drip and PRN Ativan. Labs were ordered patient will be DNR.  Patient's family is aware that patient is terminally ill.  Patient's family at this time is requested patient to be admitted to  and not transferred to [facility] [place].  This was also discussed with house coverage nurse.  Given the acute nature of patient's condition and further deterioration likely and could be terminal patient will be inpatient status.   DVT prophylaxis: Patient is on comfort measures. Code Status: DNR. Family Communication: Patient's son. Disposition Plan: To be determined.  Likely terminal. Consults called: Palliative care. Admission status: Inpatient.    N  MD [facility] Pager [phone].  If 7PM-7AM, please contact night-coverage [email or web address] Password [email or web address]  10/08/2018, 6:52 AM     

## 2018-10-24 NOTE — Care Management (Signed)
ED CM notified Anderson Malta RN  DON that patient expired in the ED. Updated Dr. Lorin Mercy.

## 2018-10-24 NOTE — ED Notes (Signed)
No palpable radial pulses at this time, RR slowing significantly with 1-2 second apneic episodes.

## 2018-10-24 NOTE — ED Notes (Signed)
Garlon Hatchet RN spoke with [facility] about pt status

## 2018-10-24 NOTE — ED Notes (Addendum)
No respiration noted, not HR can be heart in 5pts by RN x 2. Rolene Arbour, RN 2nd nurse Dr. Gilford Raid EDP notified

## 2018-10-24 NOTE — ED Notes (Signed)
4-5 second apnea periods between breaths. Unable to obtain BP.

## 2018-10-24 NOTE — Death Summary Note (Signed)
Death Summary  Theresa Lynch MMN:[id number] DOB: 1929/12/18 DOA: October 26, 2018  PCP: [name], MD PCP/Office notified: No  Admit date: 10/26/2018 Date of Death: 2018-10-27  Final Diagnoses:  Principal Problem:   Acute respiratory failure with hypoxia ([place]) Active Problems:   Essential hypertension   COVID-19 virus infection   PAF (paroxysmal atrial fibrillation) ([place])   AKI (acute kidney injury) ([place])    1. Acute respiratory failure secondary to multifocal pneumonia associated with novel coronovirus-19 infection 2. Acute renal failure with hyperkalemia. 3. Acute metabolic encephalopathy. 4. Metabolic acidosis with acute renal failure. 5. History of multiple myeloma. 6. Hypothyroidism. 7. History of A. Fib.  History of present illness:  Patient was previously admitted to [facility] from 8/9-21 for COVID-19-associated pneumonia (COVID-19 positive on 8/8; treated with Dexamethasone and Remdesivir) and suspected upper GI bleeding.  She had a h/o afib and multiple myeloma.  She reportedly had a negative COVID-19 test (I don't need see this reported in Epic) and was discharged to [facility].  She developed acute SOB overnight and was brought to the ER.  Hospital Course:  The patient was hypoxic upon arrival and communicative.  While in the ER, she became encephalopathic and hypotensive.  CXR showed multifocal PNA.  Labs showed worsening metabolic acidosis and hyperkalemia.  Repeat COVID-19 testing was positive.  After discussion with the patient's son, she was transitioned to comfort measures and started on a morphine drip. She died peacefully at 47AM.   Time: 0733  Signed:  Karmen Bongo  [facility] 10/27/18, 7:53 AM

## 2018-10-24 NOTE — ED Notes (Addendum)
Assumed care of pt, pt non responsive to commands, eyes open, intermittently moving arms. Respirations shallow and labored,

## 2018-10-24 NOTE — ED Notes (Signed)
Pt status declining, pt becoming more restless and complaining of worsening shortness of breath, oxygen saturations maintaining in the 70%s, pt transitioned to NRB mask, IV access obtained unable to draw blood at this time. This nurse asked for assistance from [facility] to obtain another IV access, [place] NT and [employer] EMT assisting with attempting lab draw at this time as well pt arrived with MOST form that stated no intubation or CPR. Dr. Stark Jock brought to bedside to inform him of pt decline in status. Myself, Juan Quam, and Dr. Stark Jock at bedside. Deneise Lever RN clarified patients wishes to decline intubation if needed. pt verbally expressed at that time that she would want a "breathing tube" if it was needed today, but declined CPR if needed. Airway cart, Crash cart, Glidescope, ventilator, and RSI kit delivered to bedside.

## 2018-10-24 NOTE — ED Notes (Signed)
ABG review with EDP by RT No further orders at this time.

## 2018-10-24 NOTE — ED Notes (Signed)
Provider spoke with family and pt is now on comfort care

## 2018-10-24 NOTE — ED Notes (Signed)
Spoke with rapid response to inform them of patient status at this time.

## 2018-10-24 NOTE — Progress Notes (Signed)
Accompanied doctor to tell Ms. Allshouse's son of her passing in Consult Room B.  Offered spiritual presence and listening ear to Mr. Wendland who did not require any other spiritual care.  Mr. Rousey left soon after receiving the news of his mother's passing.    He was able to give funeral home information which was passed on to the nurse.  He also inquired about a necklace his mother was wearing.

## 2018-10-24 DEATH — deceased

## 2019-11-21 IMAGING — CT CT ABDOMEN AND PELVIS WITHOUT CONTRAST
2 of 4 series · 15 of 46 positions shown, 17 images · non-contrast
Comparison: None.

CLINICAL DATA: Abdominal pain.

EXAM:
CT ABDOMEN AND PELVIS WITHOUT CONTRAST
TECHNIQUE: Multidetector CT imaging of the abdomen and pelvis was performed
following the standard protocol without IV contrast.

[Series 2: routine abd/pel wo · axial · 0.76mm/px · z∈[-548,-163]mm · 12 of 89 slices shown, 14 images]
[im 8/89  soft-tissue]
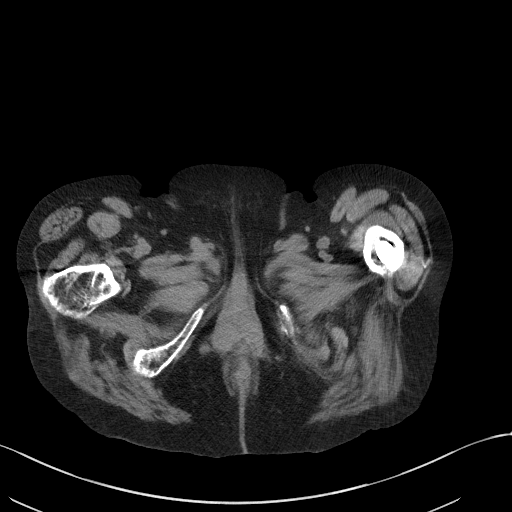
[im 8/89  bone]
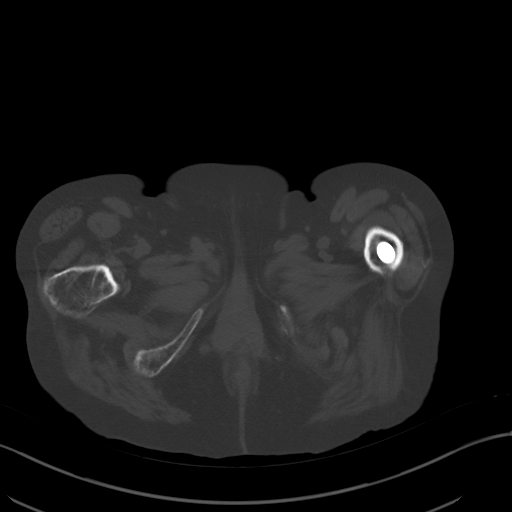
[im 15/89  soft-tissue]
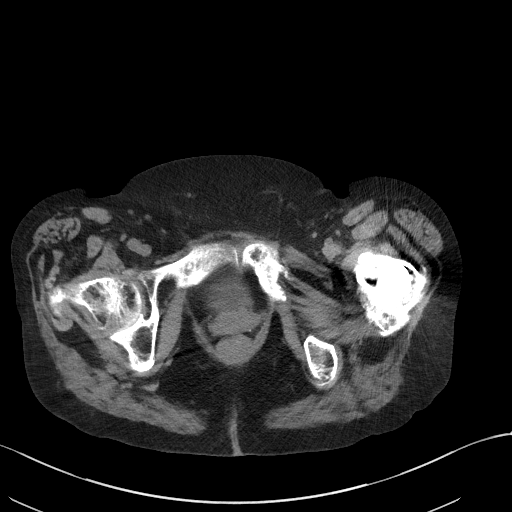
[im 22/89  soft-tissue]
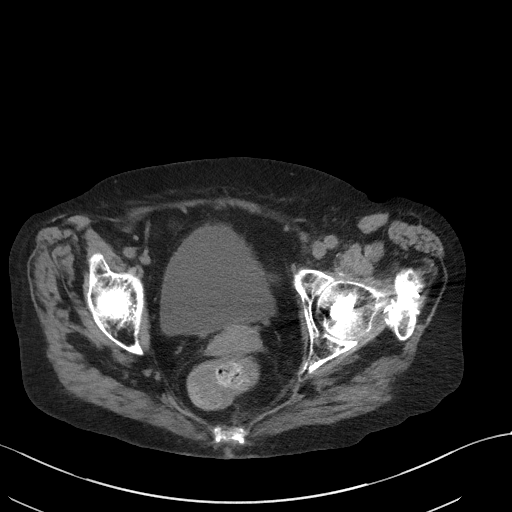
[im 29/89  soft-tissue]
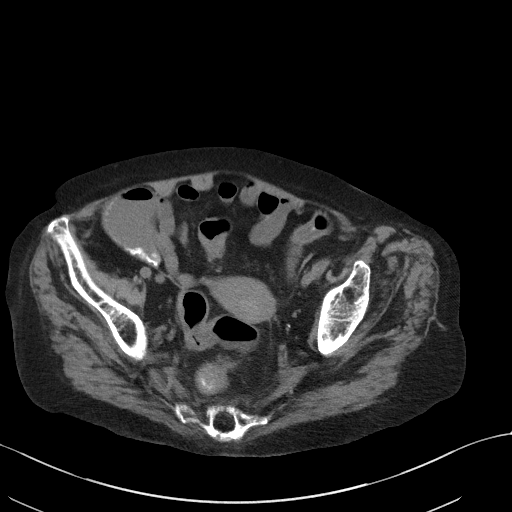
[im 36/89  soft-tissue]
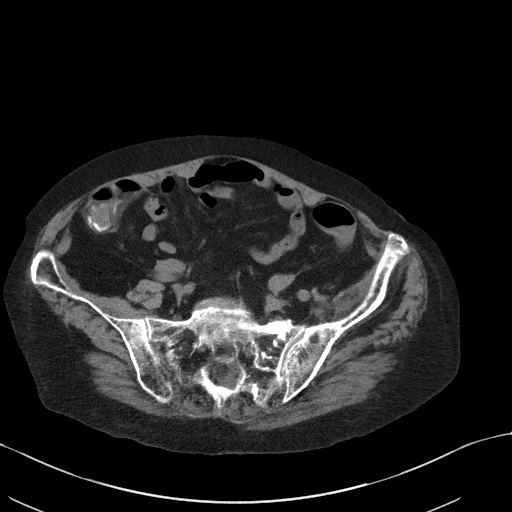
[im 43/89  soft-tissue]
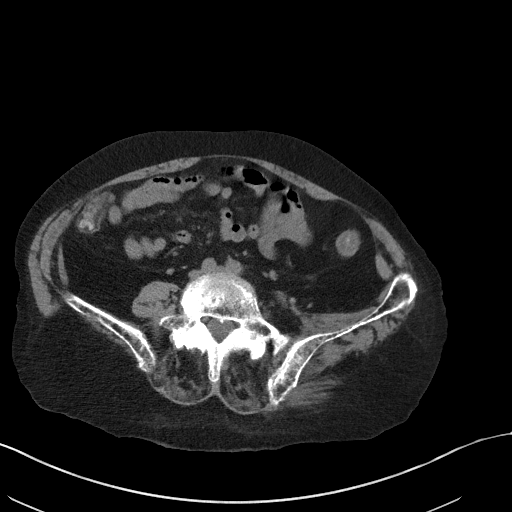
[im 50/89  soft-tissue]
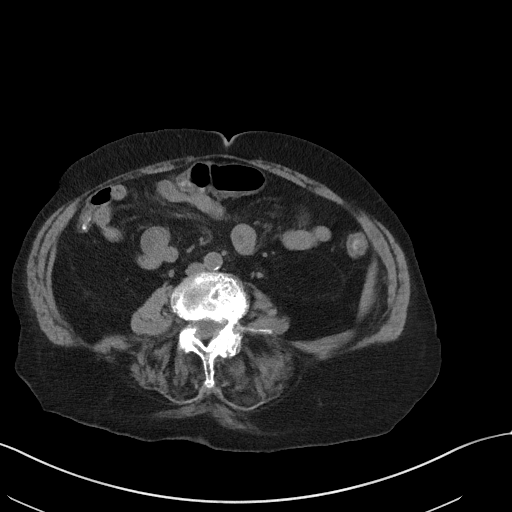
[im 57/89  soft-tissue]
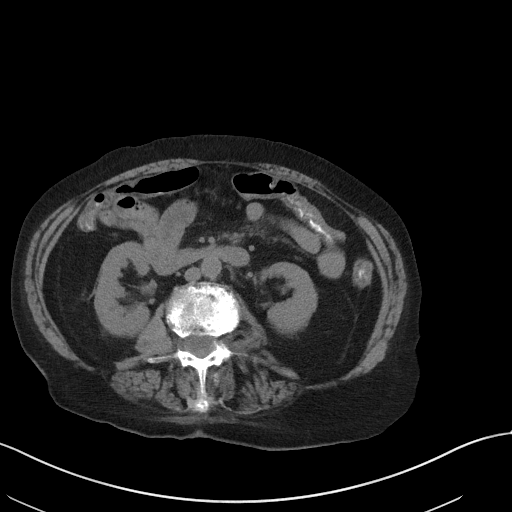
[im 64/89  soft-tissue]
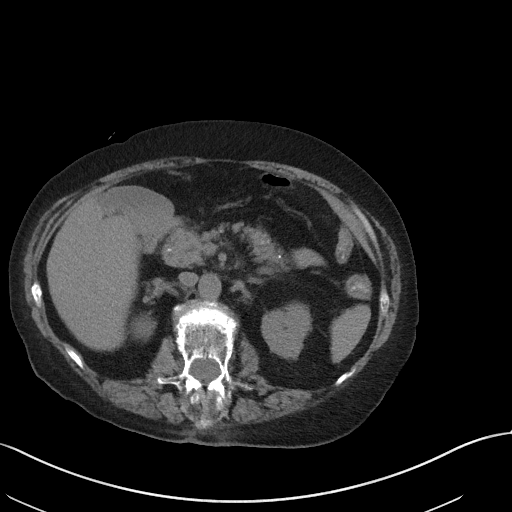
[im 64/89  bone]
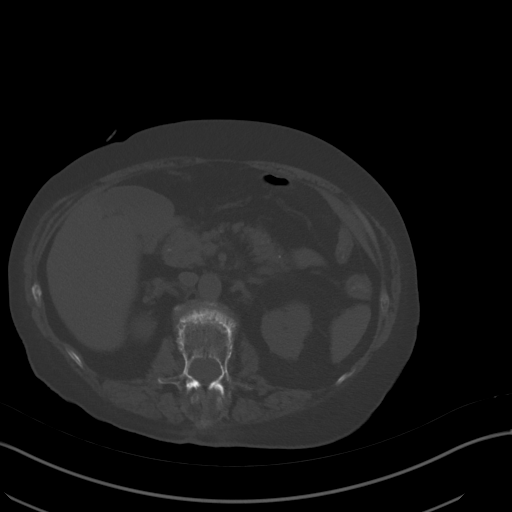
[im 71/89  soft-tissue]
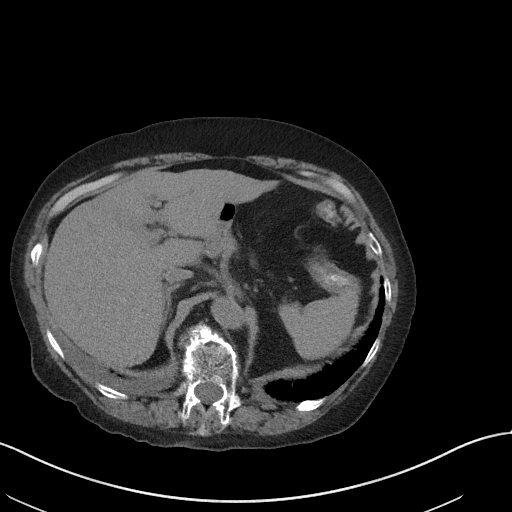
[im 78/89  soft-tissue]
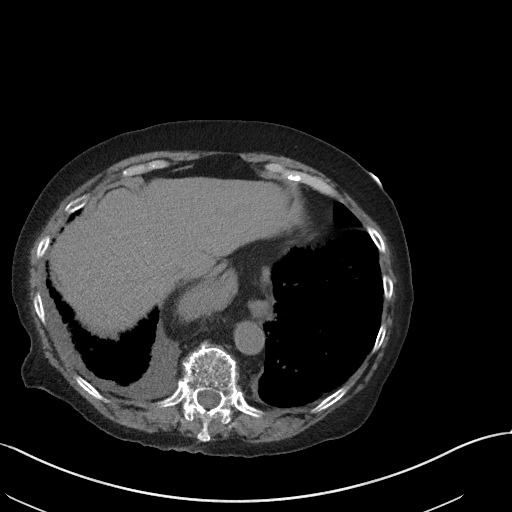
[im 85/89  soft-tissue]
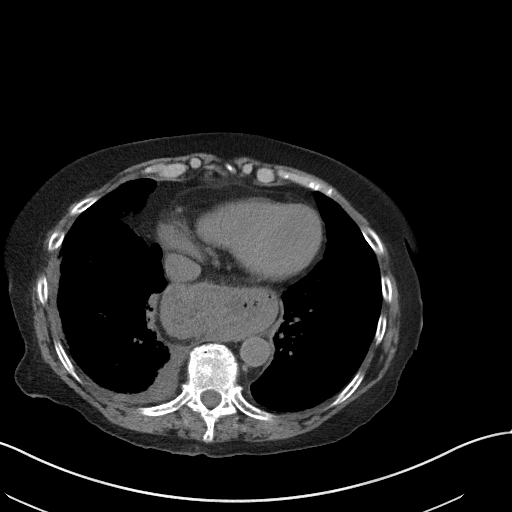

[Series 5: coronal st · coronal · 0.65mm/px · 3 of 87 slices shown]
[im 29/87  soft-tissue]
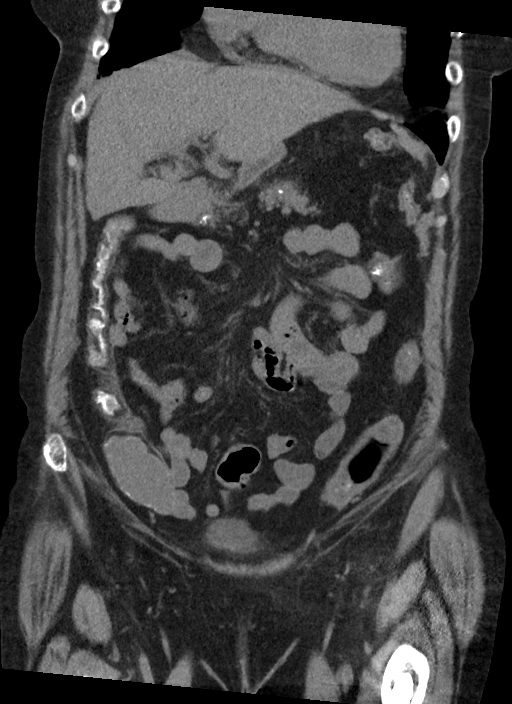
[im 39/87  soft-tissue]
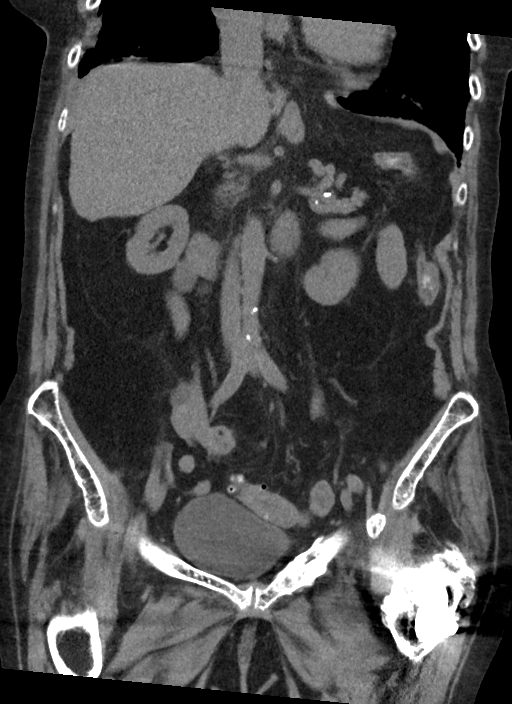
[im 48/87  soft-tissue]
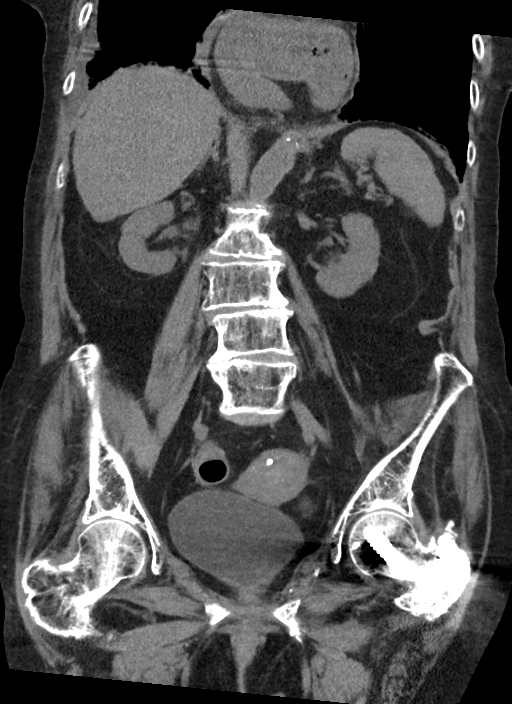

[15 of 46 positions shown; findings below may reference images not displayed]

FINDINGS: Lower chest: There is a small right and a trace left pleural
effusion. There are diffuse bilateral ground-glass airspace
opacities, most notable involving the right lower lobe. There is
some interlobular septal thickening involving the right lower
lobes.The heart size is normal.

Hepatobiliary: The liver is normal. The gallbladder is folded. There
may be some gallbladder sludge. There is no CT evidence for acute
cholecystitis.There is no biliary ductal dilation.

Pancreas: Normal contours without ductal dilatation. No
peripancreatic fluid collection.

Spleen: No splenic laceration or hematoma.

Adrenals/Urinary Tract:

--Adrenal glands: No adrenal hemorrhage.

--Right kidney/ureter: No hydronephrosis or perinephric hematoma.

--Left kidney/ureter: No hydronephrosis or perinephric hematoma.

--Urinary bladder: Unremarkable.

Stomach/Bowel:

--Stomach/Duodenum: There is a large hiatal hernia.

--Small bowel: No dilatation or inflammation.

--Colon: Rectosigmoid diverticulosis without acute inflammation.

--Appendix: Not visualized. No right lower quadrant inflammation or
free fluid.

Vascular/Lymphatic: Atherosclerotic calcification is present within
the non-aneurysmal abdominal aorta, without hemodynamically
significant stenosis.

--No retroperitoneal lymphadenopathy.

--No mesenteric lymphadenopathy.

--No pelvic or inguinal lymphadenopathy.

Reproductive: Unremarkable

Other: No ascites or free air. The abdominal wall is normal.

Musculoskeletal. There is an intramedullary nail in the proximal
left femur. There are old healed or healing fractures of of the left
pubic rami. There is a healed or healing insufficiency fracture of
the left sacrum. There are advanced degenerative changes throughout
the visualized lumbar spine. There is diffuse osteopenia.
IMPRESSION: 1. Diffuse bilateral ground-glass airspace opacities involving the
visualized lung bases. There are trace to small bilateral pleural
effusions, right greater than left. Differential considerations
include pulmonary edema versus an atypical infectious process such
as viral pneumonia.
2. Large hiatal hernia.
3. Probable gallbladder sludge without CT evidence for acute
cholecystitis.
4. Sigmoid diverticulosis without CT evidence for diverticulitis.
5. Multiple healed/healing pelvic fractures as detailed above. No
acute displaced fracture identified on today's exam.

## 2019-12-25 IMAGING — DX DG CHEST 1V PORT
1 series · 1 of 1 positions shown · non-contrast
Comparison: Eight hundred twenty

CLINICAL DATA: Shortness of breath

EXAM:
PORTABLE CHEST 1 VIEW

[chest ap]
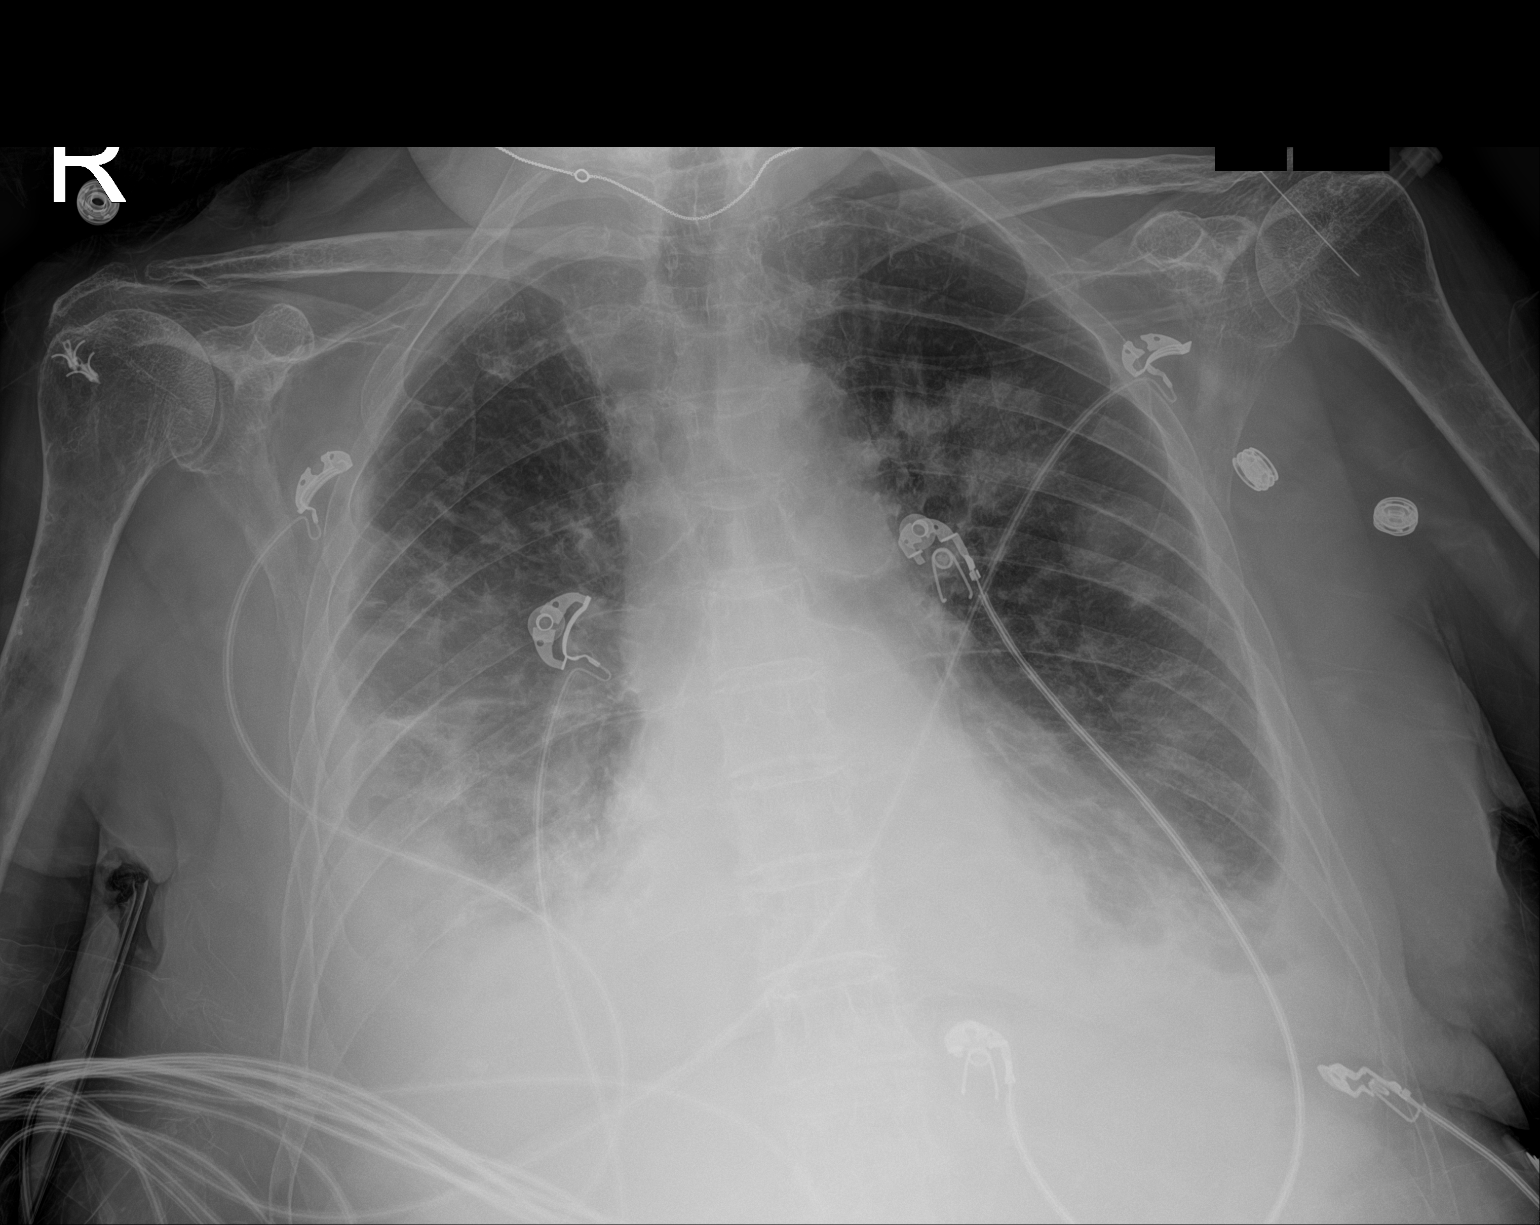

[1 of 1 positions shown; findings below may reference images not displayed]

FINDINGS: Small pleural effusions with basilar atelectasis, right greater than
left. Multifocal airspace opacities throughout both lungs. No
pneumothorax. Remote right rotator cuff repair.
IMPRESSION: Small bilateral pleural effusions with multifocal airspace opacities
throughout both lungs.

This may indicate multifocal infection or pulmonary edema.
